# Patient Record
Sex: Female | Born: 1935 | Race: White | Hispanic: No | Smoking: Never smoker
Health system: Southern US, Community
[De-identification: ages and names within clinical notes are randomized; demographics above are authoritative.]

## PROBLEM LIST (undated history)

## (undated) DIAGNOSIS — R609 Edema, unspecified: Secondary | ICD-10-CM

## (undated) DIAGNOSIS — E785 Hyperlipidemia, unspecified: Secondary | ICD-10-CM

## (undated) DIAGNOSIS — Z66 Do not resuscitate: Secondary | ICD-10-CM

## (undated) DIAGNOSIS — C49A3 Gastrointestinal stromal tumor of small intestine: Secondary | ICD-10-CM

## (undated) DIAGNOSIS — M79604 Pain in right leg: Secondary | ICD-10-CM

## (undated) DIAGNOSIS — F0280 Dementia in other diseases classified elsewhere without behavioral disturbance: Secondary | ICD-10-CM

## (undated) DIAGNOSIS — G459 Transient cerebral ischemic attack, unspecified: Secondary | ICD-10-CM

## (undated) DIAGNOSIS — F0390 Unspecified dementia without behavioral disturbance: Secondary | ICD-10-CM

## (undated) DIAGNOSIS — T1491XA Suicide attempt, initial encounter: Secondary | ICD-10-CM

## (undated) DIAGNOSIS — C801 Malignant (primary) neoplasm, unspecified: Secondary | ICD-10-CM

## (undated) DIAGNOSIS — F329 Major depressive disorder, single episode, unspecified: Secondary | ICD-10-CM

## (undated) DIAGNOSIS — I10 Essential (primary) hypertension: Secondary | ICD-10-CM

## (undated) DIAGNOSIS — G43909 Migraine, unspecified, not intractable, without status migrainosus: Secondary | ICD-10-CM

## (undated) DIAGNOSIS — I951 Orthostatic hypotension: Secondary | ICD-10-CM

## (undated) DIAGNOSIS — IMO0002 Reserved for concepts with insufficient information to code with codable children: Secondary | ICD-10-CM

## (undated) DIAGNOSIS — Z87440 Personal history of urinary (tract) infections: Secondary | ICD-10-CM

## (undated) DIAGNOSIS — R9430 Abnormal result of cardiovascular function study, unspecified: Secondary | ICD-10-CM

## (undated) DIAGNOSIS — G309 Alzheimer's disease, unspecified: Secondary | ICD-10-CM

## (undated) DIAGNOSIS — M1990 Unspecified osteoarthritis, unspecified site: Secondary | ICD-10-CM

## (undated) DIAGNOSIS — K922 Gastrointestinal hemorrhage, unspecified: Secondary | ICD-10-CM

## (undated) DIAGNOSIS — I2510 Atherosclerotic heart disease of native coronary artery without angina pectoris: Secondary | ICD-10-CM

## (undated) DIAGNOSIS — I5189 Other ill-defined heart diseases: Secondary | ICD-10-CM

## (undated) DIAGNOSIS — M79605 Pain in left leg: Secondary | ICD-10-CM

## (undated) HISTORY — DX: Suicide attempt, initial encounter: T14.91XA

## (undated) HISTORY — DX: Dementia in other diseases classified elsewhere, unspecified severity, without behavioral disturbance, psychotic disturbance, mood disturbance, and anxiety: F02.80

## (undated) HISTORY — DX: Unspecified dementia, unspecified severity, without behavioral disturbance, psychotic disturbance, mood disturbance, and anxiety: F03.90

## (undated) HISTORY — PX: ABDOMINAL HYSTERECTOMY: SHX81

## (undated) HISTORY — DX: Gastrointestinal hemorrhage, unspecified: K92.2

## (undated) HISTORY — DX: Hyperlipidemia, unspecified: E78.5

## (undated) HISTORY — DX: Edema, unspecified: R60.9

## (undated) HISTORY — DX: Orthostatic hypotension: I95.1

## (undated) HISTORY — DX: Major depressive disorder, single episode, unspecified: F32.9

## (undated) HISTORY — DX: Reserved for concepts with insufficient information to code with codable children: IMO0002

## (undated) HISTORY — DX: Abnormal result of cardiovascular function study, unspecified: R94.30

## (undated) HISTORY — DX: Unspecified osteoarthritis, unspecified site: M19.90

## (undated) HISTORY — DX: Alzheimer's disease, unspecified: G30.9

## (undated) HISTORY — DX: Migraine, unspecified, not intractable, without status migrainosus: G43.909

## (undated) HISTORY — DX: Transient cerebral ischemic attack, unspecified: G45.9

## (undated) HISTORY — DX: Do not resuscitate: Z66

## (undated) HISTORY — DX: Pain in right leg: M79.604

## (undated) HISTORY — DX: Gastrointestinal stromal tumor of small intestine: C49.A3

## (undated) HISTORY — PX: TONSILLECTOMY: SUR1361

## (undated) HISTORY — DX: Atherosclerotic heart disease of native coronary artery without angina pectoris: I25.10

## (undated) HISTORY — DX: Other ill-defined heart diseases: I51.89

## (undated) HISTORY — DX: Pain in left leg: M79.605

---

## 1983-06-22 DIAGNOSIS — T1491XA Suicide attempt, initial encounter: Secondary | ICD-10-CM

## 1983-06-22 HISTORY — DX: Suicide attempt, initial encounter: T14.91XA

## 1997-09-05 ENCOUNTER — Other Ambulatory Visit: Admission: RE | Admit: 1997-09-05 | Discharge: 1997-09-05 | Payer: Self-pay | Admitting: Gynecology

## 2000-09-20 ENCOUNTER — Encounter: Admission: RE | Admit: 2000-09-20 | Discharge: 2000-09-20 | Payer: Self-pay | Admitting: Family Medicine

## 2000-12-29 HISTORY — PX: DIAGNOSTIC MAMMOGRAM: HXRAD719

## 2008-06-24 ENCOUNTER — Ambulatory Visit (HOSPITAL_COMMUNITY): Admission: RE | Admit: 2008-06-24 | Discharge: 2008-06-24 | Payer: Self-pay | Admitting: Family Medicine

## 2008-07-31 ENCOUNTER — Ambulatory Visit: Payer: Self-pay | Admitting: Diagnostic Radiology

## 2008-07-31 ENCOUNTER — Emergency Department (HOSPITAL_BASED_OUTPATIENT_CLINIC_OR_DEPARTMENT_OTHER): Admission: EM | Admit: 2008-07-31 | Discharge: 2008-08-01 | Payer: Self-pay | Admitting: Emergency Medicine

## 2008-08-01 ENCOUNTER — Ambulatory Visit: Payer: Self-pay | Admitting: Diagnostic Radiology

## 2008-11-20 ENCOUNTER — Ambulatory Visit: Payer: Self-pay | Admitting: Diagnostic Radiology

## 2008-11-20 ENCOUNTER — Encounter: Payer: Self-pay | Admitting: Cardiology

## 2008-11-20 ENCOUNTER — Emergency Department (HOSPITAL_BASED_OUTPATIENT_CLINIC_OR_DEPARTMENT_OTHER): Admission: EM | Admit: 2008-11-20 | Discharge: 2008-11-20 | Payer: Self-pay | Admitting: Emergency Medicine

## 2008-11-21 DIAGNOSIS — D649 Anemia, unspecified: Secondary | ICD-10-CM | POA: Insufficient documentation

## 2008-12-09 ENCOUNTER — Encounter: Admission: RE | Admit: 2008-12-09 | Discharge: 2008-12-09 | Payer: Self-pay | Admitting: Family Medicine

## 2009-01-27 DIAGNOSIS — E559 Vitamin D deficiency, unspecified: Secondary | ICD-10-CM | POA: Insufficient documentation

## 2009-04-08 DIAGNOSIS — F411 Generalized anxiety disorder: Secondary | ICD-10-CM | POA: Insufficient documentation

## 2009-05-22 DIAGNOSIS — M2550 Pain in unspecified joint: Secondary | ICD-10-CM | POA: Insufficient documentation

## 2009-05-22 DIAGNOSIS — F3289 Other specified depressive episodes: Secondary | ICD-10-CM | POA: Insufficient documentation

## 2009-05-22 DIAGNOSIS — R413 Other amnesia: Secondary | ICD-10-CM | POA: Insufficient documentation

## 2009-05-22 DIAGNOSIS — G2581 Restless legs syndrome: Secondary | ICD-10-CM | POA: Insufficient documentation

## 2009-05-22 DIAGNOSIS — F329 Major depressive disorder, single episode, unspecified: Secondary | ICD-10-CM

## 2010-08-08 ENCOUNTER — Encounter: Payer: Self-pay | Admitting: Cardiology

## 2010-08-08 ENCOUNTER — Encounter: Payer: Self-pay | Admitting: Physician Assistant

## 2010-08-09 DIAGNOSIS — R079 Chest pain, unspecified: Secondary | ICD-10-CM

## 2010-08-11 ENCOUNTER — Encounter: Payer: Self-pay | Admitting: Cardiology

## 2010-08-11 DIAGNOSIS — R079 Chest pain, unspecified: Secondary | ICD-10-CM

## 2010-08-12 ENCOUNTER — Encounter: Payer: Self-pay | Admitting: Cardiology

## 2010-08-13 ENCOUNTER — Encounter: Payer: Self-pay | Admitting: Cardiology

## 2010-08-14 ENCOUNTER — Encounter: Payer: Self-pay | Admitting: Cardiology

## 2010-08-24 ENCOUNTER — Encounter: Payer: Self-pay | Admitting: Cardiology

## 2010-08-24 ENCOUNTER — Encounter (INDEPENDENT_AMBULATORY_CARE_PROVIDER_SITE_OTHER): Payer: Medicare Other | Admitting: Cardiology

## 2010-08-24 DIAGNOSIS — R072 Precordial pain: Secondary | ICD-10-CM

## 2010-09-01 NOTE — Medication Information (Signed)
Summary: [facility] D/C MEDICATION SHEET ORDER  [facility] D/C MEDICATION SHEET ORDER   Imported By: Zachary George 08/24/2010 09:35:03  _____________________________________________________________________  External Attachment:    Type:   Image     Comment:   External Document

## 2010-09-01 NOTE — Miscellaneous (Signed)
Summary: Directives and Consents/  POWER OF ATTORNEY  Directives and Consents/  POWER OF ATTORNEY   Imported By: Dorise Hiss 08/24/2010 15:22:20  _____________________________________________________________________  External Attachment:    Type:   Image     Comment:   External Document

## 2010-09-01 NOTE — Letter (Signed)
Summary: [facility] D/C DR. Wilburt Finlay  [facility] D/C DR. Wilburt Finlay   Imported By: Zachary George 08/24/2010 08:31:12  _____________________________________________________________________  External Attachment:    Type:   Image     Comment:   External Document

## 2010-09-01 NOTE — Letter (Signed)
Summary: Internal Other/  PATIENT HISTORY FORM  Internal Other/  PATIENT HISTORY FORM   Imported By: Dorise Hiss 08/24/2010 16:44:20  _____________________________________________________________________  External Attachment:    Type:   Image     Comment:   External Document

## 2010-09-01 NOTE — Miscellaneous (Signed)
  Clinical Lists Changes  Problems: Removed problem of CORONARY ATHEROSCLEROSIS NATIVE CORONARY ARTERY (ICD-414.01) Removed problem of PRECORDIAL PAIN (ICD-786.51) Removed problem of OTHER AND UNSPECIFIED ANGINA PECTORIS (ICD-413.9) Removed problem of UNSPECIFIED HEART DISEASE (ICD-429.9) Removed problem of AV BLOCK, 1ST DEGREE (ICD-426.11) Added new problem of CHEST PAIN (ICD-786.50) Added new problem of DYSLIPIDEMIA (ICD-272.4) Observations: Added new observation of PAST MED HX: Chest pain  hospital February, 2012.... nuclear... possible anteroseptal and inferoseptal ischemia... technically difficult study.... medical therapy CAD  probable... nuclear scan.... February, 2012 are Hyperlipidemia Orthostatic Hypotension Migraines for approx 25 yrs Dementia EF 55%  echo.... February, 2012 Diastolic dysfunction   echo... February, 2012 TIA ????  carotid Doppler August 11, 2010... no significant abnormalities (08/24/2010 14:21)       Past History:  Past Medical History: Chest pain  hospital February, 2012.... nuclear... possible anteroseptal and inferoseptal ischemia... technically difficult study.... medical therapy CAD  probable... nuclear scan.... February, 2012 are Hyperlipidemia Orthostatic Hypotension Migraines for approx 25 yrs Dementia EF 55%  echo.... February, 2012 Diastolic dysfunction   echo... February, 2012 TIA ????  carotid Doppler August 11, 2010... no significant abnormalities

## 2010-09-01 NOTE — Consult Note (Signed)
Summary: CARDIOLOGY CONSULT/[facility]  CARDIOLOGY CONSULT/[facility]   Imported By: Zachary George 08/24/2010 08:37:16  _____________________________________________________________________  External Attachment:    Type:   Image     Comment:   External Document

## 2010-09-01 NOTE — Assessment & Plan Note (Signed)
Summary: d/c [facility]  eval of CP Seen in consult by Dr. Delia Chimes ...   Visit Type:  post hospital visit Primary Provider:  Ashish Shah,MD  CC:  chest pain.  History of Present Illness: The patient is seen post hospitalization.  She presented with abdominal pain and some chest discomfort.  She has significant dementia.  Cardiac enzymes were normal.  LV wall motion was normal.  Her nuclear scan raised the possibility of some anteroseptal and inferoseptal ischemia.  The study was technically difficult.  It is assumed that she has coronary disease but there is no proof.  Since her hospitalization she had a GI bleed.  Colonoscopy was incomplete.  We know that she has some hemorrhoids.  She needs a more complete colonoscopy.  Her symptoms are very difficult to assess.  The patient has mood swings., her family is trying very hard to provide appropriate care.  They do not want excessive studies, appropriately.  I do not get any significant history of exertional angina at this time.  Preventive Screening-Counseling & Management  Alcohol-Tobacco     Smoking Status: never  Current Medications (verified): 1)  Nitrostat 0.4 Mg Subl (Nitroglycerin) .Marland Kitchen.. 1 Under Tongue @ Onset of Chest Pain,you May Repeat Every 5 Min. For Up To 3 Doses. If Chest Pain Continues @3rd  Dose,call 911 & Proceed To Er 2)  Imdur 60 Mg Xr24h-Tab (Isosorbide Mononitrate) .... Take 1 Tablet By Mouth Once A Day 3)  Pravastatin Sodium 20 Mg Tabs (Pravastatin Sodium) .... Take 1 Tablet By Mouth Once A Day 4)  Aspir-Low 81 Mg Tbec (Aspirin) .... Take 1 Tablet By Mouth Once A Day 5)  Tramadol Hcl 50 Mg Tabs (Tramadol Hcl) .... Take 1 Tablet By Mouth Three Times A Day 6)  Anusol-Hc 25 Mg Supp (Hydrocortisone Acetate) .... Insert On Per Rectum Two Times A Day 7)  Tylenol Extra Strength 500 Mg Tabs (Acetaminophen) .... Take 2 By Mouth Every 4 Hours As Needed  Allergies (verified): No Known Drug Allergies  Comments:  Nurse/Medical  Assistant: The patient's medication list and allergies were reviewed with the patient and were updated in the Medication and Allergy Lists.  Past History:  Past Medical History: Chest pain  hospital February, 2012.... nuclear... possible anteroseptal and inferoseptal ischemia... technically difficult study.... medical therapy CAD  probable... nuclear scan.... February, 2012 are Hyperlipidemia Orthostatic Hypotension Migraines for approx 25 yrs Dementia EF 55%  echo.... February, 2012 Diastolic dysfunction   echo... February, 2012 TIA ????  carotid Doppler August 11, 2010... no significant abnormalities GI bleed   February, 2012... incomplete colonoscopy... hemorrhoids... needs complete colonoscopy  Review of Systems       The patient denies fever, chills, headache, sweats, rash, change in vision, change in hearing, chest pain, cough, nausea vomiting, urinary symptoms.  All other systems are reviewed and are negative.  Vital Signs:  Patient profile:   77 year old female Height:      64 inches Weight:      148 pounds BMI:     25.50 Pulse rate:   63 / minute BP sitting:   115 / 65  (left arm) Cuff size:   regular  Vitals Entered By: Carlye Grippe (August 24, 2010 3:48 PM)  Nutrition Counseling: Patient's BMI is greater than 25 and therefore counseled on weight management options.  Physical Exam  General:  patient is stable today. Head:  head is atraumatic. Eyes:  no xanthelasma. Neck:  no jugulovenous distention. Chest Wall:  no  chest wall tenderness. Lungs:  lungs are clear.  Respiratory effort is nonlabored. Heart:  cardiac exam reveals an S1-S2.  No clicks or significant murmurs. Abdomen:  abdomen is soft. Msk:  no musculoskeletal deformities. Extremities:  no peripheral edema. Skin:  no skin rashes. Psych:  patient is oriented to person time and place.   Impression & Recommendations:  Problem # 1:  CHEST PAIN (ICD-786.50) The patient's chest pain is very  difficult to assess.  She's not having any at this time.  She does get a cramping sensation under her left rib cage anteriorly.  We are presuming coronary disease but it is not proven.  She has normal left ventricular function.  Her cardiac enzymes in the hospital were normal.  Her nuclear scan is abnormal but technically difficult.  Considering her mental status is most appropriate not proceed with further cardiac testing at this time.  If she were to have recurrent significant symptoms that we felt might be ischemic further evaluation could be considered.  Problem # 2:  HYPERLIPIDEMIA (ICD-272.4) The patient is on Pravachol.  She has long-term leg cramps.  Her daughter has asked that the Pravachol should be continued.  I made it clear that he should be continued unless we feel that it is changed since the addition of Pravachol.  Problem # 3:  DEMENTIA (ICD-294.8) The patient's dementia is significant.  At this point it is very important that we are prudent and do only the studies that we feel are clinically necessary.  Problem # 4:  GI BLEEDING (ICD-578.9) Renal the patient has hemorrhoids.  However she needs a complete colonoscopy.  I feel that colonoscopy should be done.  I cannot predict her overall cardiac status but she appears to be stable.  I have explained this carefully to the patient's daughter in person.  Patient Instructions: 1)  Follow up with Dr. Myrtis Ser on  [facility], Oct 22, 2010 AT 1PM. 2)  Your physician recommends that you continue on your current medications as directed. Please refer to the Current Medication list given to you today.

## 2010-09-11 ENCOUNTER — Telehealth: Payer: Self-pay | Admitting: *Deleted

## 2010-09-11 NOTE — Telephone Encounter (Signed)
Pt's dgt, Valerie Chen, left message on voice mail stating pt is to have a colonoscopy on the 29th and just found the paperwork from the MD in [place] who is doing the procedure. Pt was to stop meds 7 days before colonoscopy but she took them today. She wants to know if it is okay that she will be holding these for 6 days instead of 7.  Pt's dgt notified to contact MD performing the colonoscopy to ask this question. Pt's dgt verbalized understanding.

## 2010-09-22 ENCOUNTER — Ambulatory Visit (HOSPITAL_COMMUNITY): Payer: Medicare Other | Admitting: Psychology

## 2010-09-28 LAB — CBC
MCHC: 35.4 g/dL (ref 30.0–36.0)
MCV: 91.2 fL (ref 78.0–100.0)
Platelets: 152 10*3/uL (ref 150–400)
RBC: 4.6 MIL/uL (ref 3.87–5.11)

## 2010-09-28 LAB — DIFFERENTIAL
Eosinophils Absolute: 0.1 10*3/uL (ref 0.0–0.7)
Eosinophils Relative: 1 % (ref 0–5)
Lymphocytes Relative: 41 % (ref 12–46)
Lymphs Abs: 2.1 10*3/uL (ref 0.7–4.0)
Monocytes Absolute: 0.4 10*3/uL (ref 0.1–1.0)
Monocytes Relative: 8 % (ref 3–12)

## 2010-09-28 LAB — COMPREHENSIVE METABOLIC PANEL
ALT: 29 U/L (ref 0–35)
AST: 36 U/L (ref 0–37)
Albumin: 4 g/dL (ref 3.5–5.2)
CO2: 30 mEq/L (ref 19–32)
Calcium: 9.3 mg/dL (ref 8.4–10.5)
Creatinine, Ser: 0.7 mg/dL (ref 0.4–1.2)
GFR calc Af Amer: >60 mL/min (ref >60)
GFR calc non Af Amer: >60 mL/min (ref >60)
Sodium: 142 mEq/L (ref 135–145)
Total Protein: 6.9 g/dL (ref 6.0–8.3)

## 2010-09-28 LAB — LIPASE, BLOOD: Lipase: 85 U/L (ref 23–300)

## 2010-09-28 LAB — POCT CARDIAC MARKERS
CKMB, poc: 1.3 ng/mL (ref 1.0–8.0)
Troponin i, poc: <0.05 ng/mL (ref 0.00–0.09)

## 2010-10-06 LAB — DIFFERENTIAL
Basophils Absolute: 0 10*3/uL (ref 0.0–0.1)
Basophils Relative: 1 % (ref 0–1)
Eosinophils Absolute: 0 10*3/uL (ref 0.0–0.7)
Monocytes Absolute: 0.3 10*3/uL (ref 0.1–1.0)
Monocytes Relative: 7 % (ref 3–12)
Neutrophils Relative %: 49 % (ref 43–77)

## 2010-10-06 LAB — POCT CARDIAC MARKERS
CKMB, poc: 1.2 ng/mL (ref 1.0–8.0)
Troponin i, poc: <0.05 ng/mL (ref 0.00–0.09)

## 2010-10-06 LAB — URINE MICROSCOPIC-ADD ON

## 2010-10-06 LAB — COMPREHENSIVE METABOLIC PANEL
ALT: 28 U/L (ref 0–35)
Albumin: 4.3 g/dL (ref 3.5–5.2)
Alkaline Phosphatase: 74 U/L (ref 39–117)
BUN: 17 mg/dL (ref 6–23)
Chloride: 103 mEq/L (ref 96–112)
Glucose, Bld: 80 mg/dL (ref 70–99)
Potassium: 4 mEq/L (ref 3.5–5.1)
Sodium: 140 mEq/L (ref 135–145)
Total Bilirubin: 0.5 mg/dL (ref 0.3–1.2)
Total Protein: 7.2 g/dL (ref 6.0–8.3)

## 2010-10-06 LAB — URINALYSIS, ROUTINE W REFLEX MICROSCOPIC
Nitrite: NEGATIVE
Specific Gravity, Urine: 1.027 (ref 1.005–1.030)
Urobilinogen, UA: 0.2 mg/dL (ref 0.0–1.0)
pH: 7.5 (ref 5.0–8.0)

## 2010-10-06 LAB — CBC
HCT: 45.7 % (ref 36.0–46.0)
Hemoglobin: 15.3 g/dL — ABNORMAL HIGH (ref 12.0–15.0)
RBC: 5.04 MIL/uL (ref 3.87–5.11)
RDW: 11.8 % (ref 11.5–15.5)
WBC: 4.7 10*3/uL (ref 4.0–10.5)

## 2010-10-20 ENCOUNTER — Encounter: Payer: Self-pay | Admitting: Cardiology

## 2010-10-20 DIAGNOSIS — I5189 Other ill-defined heart diseases: Secondary | ICD-10-CM | POA: Insufficient documentation

## 2010-10-20 DIAGNOSIS — G459 Transient cerebral ischemic attack, unspecified: Secondary | ICD-10-CM | POA: Insufficient documentation

## 2010-10-20 DIAGNOSIS — E785 Hyperlipidemia, unspecified: Secondary | ICD-10-CM | POA: Insufficient documentation

## 2010-10-20 DIAGNOSIS — R079 Chest pain, unspecified: Secondary | ICD-10-CM | POA: Insufficient documentation

## 2010-10-20 DIAGNOSIS — G43909 Migraine, unspecified, not intractable, without status migrainosus: Secondary | ICD-10-CM | POA: Insufficient documentation

## 2010-10-20 DIAGNOSIS — F068 Other specified mental disorders due to known physiological condition: Secondary | ICD-10-CM | POA: Insufficient documentation

## 2010-10-20 DIAGNOSIS — I951 Orthostatic hypotension: Secondary | ICD-10-CM | POA: Insufficient documentation

## 2010-10-20 DIAGNOSIS — Z8719 Personal history of other diseases of the digestive system: Secondary | ICD-10-CM | POA: Insufficient documentation

## 2010-10-22 ENCOUNTER — Encounter: Payer: Self-pay | Admitting: Cardiology

## 2010-10-22 ENCOUNTER — Ambulatory Visit: Payer: Medicare Other | Admitting: Cardiology

## 2010-11-19 ENCOUNTER — Encounter: Payer: Self-pay | Admitting: Cardiology

## 2010-11-19 DIAGNOSIS — I2510 Atherosclerotic heart disease of native coronary artery without angina pectoris: Secondary | ICD-10-CM | POA: Insufficient documentation

## 2010-11-20 ENCOUNTER — Encounter: Payer: Self-pay | Admitting: Cardiology

## 2010-11-23 ENCOUNTER — Ambulatory Visit (INDEPENDENT_AMBULATORY_CARE_PROVIDER_SITE_OTHER): Payer: Medicare Other | Admitting: Cardiology

## 2010-11-23 ENCOUNTER — Encounter: Payer: Self-pay | Admitting: Cardiology

## 2010-11-23 DIAGNOSIS — K922 Gastrointestinal hemorrhage, unspecified: Secondary | ICD-10-CM

## 2010-11-23 DIAGNOSIS — I2510 Atherosclerotic heart disease of native coronary artery without angina pectoris: Secondary | ICD-10-CM

## 2010-11-23 DIAGNOSIS — F0390 Unspecified dementia without behavioral disturbance: Secondary | ICD-10-CM

## 2010-11-23 NOTE — Patient Instructions (Signed)
Your physician wants you to follow-up in: 3 months. You will receive a reminder letter in the mail two months in advance. If you don't receive a letter, please call our office to schedule the follow-up appointment.   

## 2010-11-23 NOTE — Assessment & Plan Note (Signed)
Overall her mental status is significantly improved.  Her daughter knows best how aggressive we should be about her other medical problems.

## 2010-11-23 NOTE — Assessment & Plan Note (Signed)
The patient has had a colonoscopy.  She had one benign polyp.  Clinically there is been no recurrent bleeding.  I am hopeful that she will agree to an upper endoscopy at some point so that we can be sure that her GI workup is complete chest in case she needs any type of stenting in the future and the need for dual antiplatelet therapy

## 2010-11-23 NOTE — Assessment & Plan Note (Signed)
The patient has presumed coronary disease.  There was a nuclear study that was very difficult.  We really do not know for sure the status of her coronaries.  She has good left ventricular function.  At this point I'm not convinced that her symptoms represent unstable angina.  Considering all factors we will continue to follow her.  I explained to her daughter that if we are concerned about significant pain we will probably have to proceed with cardiac catheterization as her last nuclear scan was very difficult to interpret.

## 2010-11-23 NOTE — Progress Notes (Signed)
HPI Patient is seen today for followup chest discomfort.  I saw her last August 24, 2010.  She is here with her daughter today who is very attentive.  The patient has had some very good treatment for her mental status.  This is improving significantly.  She is now in an assisted living area.  She wears a special brace to be sure that the staff knows where she is.  She may be able to take this off soon.  She does have some intermittent chest discomfort.  However it does not sound like unstable angina.  She had had some GI bleeding.  She has had a colonoscopy since the last visit.  There was one benign polyp.  She has not had an endoscopy yet. No Known Allergies  Current Outpatient Prescriptions  Medication Sig Dispense Refill  . aspirin (ASPIR-LOW) 81 MG EC tablet Take 81 mg by mouth daily.        . cyanocobalamin ([employer] VITAMIN B12) 1000 MCG tablet Take 100 mcg by mouth daily.        Marland Kitchen donepezil (ARICEPT) 5 MG tablet Take 5 mg by mouth at bedtime.        . isosorbide mononitrate (IMDUR) 60 MG 24 hr tablet Take 60 mg by mouth daily.        . nitroGLYCERIN (NITROSTAT) 0.4 MG SL tablet Place 0.4 mg under the tongue every 5 (five) minutes as needed. May repeat up to 3 times.  If after 3rd dose chest pain persists, call 911 and proceed to ER.       . pravastatin (PRAVACHOL) 20 MG tablet Take 20 mg by mouth daily.        . QUEtiapine (SEROQUEL) 25 MG tablet Take 12.5 mg by mouth at bedtime.        . traMADol (ULTRAM) 50 MG tablet Take 50 mg by mouth 3 (three) times daily.        Marland Kitchen DISCONTD: acetaminophen (TYLENOL) 500 MG tablet Take 1,000 mg by mouth every 4 (four) hours as needed.        Marland Kitchen DISCONTD: hydrocortisone (ANUSOL-HC) 25 MG suppository Place 25 mg rectally 2 (two) times daily.          History   Social History  . Marital Status: Widowed    Spouse Name: N/A    Number of Children: 3  . Years of Education: N/A   Occupational History  . RETIRED    Social History Main Topics  . Smoking  status: Never Smoker   . Smokeless tobacco: Not on file  . Alcohol Use: No  . Drug Use: No  . Sexually Active: Not on file   Other Topics Concern  . Not on file   Social History Narrative   2 husbands3 children    Family History  Problem Relation Age of Onset  . Heart attack Father     several heart attacks  . Hypertension Daughter     Past Medical History  Diagnosis Date  . Chest pain     Hospital February, 2012, nuclear, possible anteroseptal and inferoseptal ischemia, technically difficult, medical therapy  . Dyslipidemia   . Orthostatic hypotension   . Migraines     27 year  . Dementia     Significant  . Ejection fraction     EF 55%, echo, February, 2012  . Diastolic dysfunction     Echo, February, 2012  . TIA (transient ischemic attack)        ???question of a  TIA in February, 2012, carotid Doppler, August 11, 2010 no significant abnormalities  . GI bleed     February, 2012 incomplete colonoscopy, hemorrhoids, needs complete colonoscopy  . CAD (coronary artery disease)     Probable CAD, nuclear February, 2012, possible anteroseptal and inferoseptal ischemia but the study was technically difficult  . Hyperlipidemia     Past Surgical History  Procedure Date  . Abdominal hysterectomy   . Diagnostic mammogram 12/29/2000    Mammogram, suspicious lesions -BX pending    ROS  Patient denies fever, chills, headache, sweats, rash, change in vision, change in hearing, cough, nausea vomiting, urinary symptoms.  All other systems are reviewed and are negative  PHYSICAL EXAM Patient is oriented to person time and place.  Affect is normal.  She is here with her daughter.  Head is atraumatic.  There is no jugular venous distention.  Lungs are clear.  Respiratory effort is nonlabored.  Cardiac exam reveals an S1-S2.  There are no clicks or significant murmurs.  The abdomen is soft.  There is no peripheral edema. Filed Vitals:   11/23/10 1153  BP: 130/80  Pulse: 60    Height: 5\' 4"  (1.626 m)  Weight: 155 lb (70.308 kg)    EKG Is done today and reviewed by me.    EKG is normal.  ASSESSMENT & PLAN

## 2010-11-25 ENCOUNTER — Telehealth: Payer: Self-pay | Admitting: *Deleted

## 2010-11-25 NOTE — Telephone Encounter (Signed)
See fax for details on message/ clarification sent to their facility after medlist corrections made.

## 2011-03-03 ENCOUNTER — Emergency Department (INDEPENDENT_AMBULATORY_CARE_PROVIDER_SITE_OTHER): Payer: Medicare Other

## 2011-03-03 ENCOUNTER — Encounter (HOSPITAL_BASED_OUTPATIENT_CLINIC_OR_DEPARTMENT_OTHER): Payer: Self-pay | Admitting: Family Medicine

## 2011-03-03 ENCOUNTER — Other Ambulatory Visit: Payer: Self-pay

## 2011-03-03 ENCOUNTER — Emergency Department (HOSPITAL_BASED_OUTPATIENT_CLINIC_OR_DEPARTMENT_OTHER)
Admission: EM | Admit: 2011-03-03 | Discharge: 2011-03-03 | Disposition: A | Discharge status: Home / Self Care | Payer: Medicare Other | Attending: Emergency Medicine | Admitting: Emergency Medicine

## 2011-03-03 DIAGNOSIS — S86819A Strain of other muscle(s) and tendon(s) at lower leg level, unspecified leg, initial encounter: Secondary | ICD-10-CM | POA: Insufficient documentation

## 2011-03-03 DIAGNOSIS — Z0389 Encounter for observation for other suspected diseases and conditions ruled out: Secondary | ICD-10-CM

## 2011-03-03 DIAGNOSIS — M2669 Other specified disorders of temporomandibular joint: Secondary | ICD-10-CM

## 2011-03-03 DIAGNOSIS — M25559 Pain in unspecified hip: Secondary | ICD-10-CM | POA: Insufficient documentation

## 2011-03-03 DIAGNOSIS — R51 Headache: Secondary | ICD-10-CM | POA: Insufficient documentation

## 2011-03-03 DIAGNOSIS — W19XXXA Unspecified fall, initial encounter: Secondary | ICD-10-CM

## 2011-03-03 DIAGNOSIS — S838X9A Sprain of other specified parts of unspecified knee, initial encounter: Secondary | ICD-10-CM | POA: Insufficient documentation

## 2011-03-03 DIAGNOSIS — R079 Chest pain, unspecified: Secondary | ICD-10-CM

## 2011-03-03 DIAGNOSIS — F0390 Unspecified dementia without behavioral disturbance: Secondary | ICD-10-CM | POA: Insufficient documentation

## 2011-03-03 DIAGNOSIS — W010XXA Fall on same level from slipping, tripping and stumbling without subsequent striking against object, initial encounter: Secondary | ICD-10-CM

## 2011-03-03 DIAGNOSIS — M25569 Pain in unspecified knee: Secondary | ICD-10-CM

## 2011-03-03 DIAGNOSIS — M1120 Other chondrocalcinosis, unspecified site: Secondary | ICD-10-CM

## 2011-03-03 DIAGNOSIS — Y92009 Unspecified place in unspecified non-institutional (private) residence as the place of occurrence of the external cause: Secondary | ICD-10-CM | POA: Insufficient documentation

## 2011-03-03 DIAGNOSIS — S86919A Strain of unspecified muscle(s) and tendon(s) at lower leg level, unspecified leg, initial encounter: Secondary | ICD-10-CM

## 2011-03-03 DIAGNOSIS — I2510 Atherosclerotic heart disease of native coronary artery without angina pectoris: Secondary | ICD-10-CM | POA: Insufficient documentation

## 2011-03-03 DIAGNOSIS — E785 Hyperlipidemia, unspecified: Secondary | ICD-10-CM | POA: Insufficient documentation

## 2011-03-03 DIAGNOSIS — Z8679 Personal history of other diseases of the circulatory system: Secondary | ICD-10-CM | POA: Insufficient documentation

## 2011-03-03 DIAGNOSIS — Z79899 Other long term (current) drug therapy: Secondary | ICD-10-CM | POA: Insufficient documentation

## 2011-03-03 LAB — CBC
Hemoglobin: 14.9 g/dL (ref 12.0–15.0)
MCH: 30.6 pg (ref 26.0–34.0)
MCV: 88.1 fL (ref 78.0–100.0)
Platelets: 150 10*3/uL (ref 150–400)
RBC: 4.87 MIL/uL (ref 3.87–5.11)
WBC: 6.2 10*3/uL (ref 4.0–10.5)

## 2011-03-03 LAB — COMPREHENSIVE METABOLIC PANEL
ALT: 25 U/L (ref 0–35)
Alkaline Phosphatase: 65 U/L (ref 39–117)
BUN: 21 mg/dL (ref 6–23)
CO2: 27 mEq/L (ref 19–32)
Calcium: 9.6 mg/dL (ref 8.4–10.5)
GFR calc Af Amer: >60 mL/min (ref >60)
GFR calc non Af Amer: >60 mL/min (ref >60)
Glucose, Bld: 92 mg/dL (ref 70–99)
Potassium: 3.9 mEq/L (ref 3.5–5.1)
Sodium: 141 mEq/L (ref 135–145)

## 2011-03-03 LAB — DIFFERENTIAL
Eosinophils Absolute: 0 10*3/uL (ref 0.0–0.7)
Eosinophils Relative: 0 % (ref 0–5)
Lymphocytes Relative: 26 % (ref 12–46)
Lymphs Abs: 1.6 10*3/uL (ref 0.7–4.0)
Monocytes Relative: 9 % (ref 3–12)

## 2011-03-03 LAB — URINALYSIS, ROUTINE W REFLEX MICROSCOPIC
Bilirubin Urine: NEGATIVE
Ketones, ur: 15 mg/dL — AB
Leukocytes, UA: NEGATIVE
Nitrite: NEGATIVE
Urobilinogen, UA: 0.2 mg/dL (ref 0.0–1.0)

## 2011-03-03 MED ORDER — HYDROCODONE-ACETAMINOPHEN 5-325 MG PO TABS: 1.0000 | ORAL_TABLET | Freq: Four times a day (QID) | ORAL | Status: AC | PRN

## 2011-03-03 MED ORDER — KETOROLAC TROMETHAMINE 60 MG/2ML IM SOLN: 60.0000 mg | Freq: Once | INTRAMUSCULAR | Status: DC

## 2011-03-03 MED ORDER — KETOROLAC TROMETHAMINE 30 MG/ML IJ SOLN
30.0000 mg | Freq: Once | INTRAMUSCULAR | Status: AC
Start: 2011-03-03 — End: 2011-03-03
  Administered 2011-03-03: 30 mg via INTRAVENOUS
  Filled 2011-03-03: qty 1

## 2011-03-03 MED ORDER — HYDROCODONE-ACETAMINOPHEN 5-325 MG PO TABS
1.0000 | ORAL_TABLET | Freq: Once | ORAL | Status: AC
  Administered 2011-03-03: 1 via ORAL
  Filled 2011-03-03: qty 1

## 2011-03-03 NOTE — ED Notes (Addendum)
Pt lives at [facility] assisted living. Pt fell approx 5am this morning on the grass outside the facility during a fire alarm. Pt c/o right thigh pain and pain behind right knee. Daughter with pt. Pt denies LOC and sts she was wearing "slick shoes". Pt was able to get herself to standing and back to room.

## 2011-03-03 NOTE — ED Provider Notes (Signed)
History     CSN: [id number] Arrival date & time: 03/03/2011  3:59 PM  Chief Complaint  Patient presents with  . Fall   HPI Patient is an 77 year old female residing in an assisted living facility. Reports this morning at 4 AM was a fire alarm at her facility. States she went outside with her small dog. After the fire alarm she began to walk inside. States somehow she fell. Reports she was unable to get up due to pain in her right knee. The pain has worsened since injury. States unable to walk and pain radiates up right posterolateral thigh. Denies LOC or head injury. Daughter is with patient and states yesterday had an episode of chest pressure. Reports a significant history of cardiac disease but cardiologist will not do a cath, Just treats CP symptomatic. Denies headache head injury, numbness, tingling, weakness, neck pain, chest pain, shortness of breath, nausea, vomiting, dysuria, abdominal pain.  Past Medical History  Diagnosis Date  . Chest pain     Hospital February, 2012, nuclear, possible anteroseptal and inferoseptal ischemia, technically difficult, medical therapy  . Dyslipidemia   . Orthostatic hypotension   . Migraines     27 year  . Dementia     Significant  . Ejection fraction     EF 55%, echo, February, 2012  . Diastolic dysfunction     Echo, February, 2012  . TIA (transient ischemic attack)        ???question of a TIA in February, 2012, carotid Doppler, August 11, 2010 no significant abnormalities  . GI bleed     February, 2012 incomplete colonoscopy, hemorrhoids, needs complete colonoscopy  . CAD (coronary artery disease)     Probable CAD, nuclear February, 2012, possible anteroseptal and inferoseptal ischemia but the study was technically difficult  . Hyperlipidemia     Past Surgical History  Procedure Date  . Abdominal hysterectomy   . Diagnostic mammogram 12/29/2000    Mammogram, suspicious lesions -BX pending    Family History  Problem Relation Age of  Onset  . Heart attack Father     several heart attacks  . Hypertension Daughter     History  Substance Use Topics  . Smoking status: Never Smoker   . Smokeless tobacco: Not on file  . Alcohol Use: No    OB History    Grav Para Term Preterm Abortions TAB SAB Ect Mult Living                  Review of Systems  Constitutional: Negative for fever and chills.  HENT: Negative for neck pain.   Respiratory: Negative for chest tightness and shortness of breath.   Cardiovascular: Negative for chest pain.  Gastrointestinal: Negative for nausea, vomiting and diarrhea.  Genitourinary: Negative for dysuria, urgency, frequency, hematuria, flank pain, vaginal discharge and vaginal pain.  Musculoskeletal: Negative for back pain.       Right knee pain  All other systems reviewed and are negative.    Physical Exam  BP 149/72  Pulse 67  Temp(Src) 98.2 F (36.8 C) (Oral)  Resp 18  Ht 5' 4.5" (1.638 m)  Wt 160 lb (72.576 kg)  BMI 27.04 kg/m2  SpO2 95%  Physical Exam  Constitutional: She is oriented to person, place, and time. She appears well-developed and well-nourished.  HENT:  Head: Atraumatic.  Eyes: Conjunctivae and EOM are normal. Pupils are equal, round, and reactive to light.  Neck: Normal range of motion. Neck supple.  Cardiovascular: Normal rate, regular  rhythm and normal heart sounds.  Exam reveals no friction rub.   No murmur heard. Pulmonary/Chest: Effort normal and breath sounds normal. She has no wheezes. She has no rales. She exhibits no tenderness.  Abdominal: Soft. Bowel sounds are normal. She exhibits no distension and no mass. There is no tenderness. There is no rebound and no guarding.  Musculoskeletal: Normal range of motion.       Right knee: She exhibits normal range of motion, no swelling, no effusion, no laceration, no erythema and normal meniscus. tenderness found.       Legs:      Lateral tendon TTP. Full ROM without assistance. Nml strength and  sensation. normal pulses distally. Pelvis stable; hip full range of motion.  Neurological: She is alert and oriented to person, place, and time. Coordination normal.  Skin: Skin is warm and dry. No rash noted. No erythema. No pallor.    ED Course  Procedures  MDM  ED ECG REPORT   Date: 03/03/2011  EKG Time: 7:57 PM  Rate: 64  Rhyt and hm: Sinus Rhythm,  1st degree AV block  Axis: Normal  Intervals:first-degree A-V block    ST&T Change: Normal   Narrative Interpretation: with the exception of first degree AV block normal EKG   Patient has normal imaging and labs. Able to ambulate in the ED. Pain improved with medication. Will DC home with prescription for walker and followup with PCP in the morning             Thomasene Lot, [place] 03/03/11 2006

## 2011-03-03 NOTE — ED Notes (Signed)
Pt able to ambulate with minimal assistance. Pt denies pain at this time. Pt given discharge instructions and prescription. Pt awaiting daughter for ride home.

## 2011-03-04 NOTE — ED Provider Notes (Signed)
Medical screening examination/treatment/procedure(s) were conducted as a shared visit with non-physician practitioner(s) and myself.  I personally evaluated the patient during the encounter  Pt well appearing,no active CP at this time, at her baseline   Joya Gaskins, MD 03/04/11 0028

## 2011-03-12 DIAGNOSIS — F0390 Unspecified dementia without behavioral disturbance: Secondary | ICD-10-CM | POA: Insufficient documentation

## 2011-08-12 DIAGNOSIS — M545 Low back pain, unspecified: Secondary | ICD-10-CM | POA: Insufficient documentation

## 2011-12-09 ENCOUNTER — Other Ambulatory Visit: Payer: Self-pay | Admitting: Cardiology

## 2011-12-09 ENCOUNTER — Encounter: Payer: Self-pay | Admitting: Cardiology

## 2011-12-26 ENCOUNTER — Inpatient Hospital Stay (HOSPITAL_COMMUNITY)
Admission: EM | Admit: 2011-12-26 | Discharge: 2011-12-28 | DRG: 303 | Disposition: A | Discharge status: Home Health | Payer: Medicare Other | Attending: Family Medicine | Admitting: Family Medicine

## 2011-12-26 ENCOUNTER — Emergency Department (HOSPITAL_COMMUNITY): Payer: Medicare Other

## 2011-12-26 DIAGNOSIS — I2510 Atherosclerotic heart disease of native coronary artery without angina pectoris: Principal | ICD-10-CM | POA: Diagnosis present

## 2011-12-26 DIAGNOSIS — R079 Chest pain, unspecified: Secondary | ICD-10-CM | POA: Diagnosis present

## 2011-12-26 DIAGNOSIS — I10 Essential (primary) hypertension: Secondary | ICD-10-CM | POA: Diagnosis present

## 2011-12-26 DIAGNOSIS — Z8673 Personal history of transient ischemic attack (TIA), and cerebral infarction without residual deficits: Secondary | ICD-10-CM

## 2011-12-26 DIAGNOSIS — I209 Angina pectoris, unspecified: Secondary | ICD-10-CM | POA: Diagnosis present

## 2011-12-26 DIAGNOSIS — F068 Other specified mental disorders due to known physiological condition: Secondary | ICD-10-CM | POA: Diagnosis present

## 2011-12-26 DIAGNOSIS — F0390 Unspecified dementia without behavioral disturbance: Secondary | ICD-10-CM | POA: Diagnosis present

## 2011-12-26 DIAGNOSIS — E785 Hyperlipidemia, unspecified: Secondary | ICD-10-CM | POA: Diagnosis present

## 2011-12-26 LAB — CBC WITH DIFFERENTIAL/PLATELET
Basophils Absolute: 0 10*3/uL (ref 0.0–0.1)
Basophils Relative: 0 % (ref 0–1)
Eosinophils Absolute: 0 10*3/uL (ref 0.0–0.7)
Eosinophils Relative: 0 % (ref 0–5)
MCH: 30.8 pg (ref 26.0–34.0)
MCHC: 34.3 g/dL (ref 30.0–36.0)
MCV: 89.7 fL (ref 78.0–100.0)
Platelets: 164 10*3/uL (ref 150–400)
RDW: 12.9 % (ref 11.5–15.5)

## 2011-12-26 LAB — URINALYSIS, ROUTINE W REFLEX MICROSCOPIC
Bilirubin Urine: NEGATIVE
Nitrite: NEGATIVE
Specific Gravity, Urine: 1.009 (ref 1.005–1.030)
pH: 6 (ref 5.0–8.0)

## 2011-12-26 LAB — COMPREHENSIVE METABOLIC PANEL
ALT: 17 U/L (ref 0–35)
Albumin: 3.7 g/dL (ref 3.5–5.2)
Calcium: 9.6 mg/dL (ref 8.4–10.5)
GFR calc Af Amer: 78 mL/min — ABNORMAL LOW (ref >90)
Glucose, Bld: 95 mg/dL (ref 70–99)
Sodium: 140 mEq/L (ref 135–145)
Total Protein: 6.9 g/dL (ref 6.0–8.3)

## 2011-12-26 LAB — URINE MICROSCOPIC-ADD ON

## 2011-12-26 LAB — POCT I-STAT TROPONIN I

## 2011-12-26 MED ORDER — SODIUM CHLORIDE 0.9 % IJ SOLN
3.0000 mL | Freq: Two times a day (BID) | INTRAMUSCULAR | Status: DC
  Administered 2011-12-27: 3 mL via INTRAVENOUS

## 2011-12-26 MED ORDER — QUETIAPINE 12.5 MG HALF TABLET
12.5000 mg | ORAL_TABLET | Freq: Every day | ORAL | Status: DC
  Administered 2011-12-27 (×2): 12.5 mg via ORAL
  Filled 2011-12-26 (×3): qty 1

## 2011-12-26 MED ORDER — GI COCKTAIL ~~LOC~~
30.0000 mL | Freq: Once | ORAL | Status: AC
  Administered 2011-12-27: 30 mL via ORAL
  Filled 2011-12-26: qty 30

## 2011-12-26 MED ORDER — ENOXAPARIN SODIUM 40 MG/0.4ML ~~LOC~~ SOLN
40.0000 mg | SUBCUTANEOUS | Status: DC
  Administered 2011-12-27 – 2011-12-28 (×2): 40 mg via SUBCUTANEOUS
  Filled 2011-12-26 (×2): qty 0.4

## 2011-12-26 MED ORDER — MORPHINE SULFATE 2 MG/ML IJ SOLN: 1.0000 mg | INTRAMUSCULAR | Status: DC | PRN

## 2011-12-26 MED ORDER — SODIUM CHLORIDE 0.9 % IV BOLUS (SEPSIS)
1000.0000 mL | Freq: Once | INTRAVENOUS | Status: AC
  Administered 2011-12-26: 1000 mL via INTRAVENOUS

## 2011-12-26 MED ORDER — SODIUM CHLORIDE 0.9 % IV SOLN
INTRAVENOUS | Status: DC
  Administered 2011-12-27 (×2): via INTRAVENOUS

## 2011-12-26 MED ORDER — ONDANSETRON HCL 4 MG PO TABS: 4.0000 mg | ORAL_TABLET | Freq: Four times a day (QID) | ORAL | Status: DC | PRN

## 2011-12-26 MED ORDER — ACETAMINOPHEN 650 MG RE SUPP: 650.0000 mg | Freq: Four times a day (QID) | RECTAL | Status: DC | PRN

## 2011-12-26 MED ORDER — VITAMIN B-12 1000 MCG PO TABS
1000.0000 ug | ORAL_TABLET | Freq: Every day | ORAL | Status: DC
  Administered 2011-12-27 – 2011-12-28 (×2): 1000 ug via ORAL
  Filled 2011-12-26 (×2): qty 1

## 2011-12-26 MED ORDER — LISINOPRIL 5 MG PO TABS
5.0000 mg | ORAL_TABLET | Freq: Every day | ORAL | Status: DC
  Administered 2011-12-27 – 2011-12-28 (×2): 5 mg via ORAL
  Filled 2011-12-26 (×2): qty 1

## 2011-12-26 MED ORDER — SIMVASTATIN 10 MG PO TABS
10.0000 mg | ORAL_TABLET | Freq: Every day | ORAL | Status: DC
  Administered 2011-12-27: 10 mg via ORAL
  Filled 2011-12-26 (×2): qty 1

## 2011-12-26 MED ORDER — ASPIRIN 81 MG PO CHEW
324.0000 mg | CHEWABLE_TABLET | Freq: Once | ORAL | Status: AC
  Administered 2011-12-26: 324 mg via ORAL
  Filled 2011-12-26: qty 4

## 2011-12-26 MED ORDER — ASPIRIN EC 81 MG PO TBEC
81.0000 mg | DELAYED_RELEASE_TABLET | Freq: Every day | ORAL | Status: DC
  Administered 2011-12-27 – 2011-12-28 (×2): 81 mg via ORAL
  Filled 2011-12-26 (×2): qty 1

## 2011-12-26 MED ORDER — ISOSORBIDE MONONITRATE ER 60 MG PO TB24
60.0000 mg | ORAL_TABLET | Freq: Every day | ORAL | Status: DC
  Administered 2011-12-27 (×2): 60 mg via ORAL
  Filled 2011-12-26 (×3): qty 1

## 2011-12-26 MED ORDER — ONDANSETRON HCL 4 MG/2ML IJ SOLN: 4.0000 mg | Freq: Four times a day (QID) | INTRAMUSCULAR | Status: DC | PRN

## 2011-12-26 MED ORDER — ACETAMINOPHEN 325 MG PO TABS: 650.0000 mg | ORAL_TABLET | Freq: Four times a day (QID) | ORAL | Status: DC | PRN

## 2011-12-26 MED ORDER — SODIUM CHLORIDE 0.9 % IV SOLN
INTRAVENOUS | Status: AC
  Administered 2011-12-26: 23:00:00 via INTRAVENOUS

## 2011-12-26 MED ORDER — DONEPEZIL HCL 5 MG PO TABS
5.0000 mg | ORAL_TABLET | Freq: Every day | ORAL | Status: DC
  Administered 2011-12-27 (×2): 5 mg via ORAL
  Filled 2011-12-26 (×3): qty 1

## 2011-12-26 MED ORDER — MIRTAZAPINE 15 MG PO TABS
15.0000 mg | ORAL_TABLET | Freq: Every day | ORAL | Status: DC
  Administered 2011-12-27 (×2): 15 mg via ORAL
  Filled 2011-12-26 (×3): qty 1

## 2011-12-26 NOTE — H&P (Signed)
[facility] Attending Admit Note  Patient seen and examined by me in the ED, case discussed with Dr Elwyn Reach and I agree with her assess/plan. At the time of my exam, [name] reported that she has no further "fullness", no pain and no dyspnea.  She seems most concerned about her inability to remember details of the events that brought her to the hospital.  She is oriented to "hospital in [place]", and she is able to give a fairly accurate description of how she is feeling at this time.   By my exam she is alert, no apparent distress. Neck supple. Moist mucus membranes.  COR Regular S1S2.  She has discomfort over mid-sternum with gentle palpation, she says this is how she felt earlier (she remembers this).  PULM clear bilaterally.  ABD soft, nontender.  LEs; without pitting.  DP Pulses present bilat.   Labs, CXR, ECG from 16:46pm reviewed by me, unremarkable.   Assess/Plan: Patient brought to ED for report of chest "fullness", thus far her workup (including CXR, ECG, enzymes) does not support ACS event.  She is pain-free at this time.  The reproducibility of the discomfort with gentle palpation of sternum further supports non-cardiac cause.  Would consider likely for discharge to the SNF where she lives tomorrow. Paula Compton, MD

## 2011-12-26 NOTE — ED Notes (Signed)
Pt's heart rate was 58 and I let the Rn Ed know. 7:16pm JG.

## 2011-12-26 NOTE — H&P (Signed)
Family Medicine Teaching [facility] Admission History and Physical  Patient name: Valerie Chen Medical record number: [id number] Date of birth: 12/08/1928 Age: 78 y.o. Gender: female  Primary Care Provider: Kirstie Peri, MD  Chief Complaint: chest pain History of Present Illness: Valerie Chen is a 77 y.o. year old female with dementia, HTN, HLD, presumed CAD presenting with chest pain x1 day.  Patient reports that she moved into assisted living about 6 months ago and has been doing really well until the day of admission. She states she was "feeling bad" with decreased appetite and some worse confusion this morning.  Also describes some substernal to left sided chest pain that is worse with deep inspiration sometimes.  No radiation.  Associated with some shortness of breath but no dizziness or diaphoresis.  Described as intermittently burning and also a fullness like her chest is "about to pop open."  Has not had this before. Currently chest pain is gone but still complaining of a fullness sensation.  In addition did have some nausea and vomiting earlier today.  Also has frequent headaches.  No abdominal pain, diarrhea, constipation, fever/chills, difficulty urinating.  She does have presumed CAD based on a difficult to perform nuclear study in 2012.  History is somewhat limited by patient being a poor history.   ED Course: s/p SL nitro x4 at assisted living; EKG and POC troponin negative.  Patient Active Problem List  Diagnosis  . Chest pain  . Orthostatic hypotension  . Migraines  . Dementia  . Ejection fraction  . Diastolic dysfunction  . TIA (transient ischemic attack)  . GI bleed  . Dyslipidemia  . CAD (coronary artery disease)   Past Medical History: Past Medical History  Diagnosis Date  . Chest pain     Hospital February, 2012, nuclear, possible anteroseptal and inferoseptal ischemia, technically difficult, medical therapy  . Dyslipidemia   . Orthostatic hypotension   .  Migraines     25 year  . Dementia     Significant  . Ejection fraction     EF 55%, echo, February, 2012  . Diastolic dysfunction     Echo, February, 2012  . TIA (transient ischemic attack)        ???question of a TIA in February, 2012, carotid Doppler, August 11, 2010 no significant abnormalities  . GI bleed     February, 2012 incomplete colonoscopy, hemorrhoids, needs complete colonoscopy  . CAD (coronary artery disease)     Probable CAD, nuclear February, 2012, possible anteroseptal and inferoseptal ischemia but the study was technically difficult  . Hyperlipidemia     Past Surgical History: Past Surgical History  Procedure Date  . Abdominal hysterectomy   . Diagnostic mammogram 12/29/2000    Mammogram, suspicious lesions -BX pending    Social History: History   Social History  . Marital Status: Widowed    Spouse Name: N/A    Number of Children: 3  . Years of Education: N/A   Occupational History  . RETIRED    Social History Main Topics  . Smoking status: Never Smoker   . Smokeless tobacco: Not on file  . Alcohol Use: No  . Drug Use: No  . Sexually Active: Not on file   Other Topics Concern  . Not on file   Social History Narrative   2 husbands3 children    Family History: Family History  Problem Relation Age of Onset  . Heart attack Father     several heart attacks  . Hypertension Daughter  Allergies: No Known Allergies  Current Facility-Administered Medications  Medication Dose Route Frequency Provider Last Rate Last Dose  . aspirin chewable tablet 324 mg  324 mg Oral Once [employer], MD      . sodium chloride 0.9 % bolus 1,000 mL  1,000 mL Intravenous Once Gwyneth Sprout, MD       Current Outpatient Prescriptions  Medication Sig Dispense Refill  . aspirin (ASPIR-LOW) 81 MG EC tablet Take 81 mg by mouth daily.        . cyanocobalamin ([employer] VITAMIN B12) 1000 MCG tablet Take 1,000 mcg by mouth daily.       Marland Kitchen donepezil (ARICEPT) 5 MG  tablet Take 5 mg by mouth at bedtime.        Marland Kitchen HYDROcodone-acetaminophen (NORCO) 5-325 MG per tablet Take 1 tablet by mouth every 6 (six) hours as needed. For pain      . hyoscyamine (LEVSIN SL) 0.125 MG SL tablet Place 1 tablet under the tongue Every 6 hours as needed. Pain, spasm, abdominal pain, headaches      . isosorbide mononitrate (IMDUR) 60 MG 24 hr tablet Take 60 mg by mouth at bedtime.       Marland Kitchen lisinopril (PRINIVIL,ZESTRIL) 5 MG tablet Take 5 mg by mouth daily.      . mirtazapine (REMERON) 15 MG tablet Take 1 tablet by mouth at bedtime.      . nitroGLYCERIN (NITROSTAT) 0.4 MG SL tablet Place 0.4 mg under the tongue every 5 (five) minutes as needed. May repeat up to 3 times.  If after 3rd dose chest pain persists, call 911 and proceed to ER.       . pravastatin (PRAVACHOL) 20 MG tablet Take 20 mg by mouth daily.        . promethazine (PHENERGAN) 12.5 MG tablet Take 12.5 mg by mouth every 6 (six) hours as needed. For nausea      . QUEtiapine (SEROQUEL) 25 MG tablet Take 12.5 mg by mouth at bedtime.        . traMADol (ULTRAM) 50 MG tablet Take 50 mg by mouth 3 (three) times daily as needed. pain       Review Of Systems: Per HPI with the following additions: none Otherwise 12 point review of systems was performed and was unremarkable.  Physical Exam: Pulse: 58  Blood Pressure: 160/69 RR: 18   O2: 100 on RA Temp: 98.6  General: alert, cooperative, appears stated age, no distress and pleasant HEENT: PERRLA, extra ocular movement intact, sclera clear, anicteric, oropharynx clear, no lesions, neck supple with midline trachea and MMM Heart: S1, S2 normal, no murmur, rub or gallop, regular rate and rhythm Chest: tender to palpation along left sternal border Lungs: clear to auscultation, no wheezes or rales and unlabored breathing Abdomen: +BS, abdomen is soft without masses, organomegaly or guarding; mild TTP in lower quadrants (L>R) Extremities: 1+ edema bilaterally to mid-shin, palpable DP  pulses Skin:no rashes, no ecchymoses Neurology: normal without focal findings, speech normal, alert and oriented x1.5 (did not know date, knew city but not hospital), PERLA and cranial nerves 2-12 intact, 1/3 on recall  Labs and Imaging:  Lab 12/26/11 1709  WBC 4.8  HGB 15.2*  HCT 44.3  PLT 164    Lab 12/26/11 1709  NA 140  K 4.3  CL 104  CO2 28  BUN 17  CREATININE 0.80  CALCIUM 9.6  PROT 6.9  BILITOT 0.3  ALKPHOS 69  ALT 17  AST 20  GLUCOSE 95  POC troponin: negative  UA: trace LE only  EKG: nsr, no ST changes  CXR: 1. No acute cardiopulmonary disease. Slightly low lung volumes  compared to prior.  2. Anterior mediastinal contour abnormality/density is favored to  be artifactual. Repeat PA and lateral with full inspiration  recommended when patient condition permits.  Assessment and Plan: Makaela Cando is a 78 y.o. year old female with dementia and presumed CAD presenting with chest pain.  # Chest pain: Unclear etiology.  Given history of CAD, there is concern for cardiac cause; however initial EKG and POC troponin negative.  May also have GERD component given description of intermittent burning sensation.  Unlikely pulmonary in origin given normal CXR and normal exam.  Pain currently resolved, although she does continue to complain of feeling "fullness" in her chest. - admit to telemetry - will try a GI cocktail - cycle cardiac enzymes - will check risk stratification labs - TSH, A1c, lipid panel - repeat EKG in the morning - continue aspirin 81mg  daily - morphine 1mg  q3hr prn available for pain - consider cardiology consult in am - seen by [facility]ology  # Nausea and vomiting: May be related to chest pain.  Does have some lower quadrant abdominal tenderness and LE on UA so could be early UTI.  No fevers or white count to suggest serious infection. - will add on urine culture - zofran prn for nausea - IVFs for hydration - will start a regular diet -  will have PT evaluate as well  # CAD: Presumed based on nuclear study last year.  At that time was medical management only. - continue aspirin 81mg  daily - consider cardiology consult in am  # Dementia: Currently living in ALF.  No family present at bedside so difficult to determine if she is at her baseline or not. - will continue home medications - social work consult  # HLD: Stable. Continue home medications. - will check lipid panel as above  # HTN: Elevated in the ED.  Continue home medications. - will continue to monitor  FEN/GI: cardiac diet; NS at 125cc/hr Prophylaxis: SQ lovenox Disposition: admit to telemetry  BOOTH, Rabia Argote 12/26/2011, 8:23 PM

## 2011-12-26 NOTE — ED Notes (Signed)
Patient brought in by [facility].  They advised patient is coming from an nursing home. Patient's family and staff said she has been having chest discomfort rating at a 7.  Upon arrival patient said she is no longer feeling discomfort.  Staff gave her .4 SL Nitro.  Called EMS, vitals were 190/90, 70, BS=96, no pain.   Unremarkable 12 leads x2.  BP 176/88, has 20 in left FA.

## 2011-12-26 NOTE — ED Provider Notes (Addendum)
History     CSN: [id number]  Arrival date & time 12/26/11  1640   First MD Initiated Contact with Patient 12/26/11 1645      Chief Complaint  Patient presents with  . Chest Pain    (Consider location/radiation/quality/duration/timing/severity/associated sxs/prior treatment) Patient is a 78 y.o. female presenting with chest pain. The history is provided by the patient.  Chest Pain The chest pain began 6 - 12 hours ago. Chest pain occurs constantly. The chest pain is improving. The pain is associated with breathing. At its most intense, the pain is at 7/10. The pain is currently at 3/10. The severity of the pain is moderate. The quality of the pain is described as aching. The pain does not radiate. Chest pain is worsened by deep breathing and certain positions. Primary symptoms include fatigue, shortness of breath, abdominal pain, nausea and vomiting. Pertinent negatives for primary symptoms include no fever, no cough and no palpitations.  The vomiting began today. Vomiting occurred once. The emesis contains stomach contents.  Associated symptoms include weakness.  Pertinent negatives for associated symptoms include no diaphoresis. She tried nitroglycerin for the symptoms. Risk factors include being elderly.  Her past medical history is significant for CAD, hyperlipidemia and hypertension.  Pertinent negatives for past medical history include no diabetes.  Procedure history is positive for stress thallium.     Past Medical History  Diagnosis Date  . Chest pain     Hospital February, 2012, nuclear, possible anteroseptal and inferoseptal ischemia, technically difficult, medical therapy  . Dyslipidemia   . Orthostatic hypotension   . Migraines     25 year  . Dementia     Significant  . Ejection fraction     EF 55%, echo, February, 2012  . Diastolic dysfunction     Echo, February, 2012  . TIA (transient ischemic attack)        ???question of a TIA in February, 2012, carotid  Doppler, August 11, 2010 no significant abnormalities  . GI bleed     February, 2012 incomplete colonoscopy, hemorrhoids, needs complete colonoscopy  . CAD (coronary artery disease)     Probable CAD, nuclear February, 2012, possible anteroseptal and inferoseptal ischemia but the study was technically difficult  . Hyperlipidemia     Past Surgical History  Procedure Date  . Abdominal hysterectomy   . Diagnostic mammogram 12/29/2000    Mammogram, suspicious lesions -BX pending    Family History  Problem Relation Age of Onset  . Heart attack Father     several heart attacks  . Hypertension Daughter     History  Substance Use Topics  . Smoking status: Never Smoker   . Smokeless tobacco: Not on file  . Alcohol Use: No    OB History    Grav Para Term Preterm Abortions TAB SAB Ect Mult Living                  Review of Systems  Constitutional: Positive for fatigue. Negative for fever and diaphoresis.  Respiratory: Positive for shortness of breath. Negative for cough.   Cardiovascular: Positive for chest pain. Negative for palpitations.  Gastrointestinal: Positive for nausea, vomiting and abdominal pain.  Genitourinary: Positive for frequency.  Neurological: Positive for weakness.  Psychiatric/Behavioral: Positive for confusion.  All other systems reviewed and are negative.    Allergies  Review of patient's allergies indicates no known allergies.  Home Medications   Current Outpatient Rx  Name Route Sig Dispense Refill  . ASPIRIN 81 MG  PO TBEC Oral Take 81 mg by mouth daily.      . CYANOCOBALAMIN 1000 MCG PO TABS Oral Take 1,000 mcg by mouth daily.     . DONEPEZIL HCL 5 MG PO TABS Oral Take 5 mg by mouth at bedtime.      Marland Kitchen HYOSCYAMINE SULFATE 0.125 MG SL SUBL Sublingual Place 1 tablet under the tongue Every 6 hours as needed. Pain, spasm, abdominal pain, headaches    . ISOSORBIDE MONONITRATE ER 60 MG PO TB24 Oral Take 60 mg by mouth at bedtime.     Marland Kitchen MIRTAZAPINE 15  MG PO TABS Oral Take 1 tablet by mouth at bedtime.    Marland Kitchen NITROGLYCERIN 0.4 MG SL SUBL Sublingual Place 0.4 mg under the tongue every 5 (five) minutes as needed. May repeat up to 3 times.  If after 3rd dose chest pain persists, call 911 and proceed to ER.     Marland Kitchen PRAVASTATIN SODIUM 20 MG PO TABS Oral Take 20 mg by mouth daily.      . QUETIAPINE FUMARATE 25 MG PO TABS Oral Take 12.5 mg by mouth at bedtime.      . TRAMADOL HCL 50 MG PO TABS Oral Take 50 mg by mouth 3 (three) times daily as needed. pain      BP 170/68  Pulse 68  Temp 98.3 F (36.8 C) (Oral)  Resp 14  Physical Exam  Nursing note and vitals reviewed. Constitutional: She is oriented to person, place, and time. She appears well-developed and well-nourished. No distress.  HENT:  Head: Normocephalic and atraumatic.  Mouth/Throat: Oropharynx is clear and moist.  Eyes: Conjunctivae and EOM are normal. Pupils are equal, round, and reactive to light.  Neck: Normal range of motion. Neck supple.  Cardiovascular: Normal rate, regular rhythm and intact distal pulses.   No murmur heard. Pulmonary/Chest: Effort normal and breath sounds normal. No respiratory distress. She has no wheezes. She has no rales. She exhibits tenderness.    Abdominal: Soft. Normal appearance. She exhibits no distension. There is tenderness in the suprapubic area. There is no rebound, no guarding and no CVA tenderness.  Musculoskeletal: Normal range of motion. She exhibits no edema and no tenderness.  Neurological: She is alert and oriented to person, place, and time.  Skin: Skin is warm and dry. No rash noted. No erythema.  Psychiatric: She has a normal mood and affect. Her behavior is normal.    ED Course  Procedures (including critical care time)  Labs Reviewed  CBC WITH DIFFERENTIAL - Abnormal; Notable for the following:    Hemoglobin 15.2 (*)     All other components within normal limits  COMPREHENSIVE METABOLIC PANEL - Abnormal; Notable for the  following:    GFR calc non Af Amer 67 (*)     GFR calc Af Amer 78 (*)     All other components within normal limits  URINALYSIS, ROUTINE W REFLEX MICROSCOPIC - Abnormal; Notable for the following:    Leukocytes, UA TRACE (*)     All other components within normal limits  POCT I-STAT TROPONIN I  URINE MICROSCOPIC-ADD ON   Dg Chest 2 View  12/26/2011  *RADIOLOGY REPORT*  Clinical Data: Chest pain.  Short of breath.  Dementia.  CHEST - 2 VIEW  Comparison: 03/03/2011.  Findings:  Cardiopericardial silhouette within normal limits. Mediastinal contours normal. Trachea midline.  No airspace disease or effusion. Lung volumes are lower than prior with mild basilar atelectasis.  Contour abnormality on the lateral view over  the ascending aorta is favored to be artifactual.  IMPRESSION:  1.  No acute cardiopulmonary disease.  Slightly low lung volumes compared to prior. 2.  Anterior mediastinal contour abnormality/density is favored to be artifactual.  Repeat PA and lateral with full inspiration recommended when patient condition permits.  Original Report Authenticated By: Andreas Newport, M.D.     Date: 12/26/2011  Rate: 63  Rhythm: normal sinus rhythm  QRS Axis: normal  Intervals: normal  ST/T Wave abnormalities: normal  Conduction Disutrbances: none  Narrative Interpretation: unremarkable      1. Chest pain       MDM   Patient with diffuse vague complaints that started with not feeling well over the last few days and then developing chest pain when she woke up this morning. She denies having pain right now except when she takes deep breaths. She does have multiple cardiac risk factors and in 2012 had a nuclear medicine test that showed probable coronary artery disease and ischemia. At that time patient was decided to have medical management. Concern for possible cardiac related cause for patient's symptoms versus infectious such as UTI. Patient does have some mild suprapubic pain on exam  states she's been nauseated for the last few days. She has a normal EKG. CBC, CMP, UA, chest x-ray pending.  6:35 PM All initial labs are wnl.  Pt will be admitted for CP r/o.     Gwyneth Sprout, MD 12/26/11 1836  Gwyneth Sprout, MD 12/26/11 9147

## 2011-12-27 ENCOUNTER — Encounter (HOSPITAL_COMMUNITY): Payer: Self-pay | Admitting: *Deleted

## 2011-12-27 LAB — CBC
HCT: 42.6 % (ref 36.0–46.0)
Hemoglobin: 13.8 g/dL (ref 12.0–15.0)
MCH: 30.3 pg (ref 26.0–34.0)
MCHC: 34 g/dL (ref 30.0–36.0)
MCV: 88.2 fL (ref 78.0–100.0)
MCV: 88.6 fL (ref 78.0–100.0)
Platelets: 149 10*3/uL — ABNORMAL LOW (ref 150–400)
RBC: 4.56 MIL/uL (ref 3.87–5.11)
RDW: 12.7 % (ref 11.5–15.5)

## 2011-12-27 LAB — TSH: TSH: 3.116 u[IU]/mL (ref 0.350–4.500)

## 2011-12-27 LAB — LIPID PANEL
Cholesterol: 151 mg/dL (ref 0–200)
HDL: 78 mg/dL (ref >39)
Total CHOL/HDL Ratio: 1.9 RATIO

## 2011-12-27 LAB — BASIC METABOLIC PANEL
CO2: 26 mEq/L (ref 19–32)
Calcium: 8.5 mg/dL (ref 8.4–10.5)
Glucose, Bld: 91 mg/dL (ref 70–99)
Sodium: 142 mEq/L (ref 135–145)

## 2011-12-27 LAB — CARDIAC PANEL(CRET KIN+CKTOT+MB+TROPI)
CK, MB: 2.7 ng/mL (ref 0.3–4.0)
CK, MB: 3.6 ng/mL (ref 0.3–4.0)
Relative Index: 2.7 — ABNORMAL HIGH (ref 0.0–2.5)
Relative Index: 2.4 (ref 0.0–2.5)
Total CK: 101 U/L (ref 7–177)
Troponin I: <0.3 ng/mL (ref <0.30)

## 2011-12-27 MED ORDER — AMLODIPINE BESYLATE 5 MG PO TABS: 5.0000 mg | ORAL_TABLET | Freq: Every day | ORAL | Status: DC

## 2011-12-27 MED ORDER — AMLODIPINE BESYLATE 5 MG PO TABS
5.0000 mg | ORAL_TABLET | Freq: Every day | ORAL | Status: DC
  Administered 2011-12-27 – 2011-12-28 (×2): 5 mg via ORAL
  Filled 2011-12-27 (×2): qty 1

## 2011-12-27 NOTE — Progress Notes (Signed)
PGY-1 Daily Progress Note Family Medicine Teaching Service Simone Curia, MD Service Pager: [phone]   Subjective: No acute events overnight, feels "overall better" with some complaints of continued SOB, chest discomfort, nausea.  Objective:  VITALS Temp:  [97.5 F (36.4 C)-98.6 F (37 C)] 97.5 F (36.4 C) (07/08 1300) Pulse Rate:  [58-68] 62  (07/08 1300) Resp:  [14-18] 18  (07/08 1300) BP: (128-170)/(49-106) 147/78 mmHg (07/08 1300) SpO2:  [96 %-100 %] 99 % (07/08 1300) Weight:  [179 lb (81.194 kg)] 179 lb (81.194 kg) (07/07 2329)  In/Out  Intake/Output Summary (Last 24 hours) at 12/27/11 1448 Last data filed at 12/27/11 1230  Gross per 24 hour  Intake   1480 ml  Output    650 ml  Net    830 ml    Physical Exam: Gen:  NAD, pleasant, lying in bed in gown HEENT: moist mucous membranes, EOMI CV: Regular rate and rhythm, no murmurs rubs or gallops; 2+ dorsalis pedis pulses PULM: clear to auscultation bilaterally. No wheezes/rales/rhonchi, no increased WOB ABD: soft/nontender/nondistended/normal bowel sounds EXT: No edema B LE Neuro: Alert  MEDS - reviewed  Labs and imaging:   CBC  Lab 12/27/11 0647 12/26/11 2344 12/26/11 1709  WBC 4.1 4.5 4.8  HGB 13.8 14.5 15.2*  HCT 40.2 42.6 44.3  PLT 149* 154 164   BMET/CMET  Lab 12/27/11 0647 12/26/11 2344 12/26/11 1709  NA 142 -- 140  K 3.7 -- 4.3  CL 108 -- 104  CO2 26 -- 28  BUN 12 -- 17  CREATININE 0.66 0.66 0.80  CALCIUM 8.5 -- 9.6  PROT -- -- 6.9  BILITOT -- -- 0.3  ALKPHOS -- -- 69  ALT -- -- 17  AST -- -- 20  GLUCOSE 91 -- 95   HgbA1c - 5.3 Lipid Panel     Component Value Date/Time   CHOL 151 12/27/2011 0647   TRIG 66 12/27/2011 0647   HDL 78 12/27/2011 0647   CHOLHDL 1.9 12/27/2011 0647   VLDL 13 12/27/2011 0647   LDLCALC 60 12/27/2011 0647   proBNP 64.9  Dg Chest 2 View  12/26/2011 CXR - IMPRESSION:  1.  No acute cardiopulmonary disease.     Assessment  Pt is a 78 y.o. patient with significant  PMHx for dementia, HTN, HLD, and presumed CAD admitted with chest pain x 1 day.   Plan:  1.Chest pain: unclear etiology.  Given h/o CAD, concern for cardiac disease; however, initial EKG and troponins x 2 negative. May also have GERD component given description of intermittent burning sensation.  Unlikely pulmonary given normal CXR and normal exam. Pain resolved but "heaviness" still present though she states it is getting better. A1c, lipid panel, and proBNP wnl. - f/u TSH - continue aspirin 81 mg daily - continue morphine 1mg  IV q3hours prn severe pain - medical management with imdur; consider adding norvasc 5 mg daily after discuss goals of care with family.  We do not feel that cardiac catheterization would be appropriate in this patient but will continue to discuss with family.   2. Nausea and vomiting - Resolved.  UA showed trace LE but otherwise was unremarkable, in setting of no fevers or white count.  Currently on heart healthy diet. - f/u UCx - zofran prn nausea - IVFs for hydration  3. CAD: Presumed bsed on nuclear study last year which was medical management only. - Continue aspirin 81 mg daily - f/u as outpatient with [facility] cardiology  4. Dementia: Currently  living in ALF. No family present today, hard to determine if pt at baseline.  - Continue home medications - SW consult - awaiting recs. Likely d/c back to ALF  5. HLD - Stable. Continue home medications.  6. HTN: Elevated in ED. Continue home medications. - Continue to monitor  FEN/GI: Cardiac diet and NS at 125cc/hr Prophylaxis:  SQ lovenox Disposition: Discharge back to ALF with close f/u with [facility].  Simone Curia, MD FMTS PGY-1

## 2011-12-27 NOTE — Evaluation (Signed)
Physical Therapy Evaluation Patient Details Name: Valerie Chen MRN: [id number] DOB: Aug 10, 1928 Today's Date: 12/27/2011 Time: 1410-1436 PT Time Calculation (min): 26 min  PT Assessment / Plan / Recommendation Clinical Impression  PAtient s/p chest pain with decr mobillity secondary to poor balance.  Will benefit from PT to address balance     PT Assessment  Patient needs continued PT services    Follow Up Recommendations  Skilled nursing facility;Supervision/Assistance - 24 hour (Unsure if pt at A living or NH- needs therapy where she goes)    Barriers to Discharge Decreased caregiver support      Equipment Recommendations  Defer to next venue    Recommendations for Other Services     Frequency Min 2X/week    Precautions / Restrictions Precautions Precautions: Fall Restrictions Weight Bearing Restrictions: No   Pertinent Vitals/Pain VSS, No pain      Mobility  Bed Mobility Bed Mobility: Rolling Left;Left Sidelying to Sit;Sitting - Scoot to Edge of Bed Rolling Left: 7: Independent Left Sidelying to Sit: 7: Independent Sitting - Scoot to Edge of Bed: 7: Independent Transfers Transfers: Sit to Stand;Stand to Sit Sit to Stand: 4: Min guard Stand to Sit: 4: Min guard Ambulation/Gait Ambulation/Gait Assistance: 4: Min guard Ambulation Distance (Feet): 350 Feet Assistive device: None Ambulation/Gait Assistance Details: Patient does well overall.  Slight LOB with challenges.  See Sharlene Motts Gait Pattern: Within Functional Limits Stairs: No Wheelchair Mobility Wheelchair Mobility: No         PT Diagnosis: Difficulty walking  PT Problem List: Decreased activity tolerance;Decreased balance;Decreased mobility;Decreased safety awareness;Decreased knowledge of use of DME;Decreased knowledge of precautions PT Treatment Interventions: Gait training;Functional mobility training;Therapeutic activities;Therapeutic exercise;Balance training;Patient/family education;DME instruction    PT Goals Acute Rehab PT Goals PT Goal Formulation: With patient Time For Goal Achievement: 12/27/11 Potential to Achieve Goals: Good Pt will go Supine/Side to Sit: Independently PT Goal: Supine/Side to Sit - Progress: Goal set today Pt will go Sit to Stand: Independently PT Goal: Sit to Stand - Progress: Goal set today Pt will Ambulate: 1 - 15 feet;with supervision;with least restrictive assistive device PT Goal: Ambulate - Progress: Goal set today  Visit Information  Last PT Received On: 12/27/11 Assistance Needed: +1    Subjective Data  Subjective: "I will do good.  I live in a place with a bunch of people." Patient Stated Goal: To go back to facility she was from   Prior Functioning  Home Living Lives With:  (? memory unit at facility per pt) Available Help at Discharge: Skilled Nursing Facility;Available 24 hours/day (pt reports she can have someone with her at all times) Type of Home: Skilled Nursing Facility Home Access: Level entry    Cognition  Overall Cognitive Status: History of cognitive impairments - at baseline Area of Impairment: Memory;Following commands;Safety/judgement;Awareness of errors;Awareness of deficits;Problem solving Arousal/Alertness: Awake/alert Orientation Level: Appears intact for tasks assessed Behavior During Session: [facility] for tasks performed Memory: Decreased recall of precautions Memory Deficits: Patient with short and long term memory problems per questioning.   Following Commands: Follows one step commands inconsistently;Follows one step commands with increased time Safety/Judgement: Decreased awareness of safety precautions;Decreased safety judgement for tasks assessed;Decreased awareness of need for assistance Awareness of Errors: Assistance required to identify errors made;Assistance required to correct errors made    Extremity/Trunk Assessment Right Upper Extremity Assessment RUE ROM/Strength/Tone: [facility] for tasks assessed RUE  Sensation: WFL - Light Touch RUE Coordination: WFL - gross/fine motor Left Upper Extremity Assessment LUE ROM/Strength/Tone: WFL for tasks  assessed LUE Sensation: WFL - Light Touch LUE Coordination: WFL - gross/fine motor Right Lower Extremity Assessment RLE ROM/Strength/Tone: WFL for tasks assessed RLE Sensation: WFL - Light Touch RLE Coordination: WFL - gross/fine motor Left Lower Extremity Assessment LLE ROM/Strength/Tone: WFL for tasks assessed LLE Sensation: WFL - Light Touch LLE Coordination: WFL - gross/fine motor Trunk Assessment Trunk Assessment: Normal   Balance Balance Balance Assessed: Yes Static Sitting Balance Static Sitting - Balance Support: Feet supported;No upper extremity supported Static Sitting - Level of Assistance: 7: Independent Dynamic Sitting Balance Dynamic Sitting - Balance Support: No upper extremity supported;Feet supported;During functional activity Dynamic Sitting - Level of Assistance: 7: Independent Reach (Patient is able to reach ___ inches to right, left, forward, back): 10 Dynamic Sitting - Balance Activities: Forward lean/weight shifting;Lateral lean/weight shifting;Reaching across midline Static Standing Balance Static Standing - Balance Support: During functional activity;No upper extremity supported Static Standing - Level of Assistance: 4: Min assist Single Leg Stance - Right Leg: 7  Standardized Balance Assessment Standardized Balance Assessment: Berg Balance Test Berg Balance Test Sit to Stand: Able to stand  independently using hands Standing Unsupported: Able to stand safely 2 minutes Sitting with Back Unsupported but Feet Supported on Floor or Stool: Able to sit safely and securely 2 minutes Stand to Sit: Controls descent by using hands Transfers: Able to transfer safely, minor use of hands Standing Unsupported with Eyes Closed: Able to stand 10 seconds safely Standing Ubsupported with Feet Together: Able to place feet together  independently and stand 1 minute safely From Standing, Reach Forward with Outstretched Arm: Can reach confidently >25 cm (10") From Standing Position, Pick up Object from Floor: Able to pick up shoe, needs supervision From Standing Position, Turn to Look Behind Over each Shoulder: Looks behind from both sides and weight shifts well Turn 360 Degrees: Able to turn 360 degrees safely in 4 seconds or less Standing Unsupported, Alternately Place Feet on Step/Stool: Able to stand independently and complete 8 steps >20 seconds Standing Unsupported, One Foot in Front: Needs help to step but can hold 15 seconds Standing on One Leg: Able to lift leg independently and hold equal to or more than 3 seconds Total Score: 47  High Level Balance High Level Balance Comments: 47/56 on Berg suggesting at moderate risk of falls if not using RW.   End of Session PT - End of Session Equipment Utilized During Treatment: Gait belt Activity Tolerance: Patient tolerated treatment well Patient left: in chair;with call bell/phone within reach Nurse Communication: Mobility status      INGOLD,Tyann Niehaus 12/27/2011, 3:14 PM  [facility] [phone] (pager)

## 2011-12-27 NOTE — Discharge Summary (Signed)
I have reviewed this discharge summary and agree.    

## 2011-12-27 NOTE — Progress Notes (Signed)
Contacted ALF just now to see if patient could return today- they are not able to take her today due unable to get new meds until tomorrow- patient and daughter aware- will plan transfer via car tomorrow - Reece Levy, MSW, Theresia Majors [phone]

## 2011-12-27 NOTE — Progress Notes (Signed)
Family Medicine Teaching Service Attending Note  I interviewed and examined patient Valerie Chen and reviewed their tests and x-rays.  I discussed with Dr. Benjamin Chen  and reviewed their note for today.  I agree with their assessment and plan.     Additionally  Feels well this AM did not have chest pain when I interviewed her Chest pain could be angina but more likely GI.  Could add low dose CCB to help if chronic angina Proceed as per her family wishes

## 2011-12-27 NOTE — Discharge Summary (Addendum)
Discharge Summary 12/28/2011 12:20 PM  Ursala Cressy DOB: July 19, 1928 MRN: [id number]  Date of Admission: 12/26/2011 Date of Discharge: 12/27/11  PCP: Kirstie Peri, MD Consultants: Social Work  Reason for Admission: Chest pain rule-out  Discharge Diagnosis Primary 1. Angina, unclear etiology Secondary 1. Nausea/vomiting 2. CAD 3. Dementia 4. HLD 5. HTN  Hospital Course: Pt is a 78 y.o. patient with significant PMHx for dementia, HTN, HLD, and presumed CAD admitted with chest pain x 1 day.  1. Angina, unclear etiology - Pt with h/o CAD, initial EKG and troponins neg x 2.  May also have GERD component given description of intermittent burning sensation, unlikely pulmonary given normal CXR and normal exam.  Pain resolved but "heaviness" remained, though pt states she is getting better.  A1c, lipid panel, and proBNP wnl.  TSH unresulted on discharge.  Aspirin 81 mg continued through hospitalization.  Morphine given prn severe pain.  Medical management with imdur and lisinopril during hospitalization.  Spoke with daughter Ardeen Fillers who desires medical management of angina and agrees that cardiac catheterization was not appropriate treatment for pt with dementia.  After this discussion, adding norvasc 5 mg for further med management of angina.  Discharge pt to ALF with close f/u with PCP and [facility] cardiology.  2. Nausea/vomiting - Resolved day 1 of hospitalization.  UA with trace LE so ordered UCx pending on d/c.  Pt received zofran prn nausea and IVFs for hydration and was stable throughout hospitalization.  3. CAD - Presumed based on nuclear study last year and plan at that time of medical management only.  Continued asa 81 mg daily.  Discharged on norvasc 5 mg daily and home medications and pt is to f/u as outpatient with [facility] cardiology.  4. Dementia - Pt lived in ALF prior to hospitalization.  Per daughter Dennie Bible on phone, pt wishes to return there.  Continued home dementia medications and  per SW pt to return to ALF today.  5. HLD - Stable, continued home medications through hospitalization with no issues.   6. HTN - Elevated in ED, continued home medications and continued to monitor with no issues.  Procedures:  CXR - no acute process  Discharge Medications Medication List  As of 12/28/2011 12:20 PM   START taking these medications         amLODipine 5 MG tablet   Commonly known as: NORVASC   Take 1 tablet (5 mg total) by mouth daily.         CHANGE how you take these medications         HYDROcodone-acetaminophen 5-325 MG per tablet   Commonly known as: NORCO   Take 1 tablet by mouth every 6 (six) hours as needed for pain.   What changed: - reasons to take the med - doctor's instructions         CONTINUE taking these medications         ASPIR-LOW 81 MG EC tablet   Generic drug: aspirin      [employer] VITAMIN B12 1000 MCG tablet   Generic drug: cyanocobalamin      donepezil 5 MG tablet   Commonly known as: ARICEPT      hyoscyamine 0.125 MG SL tablet   Commonly known as: LEVSIN SL      isosorbide mononitrate 60 MG 24 hr tablet   Commonly known as: IMDUR      lisinopril 5 MG tablet   Commonly known as: PRINIVIL,ZESTRIL      mirtazapine 15 MG tablet  Commonly known as: REMERON      nitroGLYCERIN 0.4 MG SL tablet   Commonly known as: NITROSTAT      pravastatin 20 MG tablet   Commonly known as: PRAVACHOL      promethazine 12.5 MG tablet   Commonly known as: PHENERGAN      QUEtiapine 25 MG tablet   Commonly known as: SEROQUEL      traMADol 50 MG tablet   Commonly known as: ULTRAM          Where to get your medications    These are the prescriptions that you need to pick up.   You may get these medications from any pharmacy.         amLODipine 5 MG tablet   HYDROcodone-acetaminophen 5-325 MG per tablet            Pertinent Hospital Labs Troponins negx2 UA trace LE  Discharge instructions: see AVS  Condition at discharge:  stable  Disposition: To ALF with Home Health PT  Pending Tests:  - UCx - TSH  Follow up: Follow-up Information    Follow up with [facility], [facility] in 1 week.   Contact information:   [address]  [place] [address] [phone]       Follow up with [facility] - call your cardiologist in 1 week.         Follow up Issues:  - Patient obtained all recommended medications - Pt being discharged to ALF with Home Health PT    Addendum 12/28/2011:  Subj: Patient's ALF unable to get medications ready for her to be discharged there yesterday.  She will be going today by car.  Obj: PE:  BP 144/75  Pulse 69  Temp 97.6 F (36.4 C) (Oral)  Resp 18  Ht 5\' 3"  (1.6 m)  Wt 179 lb (81.194 kg)  BMI 31.71 kg/m2  SpO2 95% CV: RRR, no reproducible chest pain Pulm: CTAB, no incr WOB, no w/r/r Abd: s/nt/nd, nabs Extr: no bilateral lower lobe edema  A/P: 78 y.o. patient with significant PMHx for dementia, HTN, HLD, and presumed CAD admitted with chest pain x 1 day. - Discharge today to ALF with home health PT; see above hospital course.

## 2011-12-28 LAB — URINE CULTURE

## 2011-12-28 MED ORDER — HYDROCODONE-ACETAMINOPHEN 5-325 MG PO TABS: 1.0000 | ORAL_TABLET | Freq: Four times a day (QID) | ORAL | Status: AC | PRN

## 2011-12-28 NOTE — Clinical Social Work Psychosocial (Signed)
     Clinical Social Work Department BRIEF PSYCHOSOCIAL ASSESSMENT 12/28/2011  Patient:  Valerie Chen, Valerie Chen     Account Number:  [id number]     Admit date:  12/26/2011  Clinical Social Worker:  Lourdes Sledge  Date/Time:  12/28/2011 11:09 AM  Referred by:  Physician  Date Referred:  12/27/2011 Referred for  ALF Placement   Other Referral:   Interview type:  Family Other interview type:   CSW spoke with pt daughter Ardeen Fillers [phone]    PSYCHOSOCIAL DATA Living Status:  FACILITY Admitted from facility:   Level of care:  Assisted Living Primary support name:  Ardeen Fillers [phone] Primary support relationship to patient:  CHILD, ADULT Degree of support available:   Pt daughter involved in pt care    CURRENT CONCERNS Current Concerns  Post-Acute Placement   Other Concerns:    SOCIAL WORK ASSESSMENT / PLAN CSW informed pt ready for discharge back to ALF. CSW spoke with pt daughter as pt presents with dementia. Daughter Dennie Bible states that pt is from [facility], has been there for 1 year and the plan is for pt to return today. CSW contacted ALF, and spoke to [facility] who confirmed pt okay to return when stable. CSW to fax dc summary and FL2 to facility. Daughter states she will provide transportation for pt today.   Assessment/plan status:  Psychosocial Support/Ongoing Assessment of Needs Other assessment/ plan:   Information/referral to community resources:   CSW informed daughter that CSW to facilitate with discharge paperwork. Daughter has no concerns or needs for resources for pt as pt LT resident at ALF.    PATIENTS/FAMILYS RESPONSE TO PLAN OF CARE: Pt presents with dementia and unable to provide assessment information. Daughter reports pt from [facility] and would like pt to return when stable. Daughter states she will provide transportation.

## 2011-12-28 NOTE — Progress Notes (Signed)
Pt d/c back to [facility], report called to facility, pt dtr taking her back to facility.  Ninetta Lights RN

## 2011-12-28 NOTE — Care Management Note (Unsigned)
    Page 1 of 1   12/28/2011     10:56:00 AM   CARE MANAGEMENT NOTE 12/28/2011  Patient:  Valerie Chen, Valerie Chen   Account Number:  [id number]  Date Initiated:  12/28/2011  Documentation initiated by:  SIMMONS,Lashala Laser  Subjective/Objective Assessment:   ADMITTED WITH CHEST PAIN;  LIVES AT [facility].     Action/Plan:   DISCHARGE PLANNING.   Anticipated DC Date:  12/28/2011   Anticipated DC Plan:  ASSISTED LIVING / REST HOME  In-house referral  Clinical Social Worker      DC Planning Services  CM consult      Choice offered to / List presented to:             Status of service:  In process, will continue to follow Medicare Important Message given?   (If response is "NO", the following Medicare IM given date fields will be blank) Date Medicare IM given:   Date Additional Medicare IM given:    Discharge Disposition:    Per UR Regulation:  Reviewed for med. necessity/level of care/duration of stay  If discussed at Long Length of Stay Meetings, dates discussed:    Comments:  12/28/11  1055  Johnthomas Lader SIMMONS RN, BSN [phone] NCM WILL FOLLOW.

## 2011-12-28 NOTE — Progress Notes (Signed)
Physical Therapy Treatment Patient Details Name: Valerie Chen MRN: [id number] DOB: 03-04-1929 Today's Date: 12/28/2011 Time: 1324-4010 PT Time Calculation (min): 16 min  PT Assessment / Plan / Recommendation Comments on Treatment Session  Pt from ALF admitted with CP. No CP or difficulty with mobility today and balance improved from yesterday. Anticipate pt is at or very near baseline but requires supervision for mobility due to cognitive deficits. Will follow to maximize independence.     Follow Up Recommendations  Home health PT    Barriers to Discharge        Equipment Recommendations       Recommendations for Other Services    Frequency     Plan Frequency remains appropriate;Discharge plan needs to be updated    Precautions / Restrictions Precautions Precautions: Fall Restrictions Weight Bearing Restrictions: No   Pertinent Vitals/Pain No pain    Mobility  Bed Mobility Bed Mobility: Not assessed Transfers Transfers: Sit to Stand;Stand to Sit Sit to Stand: 6: Modified independent (Device/Increase time);From bed Stand to Sit: 6: Modified independent (Device/Increase time);To bed Ambulation/Gait Ambulation/Gait Assistance: 5: Supervision Ambulation Distance (Feet): 550 Feet Assistive device: None Ambulation/Gait Assistance Details: Pt able to perform head turns, change of direction and change of speed with only minor change to gait during head turns but no LOB Gait Pattern: Within Functional Limits Gait velocity: 82ft/17sec=1.76 Stairs: No    Exercises     PT Diagnosis:    PT Problem List:   PT Treatment Interventions:     PT Goals Acute Rehab PT Goals PT Goal: Sit to Stand - Progress: Progressing toward goal Pt will Ambulate: >150 feet;with modified independence PT Goal: Ambulate - Progress: Updated due to goal met  Visit Information  Last PT Received On: 12/28/11 Assistance Needed: +1    Subjective Data  Subjective: Oh, I have a dog I take care of      Cognition  Overall Cognitive Status: History of cognitive impairments - at baseline Orientation Level: Disoriented to;Place;Time    Balance  Static Sitting Balance Static Sitting - Balance Support: No upper extremity supported;Feet supported Static Sitting - Level of Assistance: 7: Independent Static Sitting - Comment/# of Minutes: 4 Static Standing Balance Static Standing - Comment/# of Minutes: 2 Single Leg Stance - Right Leg: 3  Single Leg Stance - Left Leg: 7  Tandem Stance - Right Leg: 30  Tandem Stance - Left Leg: 30  Rhomberg - Eyes Closed: 30  High Level Balance High Level Balance Activites: Braiding;Backward walking;Direction changes;Head turns High Level Balance Comments: Pt able to perform bil braiding and backward walking x 30', head turns throughout gait  End of Session PT - End of Session Activity Tolerance: Patient tolerated treatment well Patient left: in bed;with bed alarm set (EOB) Nurse Communication: Mobility status   GP     Toney Sang Beth 12/28/2011, 8:49 AM Delaney Meigs, PT [phone]

## 2011-12-28 NOTE — Discharge Summary (Signed)
I have reviewed this discharge summary and agree.    

## 2011-12-28 NOTE — Progress Notes (Signed)
Clinical Child psychotherapist (CSW) prepared and placed [employer] in pt wall a roo. CSW faxed and confirmed fax with [facility] who informed CSW that pt okay to transfer. CSW provided RN with phone number to give report. CSW left a vm for daughter that pt ready for transport to facility. No further CSW needs CSW signing off.  Theresia Bough, MSW, [facility]res[facility] Majors [phone]

## 2012-01-06 ENCOUNTER — Ambulatory Visit: Payer: Medicare Other | Admitting: Physician Assistant

## 2012-01-12 ENCOUNTER — Ambulatory Visit (INDEPENDENT_AMBULATORY_CARE_PROVIDER_SITE_OTHER): Payer: Medicare Other | Admitting: Physician Assistant

## 2012-01-12 ENCOUNTER — Encounter: Payer: Self-pay | Admitting: Physician Assistant

## 2012-01-12 VITALS — BP 136/51 | HR 71 | Ht 63.0 in | Wt 182.0 lb

## 2012-01-12 DIAGNOSIS — K219 Gastro-esophageal reflux disease without esophagitis: Secondary | ICD-10-CM

## 2012-01-12 DIAGNOSIS — I2510 Atherosclerotic heart disease of native coronary artery without angina pectoris: Secondary | ICD-10-CM

## 2012-01-12 DIAGNOSIS — R079 Chest pain, unspecified: Secondary | ICD-10-CM

## 2012-01-12 NOTE — Patient Instructions (Addendum)
Your physician has recommended you make the following change in your medication: INCREASE IMDUR 60 MG IN THE MORNING AND 30 MG IN THE EVENING, MAKE SURE TO TAKE THESE 12 HOURS APART  INCREASE PROTONIX 40 MG 30 MINUTES BEFORE BREAKFAST  2 WEEKS WITH [facility], PAC SAME DAY DR. KATZ IS IN THE OFFICE 01/27/12 3:20 pm

## 2012-01-12 NOTE — Progress Notes (Signed)
[address] [place], [place]  [address] Phone: [phone] Fax:  [phone]  Date:  01/12/2012   Name:  Valerie Chen   DOB:  25-Jun-1928   MRN:  [id number]  PCP:  Valerie Peri, MD  Primary Cardiologist:  Dr. Zackery Chen  Primary Electrophysiologist:  None    History of Present Illness: Valerie Chen is a 78 y.o. female who returns for evaluation of chest pain.    She was initially seen by Dr. Myrtis Chen in 08/2010 at [facility] with abdominal and chest pain.  She ruled out for myocardial infarction.  Nuclear scan raised the possibility of anteroseptal and inferior septal ischemia.  She had normal wall motion and normal LVF on echo.  It is presumed that she has CAD but no proof.  She did have a GI bleed complicating her hospitalization.  She has dementia.  She has been treated medically over time.  Last seen by Dr. Myrtis Chen 11/2010.  Admitted to [facility] 7/7-7/8 with chest pain.  MI ruled out.  CXR was ok and ECG was non-acute.  She stays at [facility].  Here with her daughter.  Hx provided by daughter mainly due to dementia.  Patient notes substernal chest pressure with assoc jaw and left arm pain, dyspnea, nausea and diaphoresis.  Has had symptoms off and on for over a year now.  Somewhat worse now.  Experiences at rest and with exertion.  Can also exert herself without pain.  No orthopnea, PND, edema.  No syncope.  Felt poorly recently with initiation of Lisinopril and notes low back pain.  Lisinopril d/c'd.  Also notes recent vomiting and water brash symptoms.    Wt Readings from Last 3 Encounters:  01/12/12 182 lb (82.555 kg)  12/26/11 179 lb (81.194 kg)  03/03/11 160 lb (72.576 kg)     Potassium  Date/Time Value Range Status  12/27/2011  6:47 AM 3.7  3.5 - 5.1 mEq/L Final     Creatinine, Chen  Date/Time Value Range Status  12/27/2011  6:47 AM 0.66  0.50 - 1.10 mg/dL Final   Hemoglobin  Date/Time Value Range Status  12/27/2011  6:47 AM 13.8  12.0 - 15.0 g/dL  Final    Past Medical History  Diagnosis Date  . Chest pain     Hospital February, 2012, nuclear, possible anteroseptal and inferoseptal ischemia, technically difficult, medical therapy  . Dyslipidemia   . Orthostatic hypotension   . Migraines     25 year  . Dementia     Significant  . Ejection fraction     EF 55%, echo, February, 2012  . Diastolic dysfunction     Echo, February, 2012  . TIA (transient ischemic attack)        ???question of a TIA in February, 2012, carotid Doppler, August 11, 2010 no significant abnormalities  . GI bleed     February, 2012 incomplete colonoscopy, hemorrhoids, needs complete colonoscopy  . CAD (coronary artery disease)     Probable CAD, nuclear February, 2012, possible anteroseptal and inferoseptal ischemia but the study was technically difficult  . Hyperlipidemia     Current Outpatient Prescriptions  Medication Sig Dispense Refill  . amLODipine (NORVASC) 5 MG tablet Take 1 tablet (5 mg total) by mouth daily.  30 tablet  0  . aspirin (ASPIR-LOW) 81 MG EC tablet Take 81 mg by mouth daily.        . Cholecalciferol (VITAMIN D) 2000 UNITS tablet Take 2,000 Units by mouth daily.      Marland Kitchen  cyanocobalamin ([employer] VITAMIN B12) 1000 MCG tablet Take 1,000 mcg by mouth daily.       Marland Kitchen HYDROcodone-acetaminophen (NORCO/VICODIN) 5-325 MG per tablet Take 1 tablet by mouth every 6 (six) hours as needed.      . isosorbide mononitrate (IMDUR) 60 MG 24 hr tablet Take 60 mg by mouth at bedtime.       . mirtazapine (REMERON) 15 MG tablet Take 1 tablet by mouth at bedtime.      . nitroGLYCERIN (NITROSTAT) 0.4 MG SL tablet Place 0.4 mg under the tongue every 5 (five) minutes as needed. May repeat up to 3 times.  If after 3rd dose chest pain persists, call 911 and proceed to ER.       . pantoprazole (PROTONIX) 20 MG tablet Take 20 mg by mouth daily.      . pravastatin (PRAVACHOL) 20 MG tablet Take 20 mg by mouth daily.        . promethazine (PHENERGAN) 12.5 MG tablet Take  12.5 mg by mouth every 6 (six) hours as needed. For nausea      . QUEtiapine (SEROQUEL) 25 MG tablet Take 12.5 mg by mouth at bedtime.        . traMADol (ULTRAM) 50 MG tablet Take 50 mg by mouth 3 (three) times daily as needed. pain        Allergies: No Known Allergies  History  Substance Use Topics  . Smoking status: Never Smoker   . Smokeless tobacco: Not on file  . Alcohol Use: No     ROS:  Please see the history of present illness.   No melena, hematochezia, dysphagia, odynophagia.  Not sure if CP assoc with meals.   All other systems reviewed and negative.   PHYSICAL EXAM: VS:  BP 136/51  Pulse 71  Ht 5\' 3"  (1.6 m)  Wt 182 lb (82.555 kg)  BMI 32.24 kg/m2 Well nourished, well developed, in no acute distress HEENT: normal Neck: no JVD at 90 degrees Cardiac:  normal S1, S2; RRR; no murmur Lungs:  clear to auscultation bilaterally, no wheezing, rhonchi or rales Abd: soft, nontender, no hepatomegaly Ext: no edema Skin: warm and dry Neuro:  CNs 2-12 intact, no focal abnormalities noted  EKG:  NSR, HR 68, low voltage, no acute changes      ASSESSMENT AND PLAN:  1.  Chest Pain She has presumed CAD. Symptoms are quite concerning for progressive angina. She is not interested in pursuing cardiac cath.  Her daughter also wants to avoid this. Dr. Myrtis Chen has also tried to avoid LHC. Long d/w patient and her daughter today.  In the end, we decided to advance medical Rx with close follow up. Increase Imdur to 60 mg in AM and 30 mg in PM. Also adjust Protonix to 40 mg QD to cover GI etiology. Follow up in 2-3 weeks with Dr. Zackery Chen. Go to the ED if symptoms worsen.  2.  GERD Adjust PPI as noted.  Signed, Tereso Newcomer, PA-C  3:09 PM 01/12/2012

## 2012-01-27 ENCOUNTER — Ambulatory Visit (INDEPENDENT_AMBULATORY_CARE_PROVIDER_SITE_OTHER): Payer: Medicare Other | Admitting: Physician Assistant

## 2012-01-27 ENCOUNTER — Encounter: Payer: Self-pay | Admitting: Physician Assistant

## 2012-01-27 VITALS — BP 133/67 | HR 74 | Ht 64.0 in | Wt 184.4 lb

## 2012-01-27 DIAGNOSIS — R079 Chest pain, unspecified: Secondary | ICD-10-CM

## 2012-01-27 MED ORDER — AMLODIPINE BESYLATE 10 MG PO TABS: 10.0000 mg | ORAL_TABLET | Freq: Every day | ORAL | Status: DC

## 2012-01-27 NOTE — Progress Notes (Signed)
[address] [place], [place]  [address] Phone: [phone] Fax:  [phone]  Date:  01/27/2012   Name:  Valerie Chen   DOB:  [birth year]   MRN:  [id number]  PCP:  Marletta Lor, NP  Primary Cardiologist:  Dr. Zackery Barefoot  Primary Electrophysiologist:  None    History of Present Illness: Valerie Chen is a 78 y.o. female who returns for f/u on chest pain.    She was initially seen by Dr. Myrtis Ser in 08/2010 at [facility] with abdominal and chest pain.  She ruled out for myocardial infarction.  Nuclear scan raised the possibility of anteroseptal and inferior septal ischemia.  She had normal wall motion and normal LVF on echo.  It is presumed that she has CAD but no proof.  She did have a GI bleed complicating her hospitalization.  She has dementia.  She has been treated medically over time.  Last seen by Dr. Myrtis Ser 11/2010.  I saw her 7/24 after a recent admission to the hospital with chest pain. She resides at [facility]. I was concerned that her chest pain represented progressive angina. The patient remained un-interested in proceeding with cardiac catheterization. I advanced her anti-anginal therapy by increasing her isosorbide. Today she tells me that she is feeling better. She is here with her daughter who helps with most of her history. Her daughter states that she is more active at the nursing home and does not seem to complain as much of chest pain. She denies jaw or arm pain. She denies syncope. She denies orthopnea or PND. She denies significant dyspnea.     Past Medical History  Diagnosis Date  . Chest pain     Hospital February, 2012, nuclear, possible anteroseptal and inferoseptal ischemia, technically difficult, medical therapy  . Dyslipidemia   . Orthostatic hypotension   . Migraines     25 year  . Dementia     Significant  . Ejection fraction     EF 55%, echo, February, 2012  . Diastolic dysfunction     Echo, February, 2012  . TIA  (transient ischemic attack)        ???question of a TIA in February, 2012, carotid Doppler, August 11, 2010 no significant abnormalities  . GI bleed     February, 2012 incomplete colonoscopy, hemorrhoids, needs complete colonoscopy  . CAD (coronary artery disease)     Probable CAD, nuclear February, 2012, possible anteroseptal and inferoseptal ischemia but the study was technically difficult  . Hyperlipidemia     Current Outpatient Prescriptions  Medication Sig Dispense Refill  . amLODipine (NORVASC) 5 MG tablet Take 1 tablet (5 mg total) by mouth daily.  30 tablet  0  . aspirin (ASPIR-LOW) 81 MG EC tablet Take 81 mg by mouth daily.        . Cholecalciferol (VITAMIN D) 2000 UNITS tablet Take 2,000 Units by mouth daily.      . cyanocobalamin ([employer]S VITAMIN B12) 1000 MCG tablet Take 1,000 mcg by mouth daily.       Marland Kitchen HYDROcodone-acetaminophen (NORCO/VICODIN) 5-325 MG per tablet Take 1 tablet by mouth every 6 (six) hours as needed.      . hyoscyamine (ANASPAZ) 0.125 MG TBDP Place under the tongue every 6 (six) hours as needed.      . isosorbide mononitrate (IMDUR) 60 MG 24 hr tablet TAKE 60 MG IN THE MORNING AND TAKE 30 MG IN THE EVENING; MAKE SURE TO TAKE THESE 12 HOURS APART      .  methocarbamol (ROBAXIN) 500 MG tablet Take 500 mg by mouth every 12 (twelve) hours as needed. Only for 7 days.      . mirtazapine (REMERON) 15 MG tablet Take 1 tablet by mouth at bedtime.      . nitroGLYCERIN (NITROSTAT) 0.4 MG SL tablet Place 0.4 mg under the tongue every 5 (five) minutes as needed. May repeat up to 3 times.  If after 3rd dose chest pain persists, call 911 and proceed to ER.       . pantoprazole (PROTONIX) 40 MG tablet MAKE SURE TO TAKE 30 MINUTES BEFORE YOUR BREAKFAST      . pravastatin (PRAVACHOL) 20 MG tablet Take 20 mg by mouth daily.        . promethazine (PHENERGAN) 12.5 MG tablet Take 12.5 mg by mouth every 6 (six) hours as needed. For nausea      . QUEtiapine (SEROQUEL) 25 MG tablet Take  12.5 mg by mouth at bedtime.        . traMADol (ULTRAM) 50 MG tablet Take 50 mg by mouth 3 (three) times daily as needed. pain        Allergies: No Known Allergies  History  Substance Use Topics  . Smoking status: Never Smoker   . Smokeless tobacco: Never Used  . Alcohol Use: No     ROS:  Please see the history of present illness.   All other systems reviewed and negative.   PHYSICAL EXAM: VS:  BP 133/67  Pulse 74  Ht 5\' 4"  (1.626 m)  Wt 184 lb 6.4 oz (83.643 kg)  BMI 31.65 kg/m2 Well nourished, well developed, in no acute distress HEENT: normal Neck: no JVD at 90 degrees Cardiac:  normal S1, S2; RRR; no murmur Lungs:  clear to auscultation bilaterally, no wheezing, rhonchi or rales Abd: soft, nontender, no hepatomegaly Ext: Trace to 1+ bilateral LE edema Skin: warm and dry Neuro:  CNs 2-12 intact, no focal abnormalities noted  EKG:  NSR, HR 69, low voltage, no acute changes      ASSESSMENT AND PLAN:  1.  Chest Pain Her symptoms have improved. I will continue to advance her antianginals by increasing her amlodipine to 10 mg a day. She continues to prefer medical therapy for probable CAD. Her daughter agrees. She will be brought back in followup with Dr. Myrtis Ser in the next 2 months.  Luna Glasgow, PA-C  3:51 PM 01/27/2012

## 2012-01-27 NOTE — Patient Instructions (Addendum)
Your physician has recommended you make the following change in your medication: INCREASE NORVASC TO 10 MG DAILY  Your physician recommends that you schedule a follow-up appointment in: 6-8 WEEKS WITH DR. KATZ  NO OTHER CHANGES WERE MADE TODAY

## 2012-02-24 ENCOUNTER — Emergency Department (HOSPITAL_COMMUNITY): Payer: Medicare Other

## 2012-02-24 ENCOUNTER — Encounter (HOSPITAL_COMMUNITY): Payer: Self-pay | Admitting: *Deleted

## 2012-02-24 ENCOUNTER — Observation Stay (HOSPITAL_COMMUNITY)
Admission: EM | Admit: 2012-02-24 | Discharge: 2012-02-26 | Payer: Medicare Other | Attending: Internal Medicine | Admitting: Internal Medicine

## 2012-02-24 DIAGNOSIS — E785 Hyperlipidemia, unspecified: Secondary | ICD-10-CM | POA: Diagnosis present

## 2012-02-24 DIAGNOSIS — F329 Major depressive disorder, single episode, unspecified: Secondary | ICD-10-CM | POA: Insufficient documentation

## 2012-02-24 DIAGNOSIS — G43909 Migraine, unspecified, not intractable, without status migrainosus: Secondary | ICD-10-CM

## 2012-02-24 DIAGNOSIS — I5189 Other ill-defined heart diseases: Secondary | ICD-10-CM

## 2012-02-24 DIAGNOSIS — R9430 Abnormal result of cardiovascular function study, unspecified: Secondary | ICD-10-CM

## 2012-02-24 DIAGNOSIS — R0602 Shortness of breath: Secondary | ICD-10-CM | POA: Insufficient documentation

## 2012-02-24 DIAGNOSIS — G459 Transient cerebral ischemic attack, unspecified: Secondary | ICD-10-CM

## 2012-02-24 DIAGNOSIS — I951 Orthostatic hypotension: Secondary | ICD-10-CM

## 2012-02-24 DIAGNOSIS — Z66 Do not resuscitate: Secondary | ICD-10-CM | POA: Insufficient documentation

## 2012-02-24 DIAGNOSIS — R609 Edema, unspecified: Secondary | ICD-10-CM | POA: Insufficient documentation

## 2012-02-24 DIAGNOSIS — F3289 Other specified depressive episodes: Secondary | ICD-10-CM | POA: Insufficient documentation

## 2012-02-24 DIAGNOSIS — F0390 Unspecified dementia without behavioral disturbance: Secondary | ICD-10-CM | POA: Insufficient documentation

## 2012-02-24 DIAGNOSIS — I10 Essential (primary) hypertension: Secondary | ICD-10-CM | POA: Insufficient documentation

## 2012-02-24 DIAGNOSIS — I2510 Atherosclerotic heart disease of native coronary artery without angina pectoris: Secondary | ICD-10-CM | POA: Diagnosis present

## 2012-02-24 DIAGNOSIS — R600 Localized edema: Secondary | ICD-10-CM

## 2012-02-24 DIAGNOSIS — K922 Gastrointestinal hemorrhage, unspecified: Secondary | ICD-10-CM

## 2012-02-24 DIAGNOSIS — R079 Chest pain, unspecified: Principal | ICD-10-CM | POA: Diagnosis present

## 2012-02-24 LAB — URINALYSIS, MICROSCOPIC ONLY
Glucose, UA: NEGATIVE mg/dL
Ketones, ur: NEGATIVE mg/dL
Leukocytes, UA: NEGATIVE
Nitrite: NEGATIVE
Specific Gravity, Urine: 1.011 (ref 1.005–1.030)
pH: 6.5 (ref 5.0–8.0)

## 2012-02-24 LAB — POCT I-STAT TROPONIN I

## 2012-02-24 LAB — CBC WITH DIFFERENTIAL/PLATELET
Basophils Absolute: 0 10*3/uL (ref 0.0–0.1)
Eosinophils Absolute: 0 10*3/uL (ref 0.0–0.7)
Eosinophils Relative: 0 % (ref 0–5)
Lymphs Abs: 2.2 10*3/uL (ref 0.7–4.0)
MCH: 30.7 pg (ref 26.0–34.0)
Neutrophils Relative %: 46 % (ref 43–77)
Platelets: 170 10*3/uL (ref 150–400)
RBC: 4.89 MIL/uL (ref 3.87–5.11)
RDW: 12.8 % (ref 11.5–15.5)
WBC: 4.8 10*3/uL (ref 4.0–10.5)

## 2012-02-24 LAB — COMPREHENSIVE METABOLIC PANEL
ALT: 21 U/L (ref 0–35)
AST: 23 U/L (ref 0–37)
Albumin: 4 g/dL (ref 3.5–5.2)
Alkaline Phosphatase: 75 U/L (ref 39–117)
Calcium: 10.1 mg/dL (ref 8.4–10.5)
Glucose, Bld: 102 mg/dL — ABNORMAL HIGH (ref 70–99)
Potassium: 4.4 mEq/L (ref 3.5–5.1)
Sodium: 139 mEq/L (ref 135–145)
Total Protein: 7.4 g/dL (ref 6.0–8.3)

## 2012-02-24 MED ORDER — NITROGLYCERIN 2 % TD OINT
0.5000 [in_us] | TOPICAL_OINTMENT | TRANSDERMAL | Status: AC
  Administered 2012-02-24: 0.5 [in_us] via TOPICAL
  Filled 2012-02-24: qty 1

## 2012-02-24 NOTE — ED Notes (Signed)
Pt in via [facility], pt from [facility], pt c/o non radiating Substernal CP, pt c/o SOB, Pt denies n/v/d, pt received 324 ASA & x2 SL nitro, pt denies CP upon arrival to ED, pt A&O x4, follows commands, speaks in complete sentences

## 2012-02-24 NOTE — ED Notes (Signed)
States her legs have been swelling. Started two days ago. Denies pain at this time

## 2012-02-24 NOTE — ED Provider Notes (Signed)
I saw and evaluated the patient, reviewed the resident's note and I agree with the findings and plan.   Loren Racer, MD 02/24/12 [phone]

## 2012-02-24 NOTE — ED Provider Notes (Signed)
History     CSN: [id number]  Arrival date & time 02/24/12  1939   First MD Initiated Contact with Patient 02/24/12 1946      Chief Complaint  Patient presents with  . Chest Pain    (Consider location/radiation/quality/duration/timing/severity/associated sxs/prior treatment) Patient is a 78 y.o. female presenting with chest pain. The history is provided by the patient.  Chest Pain The chest pain began 3 - 5 hours ago. Chest pain occurs constantly. The chest pain is improving. Associated with: unknown. At its most intense, the pain is at 8/10. The pain is currently at 5/10. The severity of the pain is mild. The quality of the pain is described as pressure-like. The pain does not radiate. Primary symptoms include shortness of breath (mild). Pertinent negatives for primary symptoms include no fever, no fatigue, no cough, no abdominal pain, no nausea, no vomiting and no dizziness. She tried nitroglycerin for the symptoms. Risk factors: HLP,      Past Medical History  Diagnosis Date  . Chest pain     Hospital February, 2012, nuclear, possible anteroseptal and inferoseptal ischemia, technically difficult, medical therapy  . Dyslipidemia   . Orthostatic hypotension   . Migraines     25 year  . Dementia     Significant  . Ejection fraction     EF 55%, echo, February, 2012  . Diastolic dysfunction     Echo, February, 2012  . TIA (transient ischemic attack)        ???question of a TIA in February, 2012, carotid Doppler, August 11, 2010 no significant abnormalities  . GI bleed     February, 2012 incomplete colonoscopy, hemorrhoids, needs complete colonoscopy  . CAD (coronary artery disease)     Probable CAD, nuclear February, 2012, possible anteroseptal and inferoseptal ischemia but the study was technically difficult  . Hyperlipidemia     Past Surgical History  Procedure Date  . Abdominal hysterectomy   . Diagnostic mammogram 12/29/2000    Mammogram, suspicious lesions -BX  pending    Family History  Problem Relation Age of Onset  . Heart attack Father     several heart attacks  . Hypertension Daughter     History  Substance Use Topics  . Smoking status: Never Smoker   . Smokeless tobacco: Never Used  . Alcohol Use: No    OB History    Grav Para Term Preterm Abortions TAB SAB Ect Mult Living                  Review of Systems  Constitutional: Negative for fever and fatigue.  HENT: Negative for congestion, drooling and neck pain.   Eyes: Negative for pain.  Respiratory: Positive for shortness of breath (mild). Negative for cough.   Cardiovascular: Positive for chest pain.  Gastrointestinal: Negative for nausea, vomiting, abdominal pain and diarrhea.  Genitourinary: Negative for dysuria and hematuria.  Musculoskeletal: Negative for back pain and gait problem.  Skin: Negative for color change.  Neurological: Negative for dizziness and headaches.  Hematological: Negative for adenopathy.  Psychiatric/Behavioral: Negative for behavioral problems.  All other systems reviewed and are negative.    Allergies  Review of patient's allergies indicates no known allergies.  Home Medications   Current Outpatient Rx  Name Route Sig Dispense Refill  . AMLODIPINE BESYLATE 10 MG PO TABS Oral Take 1 tablet (10 mg total) by mouth daily.    . ASPIRIN 81 MG PO TBEC Oral Take 81 mg by mouth daily.      Marland Kitchen  VITAMIN D 2000 UNITS PO TABS Oral Take 2,000 Units by mouth daily.    . CYANOCOBALAMIN 1000 MCG PO TABS Oral Take 1,000 mcg by mouth daily.     Marland Kitchen HYDROCODONE-ACETAMINOPHEN 5-325 MG PO TABS Oral Take 1 tablet by mouth every 6 (six) hours as needed.    Marland Kitchen HYOSCYAMINE SULFATE 0.125 MG PO TBDP Sublingual Place under the tongue every 6 (six) hours as needed.    . ISOSORBIDE MONONITRATE ER 60 MG PO TB24  TAKE 60 MG IN THE MORNING AND TAKE 30 MG IN THE EVENING; MAKE SURE TO TAKE THESE 12 HOURS APART    . METHOCARBAMOL 500 MG PO TABS Oral Take 500 mg by mouth every  12 (twelve) hours as needed. Only for 7 days.    Marland Kitchen MIRTAZAPINE 15 MG PO TABS Oral Take 1 tablet by mouth at bedtime.    Marland Kitchen NITROGLYCERIN 0.4 MG SL SUBL Sublingual Place 0.4 mg under the tongue every 5 (five) minutes as needed. May repeat up to 3 times.  If after 3rd dose chest pain persists, call 911 and proceed to ER.     Marland Kitchen PANTOPRAZOLE SODIUM 40 MG PO TBEC  MAKE SURE TO TAKE 30 MINUTES BEFORE YOUR BREAKFAST    . PRAVASTATIN SODIUM 20 MG PO TABS Oral Take 20 mg by mouth daily.      Marland Kitchen PROMETHAZINE HCL 12.5 MG PO TABS Oral Take 12.5 mg by mouth every 6 (six) hours as needed. For nausea    . QUETIAPINE FUMARATE 25 MG PO TABS Oral Take 12.5 mg by mouth at bedtime.      . TRAMADOL HCL 50 MG PO TABS Oral Take 50 mg by mouth 3 (three) times daily as needed. pain      BP 124/56  Pulse 63  Temp 98.8 F (37.1 C) (Oral)  Resp 18  SpO2 98%  Physical Exam  Nursing note and vitals reviewed. Constitutional: She is oriented to person, place, and time. She appears well-developed and well-nourished.  HENT:  Head: Normocephalic.  Mouth/Throat: Oropharynx is clear and moist. No oropharyngeal exudate.  Eyes: Conjunctivae and EOM are normal. Pupils are equal, round, and reactive to light.  Neck: Normal range of motion. Neck supple.  Cardiovascular: Normal rate, regular rhythm, normal heart sounds and intact distal pulses.  Exam reveals no gallop and no friction rub.   No murmur heard. Pulmonary/Chest: Effort normal and breath sounds normal. No respiratory distress. She has no wheezes.  Abdominal: Soft. Bowel sounds are normal. There is no tenderness. There is no rebound and no guarding.  Musculoskeletal: Normal range of motion. She exhibits edema (mild to mod edema in bilat LE's extending to knees). She exhibits no tenderness.  Neurological: She is alert and oriented to person, place, and time.  Skin: Skin is warm and dry.  Psychiatric: She has a normal mood and affect. Her behavior is normal.    ED  Course  Procedures (including critical care time)  Labs Reviewed  COMPREHENSIVE METABOLIC PANEL - Abnormal; Notable for the following:    Glucose, Bld 102 (*)     GFR calc non Af Amer 63 (*)     GFR calc Af Amer 74 (*)     All other components within normal limits  CBC WITH DIFFERENTIAL  PRO B NATRIURETIC PEPTIDE  POCT I-STAT TROPONIN I  URINALYSIS, WITH MICROSCOPIC   Dg Chest 2 View  02/24/2012  *RADIOLOGY REPORT*  Clinical Data: Chest pain, dizziness, swelling and legs  CHEST - 2  VIEW  Comparison: 12/26/2011  Findings: Upper-normal size of cardiac silhouette. Tortuous aorta. Mediastinal contours and pulmonary vascularity normal. Minimal chronic bronchitic changes. No pulmonary infiltrate, pleural effusion, or pneumothorax. Bones demineralized.  IMPRESSION: Chronic bronchitic changes. No acute abnormalities.   Original Report Authenticated By: Lollie Marrow, M.D.      1. Chest pain      Date: 02/24/2012  Rate: 63  Rhythm: normal sinus rhythm  QRS Axis: normal  Intervals: normal  ST/T Wave abnormalities: normal  Conduction Disutrbances:Multiple PAC's  Narrative Interpretation:   Old EKG Reviewed: changes noted    MDM  11:03 PM 78 y.o. female w hx of TIA, gi bleed, dementia, diastolic dysf, CAD pw chest pressure that began earlier this afternoon and has lasted for several hours. Pt also notes mild sob. Pt AFVSS here, got 325 asa and 2 nitro per ems which helped dec the discomfort. Will get labs, nitro paste.   Labs/imaging non-contrib. Will admit for CP r/o to hospitalist.   Clinical Impression 1. Chest pain           Purvis Sheffield, MD 02/24/12 2303

## 2012-02-25 ENCOUNTER — Inpatient Hospital Stay (HOSPITAL_COMMUNITY): Payer: Medicare Other

## 2012-02-25 ENCOUNTER — Encounter (HOSPITAL_COMMUNITY): Payer: Self-pay | Admitting: Radiology

## 2012-02-25 DIAGNOSIS — I2510 Atherosclerotic heart disease of native coronary artery without angina pectoris: Secondary | ICD-10-CM

## 2012-02-25 DIAGNOSIS — R079 Chest pain, unspecified: Principal | ICD-10-CM

## 2012-02-25 DIAGNOSIS — E785 Hyperlipidemia, unspecified: Secondary | ICD-10-CM

## 2012-02-25 DIAGNOSIS — R609 Edema, unspecified: Secondary | ICD-10-CM

## 2012-02-25 DIAGNOSIS — I059 Rheumatic mitral valve disease, unspecified: Secondary | ICD-10-CM

## 2012-02-25 DIAGNOSIS — R600 Localized edema: Secondary | ICD-10-CM | POA: Diagnosis present

## 2012-02-25 DIAGNOSIS — K922 Gastrointestinal hemorrhage, unspecified: Secondary | ICD-10-CM

## 2012-02-25 LAB — BASIC METABOLIC PANEL
CO2: 28 mEq/L (ref 19–32)
Chloride: 103 mEq/L (ref 96–112)
GFR calc Af Amer: >90 mL/min (ref >90)
Sodium: 139 mEq/L (ref 135–145)

## 2012-02-25 LAB — TROPONIN I: Troponin I: <0.3 ng/mL (ref <0.30)

## 2012-02-25 LAB — CBC
HCT: 41.8 % (ref 36.0–46.0)
MCV: 89.1 fL (ref 78.0–100.0)
Platelets: 156 10*3/uL (ref 150–400)
RBC: 4.69 MIL/uL (ref 3.87–5.11)
WBC: 4.8 10*3/uL (ref 4.0–10.5)

## 2012-02-25 MED ORDER — HEPARIN SODIUM (PORCINE) 5000 UNIT/ML IJ SOLN
5000.0000 [IU] | Freq: Three times a day (TID) | INTRAMUSCULAR | Status: DC
  Filled 2012-02-25: qty 1

## 2012-02-25 MED ORDER — ASPIRIN 81 MG PO TBEC: 81.0000 mg | DELAYED_RELEASE_TABLET | Freq: Every day | ORAL | Status: DC

## 2012-02-25 MED ORDER — POLYETHYLENE GLYCOL 3350 17 G PO PACK: 17.0000 g | PACK | Freq: Every day | ORAL | Status: DC | PRN

## 2012-02-25 MED ORDER — VITAMIN D 50 MCG (2000 UT) PO TABS: 2000.0000 [IU] | ORAL_TABLET | Freq: Every day | ORAL | Status: DC

## 2012-02-25 MED ORDER — NITROGLYCERIN 0.4 MG SL SUBL: 0.4000 mg | SUBLINGUAL_TABLET | SUBLINGUAL | Status: DC | PRN

## 2012-02-25 MED ORDER — ASPIRIN EC 81 MG PO TBEC
81.0000 mg | DELAYED_RELEASE_TABLET | Freq: Every day | ORAL | Status: DC
  Administered 2012-02-25 – 2012-02-26 (×2): 81 mg via ORAL
  Filled 2012-02-25 (×2): qty 1

## 2012-02-25 MED ORDER — PANTOPRAZOLE SODIUM 40 MG PO TBEC
40.0000 mg | DELAYED_RELEASE_TABLET | Freq: Every day | ORAL | Status: DC
  Administered 2012-02-25 – 2012-02-26 (×2): 40 mg via ORAL
  Filled 2012-02-25 (×2): qty 1

## 2012-02-25 MED ORDER — SIMVASTATIN 20 MG PO TABS
20.0000 mg | ORAL_TABLET | Freq: Every day | ORAL | Status: DC
  Administered 2012-02-25: 20 mg via ORAL
  Filled 2012-02-25 (×2): qty 1

## 2012-02-25 MED ORDER — ONDANSETRON HCL 4 MG/2ML IJ SOLN: 4.0000 mg | Freq: Four times a day (QID) | INTRAMUSCULAR | Status: DC | PRN

## 2012-02-25 MED ORDER — FUROSEMIDE 20 MG PO TABS
20.0000 mg | ORAL_TABLET | Freq: Two times a day (BID) | ORAL | Status: DC
  Administered 2012-02-25 – 2012-02-26 (×2): 20 mg via ORAL
  Filled 2012-02-25 (×4): qty 1

## 2012-02-25 MED ORDER — VITAMIN B-12 1000 MCG PO TABS
1000.0000 ug | ORAL_TABLET | Freq: Every day | ORAL | Status: DC
  Administered 2012-02-25 – 2012-02-26 (×2): 1000 ug via ORAL
  Filled 2012-02-25 (×2): qty 1

## 2012-02-25 MED ORDER — ISOSORBIDE MONONITRATE 15 MG HALF TABLET
15.0000 mg | ORAL_TABLET | Freq: Every day | ORAL | Status: DC
  Administered 2012-02-25 – 2012-02-26 (×2): 15 mg via ORAL
  Filled 2012-02-25 (×2): qty 1

## 2012-02-25 MED ORDER — SODIUM CHLORIDE 0.9 % IV SOLN
INTRAVENOUS | Status: AC
  Administered 2012-02-25: 05:00:00 via INTRAVENOUS

## 2012-02-25 MED ORDER — ONDANSETRON HCL 4 MG PO TABS: 4.0000 mg | ORAL_TABLET | Freq: Four times a day (QID) | ORAL | Status: DC | PRN

## 2012-02-25 MED ORDER — POTASSIUM CHLORIDE CRYS ER 20 MEQ PO TBCR
20.0000 meq | EXTENDED_RELEASE_TABLET | Freq: Two times a day (BID) | ORAL | Status: DC
  Administered 2012-02-25 – 2012-02-26 (×2): 20 meq via ORAL
  Filled 2012-02-25 (×3): qty 1

## 2012-02-25 MED ORDER — IOHEXOL 350 MG/ML SOLN
100.0000 mL | Freq: Once | INTRAVENOUS | Status: AC | PRN
  Administered 2012-02-25: 100 mL via INTRAVENOUS

## 2012-02-25 MED ORDER — HEPARIN SODIUM (PORCINE) 5000 UNIT/ML IJ SOLN
5000.0000 [IU] | Freq: Three times a day (TID) | INTRAMUSCULAR | Status: DC
  Administered 2012-02-25 – 2012-02-26 (×4): 5000 [IU] via SUBCUTANEOUS
  Filled 2012-02-25 (×7): qty 1

## 2012-02-25 MED ORDER — HYDROCODONE-ACETAMINOPHEN 5-325 MG PO TABS: 1.0000 | ORAL_TABLET | Freq: Four times a day (QID) | ORAL | Status: DC | PRN

## 2012-02-25 MED ORDER — FUROSEMIDE 10 MG/ML IJ SOLN
40.0000 mg | Freq: Every day | INTRAMUSCULAR | Status: DC
  Filled 2012-02-25: qty 4

## 2012-02-25 MED ORDER — SODIUM CHLORIDE 0.9 % IJ SOLN
3.0000 mL | Freq: Two times a day (BID) | INTRAMUSCULAR | Status: DC
  Administered 2012-02-25 – 2012-02-26 (×3): 3 mL via INTRAVENOUS

## 2012-02-25 MED ORDER — POTASSIUM CHLORIDE CRYS ER 20 MEQ PO TBCR
40.0000 meq | EXTENDED_RELEASE_TABLET | Freq: Once | ORAL | Status: AC
  Administered 2012-02-25: 40 meq via ORAL
  Filled 2012-02-25: qty 2

## 2012-02-25 MED ORDER — OXYCODONE HCL 5 MG PO TABS: 5.0000 mg | ORAL_TABLET | ORAL | Status: DC | PRN

## 2012-02-25 MED ORDER — QUETIAPINE 12.5 MG HALF TABLET
12.5000 mg | ORAL_TABLET | Freq: Every day | ORAL | Status: DC
  Administered 2012-02-25: 12.5 mg via ORAL
  Filled 2012-02-25 (×2): qty 1

## 2012-02-25 MED ORDER — FUROSEMIDE 20 MG PO TABS
20.0000 mg | ORAL_TABLET | Freq: Every day | ORAL | Status: DC
  Administered 2012-02-25: 20 mg via ORAL
  Filled 2012-02-25: qty 1

## 2012-02-25 MED ORDER — METHOCARBAMOL 500 MG PO TABS
500.0000 mg | ORAL_TABLET | Freq: Two times a day (BID) | ORAL | Status: DC | PRN
Start: 2012-02-25 — End: 2012-02-26
  Filled 2012-02-25: qty 1

## 2012-02-25 MED ORDER — ALUM & MAG HYDROXIDE-SIMETH 200-200-20 MG/5ML PO SUSP: 30.0000 mL | Freq: Four times a day (QID) | ORAL | Status: DC | PRN

## 2012-02-25 MED ORDER — TRAMADOL HCL 50 MG PO TABS: 50.0000 mg | ORAL_TABLET | Freq: Four times a day (QID) | ORAL | Status: DC | PRN

## 2012-02-25 MED ORDER — VITAMIN D3 25 MCG (1000 UNIT) PO TABS
2000.0000 [IU] | ORAL_TABLET | Freq: Every day | ORAL | Status: DC
  Administered 2012-02-25 – 2012-02-26 (×2): 2000 [IU] via ORAL
  Filled 2012-02-25 (×2): qty 2

## 2012-02-25 MED ORDER — MIRTAZAPINE 15 MG PO TABS
15.0000 mg | ORAL_TABLET | Freq: Every day | ORAL | Status: DC
  Administered 2012-02-25: 15 mg via ORAL
  Filled 2012-02-25 (×2): qty 1

## 2012-02-25 NOTE — Progress Notes (Addendum)
Subjective: Feeling better today.  Still complaining of swelling in her legs but is improved.  Objective: Vital signs in last 24 hours: Filed Vitals:   02/24/12 2212 02/24/12 2227 02/25/12 0230 02/25/12 0602  BP: 124/56 124/56 168/85 140/81  Pulse: 64 63 68 57  Temp:   97.6 F (36.4 C) 97.7 F (36.5 C)  TempSrc:   Oral Oral  Resp: 13 18 17 15   Height:   5\' 4"  (1.626 m)   Weight:   85.8 kg (189 lb 2.5 oz)   SpO2: 95% 98% 96% 93%   Weight change:   Intake/Output Summary (Last 24 hours) at 02/25/12 0945 Last data filed at 02/25/12 0708  Gross per 24 hour  Intake      0 ml  Output    400 ml  Net   -400 ml    Physical Exam: General: Awake, Oriented, No acute distress. HEENT: EOMI. Neck: Supple, no prominent elevated JVD observed. CV: S1 and S2 Lungs: Clear to ascultation bilaterally Abdomen: Soft, Nontender, Nondistended, +bowel sounds. Ext: Good pulses. 1-2+ LE edema.  Lab Results: Basic Metabolic Panel:  Lab 02/25/12 4696 02/24/12 2032  NA 139 139  K 3.3* 4.4  CL 103 103  CO2 28 27  GLUCOSE 92 102*  BUN 10 14  CREATININE 0.65 0.84  CALCIUM 9.3 10.1  MG -- --  PHOS -- --   Liver Function Tests:  Lab 02/24/12 2032  AST 23  ALT 21  ALKPHOS 75  BILITOT 0.3  PROT 7.4  ALBUMIN 4.0   No results found for this basename: LIPASE:5,AMYLASE:5 in the last 168 hours No results found for this basename: AMMONIA:5 in the last 168 hours CBC:  Lab 02/25/12 0530 02/24/12 2032  WBC 4.8 4.8  NEUTROABS -- 2.2  HGB 14.2 15.0  HCT 41.8 43.6  MCV 89.1 89.2  PLT 156 170   Cardiac Enzymes:  Lab 02/25/12 0530  CKTOTAL --  CKMB --  CKMBINDEX --  TROPONINI <0.30   BNP (last 3 results)  Basename 02/24/12 2032 12/26/11 2344  PROBNP 109.2 64.9   CBG: No results found for this basename: GLUCAP:5 in the last 168 hours No results found for this basename: HGBA1C:5 in the last 72 hours Other Labs: No components found with this basename: POCBNP:3 No results found for  this basename: DDIMER:2 in the last 168 hours No results found for this basename: CHOL:2,HDL:2,LDLCALC:2,TRIG:2,CHOLHDL:2,LDLDIRECT:2 in the last 168 hours No results found for this basename: TSH,T4TOTAL,FREET3,T3FREE,FREET4,THYROIDAB in the last 168 hours No results found for this basename: VITAMINB12:2,FOLATE:2,FERRITIN:2,TIBC:2,IRON:2,RETICCTPCT:2 in the last 168 hours  Micro Results: No results found for this or any previous visit (from the past 240 hour(s)).  Studies/Results: Dg Chest 2 View  02/24/2012  *RADIOLOGY REPORT*  Clinical Data: Chest pain, dizziness, swelling and legs  CHEST - 2 VIEW  Comparison: 12/26/2011  Findings: Upper-normal size of cardiac silhouette. Tortuous aorta. Mediastinal contours and pulmonary vascularity normal. Minimal chronic bronchitic changes. No pulmonary infiltrate, pleural effusion, or pneumothorax. Bones demineralized.  IMPRESSION: Chronic bronchitic changes. No acute abnormalities.   Original Report Authenticated By: Lollie Marrow, M.D.    Ct Angio Chest Pe W/cm &/or Wo Cm  02/25/2012  *RADIOLOGY REPORT*  Clinical Data: Leg swelling, shortness of breath and chest pain. Question pulmonary embolism.  CT ANGIOGRAPHY CHEST  Technique:  Multidetector CT imaging of the chest using the standard protocol during bolus administration of intravenous contrast. Multiplanar reconstructed images including MIPs were obtained and reviewed to evaluate the vascular anatomy.  Contrast: OMNIPAQUE IOHEXOL 350 MG/ML SOLN  Comparison: Chest radiographs 02/24/2012.  Chest CTA 07/31/2008.  Findings: The pulmonary arteries are well opacified with contrast. There is no evidence of acute pulmonary embolism.  Mild aortic and great vessel atherosclerosis appears stable.  There is no evidence of aneurysm or dissection.  No enlarged mediastinal or hilar lymph nodes are seen.  There is no pleural or pericardial effusion.  There is streaky atelectasis at both lung bases.  No confluent airspace  opacity or endobronchial lesion is seen.  The visualized upper abdomen is stable.  There is a small calcified hepatic granuloma.  No acute osseous findings are seen.  IMPRESSION:  1.  No evidence of acute pulmonary embolism or other acute chest process. 2.  Scattered bibasilar atelectasis. 3.  Stable mild atherosclerosis.   Original Report Authenticated By: Gerrianne Scale, M.D.     Medications: I have reviewed the patient's current medications. Scheduled Meds:   . sodium chloride   Intravenous STAT  . aspirin EC  81 mg Oral Daily  . cholecalciferol  2,000 Units Oral Daily  . furosemide  40 mg Intravenous Daily  . heparin  5,000 Units Subcutaneous Q8H  . isosorbide mononitrate  15 mg Oral Daily  . mirtazapine  15 mg Oral QHS  . nitroGLYCERIN  0.5 inch Topical STAT  . pantoprazole  40 mg Oral Q1200  . QUEtiapine  12.5 mg Oral QHS  . simvastatin  20 mg Oral q1800  . sodium chloride  3 mL Intravenous Q12H  . cyanocobalamin  1,000 mcg Oral Daily  . DISCONTD: aspirin  81 mg Oral Daily  . DISCONTD: heparin  5,000 Units Subcutaneous Q8H  . DISCONTD: Vitamin D  2,000 Units Oral Daily   Continuous Infusions:  PRN Meds:.alum & mag hydroxide-simeth, HYDROcodone-acetaminophen, iohexol, methocarbamol, nitroGLYCERIN, ondansetron (ZOFRAN) IV, ondansetron, oxyCODONE, polyethylene glycol, traMADol  2-D echocardiogram on 02/25/2012 Study Conclusions - Left ventricle: The cavity size was normal. Wall thickness was increased in a pattern of mild LVH. Systolic function was normal. The estimated ejection fraction was in the range of 55% to 60%. Wall motion was normal; there were no regional wall motion abnormalities. Doppler parameters are consistent with abnormal left ventricular relaxation (grade 1 diastolic dysfunction). - Aortic valve: Trivial regurgitation. - Mitral valve: Mild regurgitation.  Assessment/Plan: Lower extremity edema, possible underlying chronic diastolic heart failure Etiology  unclear.  Patient not complaining of any chest pain or shortness of breath, patient does not appear to be in volume overload on exam.  CT angiogram of the chest on 02/25/2012 was negative for pulmonary embolism.  Bilateral lower extremity Dopplers negative for DVT on 02/25/2012.  2-D echocardiogram done with results as indicated above.  Patient started on furosemide for diuresis, continue to monitor electrolytes and creatinine.  Had an extensive conversation with patient and patient's daughter, both favored conservative management but wanted 2-D echocardiogram done.  Elevate legs while in bed.  Chronic intermittent episodes of chest pain Cardiac enzymes negative so far.  2-D echocardiogram results as indicated above.  Patient has an appointment with Dr. Myrtis Ser in the September.  No events on telemetry.  Prior notes from Dr. Myrtis Ser, cardiology reviewed.  Continue isosorbide mononitrate.  Hyperlipidemia Continue statin.  Coronary artery disease Continue aspirin.  Hypertension Stable.  Continue isosorbide mononitrate.  Depression Continue quetiapine and mirtazapine.  Dementia Stable at this time.  Prophylaxis Subcutaneous heparin.  Disposition Consider discharge in 24 hours, if no further episodes of chest pain or no events on  telemetry.  Patient ambulating with minimal assist, likely will go back to assisted living facility at discharge.   LOS: 1 day  Ryane Canavan A, MD 02/25/2012, 9:45 AM

## 2012-02-25 NOTE — ED Notes (Signed)
Patient found OOB in bathroom. Instructed patient to call staff for assistance.

## 2012-02-25 NOTE — H&P (Signed)
[facility]                                                                                    Patient Demographics  Valerie Chen, is a 78 y.o. female  CSN: [id number]  MRN: [id number]  DOB - 09-16-1928  Admit Date - 02/24/2012  Outpatient Primary MD for the patient is Marletta Lor, NP   With History of -  Past Medical History  Diagnosis Date  . Chest pain     Hospital February, 2012, nuclear, possible anteroseptal and inferoseptal ischemia, technically difficult, medical therapy  . Dyslipidemia   . Orthostatic hypotension   . Migraines     25 year  . Dementia     Significant  . Ejection fraction     EF 55%, echo, February, 2012  . Diastolic dysfunction     Echo, February, 2012  . TIA (transient ischemic attack)        ???question of a TIA in February, 2012, carotid Doppler, August 11, 2010 no significant abnormalities  . GI bleed     February, 2012 incomplete colonoscopy, hemorrhoids, needs complete colonoscopy  . CAD (coronary artery disease)     Probable CAD, nuclear February, 2012, possible anteroseptal and inferoseptal ischemia but the study was technically difficult  . Hyperlipidemia       Past Surgical History  Procedure Date  . Abdominal hysterectomy   . Diagnostic mammogram 12/29/2000    Mammogram, suspicious lesions -BX pending    in for   Chief Complaint  Patient presents with  . Chest Pain     HPI  Valerie Chen  is a 78 y.o. female, with significant past medical history of hypertension, dyslipidemia, coronary artery disease, last stress test done by Dr. Myrtis Ser, in February 2012 showing ejection fraction of 55%, and a small reversible defect in the apical anterior segment and partially reversible in the basal inferior septal, patient didn't assisted living facility resident for the last few weeks, patient care per presents mainly to complaints of worsening lower extremity edema, where she reports she noticed her lower extremity  swelling has been progressively worsening over the last week, and upon questioning about her head chest pain as air reason for admission she reports that are the main cause, she reports she's been having chest pain on and off for the last care every month, and where she had recent hospitalization in July of this year for similar complaints of chest pain, where she has negative cardiac enzymes, as well presented complaining of mild shortness of breath, he even though she did she doesn't appear to be in respiratory distress and was not hypoxic, patient was given 324 mg of aspirin in ED, patient proBNP was within normal limits, by reviewing patient's records last echo was done in February of last year was showing ejection fraction of 63% with abnormal left ventricular diastolic filling consistent with impaired relaxation.    Review of Systems    In addition to the HPI above, No Fever-chills, No Headache, No changes with Vision or hearing, No problems swallowing food or Liquids, Complaining of recurrent chest pain over the last couple months , denies any cough Cough  complains of mild Shortness of Breath, No Abdominal pain, No Nausea or Vommitting, Bowel movements are regular, No Blood in stool or Urine, No dysuria, No new skin rashes or bruises, No new joints pains-aches,  No new weakness, tingling, numbness in any extremity, complaints of worsening lower extremity edema No recent weight gain or loss, No polyuria, polydypsia or polyphagia, No significant Mental Stressors.  A full 10 point Review of Systems was done, except as stated above, all other Review of Systems were negative.   Social History History  Substance Use Topics  . Smoking status: Never Smoker   . Smokeless tobacco: Never Used  . Alcohol Use: No     Family History Family History  Problem Relation Age of Onset  . Heart attack Father     several heart attacks  . Hypertension Daughter      Prior to Admission  medications   Medication Sig Start Date End Date Taking? Authorizing Provider  amLODipine (NORVASC) 10 MG tablet Take 1 tablet (10 mg total) by mouth daily. 01/27/12 01/26/13  Beatrice Lecher, PA  aspirin (ASPIR-LOW) 81 MG EC tablet Take 81 mg by mouth daily.      Historical Provider, MD  Cholecalciferol (VITAMIN D) 2000 UNITS tablet Take 2,000 Units by mouth daily.    Historical Provider, MD  cyanocobalamin ([employer] VITAMIN B12) 1000 MCG tablet Take 1,000 mcg by mouth daily.     Historical Provider, MD  HYDROcodone-acetaminophen (NORCO/VICODIN) 5-325 MG per tablet Take 1 tablet by mouth every 6 (six) hours as needed.    Historical Provider, MD  hyoscyamine (ANASPAZ) 0.125 MG TBDP Place under the tongue every 6 (six) hours as needed.    Historical Provider, MD  isosorbide mononitrate (IMDUR) 60 MG 24 hr tablet TAKE 60 MG IN THE MORNING AND TAKE 30 MG IN THE EVENING; MAKE SURE TO TAKE THESE 12 HOURS APART 01/12/12   Beatrice Lecher, PA  methocarbamol (ROBAXIN) 500 MG tablet Take 500 mg by mouth every 12 (twelve) hours as needed. Only for 7 days.    Historical Provider, MD  mirtazapine (REMERON) 15 MG tablet Take 1 tablet by mouth at bedtime. 11/02/10   Historical Provider, MD  nitroGLYCERIN (NITROSTAT) 0.4 MG SL tablet Place 0.4 mg under the tongue every 5 (five) minutes as needed. May repeat up to 3 times.  If after 3rd dose chest pain persists, call 911 and proceed to ER.     Historical Provider, MD  pantoprazole (PROTONIX) 40 MG tablet MAKE SURE TO TAKE 30 MINUTES BEFORE YOUR BREAKFAST 01/12/12   Beatrice Lecher, PA  pravastatin (PRAVACHOL) 20 MG tablet Take 20 mg by mouth daily.      Historical Provider, MD  promethazine (PHENERGAN) 12.5 MG tablet Take 12.5 mg by mouth every 6 (six) hours as needed. For nausea    Historical Provider, MD  QUEtiapine (SEROQUEL) 25 MG tablet Take 12.5 mg by mouth at bedtime.      Historical Provider, MD  traMADol (ULTRAM) 50 MG tablet Take 50 mg by mouth 3 (three) times daily as  needed. pain    Historical Provider, MD    No Known Allergies  Physical Exam  Vitals  Blood pressure 124/56, pulse 63, temperature 98.8 F (37.1 C), temperature source Oral, resp. rate 18, SpO2 98.00%.   1. General well-nourished female lying in bed in NAD,   2. Normal affect and insight, Not Suicidal or Homicidal, Awake Alert, Oriented X 3.  3. No F.N deficits, ALL C.Nerves Intact,  Strength 5/5 all 4 extremities, Sensation intact all 4 extremities, Plantars down going.  4. Ears and Eyes appear Normal, Conjunctivae clear, PERRLA. Moist Oral Mucosa.  5. Supple Neck, No JVD, No cervical lymphadenopathy appriciated, No Carotid Bruits.  6. Symmetrical Chest wall movement, Good air movement bilaterally, CTAB.  7. RRR, No Gallops, Rubs or Murmurs, No Parasternal Heave. Bilateral lower extremity edema +3  8. Positive Bowel Sounds, Abdomen Soft, Non tender, No organomegaly appriciated,No rebound -guarding or rigidity.  9.  No Cyanosis, Normal Skin Turgor, No Skin Rash or Bruise.  10. Good muscle tone,  joints appear normal , no effusions, Normal ROM.  11. No Palpable Lymph Nodes in Neck or Axillae    Data Review  CBC  Lab 02/24/12 2032  WBC 4.8  HGB 15.0  HCT 43.6  PLT 170  MCV 89.2  MCH 30.7  MCHC 34.4  RDW 12.8  LYMPHSABS 2.2  MONOABS 0.4  EOSABS 0.0  BASOSABS 0.0  BANDABS --   ------------------------------------------------------------------------------------------------------------------  Chemistries   Lab 02/24/12 2032  NA 139  K 4.4  CL 103  CO2 27  GLUCOSE 102*  BUN 14  CREATININE 0.84  CALCIUM 10.1  MG --  AST 23  ALT 21  ALKPHOS 75  BILITOT 0.3   ------------------------------------------------------------------------------------------------------------------ CrCl is unknown because both a height and weight (above a minimum accepted value) are required for this  calculation. ------------------------------------------------------------------------------------------------------------------ No results found for this basename: TSH,T4TOTAL,FREET3,T3FREE,THYROIDAB in the last 72 hours   Coagulation profile No results found for this basename: INR:5,PROTIME:5 in the last 168 hours ------------------------------------------------------------------------------------------------------------------- No results found for this basename: DDIMER:2 in the last 72 hours -------------------------------------------------------------------------------------------------------------------  Cardiac Enzymes No results found for this basename: CK:3,CKMB:3,TROPONINI:3,MYOGLOBIN:3 in the last 168 hours ------------------------------------------------------------------------------------------------------------------ No components found with this basename: POCBNP:3   ---------------------------------------------------------------------------------------------------------------  Urinalysis    Component Value Date/Time   COLORURINE YELLOW 02/24/2012 2213   APPEARANCEUR CLEAR 02/24/2012 2213   LABSPEC 1.011 02/24/2012 2213   PHURINE 6.5 02/24/2012 2213   GLUCOSEU NEGATIVE 02/24/2012 2213   HGBUR NEGATIVE 02/24/2012 2213   BILIRUBINUR NEGATIVE 02/24/2012 2213   KETONESUR NEGATIVE 02/24/2012 2213   PROTEINUR NEGATIVE 02/24/2012 2213   UROBILINOGEN 0.2 02/24/2012 2213   NITRITE NEGATIVE 02/24/2012 2213   LEUKOCYTESUR NEGATIVE 02/24/2012 2213    ----------------------------------------------------------------------------------------------------------------    Imaging results:   Dg Chest 2 View  02/24/2012  *RADIOLOGY REPORT*  Clinical Data: Chest pain, dizziness, swelling and legs  CHEST - 2 VIEW  Comparison: 12/26/2011  Findings: Upper-normal size of cardiac silhouette. Tortuous aorta. Mediastinal contours and pulmonary vascularity normal. Minimal chronic bronchitic changes. No pulmonary  infiltrate, pleural effusion, or pneumothorax. Bones demineralized.  IMPRESSION: Chronic bronchitic changes. No acute abnormalities.   Original Report Authenticated By: Lollie Marrow, M.D.    EKG showing ventricular rate at 63, normal sinus rhythm, without significant ST or T wave changes, multiple PACs Last echo Report    Assessment & Plan  Principal Problem:  *Lower extremity edema Active Problems:  Chest pain  Dyslipidemia  CAD (coronary artery disease)    1. lower extremity edema, as well patient has complaints of chest pain and shortness of breath, this raises the possibility of pulmonary embolism, so we'll obtain CT chest angiogram to rule out PE, as well we'll obtain bilateral lower extremity venous Dopplers, patient BNP within normal limits, it's unclear if she is in acute CHF or not, will repeat a 2-D echo in a.m., meanwhile will will start patient on IV Lasix.  2.  Chest pain: As well etiology is unclear, air initially will obtain CT angiogram chest to rule out PE, will obtain 3 sets of cardiac enzymes, rule out any acute coronary syndrome, patient is is known to have history of coronary artery disease, already on aspirin statin, will add low-dose beta blocker [facility] 2-D echo does not appear to show acute CHF.  3. Dyslipidemia, continue with statin  4 coronary artery disease, continue with aspirin statin we'll add beta blocker if patient is not in acute CHF,   DVT Prophylaxis Heparin  AM Labs Ordered, also please review Full Orders  Family Communication: Admission, patients condition and plan of care including tests being ordered have been discussed with the patient  who indicate understanding and agree with the plan and Code Status.  Code Status DNR/DNI, patient has yellow DNR/DNI form from assisted living facility, as well she confirmed that CODE STATUS  Disposition Plan: [facility] facility  Time spent in minutes : 55 MIN  Condition GUARDED  Kiondre Grenz,  Kenzy Campoverde M.D on 02/25/2012 at 12:52 AM   [facility] Office  [phone]

## 2012-02-25 NOTE — Progress Notes (Signed)
Utilization review completed.  

## 2012-02-25 NOTE — ED Notes (Signed)
Patient removed equipment.  States she is leaving tomorrow and doesn't need any of this stuff. Patient reoriented to need for monitoring. Will continue to monitor patient

## 2012-02-25 NOTE — Clinical Social Work Psychosocial (Addendum)
    Clinical Social Work Department BRIEF PSYCHOSOCIAL ASSESSMENT 02/25/2012  Patient:  Valerie Chen, Valerie Chen     Account Number:  [id number]     Admit date:  02/24/2012  Clinical Social Worker:  Doree Albee  Date/Time:  02/25/2012 01:23 PM  Referred by:  RN  Date Referred:  02/25/2012 Referred for  ALF Placement   Other Referral:   Interview type:  Family Other interview type:    PSYCHOSOCIAL DATA Living Status:  FACILITY Admitted from facility:  [facility] Level of care:  Assisted Living Primary support name:  Valerie Chen Primary support relationship to patient:  CHILD, ADULT Degree of support available:   strong    CURRENT CONCERNS Current Concerns  Post-Acute Placement   Other Concerns:    SOCIAL WORK ASSESSMENT / PLAN CSW spoke with pt daughter by phone as pt is only oriented to self at this time. CSW and pt daughter discussed pt current living environment and pt discharge plans. Pt daughter shared that pt is a long term resident at [facility]. Pt daughter shared that she hopes for patient to return to [facility] when medically stable.      CSW confirmed with facility that patient can return when medically stable. Per discussion with facility, pt is fairly independent recieving assistance with medications only. Per discussion with ALF, pt is able to recieve more hands on care if needed. ALF shared that patient has a little dog that she tends to.   Assessment/plan status:  Psychosocial Support/Ongoing Assessment of Needs Other assessment/ plan:   Information/referral to community resources:   no resources idenfited at this time, pt plans to return to [facility] and is connected within the [facility] community.    PATIENT'S/FAMILY'S RESPONSE TO PLAN OF CARE: Pt daughter and facility thanked csw for concern and support. Both pt duaghter and alf are motivated for pt to return to [facility] when medically stable.   Pt is UNABLE TO readmit  to ALF over the weekend.

## 2012-02-25 NOTE — Progress Notes (Signed)
Bilateral:  No evidence of DVT, superficial thrombosis, or Baker's Cyst.   

## 2012-02-25 NOTE — Progress Notes (Signed)
  Echocardiogram 2D Echocardiogram has been performed.  [name] 02/25/2012, 2:42 PM

## 2012-02-26 DIAGNOSIS — I519 Heart disease, unspecified: Secondary | ICD-10-CM

## 2012-02-26 DIAGNOSIS — F0390 Unspecified dementia without behavioral disturbance: Secondary | ICD-10-CM

## 2012-02-26 LAB — CBC
HCT: 44.3 % (ref 36.0–46.0)
MCV: 88.4 fL (ref 78.0–100.0)
Platelets: 175 10*3/uL (ref 150–400)
RBC: 5.01 MIL/uL (ref 3.87–5.11)
RDW: 12.8 % (ref 11.5–15.5)
WBC: 4.2 10*3/uL (ref 4.0–10.5)

## 2012-02-26 LAB — BASIC METABOLIC PANEL
CO2: 25 mEq/L (ref 19–32)
Chloride: 105 mEq/L (ref 96–112)
Creatinine, Ser: 0.68 mg/dL (ref 0.50–1.10)
GFR calc Af Amer: >90 mL/min (ref >90)
Potassium: 3.9 mEq/L (ref 3.5–5.1)

## 2012-02-26 LAB — MAGNESIUM: Magnesium: 2.2 mg/dL (ref 1.5–2.5)

## 2012-02-26 MED ORDER — FUROSEMIDE 20 MG PO TABS: 20.0000 mg | ORAL_TABLET | Freq: Every day | ORAL | Status: DC

## 2012-02-26 MED ORDER — POTASSIUM CHLORIDE CRYS ER 20 MEQ PO TBCR: 20.0000 meq | EXTENDED_RELEASE_TABLET | Freq: Every day | ORAL | Status: DC

## 2012-02-26 NOTE — Discharge Summary (Signed)
Physician Discharge Summary  Valerie Chen ZOX:[id number] DOB: 1928/10/12 DOA: 02/24/2012  PCP: Marletta Lor, NP  Admit date: 02/24/2012 Discharge date: 02/26/2012  Recommendations for Outpatient Follow-up:  Followup with Marletta Lor, NP (PCP) in 1 week. Followup with Dr. Myrtis Ser as already scheduled at the end of the month. Please have BMET (electrolytes) checked on 03/01/2012 and have PCP followup on the results.  Discharge Diagnoses:  Principal Problem:  *Lower extremity edema Active Problems:  Chest pain  Dyslipidemia  CAD (coronary artery disease)  Discharge Condition: Stable  Diet recommendation: Heart healthy diet  Filed Weights   02/25/12 0230  Weight: 85.8 kg (189 lb 2.5 oz)    History of present illness:  78 y.o. female, with significant past medical history of hypertension, dyslipidemia, coronary artery disease, last stress test done by Dr. Myrtis Ser, in February 2012 showing ejection fraction of 55%, and a small reversible defect in the apical anterior segment and partially reversible in the basal inferior septal managed conservatively, who presented with chest pain and lower extremity swelling on 02/25/2012.  Hospital Course:  Lower extremity edema, possible underlying chronic diastolic heart failure  Etiology unclear, may be due to possible diastolic heart failure. CT angiogram of the chest on 02/25/2012 was negative for pulmonary embolism. Bilateral lower extremity Dopplers negative for DVT on 02/25/2012. 2-D echocardiogram done with results as indicated above. Continue furosemide for diuresis, continue to monitor electrolytes and creatinine, will need labs to be checked on 03/01/2012. Elevate legs while in bed. Patient and family indicated that they will followup with Dr. Myrtis Ser with cardiology, already has an appointment at the end of the week.   Chronic intermittent episodes of chest pain  Cardiac enzymes negative so far. 2-D echocardiogram results as indicated above. Patient has an  appointment with Dr. Myrtis Ser in the September. No events on telemetry. Prior notes from Dr. Myrtis Ser, cardiology reviewed. Continue isosorbide mononitrate.   Hyperlipidemia  Continue statin.   Coronary artery disease  Continue aspirin.   Hypertension  Stable. Continue isosorbide mononitrate.   Depression  Continue quetiapine and mirtazapine.   Dementia  Stable at this time.  Procedures:  As above  Consultations:  None  Discharge Exam: Filed Vitals:   02/25/12 2103 02/26/12 0608 02/26/12 0800 02/26/12 0955  BP: 140/79 148/84 146/81 132/74  Pulse: 72 66 65   Temp: 98.4 F (36.9 C) 98.9 F (37.2 C)    TempSrc: Oral Oral    Resp: 16 15    Height:      Weight:      SpO2: 95% 94%      Discharge Instructions  Discharge Orders    Future Appointments: Provider: Department: Dept Phone: Center:   03/20/2012 4:15 PM Luis Abed, MD [facility] [phone] [id number]     Future Orders Please Complete By Expires   Diet - low sodium heart healthy      Increase activity slowly      Discharge instructions      Comments:   Followup with Marletta Lor, NP (PCP) in 1 week. Followup with Dr. Myrtis Ser as already scheduled at the end of the month. Please have BMET (electrolytes) checked on 03/01/2012 and have PCP followup on the results.     Medication List  As of 02/26/2012 11:05 AM   TAKE these medications         amLODipine 5 MG tablet   Commonly known as: NORVASC   Take 5 mg by mouth daily.      ASPIR-LOW 81 MG  EC tablet   Generic drug: aspirin   Take 81 mg by mouth daily.      [employer] VITAMIN B12 1000 MCG tablet   Generic drug: cyanocobalamin   Take 1,000 mcg by mouth daily.      furosemide 20 MG tablet   Commonly known as: LASIX   Take 1 tablet (20 mg total) by mouth daily.      HYDROcodone-acetaminophen 5-325 MG per tablet   Commonly known as: NORCO/VICODIN   Take 1 tablet by mouth every 6 (six) hours as needed. For severe pain      hyoscyamine 0.125 MG tablet    Commonly known as: LEVSIN, ANASPAZ   Take 0.125 mg by mouth every 6 (six) hours as needed. For abdominal pain      isosorbide mononitrate 60 MG 24 hr tablet   Commonly known as: IMDUR   Take 30-60 mg by mouth daily. Take one tablet (60 mg) every day in the morning and 1/2 tablet (30 mg) every day in the evening      meclizine 12.5 MG tablet   Commonly known as: ANTIVERT   Take 25 mg by mouth 2 (two) times daily as needed. For dizziness      mirtazapine 15 MG tablet   Commonly known as: REMERON   Take 15 mg by mouth daily.      nitroGLYCERIN 0.4 MG SL tablet   Commonly known as: NITROSTAT   Place 0.4 mg under the tongue every 5 (five) minutes as needed. For chest pain  - up to 3 doses - if no relief call 911      pantoprazole 40 MG tablet   Commonly known as: PROTONIX   Take 40 mg by mouth daily before breakfast. Take 30 minutes before breakfast      potassium chloride SA 20 MEQ tablet   Commonly known as: K-DUR,KLOR-CON   Take 1 tablet (20 mEq total) by mouth daily. With lasix.      pravastatin 10 MG tablet   Commonly known as: PRAVACHOL   Take 10 mg by mouth at bedtime.      promethazine 12.5 MG tablet   Commonly known as: PHENERGAN   Take 12.5 mg by mouth every 6 (six) hours as needed. For nausea      QUEtiapine 25 MG tablet   Commonly known as: SEROQUEL   Take 12.5 mg by mouth at bedtime.      traMADol 50 MG tablet   Commonly known as: ULTRAM   Take 100 mg by mouth every 8 (eight) hours as needed. For mild to moderate pain      Vitamin D 2000 UNITS tablet   Take 2,000 Units by mouth daily.              The results of significant diagnostics from this hospitalization (including imaging, microbiology, ancillary and laboratory) are listed below for reference.    Significant Diagnostic Studies: Dg Chest 2 View  02/24/2012  *RADIOLOGY REPORT*  Clinical Data: Chest pain, dizziness, swelling and legs  CHEST - 2 VIEW  Comparison: 12/26/2011  Findings:  Upper-normal size of cardiac silhouette. Tortuous aorta. Mediastinal contours and pulmonary vascularity normal. Minimal chronic bronchitic changes. No pulmonary infiltrate, pleural effusion, or pneumothorax. Bones demineralized.  IMPRESSION: Chronic bronchitic changes. No acute abnormalities.   Original Report Authenticated By: Lollie Marrow, M.D.    Ct Angio Chest Pe W/cm &/or Wo Cm  02/25/2012  *RADIOLOGY REPORT*  Clinical Data: Leg swelling, shortness of breath and chest  pain. Question pulmonary embolism.  CT ANGIOGRAPHY CHEST  Technique:  Multidetector CT imaging of the chest using the standard protocol during bolus administration of intravenous contrast. Multiplanar reconstructed images including MIPs were obtained and reviewed to evaluate the vascular anatomy.  Contrast: OMNIPAQUE IOHEXOL 350 MG/ML SOLN  Comparison: Chest radiographs 02/24/2012.  Chest CTA 07/31/2008.  Findings: The pulmonary arteries are well opacified with contrast. There is no evidence of acute pulmonary embolism.  Mild aortic and great vessel atherosclerosis appears stable.  There is no evidence of aneurysm or dissection.  No enlarged mediastinal or hilar lymph nodes are seen.  There is no pleural or pericardial effusion.  There is streaky atelectasis at both lung bases.  No confluent airspace opacity or endobronchial lesion is seen.  The visualized upper abdomen is stable.  There is a small calcified hepatic granuloma.  No acute osseous findings are seen.  IMPRESSION:  1.  No evidence of acute pulmonary embolism or other acute chest process. 2.  Scattered bibasilar atelectasis. 3.  Stable mild atherosclerosis.   Original Report Authenticated By: Gerrianne Scale, M.D.    2-D echocardiogram on 02/25/2012  Study Conclusions - Left ventricle: The cavity size was normal. Wall thickness was increased in a pattern of mild LVH. Systolic function was normal. The estimated ejection fraction was in the range of 55% to 60%. Wall motion  was normal; there were no regional wall motion abnormalities. Doppler parameters are consistent with abnormal left ventricular relaxation (grade 1 diastolic dysfunction). - Aortic valve: Trivial regurgitation. - Mitral valve: Mild regurgitation.  Lower Extremity Dopplers on 02/25/2012 Summary: - No evidence of deep vein or superficial thrombosis involving the right lower extremity and left lower extremity. - No evidence of Baker's cyst on the right or left.  Microbiology: No results found for this or any previous visit (from the past 240 hour(s)).   Labs: Basic Metabolic Panel:  Lab 02/26/12 1610 02/25/12 0530 02/24/12 2032  NA 140 139 139  K 3.9 3.3* 4.4  CL 105 103 103  CO2 25 28 27   GLUCOSE 98 92 102*  BUN 15 10 14   CREATININE 0.68 0.65 0.84  CALCIUM 9.6 9.3 10.1  MG 2.2 -- --  PHOS -- -- --   Liver Function Tests:  Lab 02/24/12 2032  AST 23  ALT 21  ALKPHOS 75  BILITOT 0.3  PROT 7.4  ALBUMIN 4.0   No results found for this basename: LIPASE:5,AMYLASE:5 in the last 168 hours No results found for this basename: AMMONIA:5 in the last 168 hours CBC:  Lab 02/26/12 0600 02/25/12 0530 02/24/12 2032  WBC 4.2 4.8 4.8  NEUTROABS -- -- 2.2  HGB 15.1* 14.2 15.0  HCT 44.3 41.8 43.6  MCV 88.4 89.1 89.2  PLT 175 156 170   Cardiac Enzymes:  Lab 02/25/12 1140 02/25/12 0530  CKTOTAL -- --  CKMB -- --  CKMBINDEX -- --  TROPONINI <0.30 <0.30   BNP: BNP (last 3 results)  Basename 02/24/12 2032 12/26/11 2344  PROBNP 109.2 64.9   CBG: No results found for this basename: GLUCAP:5 in the last 168 hours  Time coordinating discharge: 25 minutes  Signed:  Audreana Hancox A  [facility] 02/26/2012, 11:05 AM

## 2012-02-26 NOTE — Progress Notes (Addendum)
1105 CSW consulted by RN ZO:[id number] being ready for d/c. CSW stated the patient would be unable to d/c until Monday per CSW note 9/6. This CSW called to confirm this with Ginger ([phone]) at [facility]. Weekday CSW will follow up for d/c.  1155 ADDENDUM: RN contacted CSW stating patient was cleared to return to [facility]. CSW contacted [employer] (info provided by RN)  at [phone] about discharge to ALF. Ms. Stevie Kern stated patient could return today if FL2 was updated, d/c summary and AVS was brought with the patient. CSW updated FL2 and provided the requested information to the RN in the [facility].  Patient's daughter will transport the patient to [facility]. CSW signing off, no other psychosocial concerns identified. Please re-consult as needed.   Lia Foyer, LCSWA [facility] Clinical Social Worker Contact #: [phone] (weekend)

## 2012-02-26 NOTE — Progress Notes (Signed)
Subjective: Leg swelling improved. Denies any chest pain or shortness of breath.  Objective: Vital signs in last 24 hours: Filed Vitals:   02/25/12 2103 02/26/12 0608 02/26/12 0800 02/26/12 0955  BP: 140/79 148/84 146/81 132/74  Pulse: 72 66 65   Temp: 98.4 F (36.9 C) 98.9 F (37.2 C)    TempSrc: Oral Oral    Resp: 16 15    Height:      Weight:      SpO2: 95% 94%     Weight change:   Intake/Output Summary (Last 24 hours) at 02/26/12 1057 Last data filed at 02/26/12 1007  Gross per 24 hour  Intake      3 ml  Output      0 ml  Net      3 ml    Physical Exam: General: Awake, Oriented, No acute distress. HEENT: EOMI. Neck: Supple, no prominent elevated JVD observed. CV: S1 and S2 Lungs: Clear to ascultation bilaterally Abdomen: Soft, Nontender, Nondistended, +bowel sounds. Ext: Good pulses. 1+ LE edema, improved for yesterday.  Lab Results: Basic Metabolic Panel:  Lab 02/26/12 1610 02/25/12 0530 02/24/12 2032  NA 140 139 139  K 3.9 3.3* 4.4  CL 105 103 103  CO2 25 28 27   GLUCOSE 98 92 102*  BUN 15 10 14   CREATININE 0.68 0.65 0.84  CALCIUM 9.6 9.3 10.1  MG 2.2 -- --  PHOS -- -- --   Liver Function Tests:  Lab 02/24/12 2032  AST 23  ALT 21  ALKPHOS 75  BILITOT 0.3  PROT 7.4  ALBUMIN 4.0   No results found for this basename: LIPASE:5,AMYLASE:5 in the last 168 hours No results found for this basename: AMMONIA:5 in the last 168 hours CBC:  Lab 02/26/12 0600 02/25/12 0530 02/24/12 2032  WBC 4.2 4.8 4.8  NEUTROABS -- -- 2.2  HGB 15.1* 14.2 15.0  HCT 44.3 41.8 43.6  MCV 88.4 89.1 89.2  PLT 175 156 170   Cardiac Enzymes:  Lab 02/25/12 1140 02/25/12 0530  CKTOTAL -- --  CKMB -- --  CKMBINDEX -- --  TROPONINI <0.30 <0.30   BNP (last 3 results)  Basename 02/24/12 2032 12/26/11 2344  PROBNP 109.2 64.9   CBG: No results found for this basename: GLUCAP:5 in the last 168 hours No results found for this basename: HGBA1C:5 in the last 72  hours Other Labs: No components found with this basename: POCBNP:3 No results found for this basename: DDIMER:2 in the last 168 hours No results found for this basename: CHOL:2,HDL:2,LDLCALC:2,TRIG:2,CHOLHDL:2,LDLDIRECT:2 in the last 168 hours No results found for this basename: TSH,T4TOTAL,FREET3,T3FREE,FREET4,THYROIDAB in the last 168 hours No results found for this basename: VITAMINB12:2,FOLATE:2,FERRITIN:2,TIBC:2,IRON:2,RETICCTPCT:2 in the last 168 hours  Micro Results: No results found for this or any previous visit (from the past 240 hour(s)).  Studies/Results: Dg Chest 2 View  02/24/2012  *RADIOLOGY REPORT*  Clinical Data: Chest pain, dizziness, swelling and legs  CHEST - 2 VIEW  Comparison: 12/26/2011  Findings: Upper-normal size of cardiac silhouette. Tortuous aorta. Mediastinal contours and pulmonary vascularity normal. Minimal chronic bronchitic changes. No pulmonary infiltrate, pleural effusion, or pneumothorax. Bones demineralized.  IMPRESSION: Chronic bronchitic changes. No acute abnormalities.   Original Report Authenticated By: Lollie Marrow, M.D.    Ct Angio Chest Pe W/cm &/or Wo Cm  02/25/2012  *RADIOLOGY REPORT*  Clinical Data: Leg swelling, shortness of breath and chest pain. Question pulmonary embolism.  CT ANGIOGRAPHY CHEST  Technique:  Multidetector CT imaging of the chest using  the standard protocol during bolus administration of intravenous contrast. Multiplanar reconstructed images including MIPs were obtained and reviewed to evaluate the vascular anatomy.  Contrast: OMNIPAQUE IOHEXOL 350 MG/ML SOLN  Comparison: Chest radiographs 02/24/2012.  Chest CTA 07/31/2008.  Findings: The pulmonary arteries are well opacified with contrast. There is no evidence of acute pulmonary embolism.  Mild aortic and great vessel atherosclerosis appears stable.  There is no evidence of aneurysm or dissection.  No enlarged mediastinal or hilar lymph nodes are seen.  There is no pleural or  pericardial effusion.  There is streaky atelectasis at both lung bases.  No confluent airspace opacity or endobronchial lesion is seen.  The visualized upper abdomen is stable.  There is a small calcified hepatic granuloma.  No acute osseous findings are seen.  IMPRESSION:  1.  No evidence of acute pulmonary embolism or other acute chest process. 2.  Scattered bibasilar atelectasis. 3.  Stable mild atherosclerosis.   Original Report Authenticated By: Gerrianne Scale, M.D.     Medications: I have reviewed the patient's current medications. Scheduled Meds:    . sodium chloride   Intravenous STAT  . aspirin EC  81 mg Oral Daily  . cholecalciferol  2,000 Units Oral Daily  . furosemide  20 mg Oral BID  . heparin  5,000 Units Subcutaneous Q8H  . isosorbide mononitrate  15 mg Oral Daily  . mirtazapine  15 mg Oral QHS  . pantoprazole  40 mg Oral Q1200  . potassium chloride  20 mEq Oral BID  . potassium chloride  40 mEq Oral Once  . QUEtiapine  12.5 mg Oral QHS  . simvastatin  20 mg Oral q1800  . sodium chloride  3 mL Intravenous Q12H  . cyanocobalamin  1,000 mcg Oral Daily  . DISCONTD: furosemide  40 mg Intravenous Daily  . DISCONTD: furosemide  20 mg Oral Daily   Continuous Infusions:  PRN Meds:.alum & mag hydroxide-simeth, HYDROcodone-acetaminophen, methocarbamol, nitroGLYCERIN, ondansetron (ZOFRAN) IV, ondansetron, oxyCODONE, polyethylene glycol, traMADol  2-D echocardiogram on 02/25/2012 Study Conclusions - Left ventricle: The cavity size was normal. Wall thickness was increased in a pattern of mild LVH. Systolic function was normal. The estimated ejection fraction was in the range of 55% to 60%. Wall motion was normal; there were no regional wall motion abnormalities. Doppler parameters are consistent with abnormal left ventricular relaxation (grade 1 diastolic dysfunction). - Aortic valve: Trivial regurgitation. - Mitral valve: Mild regurgitation.  Assessment/Plan: Lower extremity  edema, possible underlying chronic diastolic heart failure Etiology unclear, may be due to possible diastolic heart failure. CT angiogram of the chest on 02/25/2012 was negative for pulmonary embolism.  Bilateral lower extremity Dopplers negative for DVT on 02/25/2012.  2-D echocardiogram done with results as indicated above.  Continue furosemide for diuresis, continue to monitor electrolytes and creatinine, will need labs to be checked on 03/01/2012.  Elevate legs while in bed.  Patient and family indicated that they will followup with Dr. Myrtis Ser with cardiology, already has an appointment at the end of the week.   Chronic intermittent episodes of chest pain Cardiac enzymes negative so far.  2-D echocardiogram results as indicated above.  Patient has an appointment with Dr. Myrtis Ser in the September.  No events on telemetry.  Prior notes from Dr. Myrtis Ser, cardiology reviewed.  Continue isosorbide mononitrate.  Hyperlipidemia Continue statin.  Coronary artery disease Continue aspirin.  Hypertension Stable.  Continue isosorbide mononitrate.  Depression Continue quetiapine and mirtazapine.  Dementia Stable at this time.  Prophylaxis  Subcutaneous heparin.  Disposition Discharge the patient with assisted living facility. Patient ambulating with minimal assist.   LOS: 2 days  Minah Axelrod A, MD 02/26/2012, 10:57 AM

## 2012-03-08 ENCOUNTER — Telehealth: Payer: Self-pay | Admitting: Cardiology

## 2012-03-08 NOTE — Telephone Encounter (Signed)
New Problem:    Called in needing a 30 day refill of the patient's isosorbide mononitrate (IMDUR) 60 MG 24 hr tablet.

## 2012-03-08 NOTE — Telephone Encounter (Signed)
No pharmacy listed, called and left VM for pt to call back with a pharmacy she wanted to use.  Caralee Ates, CMA

## 2012-03-08 NOTE — Telephone Encounter (Signed)
Called in prescription to [facility].  Caralee Ates, CMA

## 2012-03-10 ENCOUNTER — Other Ambulatory Visit: Payer: Self-pay | Admitting: *Deleted

## 2012-03-10 MED ORDER — ISOSORBIDE MONONITRATE ER 60 MG PO TB24: 60.0000 mg | ORAL_TABLET | Freq: Every day | ORAL | Status: DC

## 2012-03-10 MED ORDER — ISOSORBIDE MONONITRATE ER 60 MG PO TB24: 30.0000 mg | ORAL_TABLET | Freq: Every day | ORAL | Status: DC

## 2012-03-16 ENCOUNTER — Other Ambulatory Visit: Payer: Self-pay | Admitting: *Deleted

## 2012-03-16 MED ORDER — AMLODIPINE BESYLATE 10 MG PO TABS: 10.0000 mg | ORAL_TABLET | Freq: Every day | ORAL | Status: DC

## 2012-03-17 ENCOUNTER — Encounter: Payer: Self-pay | Admitting: Cardiology

## 2012-03-17 DIAGNOSIS — R609 Edema, unspecified: Secondary | ICD-10-CM | POA: Insufficient documentation

## 2012-03-17 DIAGNOSIS — R9430 Abnormal result of cardiovascular function study, unspecified: Secondary | ICD-10-CM | POA: Insufficient documentation

## 2012-03-20 ENCOUNTER — Encounter: Payer: Self-pay | Admitting: Cardiology

## 2012-03-20 ENCOUNTER — Ambulatory Visit (INDEPENDENT_AMBULATORY_CARE_PROVIDER_SITE_OTHER): Payer: Medicare Other | Admitting: Cardiology

## 2012-03-20 VITALS — BP 130/70 | HR 76 | Ht 64.0 in | Wt 181.1 lb

## 2012-03-20 DIAGNOSIS — R609 Edema, unspecified: Secondary | ICD-10-CM

## 2012-03-20 DIAGNOSIS — R079 Chest pain, unspecified: Secondary | ICD-10-CM

## 2012-03-20 DIAGNOSIS — F0390 Unspecified dementia without behavioral disturbance: Secondary | ICD-10-CM

## 2012-03-20 DIAGNOSIS — R600 Localized edema: Secondary | ICD-10-CM

## 2012-03-20 MED ORDER — FUROSEMIDE 40 MG PO TABS: 40.0000 mg | ORAL_TABLET | Freq: Every day | ORAL | Status: DC

## 2012-03-20 MED ORDER — POTASSIUM CHLORIDE CRYS ER 20 MEQ PO TBCR: 20.0000 meq | EXTENDED_RELEASE_TABLET | Freq: Every day | ORAL | Status: DC

## 2012-03-20 NOTE — Assessment & Plan Note (Signed)
Her edema is the major issue at this time. She had good left ventricular function by echo in the hospital. She does have diastolic dysfunction. She was sent home on Lasix but it is no longer listed in her medicines from the nursing home. We will be sure that she is put back on Lasix and that she remains on it. Also low-salt diet is recommended. She does not appear to drink excess fluid.

## 2012-03-20 NOTE — Patient Instructions (Addendum)
**Note De-Identified  Obfuscation** Your physician has recommended you make the following change in your medication: start taking Lasix 40 mg daily and Potassium 20 meq. Daily  Your physician recommends that you schedule a follow-up appointment in: 2 weeks

## 2012-03-20 NOTE — Progress Notes (Signed)
HPI  Valerie Chen is seen to followup coronary disease and fluid overload. She was seen last in the office on January 27, 2012 area she then had another hospitalization. This occurred in September, 2013. She had edema. Her ejection fraction was normal. There was no deep venous thrombosis and there was no pulmonary embolus. She was diuresis and she was sent home. He was ordered that she have a low-salt diet. Also she went home on Lasix 20 mg daily.  The Valerie Chen is here today with one of her daughters. She mentions that she has had return of swelling in both legs. The left leg is greater than the right leg. The legs are not as tight as they were before hospitalization. Valerie Chen is not having any significant shortness of breath.  As part of today's evaluation I have reviewed all the prior office records. I've also carefully reviewed the hospital records from her most recent hospitalization including a CT scan, echo, and discharge summary.  No Known Allergies  Current Outpatient Prescriptions  Medication Sig Dispense Refill  . aspirin (ASPIR-LOW) 81 MG EC tablet Take 81 mg by mouth daily.        . cyanocobalamin ([employer] VITAMIN B12) 1000 MCG tablet Take 1,000 mcg by mouth daily.       Marland Kitchen HYDROcodone-acetaminophen (NORCO/VICODIN) 5-325 MG per tablet Take 1 tablet by mouth every 6 (six) hours as needed. For severe pain      . hyoscyamine (LEVSIN, ANASPAZ) 0.125 MG tablet Take 0.125 mg by mouth every 6 (six) hours as needed. For abdominal pain      . meclizine (ANTIVERT) 12.5 MG tablet Take 25 mg by mouth 2 (two) times daily as needed. For dizziness      . nitroGLYCERIN (NITROSTAT) 0.4 MG SL tablet Place 0.4 mg under the tongue every 5 (five) minutes as needed. For chest pain  - up to 3 doses - if no relief call 911      . pravastatin (PRAVACHOL) 10 MG tablet Take 10 mg by mouth at bedtime.      . promethazine (PHENERGAN) 12.5 MG tablet Take 12.5 mg by mouth every 6 (six) hours as needed. For nausea      .  QUEtiapine (SEROQUEL) 25 MG tablet Take 12.5 mg by mouth at bedtime.       . traMADol (ULTRAM) 50 MG tablet Take 100 mg by mouth every 8 (eight) hours as needed. For mild to moderate pain        History   Social History  . Marital Status: Widowed    Spouse Name: N/A    Number of Children: 3  . Years of Education: N/A   Occupational History  . RETIRED    Social History Main Topics  . Smoking status: Never Smoker   . Smokeless tobacco: Never Used  . Alcohol Use: No  . Drug Use: No  . Sexually Active: Not on file   Other Topics Concern  . Not on file   Social History Narrative   2 husbands3 children    Family History  Problem Relation Age of Onset  . Heart attack Father     several heart attacks  . Hypertension Daughter     Past Medical History  Diagnosis Date  . Chest pain     Hospital February, 2012, nuclear, possible anteroseptal and inferoseptal ischemia, technically difficult, medical therapy  . Dyslipidemia   . Orthostatic hypotension   . Migraines     27 year  . Dementia  Significant  . Ejection fraction     EF 55%, echo, February, 2012  //   EF 55-60%, echo, February 25, 2012  . Diastolic dysfunction     Echo, February, 2012  . TIA (transient ischemic attack)        ???question of a TIA in February, 2012, carotid Doppler, August 11, 2010 no significant abnormalities  . GI bleed     February, 2012 incomplete colonoscopy, hemorrhoids, needs complete colonoscopy  . CAD (coronary artery disease)     Probable CAD, nuclear February, 2012, possible anteroseptal and inferoseptal ischemia but the study was technically difficult  . Hyperlipidemia   . Edema     hospitalizations September, 2013, some fluid overload    Past Surgical History  Procedure Date  . Abdominal hysterectomy   . Diagnostic mammogram 12/29/2000    Mammogram, suspicious lesions -BX pending    ROS   Valerie Chen denies fever, chills, headache, sweats, rash, change in vision, change in  hearing, chest pain, cough, nausea vomiting, urinary symptoms. All other systems are reviewed and are negative.  PHYSICAL EXAM   The Valerie Chen is oriented to person time and place. She has some memory difficulties. She's here with her daughter. There is no jugulovenous distention. Lungs are clear. Respiratory effort is nonlabored. Cardiac exam reveals S1 and S2. There no clicks or significant murmurs. The abdomen is soft. The Valerie Chen does have 1+ edema in the right leg and 1-2+ edema in the left leg. There no musculoskeletal deformities. There are no skin rashes.  Filed Vitals:   03/20/12 1610  BP: 130/70  Pulse: 76  Height: 5\' 4"  (1.626 m)  Weight: 181 lb 1.9 oz (82.155 kg)  SpO2: 95%     ASSESSMENT & PLAN

## 2012-03-20 NOTE — Assessment & Plan Note (Signed)
The patient is not having any recurrent chest discomfort. She and her family have not wanted heart catheterization. Medical therapy has been indicated and appropriate.

## 2012-03-20 NOTE — Assessment & Plan Note (Signed)
This does play a role with her care. She is in a nursing facility.

## 2012-04-05 ENCOUNTER — Encounter: Payer: Self-pay | Admitting: Physician Assistant

## 2012-04-05 ENCOUNTER — Ambulatory Visit (INDEPENDENT_AMBULATORY_CARE_PROVIDER_SITE_OTHER): Payer: Medicare Other | Admitting: Physician Assistant

## 2012-04-05 VITALS — BP 140/80 | HR 72 | Ht 62.0 in | Wt 183.0 lb

## 2012-04-05 DIAGNOSIS — R609 Edema, unspecified: Secondary | ICD-10-CM

## 2012-04-05 DIAGNOSIS — I2510 Atherosclerotic heart disease of native coronary artery without angina pectoris: Secondary | ICD-10-CM

## 2012-04-05 DIAGNOSIS — I10 Essential (primary) hypertension: Secondary | ICD-10-CM

## 2012-04-05 MED ORDER — POTASSIUM CHLORIDE CRYS ER 20 MEQ PO TBCR: 20.0000 meq | EXTENDED_RELEASE_TABLET | Freq: Two times a day (BID) | ORAL | Status: DC

## 2012-04-05 MED ORDER — FUROSEMIDE 40 MG PO TABS: 40.0000 mg | ORAL_TABLET | Freq: Two times a day (BID) | ORAL | Status: DC

## 2012-04-05 MED ORDER — ISOSORBIDE MONONITRATE ER 60 MG PO TB24: 60.0000 mg | ORAL_TABLET | Freq: Two times a day (BID) | ORAL | Status: DC

## 2012-04-05 MED ORDER — AMLODIPINE BESYLATE 5 MG PO TABS: 5.0000 mg | ORAL_TABLET | Freq: Every day | ORAL | Status: DC

## 2012-04-05 NOTE — Progress Notes (Signed)
[address] [place], [place]  [address] Phone: [phone] Fax:  [phone]  Date:  04/05/2012   Name:  Valerie Chen   DOB:  May 27, 1929   MRN:  [id number]  PCP:  Marletta Lor, NP  Primary Cardiologist:  Dr. Zackery Barefoot  Primary Electrophysiologist:  None    History of Present Illness: Valerie Chen is a 78 y.o. female who returns for f/u.    She was initially seen by Dr. Myrtis Ser in 08/2010 at [facility] with abdominal and chest pain.  She ruled out for myocardial infarction.  Nuclear scan raised the possibility of anteroseptal and inferior septal ischemia.  She had normal wall motion and normal LVF on echo.  It is presumed that she has CAD.  She did have a GI bleed complicating her hospitalization.  She has dementia.  She has been treated medically over time.    Dr. Myrtis Ser saw her recently on 03/20/12. She had been admitted to the hospital in 9/13 for lower extremity edema. CT was negative for pulmonary embolism. Venous Dopplers were negative for DVT.  Echo 02/25/12: Mild LVH, EF 55-60%, grade 1 diastolic dysfunction, trivial AI, mild MR. Dr. Myrtis Ser in restarted her Lasix and brought her back today for follow up.  She continues to have intermittent chest pain. Overall, this remains stable. She denies significant dyspnea, orthopnea or PND. Her LE edema is marginally better. Her weights have not changed. She denies cough.  Past Medical History  Diagnosis Date  . Chest pain     Hospital February, 2012, nuclear, possible anteroseptal and inferoseptal ischemia, technically difficult, medical therapy  . Dyslipidemia   . Orthostatic hypotension   . Migraines     25 year  . Dementia     Significant  . Ejection fraction     EF 55%, echo, February, 2012  //   EF 55-60%, echo, February 25, 2012  . Diastolic dysfunction     Echo, February, 2012  . TIA (transient ischemic attack)        ???question of a TIA in February, 2012, carotid Doppler, August 11, 2010 no  significant abnormalities  . GI bleed     February, 2012 incomplete colonoscopy, hemorrhoids, needs complete colonoscopy  . CAD (coronary artery disease)     Probable CAD, nuclear February, 2012, possible anteroseptal and inferoseptal ischemia but the study was technically difficult  . Hyperlipidemia   . Edema     hospitalizations September, 2013, some fluid overload    Current Outpatient Prescriptions  Medication Sig Dispense Refill  . amLODipine (NORVASC) 10 MG tablet Take 10 mg by mouth daily.      Marland Kitchen aspirin (ASPIR-LOW) 81 MG EC tablet Take 81 mg by mouth daily.        . cholecalciferol (VITAMIN D) 1000 UNITS tablet Take 2,000ius daily      . cyanocobalamin ([employer] VITAMIN B12) 1000 MCG tablet Take 1,000 mcg by mouth daily.       . furosemide (LASIX) 40 MG tablet Take 1 tablet (40 mg total) by mouth daily.  90 tablet  3  . HYDROcodone-acetaminophen (NORCO/VICODIN) 5-325 MG per tablet Take 1 tablet by mouth every 6 (six) hours as needed. For severe pain      . hyoscyamine (LEVSIN, ANASPAZ) 0.125 MG tablet Take 0.125 mg by mouth every 6 (six) hours as needed. For abdominal pain      . isosorbide mononitrate (IMDUR) 60 MG 24 hr tablet Take 60mg  mg in the am and 1/2  in the pm      . meclizine (ANTIVERT) 12.5 MG tablet Take 25 mg by mouth 2 (two) times daily as needed. For dizziness      . mirtazapine (REMERON) 15 MG tablet Take 15 mg by mouth at bedtime.      . nitroGLYCERIN (NITROSTAT) 0.4 MG SL tablet Place 0.4 mg under the tongue every 5 (five) minutes as needed. For chest pain  - up to 3 doses - if no relief call 911      . pantoprazole (PROTONIX) 40 MG tablet Take 40 mg by mouth daily.      . potassium chloride SA (K-DUR,KLOR-CON) 20 MEQ tablet Take 1 tablet (20 mEq total) by mouth daily.  90 tablet  3  . pravastatin (PRAVACHOL) 10 MG tablet Take 10 mg by mouth at bedtime.      . promethazine (PHENERGAN) 12.5 MG tablet Take 12.5 mg by mouth every 6 (six) hours as needed. For nausea      .  QUEtiapine (SEROQUEL) 25 MG tablet Take 12.5 mg by mouth at bedtime.       . traMADol (ULTRAM) 50 MG tablet Take 100 mg by mouth every 8 (eight) hours as needed. For mild to moderate pain        Allergies: No Known Allergies  History  Substance Use Topics  . Smoking status: Never Smoker   . Smokeless tobacco: Never Used  . Alcohol Use: No     ROS:  Please see the history of present illness.   All other systems reviewed and negative.   PHYSICAL EXAM: VS:  BP 140/80  Pulse 72  Ht 5\' 2"  (1.575 m)  Wt 183 lb (83.008 kg)  BMI 33.47 kg/m2 Well nourished, well developed, in no acute distress HEENT: normal Neck: no JVD Cardiac:  normal S1, S2; RRR; no murmur Lungs:  clear to auscultation bilaterally, no wheezing, rhonchi or rales Abd: soft, nontender, no hepatomegaly Ext: Tight 1+ bilateral LE edema Skin: warm and dry Neuro:  CNs 2-12 intact, no focal abnormalities noted  EKG:  NSR, HR 72, normal axis, poor R wave progression, no change from prior tracing      ASSESSMENT AND PLAN:  1. Edema:  I adjusted her amlodipine a couple of months ago. She does have diastolic dysfunction on her echocardiogram. It sounds as though her diet not all that rich in salt.  She has had marginal improvement with higher dose Lasix.  She is now wearing compression stockings.  I have suggested the following:    A. Decreased Norvasc to 5 mg QD.    B. Increase Lasix to 40 mg bid.    C. Increase K+ to 20 mEq bid.    D. Increase Isosorbide to 60 mg BID.    E. Check BMET today an repeat in in one week.    F.  Follow up with me or Dr. Zackery Barefoot in 1 month.   2. Presumed CAD:  Her chest pain is stable.  Since I am decreasing her norvasc, I will increase her nitrates as noted.  Signed, Tereso Newcomer, PA-C  4:16 PM 04/05/2012

## 2012-04-05 NOTE — Patient Instructions (Addendum)
KEEP LEGS ELEVATED WHEN SITTING   Your physician has recommended you make the following change in your medication: INCREASE LASIX 40 MG TWICE DAILY; INCREASE POTASSIUM 20 MEQ TWICE DAILY; INCREASE IMDUR 60 MG TWICE DAILY; DECREASE NORVASC 5 MG DAILY  Your physician recommends that you return for lab work in: TODAY BMET  PLEASE HAVE [facility] GET LAB WORK IN 1 WEEK WITH RESULTS FAXED TO [place], [place] [phone]  Your physician recommends that you schedule a follow-up appointment in: 05/11/12 @ 3:40 WITH SCOTT WEAVER, PAC SAME DAY DR. KATZ IS IN THE OFFICE      2 Gram Low Sodium Diet A 2 gram sodium diet restricts the amount of sodium in the diet to no more than 2 g or 2000 mg daily. Limiting the amount of sodium is often used to help lower blood pressure. It is important if you have heart, liver, or kidney problems. Many foods contain sodium for flavor and sometimes as a preservative. When the amount of sodium in a diet needs to be low, it is important to know what to look for when choosing foods and drinks. The following includes some information and guidelines to help make it easier for you to adapt to a low sodium diet. QUICK TIPS  Do not add salt to food.  Avoid convenience items and fast food.  Choose unsalted snack foods.  Buy lower sodium products, often labeled as "lower sodium" or "no salt added."  Check food labels to learn how much sodium is in 1 serving.  When eating at a restaurant, ask that your food be prepared with less salt or none, if possible. READING FOOD LABELS FOR SODIUM INFORMATION The nutrition facts label is a good place to find how much sodium is in foods. Look for products with no more than 500 to 600 mg of sodium per meal and no more than 150 mg per serving. Remember that 2 g = 2000 mg. The food label may also list foods as:  Sodium-free: Less than 5 mg in a serving.  Very low sodium: 35 mg or less in a serving.  Low-sodium: 140 mg or less in a  serving.  Light in sodium: 50% less sodium in a serving. For example, if a food that usually has 300 mg of sodium is changed to become light in sodium, it will have 150 mg of sodium.  Reduced sodium: 25% less sodium in a serving. For example, if a food that usually has 400 mg of sodium is changed to reduced sodium, it will have 300 mg of sodium. CHOOSING FOODS Grains  Avoid: Salted crackers and snack items. Some cereals, including instant hot cereals. Bread stuffing and biscuit mixes. Seasoned rice or pasta mixes.  Choose: Unsalted snack items. Low-sodium cereals, oats, puffed wheat and rice, shredded wheat. English muffins and bread. Pasta. Meats  Avoid: Salted, canned, smoked, spiced, pickled meats, including fish and poultry. Bacon, ham, sausage, cold cuts, hot dogs, anchovies.  Choose: Low-sodium canned tuna and salmon. Fresh or frozen meat, poultry, and fish. Dairy  Avoid: Processed cheese and spreads. Cottage cheese. Buttermilk and condensed milk. Regular cheese.  Choose: Milk. Low-sodium cottage cheese. Yogurt. Sour cream. Low-sodium cheese. Fruits and Vegetables  Avoid: Regular canned vegetables. Regular canned tomato sauce and paste. Frozen vegetables in sauces. Olives. Rosita Fire. Relishes. Sauerkraut.  Choose: Low-sodium canned vegetables. Low-sodium tomato sauce and paste. Frozen or fresh vegetables. Fresh and frozen fruit. Condiments  Avoid: Canned and packaged gravies. Worcestershire sauce. Tartar sauce. Barbecue sauce.  Soy sauce. Steak sauce. Ketchup. Onion, garlic, and table salt. Meat flavorings and tenderizers.  Choose: Fresh and dried herbs and spices. Low-sodium varieties of mustard and ketchup. Lemon juice. Tabasco sauce. Horseradish. SAMPLE 2 GRAM SODIUM MEAL PLAN Breakfast / Sodium (mg)  1 cup low-fat milk / 143 mg  2 slices whole-wheat toast / 270 mg  1 tbs heart-healthy margarine / 153 mg  1 hard-boiled egg / 139 mg  1 small orange / 0 mg Lunch /  Sodium (mg)  1 cup raw carrots / 76 mg   cup hummus / 298 mg  1 cup low-fat milk / 143 mg   cup red grapes / 2 mg  1 whole-wheat pita bread / 356 mg Dinner / Sodium (mg)  1 cup whole-wheat pasta / 2 mg  1 cup low-sodium tomato sauce / 73 mg  3 oz lean ground beef / 57 mg  1 small side salad (1 cup raw spinach leaves,  cup cucumber,  cup yellow bell pepper) with 1 tsp olive oil and 1 tsp red wine vinegar / 25 mg Snack / Sodium (mg)  1 container low-fat vanilla yogurt / 107 mg  3 graham cracker squares / 127 mg Nutrient Analysis  Calories: 2033  Protein: 77 g  Carbohydrate: 282 g  Fat: 72 g  Sodium: 1971 mg Document Released: 06/07/2005 Document Revised: 08/30/2011 Document Reviewed: 09/08/2009 [facility] Patient Information 2013 [place], [place].

## 2012-04-06 ENCOUNTER — Telehealth: Payer: Self-pay | Admitting: *Deleted

## 2012-04-06 LAB — BASIC METABOLIC PANEL
Calcium: 9.3 mg/dL (ref 8.4–10.5)
Creatinine, Ser: 1 mg/dL (ref 0.4–1.2)
GFR: 58.51 mL/min — ABNORMAL LOW (ref >60.00)
Sodium: 139 mEq/L (ref 135–145)

## 2012-04-06 NOTE — Telephone Encounter (Signed)
pt's daughter Valerie Bible notified about lab results w/verbal understanding today

## 2012-04-06 NOTE — Telephone Encounter (Signed)
Message copied by Tarri Fuller on Thu Apr 06, 2012  3:50 PM ------      Message from: [place], [place] T      Created: Thu Apr 06, 2012  2:00 PM       [facility]      Continue with current treatment plan.      Tereso Newcomer, PA-C  2:00 PM 04/06/2012

## 2012-05-04 ENCOUNTER — Encounter (HOSPITAL_COMMUNITY): Payer: Self-pay | Admitting: Emergency Medicine

## 2012-05-04 ENCOUNTER — Emergency Department (HOSPITAL_COMMUNITY)
Admission: EM | Admit: 2012-05-04 | Discharge: 2012-05-04 | Disposition: A | Discharge status: Home / Self Care | Payer: Medicare Other | Attending: Emergency Medicine | Admitting: Emergency Medicine

## 2012-05-04 ENCOUNTER — Emergency Department (HOSPITAL_COMMUNITY): Payer: Medicare Other

## 2012-05-04 DIAGNOSIS — Z90710 Acquired absence of both cervix and uterus: Secondary | ICD-10-CM | POA: Insufficient documentation

## 2012-05-04 DIAGNOSIS — Z7982 Long term (current) use of aspirin: Secondary | ICD-10-CM | POA: Insufficient documentation

## 2012-05-04 DIAGNOSIS — G43909 Migraine, unspecified, not intractable, without status migrainosus: Secondary | ICD-10-CM | POA: Insufficient documentation

## 2012-05-04 DIAGNOSIS — Z8673 Personal history of transient ischemic attack (TIA), and cerebral infarction without residual deficits: Secondary | ICD-10-CM | POA: Insufficient documentation

## 2012-05-04 DIAGNOSIS — Z79899 Other long term (current) drug therapy: Secondary | ICD-10-CM | POA: Insufficient documentation

## 2012-05-04 DIAGNOSIS — E785 Hyperlipidemia, unspecified: Secondary | ICD-10-CM | POA: Insufficient documentation

## 2012-05-04 DIAGNOSIS — I509 Heart failure, unspecified: Secondary | ICD-10-CM | POA: Insufficient documentation

## 2012-05-04 DIAGNOSIS — M542 Cervicalgia: Secondary | ICD-10-CM

## 2012-05-04 DIAGNOSIS — Z8719 Personal history of other diseases of the digestive system: Secondary | ICD-10-CM | POA: Insufficient documentation

## 2012-05-04 DIAGNOSIS — F0390 Unspecified dementia without behavioral disturbance: Secondary | ICD-10-CM | POA: Insufficient documentation

## 2012-05-04 DIAGNOSIS — R079 Chest pain, unspecified: Secondary | ICD-10-CM

## 2012-05-04 DIAGNOSIS — I2510 Atherosclerotic heart disease of native coronary artery without angina pectoris: Secondary | ICD-10-CM | POA: Insufficient documentation

## 2012-05-04 LAB — COMPREHENSIVE METABOLIC PANEL
AST: 19 U/L (ref 0–37)
Albumin: 3.9 g/dL (ref 3.5–5.2)
Alkaline Phosphatase: 68 U/L (ref 39–117)
Chloride: 101 mEq/L (ref 96–112)
Potassium: 3.9 mEq/L (ref 3.5–5.1)
Total Bilirubin: 0.4 mg/dL (ref 0.3–1.2)
Total Protein: 7 g/dL (ref 6.0–8.3)

## 2012-05-04 LAB — CBC WITH DIFFERENTIAL/PLATELET
Eosinophils Absolute: 0 10*3/uL (ref 0.0–0.7)
Eosinophils Relative: 0 % (ref 0–5)
HCT: 43.3 % (ref 36.0–46.0)
Lymphocytes Relative: 37 % (ref 12–46)
Lymphs Abs: 1.9 10*3/uL (ref 0.7–4.0)
MCH: 30.3 pg (ref 26.0–34.0)
MCV: 88.5 fL (ref 78.0–100.0)
Monocytes Absolute: 0.4 10*3/uL (ref 0.1–1.0)
Platelets: 181 10*3/uL (ref 150–400)
RBC: 4.89 MIL/uL (ref 3.87–5.11)
RDW: 12.8 % (ref 11.5–15.5)

## 2012-05-04 LAB — TROPONIN I: Troponin I: <0.3 ng/mL (ref <0.30)

## 2012-05-04 NOTE — ED Provider Notes (Signed)
History     CSN: [id number]  Arrival date & time 05/04/12  1640   First MD Initiated Contact with Patient 05/04/12 1653      Chief Complaint  Patient presents with  . Weakness    (Consider location/radiation/quality/duration/timing/severity/associated sxs/prior treatment) HPIBetty Chen is a 78 y.o. female presenting for 2 different complaints. Patient is complaining about neck pain that starts at the base of the lateral neck on the left, his travels in a lancinating manner to the base of the mastoid, this is happened 3 times, is been intermittent, 10/10 in intensity, she has not taken anything for it. No history of shingles, no history of rash, she has not received shingles vaccine. No jaw claudication, no current headache, no temporal pain. She does occasionally get mild headaches, not associated with this neck pain.  She's also had complaints of chest pain today. She says this is pressure, in the center of her chest, it is currently not present, has not been associated with radiation to the neck or to the arms, not associated with nausea vomiting, diaphoresis, shortness of breath. This pain has been mild to moderate, it is gone, it came on while resting. Patient has known coronary artery disease and sees cardiologist Dr. Myrtis Ser. According to the family, no intervention concerning coronary stenting/angioplasty will be done because of patient's age and dementia. Patient does have a history of some CHF she typically is wearing TED hose, takes Lasix and potassium supplementation.  She denies any fevers, chills, productive cough, upper respiratory symptoms, sinus pain, ear pain, changes in hearing, tinnitus, dizziness or lightheadedness.  Past Medical History  Diagnosis Date  . Chest pain     Hospital February, 2012, nuclear, possible anteroseptal and inferoseptal ischemia, technically difficult, medical therapy  . Dyslipidemia   . Orthostatic hypotension   . Migraines     25 year  .  Dementia     Significant  . Ejection fraction     EF 55%, echo, February, 2012  //   EF 55-60%, echo, February 25, 2012  . Diastolic dysfunction     Echo, February, 2012  . TIA (transient ischemic attack)        ???question of a TIA in February, 2012, carotid Doppler, August 11, 2010 no significant abnormalities  . GI bleed     February, 2012 incomplete colonoscopy, hemorrhoids, needs complete colonoscopy  . CAD (coronary artery disease)     Probable CAD, nuclear February, 2012, possible anteroseptal and inferoseptal ischemia but the study was technically difficult  . Hyperlipidemia   . Edema     hospitalizations September, 2013, some fluid overload    Past Surgical History  Procedure Date  . Abdominal hysterectomy   . Diagnostic mammogram 12/29/2000    Mammogram, suspicious lesions -BX pending    Family History  Problem Relation Age of Onset  . Heart attack Father     several heart attacks  . Hypertension Daughter     History  Substance Use Topics  . Smoking status: Never Smoker   . Smokeless tobacco: Never Used  . Alcohol Use: No    OB History    Grav Para Term Preterm Abortions TAB SAB Ect Mult Living                  Review of Systems At least 10pt or greater review of systems completed and are negative except where specified in the HPI.  Allergies  Review of patient's allergies indicates no known allergies.  Home Medications  Current Outpatient Rx  Name  Route  Sig  Dispense  Refill  . AMLODIPINE BESYLATE 5 MG PO TABS   Oral   Take 1 tablet (5 mg total) by mouth daily.         . ASPIRIN EC 81 MG PO TBEC   Oral   Take 81 mg by mouth every morning.         Marland Kitchen CYANOCOBALAMIN 1000 MCG PO TABS   Oral   Take 100 mcg by mouth every morning.         . FUROSEMIDE 40 MG PO TABS   Oral   Take 40 mg by mouth every morning.         Marland Kitchen HYDROCODONE-ACETAMINOPHEN 5-325 MG PO TABS   Oral   Take 1 tablet by mouth every 6 (six) hours as needed. For  severe pain         . HYOSCYAMINE SULFATE 0.125 MG PO TABS   Oral   Take 0.125 mg by mouth every 6 (six) hours as needed. For abdominal pain         . ISOSORBIDE MONONITRATE ER 60 MG PO TB24   Oral   Take 60 mg by mouth 2 (two) times daily.         Marland Kitchen MECLIZINE HCL 12.5 MG PO TABS   Oral   Take 25 mg by mouth 2 (two) times daily as needed. For dizziness         . MIRTAZAPINE 15 MG PO TABS   Oral   Take 15 mg by mouth at bedtime.         Marland Kitchen NITROGLYCERIN 0.4 MG SL SUBL   Sublingual   Place 0.4 mg under the tongue every 5 (five) minutes as needed. For chest pain  - up to 3 doses - if no relief call 911         . PANTOPRAZOLE SODIUM 40 MG PO TBEC   Oral   Take 40 mg by mouth daily before breakfast.          . POTASSIUM CHLORIDE CRYS ER 20 MEQ PO TBCR   Oral   Take 20 mEq by mouth 2 (two) times daily.         Marland Kitchen PRAVASTATIN SODIUM 10 MG PO TABS   Oral   Take 10 mg by mouth at bedtime.         Marland Kitchen PROMETHAZINE HCL 12.5 MG PO TABS   Oral   Take 12.5 mg by mouth every 6 (six) hours as needed. For nausea         . QUETIAPINE FUMARATE 25 MG PO TABS   Oral   Take 12.5 mg by mouth at bedtime.          . TRAMADOL HCL 50 MG PO TABS   Oral   Take 100 mg by mouth every 8 (eight) hours as needed. For mild to moderate pain           BP 138/82  Pulse 62  Temp 97.6 F (36.4 C) (Oral)  Resp 18  SpO2 99%  Physical Exam  Nursing notes reviewed.  Electronic medical record reviewed. VITAL SIGNS:   Filed Vitals:   05/04/12 1643 05/04/12 1651 05/04/12 1700  BP:  142/86 138/82  Pulse:  67 62  Temp:  97.6 F (36.4 C)   TempSrc:  Oral   Resp:  11 18  SpO2: 90% 99% 99%   CONSTITUTIONAL: Awake, oriented, appears non-toxic HENT: Atraumatic, normocephalic, scalp nontender  to palpation, nontender over temporal artery, oral mucosa pink and moist, airway patent. Nares patent without drainage. External ears normal. Your canals clear, TMs clear bilaterally. EYES:  Conjunctiva clear, EOMI, PERRLA NECK: Trachea midline, non-tender, supple. No lymphadenopathy, no erythema. No bruits bilaterally. No tenderness to palpation. CARDIOVASCULAR: Normal heart rate, Normal rhythm, No murmurs, rubs, gallops PULMONARY/CHEST: Clear to auscultation, no rhonchi, wheezes, or rales. Symmetrical breath sounds. Non-tender. ABDOMINAL: Non-distended, soft, non-tender - no rebound or guarding.  BS normal. NEUROLOGIC: Non-focal, moving all four extremities, no gross sensory or motor deficits. EXTREMITIES: No clubbing, cyanosis, or edema SKIN: Warm, Dry, No erythema, No rash  ED Course  Procedures (including critical care time)  Date: 05/04/2012  Rate: 67  Rhythm: normal sinus rhythm  QRS Axis: normal  Intervals: normal  ST/T Wave abnormalities: normal  Conduction Disutrbances: none  Narrative Interpretation: unremarkable - no ST or T wave changes suggestive of acute ischemia or infarction. No change with prior EKG dated 04/05/2012    Labs Reviewed  COMPREHENSIVE METABOLIC PANEL - Abnormal; Notable for the following:    GFR calc non Af Amer 77 (*)     GFR calc Af Amer 89 (*)     All other components within normal limits  CBC WITH DIFFERENTIAL  TROPONIN I   Dg Chest 2 View  05/04/2012  *RADIOLOGY REPORT*  Clinical Data: Weakness  CHEST - 2 VIEW  Comparison: CT chest dated 02/25/2012  Findings: Lungs are essentially clear.  Mild lower lobe scarring/atelectasis.  No focal consolidation.  No pleural effusion or pneumothorax.  Cardiomediastinal silhouette is within normal limits.  Mild degenerative changes of the visualized thoracolumbar spine.  IMPRESSION: No evidence of acute cardiopulmonary disease.   Original Report Authenticated By: Charline Bills, M.D.      1. Chest pain   2. Neck pain      Medications  aspirin EC 81 MG tablet (not administered)  potassium chloride SA (K-DUR,KLOR-CON) 20 MEQ tablet (not administered)  furosemide (LASIX) 40 MG tablet (not  administered)  isosorbide mononitrate (IMDUR) 60 MG 24 hr tablet (not administered)  cyanocobalamin 1000 MCG tablet (not administered)     MDM  Cathryne Mancebo is a 78 y.o. female with a pertinent cardiac history with no plans for intervention because she is thought to be a poor candidate for angioplasty/stenting. Patient is DO NOT RESUSCITATE. She presents today with chest pain-daughter and patient both together since they typically don't find anything when she has symptoms and air likely related to her chronic cardiac disease. Patient is most concerned about left-sided neck pain. In her description of it, it certainly sounds like it could be associated with shingles, however there is no obvious rash. There are no physical exam findings leading me to think it is a rheumatologic cause or infectious source.    Obtain some basic labs to rule out cardiac cause, troponin is negative, she's not been short of breath so I did not obtain a BNP, her lungs are clear, chest x-ray shows no evidence of acute cardiopulmonary disease.  Patient feels much better, and has been asymptomatic since being in the emergency department. She would like to go home. I do not think there is any further benefit in keeping the patient in the hospital for further cardiac enzyme cycling. Is been noted before that she is not candidate for stenting or angioplasty - so she will continue with medical management.  Patient understands she can return to the emergency department for any change in her symptom status.  I explained the diagnosis and have given explicit precautions to return to the ER including worsening neck pain, rash on the neck, pain on chewing, severe headache, neurologic deficits, persistent chest pain, shortness of breath or any other new or worsening symptoms. The patient understands and accepts the medical plan as it's been dictated and I have answered their questions. Discharge instructions concerning home care and  prescriptions have been given.  The patient is STABLE and is discharged to home in good condition.            Jones Skene, MD 05/04/12 2004

## 2012-05-04 NOTE — ED Notes (Signed)
Old and new EKG handed to Dr. Rulon Abide.  Extra copies of each placed in pt chart

## 2012-05-04 NOTE — ED Notes (Signed)
Per EMS - pt c/o strange feeling in left side of neck and behind ear. Pt denies CP. 12lead unremarkable. Pt reports she feels like she needs to take a deep breath but can't. Pt has cardiac hx. Pt from nursing home, staff gave 81mg  of ASA, EMS administered 3 more 81mg  of ASA. Staff at nursing home reports she looks more pale than normal. EMS started a 20G in left hand. Pt has hx of dementia. Pt normally wears TED hose but hasn't been wearing them. Pt reports a generalized weakness.

## 2012-05-09 ENCOUNTER — Other Ambulatory Visit: Payer: Self-pay | Admitting: *Deleted

## 2012-05-09 MED ORDER — POTASSIUM CHLORIDE CRYS ER 20 MEQ PO TBCR: 20.0000 meq | EXTENDED_RELEASE_TABLET | Freq: Two times a day (BID) | ORAL | Status: DC

## 2012-05-10 ENCOUNTER — Telehealth: Payer: Self-pay | Admitting: Cardiology

## 2012-05-10 ENCOUNTER — Other Ambulatory Visit: Payer: Self-pay | Admitting: *Deleted

## 2012-05-10 DIAGNOSIS — I2510 Atherosclerotic heart disease of native coronary artery without angina pectoris: Secondary | ICD-10-CM

## 2012-05-10 DIAGNOSIS — R609 Edema, unspecified: Secondary | ICD-10-CM

## 2012-05-10 DIAGNOSIS — I10 Essential (primary) hypertension: Secondary | ICD-10-CM

## 2012-05-10 MED ORDER — POTASSIUM CHLORIDE CRYS ER 20 MEQ PO TBCR: 20.0000 meq | EXTENDED_RELEASE_TABLET | Freq: Two times a day (BID) | ORAL | Status: DC

## 2012-05-10 MED ORDER — AMLODIPINE BESYLATE 5 MG PO TABS: 5.0000 mg | ORAL_TABLET | Freq: Every day | ORAL | Status: DC

## 2012-05-11 ENCOUNTER — Encounter: Payer: Self-pay | Admitting: Physician Assistant

## 2012-05-11 ENCOUNTER — Ambulatory Visit (INDEPENDENT_AMBULATORY_CARE_PROVIDER_SITE_OTHER): Payer: Medicare Other | Admitting: Physician Assistant

## 2012-05-11 VITALS — BP 117/73 | HR 70 | Ht 63.0 in | Wt 180.1 lb

## 2012-05-11 DIAGNOSIS — I519 Heart disease, unspecified: Secondary | ICD-10-CM

## 2012-05-11 DIAGNOSIS — I2510 Atherosclerotic heart disease of native coronary artery without angina pectoris: Secondary | ICD-10-CM

## 2012-05-11 DIAGNOSIS — I10 Essential (primary) hypertension: Secondary | ICD-10-CM

## 2012-05-11 DIAGNOSIS — I5189 Other ill-defined heart diseases: Secondary | ICD-10-CM

## 2012-05-11 DIAGNOSIS — R609 Edema, unspecified: Secondary | ICD-10-CM

## 2012-05-11 NOTE — Patient Instructions (Addendum)
Your physician recommends that you schedule a follow-up appointment in: 08/11/12 @ 3 PM WITH DR. KATZ  NO CHANGES WERE MADE TODAY

## 2012-05-11 NOTE — Progress Notes (Signed)
[address] [place], [place]  [address] Phone: [phone]; Fax:  [phone]  Date:  05/11/2012   Name:  Valerie Chen   DOB:  1929/05/21   MRN:  [id number]  PCP:  Marletta Lor, NP => to see Dr. Beverley Fiedler next week to establish Primary Cardiologist:  Dr. Zackery Barefoot  Primary Electrophysiologist:  None    History of Present Illness: Valerie Chen is a 78 y.o. female who returns for f/u.    She was initially seen by Dr. Myrtis Ser in 08/2010 at [facility] with abdominal and chest pain.  She ruled out for myocardial infarction.  Nuclear scan raised the possibility of anteroseptal and inferior septal ischemia.  She had normal wall motion and normal LVF on echo.  It is presumed that she has CAD.  She did have a GI bleed complicating her hospitalization.  She has dementia.  She has been treated medically over time.    Dr. Myrtis Ser saw her 9/13. She had been admitted to the hospital for LE edema. CT was negative for pulmonary embolism. Venous Dopplers were negative for DVT.  Echo 02/25/12: Mild LVH, EF 55-60%, grade 1 diastolic dysfunction, trivial AI, mild MR. Dr. Myrtis Ser restarted her Lasix.  I saw her back in followup 04/05/12. Her edema was only marginally improved. I decreased her Norvasc in half. I increased her Lasix dose as well as her isosorbide for chest discomfort.  Since last being seen, she went to the ED last week with neck pain and headache as well as CP.  Troponin was negative.  No further workup was pursued.  Patient notes pain originated in left neck.  She points to her trapezius area.  It radiated up to her posterior scalp in the occipital region.  No scotoma.  No associated symptoms.  She has occasional chest pain.  Hx of this is limited due to dementia.  No reported increase in her symptoms.  She has dyspnea assoc with CP.  No syncope, orthopnea, PND.  LE edema is improved.    Labs (10/13):    K 3.8, creatinine 1 Labs (11/13):    K 3.9, creatinine 0.76, ALT  18  Wt Readings from Last 3 Encounters:  05/11/12 180 lb 1.9 oz (81.702 kg)  04/05/12 183 lb (83.008 kg)  03/20/12 181 lb 1.9 oz (82.155 kg)     Past Medical History  Diagnosis Date  . Chest pain     Hospital February, 2012, nuclear, possible anteroseptal and inferoseptal ischemia, technically difficult, medical therapy  . Dyslipidemia   . Orthostatic hypotension   . Migraines     25 year  . Dementia     Significant  . Ejection fraction     EF 55%, echo, February, 2012  //   EF 55-60%, echo, February 25, 2012  . Diastolic dysfunction     Echo, February, 2012  . TIA (transient ischemic attack)        ???question of a TIA in February, 2012, carotid Doppler, August 11, 2010 no significant abnormalities  . GI bleed     February, 2012 incomplete colonoscopy, hemorrhoids, needs complete colonoscopy  . CAD (coronary artery disease)     Probable CAD, nuclear February, 2012, possible anteroseptal and inferoseptal ischemia but the study was technically difficult  . Hyperlipidemia   . Edema     hospitalizations September, 2013, some fluid overload    Current Outpatient Prescriptions  Medication Sig Dispense Refill  . amLODipine (NORVASC) 5 MG tablet Take 1  tablet (5 mg total) by mouth daily.  30 tablet  6  . aspirin EC 81 MG tablet Take 81 mg by mouth every morning.      . cyanocobalamin 1000 MCG tablet Take 100 mcg by mouth every morning.      . furosemide (LASIX) 40 MG tablet Take 40 mg by mouth every morning.      Marland Kitchen HYDROcodone-acetaminophen (NORCO/VICODIN) 5-325 MG per tablet Take 1 tablet by mouth every 6 (six) hours as needed. For severe pain      . hyoscyamine (LEVSIN, ANASPAZ) 0.125 MG tablet Take 0.125 mg by mouth every 6 (six) hours as needed. For abdominal pain      . isosorbide mononitrate (IMDUR) 60 MG 24 hr tablet Take 60 mg by mouth 2 (two) times daily.      . meclizine (ANTIVERT) 12.5 MG tablet Take 25 mg by mouth 2 (two) times daily as needed. For dizziness      .  mirtazapine (REMERON) 15 MG tablet Take 15 mg by mouth at bedtime.      . nitroGLYCERIN (NITROSTAT) 0.4 MG SL tablet Place 0.4 mg under the tongue every 5 (five) minutes as needed. For chest pain  - up to 3 doses - if no relief call 911      . pantoprazole (PROTONIX) 40 MG tablet Take 40 mg by mouth daily before breakfast.       . potassium chloride SA (K-DUR,KLOR-CON) 20 MEQ tablet Take 1 tablet (20 mEq total) by mouth 2 (two) times daily.  60 tablet  6  . pravastatin (PRAVACHOL) 10 MG tablet Take 10 mg by mouth at bedtime.      . promethazine (PHENERGAN) 12.5 MG tablet Take 12.5 mg by mouth every 6 (six) hours as needed. For nausea      . QUEtiapine (SEROQUEL) 25 MG tablet Take 12.5 mg by mouth at bedtime.       . traMADol (ULTRAM) 50 MG tablet Take 100 mg by mouth every 8 (eight) hours as needed. For mild to moderate pain        Allergies:   No Known Allergies  Social History:  The patient  reports that she has never smoked. She has never used smokeless tobacco. She reports that she does not drink alcohol or use illicit drugs.   ROS:  Please see the history of present illness.   All other systems reviewed and negative.   PHYSICAL EXAM: VS:  BP 117/73  Pulse 70  Ht 5\' 3"  (1.6 m)  Wt 180 lb 1.9 oz (81.702 kg)  BMI 31.91 kg/m2 Well nourished, well developed, in no acute distress HEENT: normal Neck: no JVD Cardiac:  normal S1, S2; RRR; no murmur Lungs:  clear to auscultation bilaterally, no wheezing, rhonchi or rales Abd: soft, nontender, no hepatomegaly Ext: Trace bilateral LE edema Skin: warm and dry Neuro:  CNs 2-12 intact, no focal abnormalities noted  EKG:  NSR, HR 70, normal axis, poor R wave progression, no change from prior tracing    ASSESSMENT AND PLAN:  1. Edema:  This is improved.  Likely related to venous insufficiency in the setting of diastolic CHF.  Volume appears stable.  Continue current Rx.  She should continue wearing compression stockings.     2. Presumed CAD:   Her chest pain is stable.  Continue current Rx.  Could consider beta blocker therapy or Ranolazine in the future if needed.  3. Headache:   Overall, sounds MSK in nature.  May benefit  from prn muscle relaxer.  She sees Dr. Barbaraann Barthel next week and will d/w her.  4. Hypertension:   Controlled.  Continue current therapy.   Luna Glasgow, PA-C  4:24 PM 05/11/2012

## 2012-08-10 ENCOUNTER — Other Ambulatory Visit: Payer: Self-pay | Admitting: Family Medicine

## 2012-08-11 ENCOUNTER — Ambulatory Visit: Payer: Medicare Other | Admitting: Cardiology

## 2012-08-18 ENCOUNTER — Ambulatory Visit
Admission: RE | Admit: 2012-08-18 | Discharge: 2012-08-18 | Disposition: A | Discharge status: Home / Self Care | Payer: Medicare Other | Source: Ambulatory Visit | Attending: Family Medicine | Admitting: Family Medicine

## 2012-08-25 ENCOUNTER — Ambulatory Visit: Payer: Medicare Other | Admitting: Cardiology

## 2012-08-29 ENCOUNTER — Telehealth: Payer: Self-pay | Admitting: Cardiology

## 2012-08-29 NOTE — Telephone Encounter (Signed)
Pt new PCP at [facility] Carr will be calling tomorrow to get information and ask questions regarding pt to help manage pain that the pt is having °

## 2012-08-29 NOTE — Telephone Encounter (Signed)
Pt new PCP at [facility] [name] will be calling tomorrow to get information and ask questions regarding pt to help manage pain that the pt is having

## 2012-09-11 ENCOUNTER — Other Ambulatory Visit: Payer: Self-pay | Admitting: *Deleted

## 2012-09-13 ENCOUNTER — Telehealth: Payer: Self-pay | Admitting: Cardiology

## 2012-09-13 ENCOUNTER — Ambulatory Visit
Admission: RE | Admit: 2012-09-13 | Discharge: 2012-09-13 | Disposition: A | Discharge status: Home / Self Care | Payer: Medicare Other | Source: Ambulatory Visit | Attending: *Deleted | Admitting: *Deleted

## 2012-09-13 MED ORDER — IOHEXOL 300 MG/ML  SOLN
75.0000 mL | Freq: Once | INTRAMUSCULAR | Status: AC | PRN
  Administered 2012-09-13: 75 mL via INTRAVENOUS

## 2012-09-13 NOTE — Telephone Encounter (Signed)
N/A.  LMTC. 

## 2012-09-13 NOTE — Telephone Encounter (Signed)
New problem   They want to confirm if there are any intervention with pt. Pt's family is stating that they was told that there wasn't anything else that could be done. Chrissy is a meeting with family today in an hour and she need for someone to give her some in sight on this matter

## 2012-09-14 NOTE — Telephone Encounter (Signed)
Dolan Amen, NP is requesting a phone call from Tereso Newcomer to discuss this patient.  The patient's family is requesting that Chrissy and Tereso Newcomer discuss her prognosis and treatment plan.  They are requesting that she discuss this with Tereso Newcomer since he has seen her more than Dr Myrtis Ser has.

## 2012-09-14 NOTE — Telephone Encounter (Signed)
I spoke with Ms Valerie Chen. Patient has had more pain recently that has improved with the initiation of tramadol and zofran. I dicussed her case with [name].  We discussed how the patient's family has opted for medical Rx only and does not want invasive evaluation (ie cardiac cath).   We are happy to see her if she has changed her mind and would like to pursue cardiac cath. Valerie Newcomer, PA-C  2:01 PM 09/14/2012

## 2012-09-15 ENCOUNTER — Telehealth: Payer: Self-pay | Admitting: Physician Assistant

## 2012-09-15 NOTE — Telephone Encounter (Signed)
Spoke to patient's daughter Valerie Chen she wanted Valerie Newcomer PA And Dr.Katz to know she is having trouble with patient's Valerie Chen.Stated she asked Ms [name] to call Valerie Newcomer PA 3 times and she refused.Daughter stated patient shows signs of improvement.Daughter is concerned about her mother and wants to make sure she is on the right medications for her condition.States she is staying with her mother for 2 more weeks and her nausea is better but still has pressure behind her eyes,head spins,headache.States mother has appointment with eye Dr on Tuesday 09/19/12 and if her eye exam is normal she might want to try decreasing Imdur.States she will call Dr.Katz's nurse 09/19/12 and let her know.

## 2012-09-15 NOTE — Telephone Encounter (Signed)
New problem   Pt's daughter has questions about Imdur (symptoms). Please call concerning this matter.

## 2012-09-17 ENCOUNTER — Emergency Department (HOSPITAL_COMMUNITY): Payer: Medicare Other

## 2012-09-17 ENCOUNTER — Encounter (HOSPITAL_COMMUNITY): Payer: Self-pay

## 2012-09-17 ENCOUNTER — Emergency Department (HOSPITAL_COMMUNITY)
Admission: EM | Admit: 2012-09-17 | Discharge: 2012-09-17 | Disposition: A | Discharge status: Home / Self Care | Payer: Medicare Other | Attending: Emergency Medicine | Admitting: Emergency Medicine

## 2012-09-17 DIAGNOSIS — F0390 Unspecified dementia without behavioral disturbance: Secondary | ICD-10-CM | POA: Insufficient documentation

## 2012-09-17 DIAGNOSIS — E785 Hyperlipidemia, unspecified: Secondary | ICD-10-CM | POA: Insufficient documentation

## 2012-09-17 DIAGNOSIS — I951 Orthostatic hypotension: Secondary | ICD-10-CM | POA: Insufficient documentation

## 2012-09-17 DIAGNOSIS — Z7982 Long term (current) use of aspirin: Secondary | ICD-10-CM | POA: Insufficient documentation

## 2012-09-17 DIAGNOSIS — K5900 Constipation, unspecified: Secondary | ICD-10-CM

## 2012-09-17 DIAGNOSIS — Z8679 Personal history of other diseases of the circulatory system: Secondary | ICD-10-CM | POA: Insufficient documentation

## 2012-09-17 DIAGNOSIS — Z8719 Personal history of other diseases of the digestive system: Secondary | ICD-10-CM | POA: Insufficient documentation

## 2012-09-17 DIAGNOSIS — I2510 Atherosclerotic heart disease of native coronary artery without angina pectoris: Secondary | ICD-10-CM | POA: Insufficient documentation

## 2012-09-17 DIAGNOSIS — Z79899 Other long term (current) drug therapy: Secondary | ICD-10-CM | POA: Insufficient documentation

## 2012-09-17 DIAGNOSIS — Z8673 Personal history of transient ischemic attack (TIA), and cerebral infarction without residual deficits: Secondary | ICD-10-CM | POA: Insufficient documentation

## 2012-09-17 MED ORDER — FLEET ENEMA 7-19 GM/118ML RE ENEM
1.0000 | ENEMA | Freq: Once | RECTAL | Status: AC
  Administered 2012-09-17: 1 via RECTAL
  Filled 2012-09-17: qty 1

## 2012-09-17 MED ORDER — BISACODYL 10 MG RE SUPP
10.0000 mg | Freq: Once | RECTAL | Status: AC
  Administered 2012-09-17: 10 mg via RECTAL
  Filled 2012-09-17: qty 1

## 2012-09-17 MED ORDER — POLYETHYLENE GLYCOL 3350 17 GM/SCOOP PO POWD: ORAL | Status: DC

## 2012-09-17 NOTE — ED Notes (Signed)
Per EMS- Patient is a resident of [facility]. Patient has a history of alzheimer's. Paatient's daughter reported that the patient has recently been taking hydrocodone for headaches and has not had a BM for approx. 4 days. Patient's daughter reported that the patient has been c/o rectal pain, but denies at this time.

## 2012-09-17 NOTE — ED Provider Notes (Signed)
History     CSN: [id number]  Arrival date & time 09/17/12  4098   First MD Initiated Contact with Patient 09/17/12 0802      Chief Complaint  Patient presents with  . Constipation   Level V caveat for dementia  (Consider location/radiation/quality/duration/timing/severity/associated sxs/prior treatment) HPI Patient presents EMS from her assisted living facility for constipation. Evidently she's been given narcotics the last couple days for complaints of headache and has not had a bowel movement for 4 days. Patient states "I'm doing well for an 82+ person". Patient denies constipation, patient denies any pain. She states she has a normal appetite. She states she is "Waiting to find out why I'm here". Pt comments her daughter probably made her come in "She's like that".   PCP Dr Lemmie Evens (signed her DNR)  Do Not Resuscitate   Past Medical History  Diagnosis Date  . Chest pain     Hospital February, 2012, nuclear, possible anteroseptal and inferoseptal ischemia, technically difficult, medical therapy  . Dyslipidemia   . Orthostatic hypotension   . Migraines     25 year  . Dementia     Significant  . Ejection fraction     EF 55%, echo, February, 2012  //   EF 55-60%, echo, February 25, 2012  . Diastolic dysfunction     Echo, February, 2012  . TIA (transient ischemic attack)        ???question of a TIA in February, 2012, carotid Doppler, August 11, 2010 no significant abnormalities  . GI bleed     February, 2012 incomplete colonoscopy, hemorrhoids, needs complete colonoscopy  . CAD (coronary artery disease)     Probable CAD, nuclear February, 2012, possible anteroseptal and inferoseptal ischemia but the study was technically difficult  . Hyperlipidemia   . Edema     hospitalizations September, 2013, some fluid overload    Past Surgical History  Procedure Laterality Date  . Abdominal hysterectomy    . Diagnostic mammogram  12/29/2000    Mammogram, suspicious  lesions -BX pending    Family History  Problem Relation Age of Onset  . Heart attack Father     several heart attacks  . Hypertension Daughter     History  Substance Use Topics  . Smoking status: Never Smoker   . Smokeless tobacco: Never Used  . Alcohol Use: No   lives in [facility]  [place] History   Grav Para Term Preterm Abortions TAB SAB Ect Mult Living                  Review of Systems  Unable to perform ROS: Dementia    Allergies  Review of patient's allergies indicates no known allergies.  Home Medications   Current Outpatient Rx  Name  Route  Sig  Dispense  Refill  . amLODipine (NORVASC) 5 MG tablet   Oral   Take 5 mg by mouth every morning.         Marland Kitchen aspirin EC 81 MG tablet   Oral   Take 81 mg by mouth every morning.         . cyanocobalamin 1000 MCG tablet   Oral   Take 100 mcg by mouth every morning.         . diphenhydrAMINE (BENADRYL) 25 mg capsule   Oral   Take 25 mg by mouth at bedtime as needed for itching or sleep.         . furosemide (LASIX) 40 MG  tablet   Oral   Take 40 mg by mouth every morning.         . isosorbide mononitrate (IMDUR) 60 MG 24 hr tablet   Oral   Take 60 mg by mouth 2 (two) times daily.         Marland Kitchen LORazepam (ATIVAN) 0.5 MG tablet   Oral   Take 0.25 mg by mouth 2 (two) times daily. Takes 1/2 tablet twice daily         . mirtazapine (REMERON) 15 MG tablet   Oral   Take 15 mg by mouth at bedtime.         . ondansetron (ZOFRAN) 4 MG tablet   Oral   Take 4 mg by mouth every 8 (eight) hours. Scheduled-7am,3pm,9pm         . pantoprazole (PROTONIX) 40 MG tablet   Oral   Take 40 mg by mouth 2 (two) times daily.          . polyethylene glycol (MIRALAX / GLYCOLAX) packet   Oral   Take 17 g by mouth daily.         . potassium chloride SA (K-DUR,KLOR-CON) 20 MEQ tablet   Oral   Take 1 tablet (20 mEq total) by mouth 2 (two) times daily.   60 tablet   6   . pravastatin  (PRAVACHOL) 10 MG tablet   Oral   Take 10 mg by mouth at bedtime.         Marland Kitchen QUEtiapine (SEROQUEL) 25 MG tablet   Oral   Take 12.5 mg by mouth at bedtime. Takes 1/2 tablet         . sertraline (ZOLOFT) 25 MG tablet   Oral   Take 25 mg by mouth daily after breakfast.         . traMADol (ULTRAM) 50 MG tablet   Oral   Take 100 mg by mouth every 8 (eight) hours as needed. For mild to moderate pain         . HYDROcodone-acetaminophen (NORCO/VICODIN) 5-325 MG per tablet   Oral   Take 1 tablet by mouth every 6 (six) hours as needed. For severe pain         . hyoscyamine (LEVSIN, ANASPAZ) 0.125 MG tablet   Oral   Take 0.125 mg by mouth every 6 (six) hours as needed. For abdominal pain         . meclizine (ANTIVERT) 12.5 MG tablet   Oral   Take 25 mg by mouth 2 (two) times daily as needed. For dizziness         . nitroGLYCERIN (NITROSTAT) 0.4 MG SL tablet   Sublingual   Place 0.4 mg under the tongue every 5 (five) minutes as needed. For chest pain  - up to 3 doses - if no relief call 911         . promethazine (PHENERGAN) 12.5 MG tablet   Oral   Take 12.5 mg by mouth every 6 (six) hours as needed. For nausea           BP 130/55  Pulse 71  Temp(Src) 98.6 F (37 C)  Resp 16  SpO2 97%  Vital signs normal    Physical Exam  Nursing note and vitals reviewed. Constitutional: She appears well-developed and well-nourished.  Non-toxic appearance. She does not appear ill. No distress.  HENT:  Head: Normocephalic and atraumatic.  Right Ear: External ear normal.  Left Ear: External ear normal.  Nose: Nose normal. No  mucosal edema or rhinorrhea.  Mouth/Throat: Oropharynx is clear and moist and mucous membranes are normal. No dental abscesses or edematous.  Eyes: Conjunctivae and EOM are normal. Pupils are equal, round, and reactive to light.  Neck: Normal range of motion and full passive range of motion without pain. Neck supple.  Cardiovascular: Normal rate,  regular rhythm and normal heart sounds.  Exam reveals no gallop and no friction rub.   No murmur heard. Pulmonary/Chest: Effort normal and breath sounds normal. No respiratory distress. She has no wheezes. She has no rhonchi. She has no rales. She exhibits no tenderness and no crepitus.  Abdominal: Soft. Normal appearance and bowel sounds are normal. She exhibits distension. There is no tenderness. There is no rebound and no guarding.  Mildly distended without pain to palpation  Musculoskeletal: Normal range of motion. She exhibits no edema and no tenderness.  Moves all extremities well.   Neurological: She is alert. She has normal strength. No cranial nerve deficit.  Patient is alert and cooperative, she is polite saying "thank you".  Skin: Skin is warm, dry and intact. No rash noted. No erythema. No pallor.  Psychiatric: She has a normal mood and affect. Her speech is normal and behavior is normal. Her mood appears not anxious.    ED Course  Procedures (including critical care time)  , Medications  bisacodyl (DULCOLAX) suppository 10 mg (10 mg Rectal Given 09/17/12 1006)  sodium phosphate (FLEET) 7-19 GM/118ML enema 1 enema (1 enema Rectal Given 09/17/12 0910)     Daughter here, states patient is in pain. Pt denies any pain at this time.  Daughter states patient has dementia which we acknowledged.  Daughter also upset about "the bumpy EMS ride".  Telling nurses she wants the name of the EMS supervisor.   I reviewed her xrays and she has a lot of stool in the lower colon.   PT given enema and dulcolax suppository with good results of some hard stool. Pt and daughters states she is feeling better as regards to that problem. They are going to drive patient back to her facility.    Dg Abd Acute W/chest  09/17/2012  *RADIOLOGY REPORT*  Clinical Data: Nausea, constipation  ACUTE ABDOMEN SERIES (ABDOMEN 2 VIEW & CHEST 1 VIEW)  Comparison: 05/04/2012  Findings: Normal heart size and  vascularity.  Prominent overlying costal cartilage calcifications.  No focal pneumonia, collapse, consolidation, edema, effusion or pneumothorax.  Trachea midline. No free air.  Scattered air and stool throughout the bowel.  Minor epigastric small bowel dilatation but no definite obstruction or ileus appreciated.  Degenerative changes of the spine and pelvis. Bones appear osteopenic.  IMPRESSION: No acute finding by plain radiography.   Original Report Authenticated By: Judie Petit. Shick, M.D.      1. Constipation     New Prescriptions   POLYETHYLENE GLYCOL POWDER (GLYCOLAX/MIRALAX) POWDER    Get miralax and put one dose or 17 g in 8 ounces of water,  take 1 dose every 30 minutes for 2 hours or until you  get good results and then once or twice daily to prevent constipation.     Plan discharge  Devoria Albe, MD, FACEP   MDM          Ward Givens, MD 09/17/12 [id number]

## 2012-09-17 NOTE — ED Notes (Signed)
[id number]<JY> Expected date:<BR> Expected time:<BR> Means of arrival:<BR> Comments:<BR> 79 y/o female fecal impaction

## 2012-09-18 NOTE — Telephone Encounter (Signed)
Valerie Chen's daughter wants Dr Myrtis Ser and Tereso Newcomer to know that she is having days of not being able to get out of bed, sob and pain x 2 weeks.  They are unable to decide where her pain is coming from.  She has an appt with her eye doctor.  Regime was changed to Tramadol 2 in the am and 1 in the pm plus zofran which is helping her feel better. She wants to know if there is anything that she can take instead of the imdur that won't make her head spin?  Will forward to Dr Myrtis Ser and Tereso Newcomer for their information and to find out if there is a replacement for the imdur?

## 2012-09-18 NOTE — Telephone Encounter (Signed)
Okay to hold Imdur to see if it helps with her headacand head spinning

## 2012-09-20 NOTE — Telephone Encounter (Signed)
Follow-up:    Called in retuning your call.  Please call back.

## 2012-09-20 NOTE — Telephone Encounter (Signed)
Valerie Chen was notified.

## 2012-09-20 NOTE — Telephone Encounter (Signed)
Nursing home was also notified.

## 2012-10-06 ENCOUNTER — Encounter: Payer: Self-pay | Admitting: Cardiology

## 2012-10-31 ENCOUNTER — Telehealth: Payer: Self-pay | Admitting: Cardiology

## 2012-10-31 NOTE — Telephone Encounter (Signed)
In review - Dr Myrtis Ser has documented previously that pt can decrease and stop Imdur.

## 2012-10-31 NOTE — Telephone Encounter (Signed)
New problem     Need to discuss Imdur can it be cut back from  60 mg  To  30 mg .    C/O head and neck pain.

## 2012-10-31 NOTE — Telephone Encounter (Signed)
Left a message to call back.

## 2012-10-31 NOTE — Telephone Encounter (Signed)
New problem. 

## 2012-10-31 NOTE — Telephone Encounter (Signed)
Order will be faxed at family's request to decrease Imdur to 30 mg BID (60 mg 1/2 tablet twice a day) d/t head heaviness and may increase back to 60 mg BID for re-current chest pain.  Fax number [phone].  Daughter does request Dr Myrtis Ser give a referral to Dr Koren Bound in [facility] [phone]  [facility]) for a neuro consult for the evaluation of head pain.  Advised her I will forward this request to him for review.  She requests he know they have waited 7 weeks for an evaluation of this.

## 2012-11-02 NOTE — Telephone Encounter (Signed)
Please send a referral to the neurologist listed in this note as soon as possible. Please try to help every way possible to help move along her neurology evaluation

## 2012-11-03 NOTE — Telephone Encounter (Signed)
**Note De-Identified  Obfuscation** Referral sent to [facility] to schedule pts appt and to contact pt.

## 2012-11-07 ENCOUNTER — Other Ambulatory Visit: Payer: Self-pay

## 2013-01-02 ENCOUNTER — Encounter: Payer: Self-pay | Admitting: Physician Assistant

## 2013-01-02 ENCOUNTER — Ambulatory Visit (INDEPENDENT_AMBULATORY_CARE_PROVIDER_SITE_OTHER): Payer: Medicare Other | Admitting: Physician Assistant

## 2013-01-02 VITALS — BP 110/70 | HR 71 | Ht 64.0 in | Wt 181.0 lb

## 2013-01-02 DIAGNOSIS — R51 Headache: Secondary | ICD-10-CM

## 2013-01-02 DIAGNOSIS — R600 Localized edema: Secondary | ICD-10-CM

## 2013-01-02 DIAGNOSIS — R609 Edema, unspecified: Secondary | ICD-10-CM

## 2013-01-02 DIAGNOSIS — R079 Chest pain, unspecified: Secondary | ICD-10-CM

## 2013-01-02 DIAGNOSIS — I10 Essential (primary) hypertension: Secondary | ICD-10-CM

## 2013-01-02 DIAGNOSIS — I2510 Atherosclerotic heart disease of native coronary artery without angina pectoris: Secondary | ICD-10-CM

## 2013-01-02 MED ORDER — METOPROLOL TARTRATE 25 MG PO TABS: 25.0000 mg | ORAL_TABLET | Freq: Two times a day (BID) | ORAL | Status: DC

## 2013-01-02 MED ORDER — ISOSORBIDE MONONITRATE ER 30 MG PO TB24: ORAL_TABLET | ORAL | Status: DC

## 2013-01-02 NOTE — Patient Instructions (Addendum)
Decrease imdur(isosorbide) to 15mg  daily. This will be 1/2 of a 30mg  tablet daily.  Start metoprolol 25mg  two times a day.   Your physician recommends that you schedule a follow-up appointment in: 4 weeks with Dr Myrtis Ser in the [facility].

## 2013-01-02 NOTE — Progress Notes (Signed)
[address] [place], [place]  [address] Phone: [phone] Fax:  [phone]  Date:  01/02/2013   ID:  Valerie Chen, DOB Sep 03, 1928, MRN [id number]  PCP:  Valerie Peri, MD  Cardiologist:  Dr. Zackery Chen     History of Present Illness: Valerie Chen is a 79 y.o. female who returns for f/u.  She was initially seen by Dr. Myrtis Chen in 08/2010 at [facility] with abdominal and chest pain. She ruled out for myocardial infarction. Nuclear scan raised the possibility of anteroseptal and inferior septal ischemia. She had normal wall motion and normal LVF on echo. It is presumed that she has CAD. She did have a GI bleed complicating her hospitalization. She has dementia. She has been treated medically over time.   Dr. Myrtis Chen saw her 02/2012. She had been admitted to the hospital for LE edema. CT was negative for pulmonary embolism. Venous Dopplers were negative for DVT. Echo 02/25/12: Mild LVH, EF 55-60%, grade 1 diastolic dysfunction, trivial AI, mild MR. Lasix was adjusted.  Last seen here by me in 04/2012.  Since last seen, her family took her out of her ALF.  They were not happy with the care there.  She has established with a neurologist in [place] (Dr. Curt Chen).  She has been seen for HAs.  MRI was apparently ok.  She is now on Effexor for HA prophylaxis.  She continues to have CP.  This seems to be getting worse.  Her daughter helps with the hx due to significant dementia.  CP is assoc with SOB, diaphoresis.  She has it with activity.  She has assoc L arm tingling.  She has CP sometimes with bending over as well.  Her daughter is concerned that the Isosorbide is causing more HAs.  The dose had been cut back some recently with improved amount of HAs.    Labs (10/13): K 3.8, creatinine 1  Labs (11/13): K 3.9, creatinine 0.76, ALT 18   Wt Readings from Last 3 Encounters:  01/02/13 181 lb (82.101 kg)  05/11/12 180 lb 1.9 oz (81.702 kg)  04/05/12 183 lb (83.008 kg)      Past Medical History  Diagnosis Date  . Chest pain     Hospital February, 2012, nuclear, possible anteroseptal and inferoseptal ischemia, technically difficult, medical therapy  . Dyslipidemia   . Orthostatic hypotension   . Migraines     25 year  . Dementia     Significant  . Ejection fraction     EF 55%, echo, February, 2012  //   EF 55-60%, echo, February 25, 2012  . Diastolic dysfunction     Echo, February, 2012  . TIA (transient ischemic attack)        ???question of a TIA in February, 2012, carotid Doppler, August 11, 2010 no significant abnormalities  . GI bleed     February, 2012 incomplete colonoscopy, hemorrhoids, needs complete colonoscopy  . CAD (coronary artery disease)     Probable CAD, nuclear February, 2012, possible anteroseptal and inferoseptal ischemia but the study was technically difficult  . Hyperlipidemia   . Edema     hospitalizations September, 2013, some fluid overload    Current Outpatient Prescriptions  Medication Sig Dispense Refill  . amLODipine (NORVASC) 5 MG tablet Take 5 mg by mouth every morning.      Marland Kitchen aspirin EC 81 MG tablet Take 81 mg by mouth every morning.      . cyanocobalamin 1000 MCG tablet Take 500  mcg by mouth every morning.       . furosemide (LASIX) 40 MG tablet Take 40 mg by mouth every morning.      Marland Kitchen HYDROcodone-acetaminophen (NORCO/VICODIN) 5-325 MG per tablet Take 1 tablet by mouth every 6 (six) hours as needed. For severe pain      . isosorbide mononitrate (IMDUR) 60 MG 24 hr tablet Take 30 mg by mouth 2 (two) times daily.       . meclizine (ANTIVERT) 12.5 MG tablet Take 25 mg by mouth 2 (two) times daily as needed. For dizziness      . mirtazapine (REMERON) 15 MG tablet Take 15 mg by mouth at bedtime.      . ondansetron (ZOFRAN) 4 MG tablet Take 4 mg by mouth every 8 (eight) hours. Scheduled-7am,3pm,9pm      . pantoprazole (PROTONIX) 40 MG tablet Take 40 mg by mouth 2 (two) times daily.       . polyethylene glycol powder  (GLYCOLAX/MIRALAX) powder Get miralax and put one dose or 17 g in 8 ounces of water,  take 1 dose every 30 minutes for 2 hours or until you  get good results and then once or twice daily to prevent constipation.  255 g  0  . potassium chloride SA (K-DUR,KLOR-CON) 20 MEQ tablet Take 1 tablet (20 mEq total) by mouth 2 (two) times daily.  60 tablet  6  . pravastatin (PRAVACHOL) 10 MG tablet Take 10 mg by mouth at bedtime.      Marland Kitchen QUEtiapine (SEROQUEL) 25 MG tablet Take 12.5 mg by mouth at bedtime. Takes 1/2 tablet      . traMADol (ULTRAM) 50 MG tablet Take 100 mg by mouth every 8 (eight) hours as needed. For mild to moderate pain      . venlafaxine (EFFEXOR) 75 MG tablet Take 75 mg by mouth daily.       No current facility-administered medications for this visit.    Allergies:   No Known Allergies  Social History:  The patient  reports that she has never smoked. She has never used smokeless tobacco. She reports that she does not drink alcohol or use illicit drugs.   ROS:  Please see the history of present illness.      All other systems reviewed and negative.   PHYSICAL EXAM: VS:  BP 110/70  Pulse 71  Ht 5\' 4"  (1.626 m)  Wt 181 lb (82.101 kg)  BMI 31.05 kg/m2 Well nourished, well developed, in no acute distress HEENT: normal Neck: no JVD Cardiac:  normal S1, S2; RRR; no murmur Lungs:  clear to auscultation bilaterally, no wheezing, rhonchi or rales Abd: soft, nontender, no hepatomegaly Ext: trace bilat LE edema Skin: warm and dry Neuro:  CNs 2-12 intact, no focal abnormalities noted  EKG:  NSR, HR 71, no ST changes     ASSESSMENT AND PLAN:  1. Chest Pain:  It has presumed that she has CAD.  I reviewed this again with the patient and her daughter.  We discussed the pros and cons with proceeding with cardiac cath.  They are in agreement again today that the patient does not want to proceed with cath.  Will advance medical Rx.  Given her HAs, reduce Imdur to 15 mg QD.  Add Metoprolol  Tartrate 25 mg bid.  Continue ASA and Amlodipine.  Could consider Ranolazine if symptoms continue to progress.   2. Presumed CAD:  Continue ASA and statin.  3. Migraine HAs:  Beta Blocker Rx  may be beneficial in prophylaxis for HAs as well.   4. Edema:  Controlled.  Continue current Rx.  5. Hypertension:  Controlled.  Continue current therapy.   6. Disposition:  F/u with Dr. Zackery Chen in [place] in 4 weeks.   Signed, Tereso Newcomer, PA-C  01/02/2013 2:43 PM

## 2013-02-01 ENCOUNTER — Telehealth: Payer: Self-pay | Admitting: Physician Assistant

## 2013-02-01 NOTE — Telephone Encounter (Signed)
New Problem  -update Medication (beta blocker)  Is working very well // headaches almost gone.Marland Kitchen

## 2013-02-04 NOTE — Telephone Encounter (Signed)
Great! Tereso Newcomer, PA-C   02/04/2013 6:23 AM

## 2013-02-07 ENCOUNTER — Ambulatory Visit: Payer: Medicare Other | Admitting: Physician Assistant

## 2013-02-28 ENCOUNTER — Ambulatory Visit (INDEPENDENT_AMBULATORY_CARE_PROVIDER_SITE_OTHER): Payer: Medicare Other | Admitting: Physician Assistant

## 2013-02-28 ENCOUNTER — Encounter: Payer: Self-pay | Admitting: Physician Assistant

## 2013-02-28 VITALS — BP 107/60 | HR 48 | Ht 63.5 in | Wt 178.0 lb

## 2013-02-28 DIAGNOSIS — I2510 Atherosclerotic heart disease of native coronary artery without angina pectoris: Secondary | ICD-10-CM

## 2013-02-28 DIAGNOSIS — R609 Edema, unspecified: Secondary | ICD-10-CM

## 2013-02-28 DIAGNOSIS — R079 Chest pain, unspecified: Secondary | ICD-10-CM

## 2013-02-28 DIAGNOSIS — Z8669 Personal history of other diseases of the nervous system and sense organs: Secondary | ICD-10-CM

## 2013-02-28 DIAGNOSIS — I498 Other specified cardiac arrhythmias: Secondary | ICD-10-CM

## 2013-02-28 DIAGNOSIS — I10 Essential (primary) hypertension: Secondary | ICD-10-CM

## 2013-02-28 DIAGNOSIS — R001 Bradycardia, unspecified: Secondary | ICD-10-CM

## 2013-02-28 MED ORDER — METOPROLOL TARTRATE 12.5 MG HALF TABLET: 12.5000 mg | ORAL_TABLET | Freq: Two times a day (BID) | ORAL | Status: DC

## 2013-02-28 MED ORDER — ONDANSETRON HCL 4 MG PO TABS: 4.0000 mg | ORAL_TABLET | Freq: Three times a day (TID) | ORAL | Status: DC | PRN

## 2013-02-28 NOTE — Patient Instructions (Addendum)
Your physician recommends that you schedule a follow-up appointment in: WITH DR. KATZ OR SCOTT WEAVER-PA IN 3 MONTHS   Your physician has recommended you make the following change in your medication:   DECREASE YOUR METOPROLOL 12.5 MG TWICE A DAY DECREASE YOUR ZOFRAN THREE TIMES A DAY AS NEEDED FOR NAUSEA

## 2013-02-28 NOTE — Progress Notes (Signed)
[address] [place], [place]  [address] Phone: [phone] Fax:  [phone]  Date:  02/28/2013   ID:  Valerie Chen, DOB April 11, 1929, MRN [id number]  PCP:  Kirstie Peri, MD  Cardiologist:  Dr. Zackery Barefoot     History of Present Illness: Valerie Chen is a 79 y.o. female who returns for f/u.  She was initially seen by Dr. Myrtis Ser in 08/2010 at [facility] with abdominal and chest pain. She ruled out for myocardial infarction. Nuclear scan raised the possibility of anteroseptal and inferior septal ischemia. She had normal wall motion and normal LVF on echo. It is presumed that she has CAD. She did have a GI bleed complicating her hospitalization. She has dementia. She has been treated medically over time.   Dr. Myrtis Ser saw her 02/2012. She had been admitted to the hospital for LE edema. CT was negative for pulmonary embolism. Venous Dopplers were negative for DVT. Echo 02/25/12: Mild LVH, EF 55-60%, grade 1 diastolic dysfunction, trivial AI, mild MR. Lasix was adjusted.    I saw her 01/02/13. She had been moved home from the assisted living facility. She was also established with a neurologist in [facility] and diagnosed with cluster migraines. She's had multiple medication changes to help control her headaches. She continued to have episodes that sound consistent with angina. I reduced her isosorbide due to her headaches and added metoprolol 25 mg twice a day. Since starting the beta blocker, her headaches have been much improved. Her chest pain had been improved up until the past week. She had 3 episodes of severe chest discomfort which radiate to her neck and head. The family was concerned that she would pass away soon. She has repeatedly expressed that she did not want to go to hospital or have invasive evaluation. She has taken hydrocodone for pain. Nitroglycerin typically causes severe migraines. She has chronic dyspnea without change. She denies orthopnea, PND. LE edema is  unchanged. She denies syncope.   Labs (10/13): K 3.8, creatinine 1  Labs (11/13): K 3.9, creatinine 0.76, ALT 18   Wt Readings from Last 3 Encounters:  02/28/13 178 lb (80.74 kg)  01/02/13 181 lb (82.101 kg)  05/11/12 180 lb 1.9 oz (81.702 kg)     Past Medical History  Diagnosis Date  . Chest pain     Hospital February, 2012, nuclear, possible anteroseptal and inferoseptal ischemia, technically difficult, medical therapy  . Dyslipidemia   . Orthostatic hypotension   . Migraines     25 year  . Dementia     Significant  . Ejection fraction     EF 55%, echo, February, 2012  //   EF 55-60%, echo, February 25, 2012  . Diastolic dysfunction     Echo, February, 2012  . TIA (transient ischemic attack)        ???question of a TIA in February, 2012, carotid Doppler, August 11, 2010 no significant abnormalities  . GI bleed     February, 2012 incomplete colonoscopy, hemorrhoids, needs complete colonoscopy  . CAD (coronary artery disease)     Probable CAD, nuclear February, 2012, possible anteroseptal and inferoseptal ischemia but the study was technically difficult  . Hyperlipidemia   . Edema     hospitalizations September, 2013, some fluid overload    Current Outpatient Prescriptions  Medication Sig Dispense Refill  . amLODipine (NORVASC) 5 MG tablet Take 5 mg by mouth every morning.      Marland Kitchen aspirin EC 81 MG tablet Take 81  mg by mouth every morning.      . cyanocobalamin 1000 MCG tablet Take 500 mcg by mouth every morning.       . cyanocobalamin 500 MCG tablet Take 500 mcg by mouth daily.      . furosemide (LASIX) 40 MG tablet Take 40 mg by mouth every morning.      Marland Kitchen HYDROcodone-acetaminophen (NORCO/VICODIN) 5-325 MG per tablet Take 1 tablet by mouth every 6 (six) hours as needed. For severe pain      . isosorbide mononitrate (IMDUR) 30 MG 24 hr tablet 1/2 tablet (total 15mg ) daily  15 tablet  3  . Melatonin 5 MG TABS Take by mouth. One tab at bedtime prn      . metoprolol  tartrate (LOPRESSOR) 12.5 mg TABS tablet Take 0.5 tablets (12.5 mg total) by mouth 2 (two) times daily.  60 tablet  3  . mirtazapine (REMERON) 15 MG tablet Take 15 mg by mouth at bedtime.      . ondansetron (ZOFRAN) 4 MG tablet Take 1 tablet (4 mg total) by mouth 3 (three) times daily as needed for nausea.  20 tablet  0  . pantoprazole (PROTONIX) 40 MG tablet Take 40 mg by mouth daily.       . potassium chloride SA (K-DUR,KLOR-CON) 20 MEQ tablet Take 1 tablet (20 mEq total) by mouth 2 (two) times daily.  60 tablet  6  . pravastatin (PRAVACHOL) 10 MG tablet Take 10 mg by mouth at bedtime.      Marland Kitchen QUEtiapine (SEROQUEL) 25 MG tablet Take 25 mg by mouth at bedtime. Takes 1 tablet daily      . SENNOSIDES-DOCUSATE SODIUM PO Take 50 mg by mouth 2 (two) times daily. SENNOSIDES 8.6 MG      . tiZANidine (ZANAFLEX) 2 MG tablet Take 2 mg by mouth every 8 (eight) hours as needed. Take one - two tabs twice daily as needed      . traMADol (ULTRAM) 50 MG tablet Take 100 mg by mouth every 8 (eight) hours as needed. For mild to moderate pain       No current facility-administered medications for this visit.    Allergies:   No Known Allergies  Social History:  The patient  reports that she has never smoked. She has never used smokeless tobacco. She reports that she does not drink alcohol or use illicit drugs.   ROS:  Please see the history of present illness.  She has constipation.  She does note some fatigue.  All other systems reviewed and negative.   PHYSICAL EXAM: VS:  BP 107/60  Pulse 48  Ht 5' 3.5" (1.613 m)  Wt 178 lb (80.74 kg)  BMI 31.03 kg/m2 Well nourished, well developed, in no acute distress HEENT: normal Neck: no JVD Cardiac:  normal S1, S2; RRR; no murmur Lungs:  clear to auscultation bilaterally, no wheezing, rhonchi or rales Abd: soft, nontender, no hepatomegaly Ext: trace bilat LE edema; compression stockings in place Skin: warm and dry Neuro:  CNs 2-12 intact, no focal abnormalities  noted  EKG:  Sinus bradycardia, HR 48, no ST changes     ASSESSMENT AND PLAN:  1. Chest Pain:  Symptoms were improved on beta blocker therapy. She's had several episodes over the past week that have been severe.  Her daughter is with her today. She questions whether or not the patient would qualify for hospice especially in light of her Alzheimer's dementia. I have asked her to discuss this with her  PCP. She continues to desire conservative therapy. She still declines cardiac catheterization or hospitalization. She is bradycardic. She may be symptomatic from this. I will decrease her metoprolol to 12.5 mg twice a day. Her blood pressure will not allow further titration of her medications. Given her other psychotropic medications, she is not a candidate for Ranexa. 2. Presumed CAD:  Continue ASA, beta blocker and statin.  3. Migraine HAs:  Beta Blocker Rx has been somewhat beneficial for her headaches. She takes Zofran for nausea but questions if this is contributed to her headaches. I have advised her to change Zofran to 3 times a day when necessary instead of scheduled.   4. Edema:  Controlled.  Continue current Rx.  5. Hypertension:  Controlled.  Continue current therapy.   6. Disposition:  F/u with me or Dr. Zackery Barefoot in 3 months.   Signed, Tereso Newcomer, PA-C  02/28/2013 1:58 PM

## 2013-03-23 ENCOUNTER — Other Ambulatory Visit: Payer: Self-pay | Admitting: *Deleted

## 2013-03-23 DIAGNOSIS — R079 Chest pain, unspecified: Secondary | ICD-10-CM

## 2013-03-23 DIAGNOSIS — R001 Bradycardia, unspecified: Secondary | ICD-10-CM

## 2013-03-23 DIAGNOSIS — I2510 Atherosclerotic heart disease of native coronary artery without angina pectoris: Secondary | ICD-10-CM

## 2013-03-23 MED ORDER — METOPROLOL TARTRATE 12.5 MG HALF TABLET: 12.5000 mg | ORAL_TABLET | Freq: Two times a day (BID) | ORAL | Status: DC

## 2013-03-30 ENCOUNTER — Other Ambulatory Visit: Payer: Self-pay | Admitting: *Deleted

## 2013-03-30 DIAGNOSIS — R079 Chest pain, unspecified: Secondary | ICD-10-CM

## 2013-03-30 DIAGNOSIS — R001 Bradycardia, unspecified: Secondary | ICD-10-CM

## 2013-03-30 DIAGNOSIS — I2510 Atherosclerotic heart disease of native coronary artery without angina pectoris: Secondary | ICD-10-CM

## 2013-03-30 MED ORDER — METOPROLOL TARTRATE 12.5 MG HALF TABLET: 12.5000 mg | ORAL_TABLET | Freq: Two times a day (BID) | ORAL | Status: DC

## 2013-04-16 ENCOUNTER — Telehealth: Payer: Self-pay | Admitting: Physician Assistant

## 2013-04-16 NOTE — Telephone Encounter (Signed)
Called patient's daughter back. She states that her sister called her to inform her that the Mom had chest pain all night long and thinks she had a "heart attack". She wanted to bring her to the office to have lab work done to see if she had heart damage. States that she feels well today without complaints but has dementia issues. Advised that she needs to go to the Hospital to R/O for and MI and not just have lab work in our office. She verbalized understanding and will pass this information on to her other sister.

## 2013-04-18 ENCOUNTER — Encounter (HOSPITAL_COMMUNITY): Payer: Self-pay | Admitting: Emergency Medicine

## 2013-04-18 ENCOUNTER — Emergency Department (HOSPITAL_COMMUNITY): Payer: Medicare Other

## 2013-04-18 ENCOUNTER — Emergency Department (HOSPITAL_COMMUNITY)
Admission: EM | Admit: 2013-04-18 | Discharge: 2013-04-18 | Disposition: A | Discharge status: Home / Self Care | Payer: Medicare Other | Attending: Emergency Medicine | Admitting: Emergency Medicine

## 2013-04-18 ENCOUNTER — Telehealth: Payer: Self-pay | Admitting: Cardiology

## 2013-04-18 DIAGNOSIS — Z7982 Long term (current) use of aspirin: Secondary | ICD-10-CM | POA: Insufficient documentation

## 2013-04-18 DIAGNOSIS — I2510 Atherosclerotic heart disease of native coronary artery without angina pectoris: Secondary | ICD-10-CM | POA: Insufficient documentation

## 2013-04-18 DIAGNOSIS — Z79899 Other long term (current) drug therapy: Secondary | ICD-10-CM | POA: Insufficient documentation

## 2013-04-18 DIAGNOSIS — E785 Hyperlipidemia, unspecified: Secondary | ICD-10-CM | POA: Insufficient documentation

## 2013-04-18 DIAGNOSIS — Z8719 Personal history of other diseases of the digestive system: Secondary | ICD-10-CM | POA: Insufficient documentation

## 2013-04-18 DIAGNOSIS — Z8673 Personal history of transient ischemic attack (TIA), and cerebral infarction without residual deficits: Secondary | ICD-10-CM | POA: Insufficient documentation

## 2013-04-18 DIAGNOSIS — R079 Chest pain, unspecified: Secondary | ICD-10-CM | POA: Insufficient documentation

## 2013-04-18 DIAGNOSIS — G43909 Migraine, unspecified, not intractable, without status migrainosus: Secondary | ICD-10-CM | POA: Insufficient documentation

## 2013-04-18 DIAGNOSIS — R0602 Shortness of breath: Secondary | ICD-10-CM

## 2013-04-18 DIAGNOSIS — F0390 Unspecified dementia without behavioral disturbance: Secondary | ICD-10-CM | POA: Insufficient documentation

## 2013-04-18 DIAGNOSIS — R5381 Other malaise: Secondary | ICD-10-CM | POA: Insufficient documentation

## 2013-04-18 LAB — BASIC METABOLIC PANEL
Calcium: 9.4 mg/dL (ref 8.4–10.5)
Creatinine, Ser: 0.85 mg/dL (ref 0.50–1.10)
GFR calc non Af Amer: 62 mL/min — ABNORMAL LOW (ref >90)
Sodium: 135 mEq/L (ref 135–145)

## 2013-04-18 LAB — CBC
MCH: 30.9 pg (ref 26.0–34.0)
MCHC: 34.6 g/dL (ref 30.0–36.0)
Platelets: 190 10*3/uL (ref 150–400)

## 2013-04-18 LAB — PRO B NATRIURETIC PEPTIDE: Pro B Natriuretic peptide (BNP): 165.6 pg/mL (ref 0–450)

## 2013-04-18 LAB — POCT I-STAT TROPONIN I
Troponin i, poc: 0 ng/mL (ref 0.00–0.08)
Troponin i, poc: 0 ng/mL (ref 0.00–0.08)

## 2013-04-18 NOTE — ED Provider Notes (Signed)
CSN: [id number]     Arrival date & time 04/18/13  1555 History   First MD Initiated Contact with Patient 04/18/13 1608     Chief Complaint  Patient presents with  . Weakness  . Shortness of Breath  . Chest Pain   HPI  PT from home transported via [facility] pt c/o of weakness, shortness of breath, and chest pain for the last few days. All symptoms have resolved except weakness. Pt recently had seraquel adjusted.according to the patient's family she is a DO NOT RESUSCITATE and they do not want any cardiac intervention.  They were just like [place] to check her labs to make sure she hasn't had a heart attack.         Past Medical History  Diagnosis Date  . Chest pain     Hospital February, 2012, nuclear, possible anteroseptal and inferoseptal ischemia, technically difficult, medical therapy  . Dyslipidemia   . Orthostatic hypotension   . Migraines     27 year  . Dementia     Significant  . Ejection fraction     EF 55%, echo, February, 2012  //   EF 55-60%, echo, February 25, 2012  . Diastolic dysfunction     Echo, February, 2012  . TIA (transient ischemic attack)        ???question of a TIA in February, 2012, carotid Doppler, August 11, 2010 no significant abnormalities  . GI bleed     February, 2012 incomplete colonoscopy, hemorrhoids, needs complete colonoscopy  . CAD (coronary artery disease)     Probable CAD, nuclear February, 2012, possible anteroseptal and inferoseptal ischemia but the study was technically difficult  . Hyperlipidemia   . Edema     hospitalizations September, 2013, some fluid overload   Past Surgical History  Procedure Laterality Date  . Abdominal hysterectomy    . Diagnostic mammogram  12/29/2000    Mammogram, suspicious lesions -BX pending   Family History  Problem Relation Age of Onset  . Heart attack Father     several heart attacks  . Hypertension Daughter    History  Substance Use Topics  . Smoking status: Never Smoker   . Smokeless tobacco:  Never Used  . Alcohol Use: No   OB History   Grav Para Term Preterm Abortions TAB SAB Ect Mult Living                 Review of Systems  Unable to perform ROS: Dementia    Allergies  Review of patient's allergies indicates no known allergies.  Home Medications   Current Outpatient Rx  Name  Route  Sig  Dispense  Refill  . amLODipine (NORVASC) 5 MG tablet   Oral   Take 5 mg by mouth every morning.         Marland Kitchen aspirin EC 81 MG tablet   Oral   Take 81 mg by mouth every morning.         . cyanocobalamin 1000 MCG tablet   Oral   Take 500 mcg by mouth every morning.          . cyanocobalamin 500 MCG tablet   Oral   Take 500 mcg by mouth daily.         . furosemide (LASIX) 40 MG tablet   Oral   Take 40 mg by mouth every morning.         Marland Kitchen HYDROcodone-acetaminophen (NORCO/VICODIN) 5-325 MG per tablet   Oral   Take 1 tablet by  mouth every 6 (six) hours as needed. For severe pain         . isosorbide mononitrate (IMDUR) 30 MG 24 hr tablet      1/2 tablet (total 15mg ) daily   15 tablet   3   . Melatonin 5 MG TABS   Oral   Take by mouth. One tab at bedtime prn         . metoprolol tartrate (LOPRESSOR) 12.5 mg TABS tablet   Oral   Take 0.5 tablets (12.5 mg total) by mouth 2 (two) times daily.   60 tablet   3   . mirtazapine (REMERON) 15 MG tablet   Oral   Take 15 mg by mouth at bedtime.         . ondansetron (ZOFRAN) 4 MG tablet   Oral   Take 1 tablet (4 mg total) by mouth 3 (three) times daily as needed for nausea.   20 tablet   0   . pantoprazole (PROTONIX) 40 MG tablet   Oral   Take 40 mg by mouth daily.          . polyethylene glycol (MIRALAX / GLYCOLAX) packet   Oral   Take 17 g by mouth. Every third day.         . potassium chloride SA (K-DUR,KLOR-CON) 20 MEQ tablet   Oral   Take 1 tablet (20 mEq total) by mouth 2 (two) times daily.   60 tablet   6   . pravastatin (PRAVACHOL) 10 MG tablet   Oral   Take 10 mg by mouth at  bedtime.         Marland Kitchen QUEtiapine (SEROQUEL) 25 MG tablet   Oral   Take 25 mg by mouth 2 (two) times daily. Takes 1 tablet daily         . SENNOSIDES-DOCUSATE SODIUM PO   Oral   Take 50 mg by mouth 2 (two) times daily. SENNOSIDES 8.6 MG         . tiZANidine (ZANAFLEX) 2 MG tablet   Oral   Take 2 mg by mouth every 8 (eight) hours as needed. Take one - two tabs twice daily as needed         . traMADol (ULTRAM) 50 MG tablet   Oral   Take 100 mg by mouth every 8 (eight) hours. For mild to moderate pain          BP 135/55  Pulse 60  Temp(Src) 98.1 F (36.7 C) (Oral)  Resp 17  SpO2 96% Physical Exam  Nursing note and vitals reviewed. Constitutional: She is oriented to person, place, and time. She appears well-developed and well-nourished. No distress.  HENT:  Head: Normocephalic and atraumatic.  Eyes: Pupils are equal, round, and reactive to light.  Neck: Normal range of motion.  Cardiovascular: Normal rate and intact distal pulses.   Pulmonary/Chest: No respiratory distress. She has no wheezes. She has no rales.  Abdominal: Normal appearance. She exhibits no distension.  Musculoskeletal: Normal range of motion.  Neurological: She is alert and oriented to person, place, and time. No cranial nerve deficit.  Skin: Skin is warm and dry. No rash noted.  Psychiatric: She has a normal mood and affect.    ED Course  Procedures (including critical care time) Labs Review Labs Reviewed  BASIC METABOLIC PANEL - Abnormal; Notable for the following:    Glucose, Bld 104 (*)    GFR calc non Af Amer 62 (*)    GFR calc  Af Amer 72 (*)    All other components within normal limits  CBC  PRO B NATRIURETIC PEPTIDE  POCT I-STAT TROPONIN I  POCT I-STAT TROPONIN I   Imaging Review Dg Chest Port 1 View  04/18/2013   CLINICAL DATA:  Shortness of breath.  EXAM: PORTABLE CHEST - 1 VIEW  COMPARISON:  September 17, 2012.  FINDINGS: The heart size and mediastinal contours are within normal  limits. Both lungs are clear. The visualized skeletal structures are unremarkable.  IMPRESSION: No active disease.   Electronically Signed   By: Roque Lias M.D.   On: 04/18/2013 18:22    EKG Interpretation     Ventricular Rate:  59 PR Interval:  190 QRS Duration: 90 QT Interval:  447 QTC Calculation: 443 R Axis:   72 Text Interpretation:  Sinus rhythm Low voltage, precordial leads Baseline wander in lead(s) I aVR aVL No significant change since last tracing            MDM   1. Shortness of breath        Nelia Shi, MD 04/18/13 2005

## 2013-04-18 NOTE — ED Notes (Signed)
XR at bedside

## 2013-04-18 NOTE — ED Notes (Signed)
Bedside report received from previous RN, Brittney 

## 2013-04-18 NOTE — ED Notes (Signed)
PT from home transported via [facility] pt c/o of weakness, shortness of breath, and chest pain for the last few days. All symptoms have resolved except weakness. Pt recently had seraquel adjusted.

## 2013-04-18 NOTE — Telephone Encounter (Signed)
Valerie Chen spoke to the family 10/27 and advised pt to take her to the ED for eval

## 2013-04-18 NOTE — ED Notes (Signed)
Bed: RESA Expected date:  Expected time:  Means of arrival:  Comments: ems- elderly, SOB, nausea, med adjustment recently

## 2013-04-18 NOTE — Telephone Encounter (Signed)
New Problem  Received a message from Blain Pais on 10/27  "Thinks mom had a mild heart attack last night. Requesting labs and to speak to a nurse."   This message was routed to the wrong department// Please call back as soon as possible.

## 2013-04-26 ENCOUNTER — Other Ambulatory Visit: Payer: Self-pay

## 2013-04-27 ENCOUNTER — Other Ambulatory Visit: Payer: Self-pay | Admitting: Physician Assistant

## 2013-05-30 ENCOUNTER — Ambulatory Visit: Payer: Medicare Other | Admitting: Physician Assistant

## 2013-05-30 ENCOUNTER — Encounter: Payer: Self-pay | Admitting: Physician Assistant

## 2013-05-30 ENCOUNTER — Ambulatory Visit (INDEPENDENT_AMBULATORY_CARE_PROVIDER_SITE_OTHER): Payer: Medicare Other | Admitting: Physician Assistant

## 2013-05-30 VITALS — BP 140/70 | HR 78 | Ht 64.0 in | Wt 185.5 lb

## 2013-05-30 DIAGNOSIS — I2510 Atherosclerotic heart disease of native coronary artery without angina pectoris: Secondary | ICD-10-CM

## 2013-05-30 DIAGNOSIS — F0390 Unspecified dementia without behavioral disturbance: Secondary | ICD-10-CM

## 2013-05-30 DIAGNOSIS — I10 Essential (primary) hypertension: Secondary | ICD-10-CM

## 2013-05-30 DIAGNOSIS — R609 Edema, unspecified: Secondary | ICD-10-CM

## 2013-05-30 DIAGNOSIS — R079 Chest pain, unspecified: Secondary | ICD-10-CM

## 2013-05-30 DIAGNOSIS — G43909 Migraine, unspecified, not intractable, without status migrainosus: Secondary | ICD-10-CM

## 2013-05-30 NOTE — Progress Notes (Signed)
[address], [address] [place], [place]  [address] Phone: [phone] Fax:  [phone]  Date:  05/30/2013   ID:  Valerie Chen, DOB 30-May-1929, MRN [id number]  PCP:  Kirstie Peri, MD  Cardiologist:  Dr. Zackery Barefoot     History of Present Illness: Valerie Chen is a 79 y.o. female with a hx of migraine HAs, presumed CAD and chronic chest pain.  She was initially seen by Dr. Myrtis Ser in 08/2010 at [facility] with abdominal and chest pain. She ruled out for MI. Nuclear scan raised the possibility of anteroseptal and inferior septal ischemia. She had normal wall motion and normal LVF on echo. It was presumed that she has CAD. She did have a GI bleed complicating her hospitalization. She has dementia. Decision was made to treat medically.   She has had trouble with LE edema in the past.  Echo 02/25/12: Mild LVH, EF 55-60%, grade 1 diastolic dysfunction, trivial AI, mild MR. Lasix was adjusted.    She previously lived in assisted living but has moved home.  I saw her 01/02/13. I have seen her in the past for follow up on chest pain and adjusted her medications as able.  I have reviewed her situation with her and her daughter.  They have remained in agreement that she does not want aggressive, invasive evaluations.  She prefers conservative, medical Rx only.  I saw her last in 02/2013 and reduced her metoprolol due to bradycardia.    She returns for follow up.  Her daughter and CNA help with the hx.  She went to the ED at [facility] in October for CP and dyspnea.  ECG, CXR, CEs and BNP were normal.  Her PCP has adjusted her Seroquel to treat anxiety related to Alzheimer's.  She seems to be doing better.  She has not had further chest pain.  No significant dyspnea.  No syncope, orthopnea, PND.  Her LE edema seems to be worse.    Recent Labs: 04/18/2013: Creatinine 0.85; Hemoglobin 15.0; Potassium 4.1; Pro B Natriuretic peptide (BNP) 165.6   Wt Readings from Last 3 Encounters:  05/30/13 185 lb 8  oz (84.142 kg)  02/28/13 178 lb (80.74 kg)  01/02/13 181 lb (82.101 kg)     Past Medical History  Diagnosis Date  . Chest pain     Hospital February, 2012, nuclear, possible anteroseptal and inferoseptal ischemia, technically difficult, medical therapy  . Dyslipidemia   . Orthostatic hypotension   . Migraines     25 year  . Dementia     Significant  . Ejection fraction     EF 55%, echo, February, 2012  //   EF 55-60%, echo, February 25, 2012  . Diastolic dysfunction     Echo, February, 2012  . TIA (transient ischemic attack)        ???question of a TIA in February, 2012, carotid Doppler, August 11, 2010 no significant abnormalities  . GI bleed     February, 2012 incomplete colonoscopy, hemorrhoids, needs complete colonoscopy  . CAD (coronary artery disease)     Probable CAD, nuclear February, 2012, possible anteroseptal and inferoseptal ischemia but the study was technically difficult  . Hyperlipidemia   . Edema     hospitalizations September, 2013, some fluid overload  . DNR (do not resuscitate)     Current Outpatient Prescriptions  Medication Sig Dispense Refill  . amLODipine (NORVASC) 5 MG tablet Take 5 mg by mouth every morning.      Marland Kitchen aspirin EC  81 MG tablet Take 81 mg by mouth every morning.      . cyanocobalamin 1000 MCG tablet Take 500 mcg by mouth every morning.       . cyanocobalamin 500 MCG tablet Take 500 mcg by mouth daily.      . furosemide (LASIX) 40 MG tablet Take 40 mg by mouth every morning.      Marland Kitchen HYDROcodone-acetaminophen (NORCO/VICODIN) 5-325 MG per tablet Take 1 tablet by mouth every 6 (six) hours as needed. For severe pain      . isosorbide mononitrate (IMDUR) 30 MG 24 hr tablet TAKE 1/2 TABLET BY MOUTH DAILY CUT FOR PATIENT  15 tablet  1  . Melatonin 5 MG TABS Take by mouth. One tab at bedtime prn      . metoprolol tartrate (LOPRESSOR) 12.5 mg TABS tablet Take 0.5 tablets (12.5 mg total) by mouth 2 (two) times daily.  60 tablet  3  . mirtazapine  (REMERON) 15 MG tablet Take 15 mg by mouth at bedtime.      . ondansetron (ZOFRAN) 4 MG tablet Take 1 tablet (4 mg total) by mouth 3 (three) times daily as needed for nausea.  20 tablet  0  . pantoprazole (PROTONIX) 40 MG tablet Take 40 mg by mouth daily.       . polyethylene glycol (MIRALAX / GLYCOLAX) packet Take 17 g by mouth. Every third day.      . potassium chloride SA (K-DUR,KLOR-CON) 20 MEQ tablet Take 1 tablet (20 mEq total) by mouth 2 (two) times daily.  60 tablet  6  . pravastatin (PRAVACHOL) 10 MG tablet Take 10 mg by mouth at bedtime.      Marland Kitchen QUEtiapine (SEROQUEL) 25 MG tablet Take 25 mg by mouth 2 (two) times daily. Takes 1 tablet daily      . SENNOSIDES-DOCUSATE SODIUM PO Take 50 mg by mouth 2 (two) times daily. SENNOSIDES 8.6 MG      . tiZANidine (ZANAFLEX) 2 MG tablet Take 2 mg by mouth every 8 (eight) hours as needed. Take one - two tabs twice daily as needed      . traMADol (ULTRAM) 50 MG tablet Take 100 mg by mouth every 8 (eight) hours. For mild to moderate pain       No current facility-administered medications for this visit.    Allergies:   No Known Allergies  Social History:  The patient  reports that she has never smoked. She has never used smokeless tobacco. She reports that she does not drink alcohol or use illicit drugs.   ROS:  Please see the history of present illness.  She has trouble with agitation at night sometimes.    All other systems reviewed and negative.   PHYSICAL EXAM: VS:  BP 140/70  Pulse 78  Ht 5\' 4"  (1.626 m)  Wt 185 lb 8 oz (84.142 kg)  BMI 31.83 kg/m2 Well nourished, well developed, in no acute distress HEENT: normal Neck: no JVD Cardiac:  normal S1, S2; RRR; no murmur Lungs:  clear to auscultation bilaterally, no wheezing, rhonchi or rales Abd: soft, nontender, no hepatomegaly Ext: trace bilat LE edema; compression stockings in place Skin: warm and dry Neuro:  CNs 2-12 intact, no focal abnormalities noted  EKG:   NSR, HR 76, normal  axis     ASSESSMENT AND PLAN:  1. Chest Pain:  Stable.  Continue current Rx. 2. Presumed CAD:  Continue ASA, beta blocker and statin.  3. Migraine HAs:  Continue management  per neurology and PCP.  4. Edema:  She has noted some increased edema.  She will take an extra Lasix x 2-3 days along with an extra K+.  Continue compression stockings.   5. Hypertension:  Controlled.  Continue current therapy.   6. Alzheimer's Dementia:  F/u with PCP as directed.  7. Disposition:  F/u with me or Dr. Zackery Barefoot in 6 mos.   Signed, Tereso Newcomer, PA-C  05/30/2013 4:02 PM

## 2013-05-30 NOTE — Patient Instructions (Addendum)
INCREASE LASIX 40 MG TWICE DAILY FOR 2-3 DAYS; MAKE SURE TO TAKE EXTRA POTASSIUM WITH THE EXTRA LASIX FOR THE 2-3 DAYS; AFTER 3 DAYS RESUME CURRENT DOSE OF LASIX 40 MG DAILY  Your physician wants you to follow-up in: 6 MONTHS WITH DR. KATZ. You will receive a reminder letter in the mail two months in advance. If you don't receive a letter, please call our office to schedule the follow-up appointment.

## 2014-11-01 ENCOUNTER — Encounter (HOSPITAL_COMMUNITY): Payer: Self-pay | Admitting: Emergency Medicine

## 2014-11-01 ENCOUNTER — Emergency Department (HOSPITAL_COMMUNITY): Payer: Medicare Other

## 2014-11-01 ENCOUNTER — Observation Stay (HOSPITAL_COMMUNITY)
Admission: EM | Admit: 2014-11-01 | Discharge: 2014-11-04 | Disposition: A | Discharge status: Home / Self Care | Payer: Medicare Other | Attending: Family Medicine | Admitting: Family Medicine

## 2014-11-01 DIAGNOSIS — R600 Localized edema: Secondary | ICD-10-CM | POA: Diagnosis not present

## 2014-11-01 DIAGNOSIS — I10 Essential (primary) hypertension: Secondary | ICD-10-CM | POA: Insufficient documentation

## 2014-11-01 DIAGNOSIS — Z87440 Personal history of urinary (tract) infections: Secondary | ICD-10-CM | POA: Diagnosis not present

## 2014-11-01 DIAGNOSIS — F0280 Dementia in other diseases classified elsewhere without behavioral disturbance: Secondary | ICD-10-CM | POA: Diagnosis not present

## 2014-11-01 DIAGNOSIS — G309 Alzheimer's disease, unspecified: Secondary | ICD-10-CM | POA: Insufficient documentation

## 2014-11-01 DIAGNOSIS — Z7982 Long term (current) use of aspirin: Secondary | ICD-10-CM | POA: Diagnosis not present

## 2014-11-01 DIAGNOSIS — Z8673 Personal history of transient ischemic attack (TIA), and cerebral infarction without residual deficits: Secondary | ICD-10-CM | POA: Diagnosis not present

## 2014-11-01 DIAGNOSIS — M542 Cervicalgia: Secondary | ICD-10-CM | POA: Diagnosis present

## 2014-11-01 DIAGNOSIS — H547 Unspecified visual loss: Secondary | ICD-10-CM | POA: Insufficient documentation

## 2014-11-01 DIAGNOSIS — Z90710 Acquired absence of both cervix and uterus: Secondary | ICD-10-CM | POA: Diagnosis not present

## 2014-11-01 DIAGNOSIS — R531 Weakness: Secondary | ICD-10-CM | POA: Diagnosis not present

## 2014-11-01 DIAGNOSIS — E785 Hyperlipidemia, unspecified: Secondary | ICD-10-CM | POA: Diagnosis not present

## 2014-11-01 DIAGNOSIS — R42 Dizziness and giddiness: Secondary | ICD-10-CM

## 2014-11-01 DIAGNOSIS — M5032 Other cervical disc degeneration, mid-cervical region: Secondary | ICD-10-CM | POA: Insufficient documentation

## 2014-11-01 DIAGNOSIS — G43909 Migraine, unspecified, not intractable, without status migrainosus: Secondary | ICD-10-CM | POA: Insufficient documentation

## 2014-11-01 DIAGNOSIS — G9340 Encephalopathy, unspecified: Secondary | ICD-10-CM | POA: Insufficient documentation

## 2014-11-01 DIAGNOSIS — R29898 Other symptoms and signs involving the musculoskeletal system: Secondary | ICD-10-CM

## 2014-11-01 DIAGNOSIS — H53123 Transient visual loss, bilateral: Secondary | ICD-10-CM | POA: Diagnosis present

## 2014-11-01 DIAGNOSIS — R339 Retention of urine, unspecified: Secondary | ICD-10-CM | POA: Diagnosis not present

## 2014-11-01 DIAGNOSIS — M7989 Other specified soft tissue disorders: Secondary | ICD-10-CM

## 2014-11-01 DIAGNOSIS — I2510 Atherosclerotic heart disease of native coronary artery without angina pectoris: Secondary | ICD-10-CM | POA: Diagnosis not present

## 2014-11-01 DIAGNOSIS — R410 Disorientation, unspecified: Secondary | ICD-10-CM

## 2014-11-01 DIAGNOSIS — Z66 Do not resuscitate: Secondary | ICD-10-CM | POA: Insufficient documentation

## 2014-11-01 LAB — DIFFERENTIAL
Basophils Absolute: 0 10*3/uL (ref 0.0–0.1)
Basophils Relative: 0 % (ref 0–1)
Eosinophils Absolute: 0 10*3/uL (ref 0.0–0.7)
Eosinophils Relative: 0 % (ref 0–5)
Lymphocytes Relative: 44 % (ref 12–46)
Lymphs Abs: 2.1 10*3/uL (ref 0.7–4.0)
MONO ABS: 0.4 10*3/uL (ref 0.1–1.0)
MONOS PCT: 7 % (ref 3–12)
Neutro Abs: 2.3 10*3/uL (ref 1.7–7.7)
Neutrophils Relative %: 49 % (ref 43–77)

## 2014-11-01 LAB — COMPREHENSIVE METABOLIC PANEL
ALT: 20 U/L (ref 14–54)
AST: 21 U/L (ref 15–41)
Albumin: 4.1 g/dL (ref 3.5–5.0)
Alkaline Phosphatase: 64 U/L (ref 38–126)
Anion gap: 11 (ref 5–15)
BUN: 9 mg/dL (ref 6–20)
CHLORIDE: 101 mmol/L (ref 101–111)
CO2: 25 mmol/L (ref 22–32)
Calcium: 9.4 mg/dL (ref 8.9–10.3)
Creatinine, Ser: 0.96 mg/dL (ref 0.44–1.00)
GFR calc Af Amer: >60 mL/min (ref >60)
GFR calc non Af Amer: 53 mL/min — ABNORMAL LOW (ref >60)
Glucose, Bld: 82 mg/dL (ref 65–99)
POTASSIUM: 4.3 mmol/L (ref 3.5–5.1)
Sodium: 137 mmol/L (ref 135–145)
Total Bilirubin: 0.6 mg/dL (ref 0.3–1.2)
Total Protein: 7 g/dL (ref 6.5–8.1)

## 2014-11-01 LAB — RAPID URINE DRUG SCREEN, HOSP PERFORMED
Amphetamines: NOT DETECTED
BENZODIAZEPINES: NOT DETECTED
Barbiturates: NOT DETECTED
Cocaine: NOT DETECTED
OPIATES: NOT DETECTED
Tetrahydrocannabinol: NOT DETECTED

## 2014-11-01 LAB — CBC
HCT: 47.6 % — ABNORMAL HIGH (ref 36.0–46.0)
HCT: 47.6 % — ABNORMAL HIGH (ref 36.0–46.0)
HEMOGLOBIN: 16.7 g/dL — AB (ref 12.0–15.0)
Hemoglobin: 16.4 g/dL — ABNORMAL HIGH (ref 12.0–15.0)
MCH: 31.5 pg (ref 26.0–34.0)
MCH: 31.1 pg (ref 26.0–34.0)
MCHC: 35.1 g/dL (ref 30.0–36.0)
MCHC: 34.5 g/dL (ref 30.0–36.0)
MCV: 89.8 fL (ref 78.0–100.0)
MCV: 90.2 fL (ref 78.0–100.0)
PLATELETS: 177 10*3/uL (ref 150–400)
Platelets: 180 10*3/uL (ref 150–400)
RBC: 5.3 MIL/uL — ABNORMAL HIGH (ref 3.87–5.11)
RBC: 5.28 MIL/uL — AB (ref 3.87–5.11)
RDW: 12.6 % (ref 11.5–15.5)
RDW: 12.6 % (ref 11.5–15.5)
WBC: 4.7 10*3/uL (ref 4.0–10.5)
WBC: 6.6 10*3/uL (ref 4.0–10.5)

## 2014-11-01 LAB — URINALYSIS, ROUTINE W REFLEX MICROSCOPIC
Bilirubin Urine: NEGATIVE
Glucose, UA: NEGATIVE mg/dL
HGB URINE DIPSTICK: NEGATIVE
KETONES UR: NEGATIVE mg/dL
Nitrite: NEGATIVE
PH: 7 (ref 5.0–8.0)
Protein, ur: NEGATIVE mg/dL
Specific Gravity, Urine: 1.005 (ref 1.005–1.030)
Urobilinogen, UA: 0.2 mg/dL (ref 0.0–1.0)

## 2014-11-01 LAB — LIPID PANEL
Cholesterol: 179 mg/dL (ref 0–200)
HDL: 72 mg/dL (ref >40)
LDL Cholesterol: 88 mg/dL (ref 0–99)
TRIGLYCERIDES: 96 mg/dL (ref <150)
Total CHOL/HDL Ratio: 2.5 RATIO
VLDL: 19 mg/dL (ref 0–40)

## 2014-11-01 LAB — I-STAT CHEM 8, ED
BUN: 10 mg/dL (ref 6–20)
Calcium, Ion: 1.14 mmol/L (ref 1.13–1.30)
Chloride: 101 mmol/L (ref 101–111)
Creatinine, Ser: 1 mg/dL (ref 0.44–1.00)
Glucose, Bld: 85 mg/dL (ref 65–99)
HCT: 50 % — ABNORMAL HIGH (ref 36.0–46.0)
HEMOGLOBIN: 17 g/dL — AB (ref 12.0–15.0)
Potassium: 4.3 mmol/L (ref 3.5–5.1)
SODIUM: 138 mmol/L (ref 135–145)
TCO2: 24 mmol/L (ref 0–100)

## 2014-11-01 LAB — URINE MICROSCOPIC-ADD ON

## 2014-11-01 LAB — PROTIME-INR
INR: 0.97 (ref 0.00–1.49)
INR: 1.04 (ref 0.00–1.49)
Prothrombin Time: 13 seconds (ref 11.6–15.2)
Prothrombin Time: 13.7 seconds (ref 11.6–15.2)

## 2014-11-01 LAB — SEDIMENTATION RATE: Sed Rate: 2 mm/hr (ref 0–22)

## 2014-11-01 LAB — TROPONIN I

## 2014-11-01 LAB — CREATININE, SERUM
Creatinine, Ser: 0.81 mg/dL (ref 0.44–1.00)
GFR calc non Af Amer: >60 mL/min (ref >60)

## 2014-11-01 LAB — ETHANOL: Alcohol, Ethyl (B): <5 mg/dL (ref <5)

## 2014-11-01 LAB — I-STAT TROPONIN, ED: Troponin i, poc: 0 ng/mL (ref 0.00–0.08)

## 2014-11-01 LAB — TSH: TSH: 3.604 u[IU]/mL (ref 0.350–4.500)

## 2014-11-01 LAB — APTT: APTT: 29 s (ref 24–37)

## 2014-11-01 MED ORDER — AMLODIPINE BESYLATE 5 MG PO TABS
5.0000 mg | ORAL_TABLET | Freq: Every morning | ORAL | Status: DC
  Administered 2014-11-02 – 2014-11-04 (×3): 5 mg via ORAL
  Filled 2014-11-01 (×3): qty 1

## 2014-11-01 MED ORDER — GADOBENATE DIMEGLUMINE 529 MG/ML IV SOLN
15.0000 mL | Freq: Once | INTRAVENOUS | Status: AC | PRN
  Administered 2014-11-01: 15 mL via INTRAVENOUS

## 2014-11-01 MED ORDER — METOPROLOL TARTRATE 12.5 MG HALF TABLET
12.5000 mg | ORAL_TABLET | Freq: Two times a day (BID) | ORAL | Status: DC
  Administered 2014-11-01 – 2014-11-04 (×6): 12.5 mg via ORAL
  Filled 2014-11-01 (×6): qty 1

## 2014-11-01 MED ORDER — ISOSORBIDE MONONITRATE ER 30 MG PO TB24
15.0000 mg | ORAL_TABLET | Freq: Every day | ORAL | Status: DC
  Administered 2014-11-02 – 2014-11-04 (×3): 15 mg via ORAL
  Filled 2014-11-01 (×3): qty 1

## 2014-11-01 MED ORDER — PRAVASTATIN SODIUM 20 MG PO TABS: 20.0000 mg | ORAL_TABLET | Freq: Every day | ORAL | Status: DC

## 2014-11-01 MED ORDER — AMOXICILLIN 250 MG PO CAPS
250.0000 mg | ORAL_CAPSULE | Freq: Every day | ORAL | Status: DC
  Administered 2014-11-02 – 2014-11-04 (×3): 250 mg via ORAL
  Filled 2014-11-01 (×5): qty 1

## 2014-11-01 MED ORDER — ACETAMINOPHEN 325 MG PO TABS
650.0000 mg | ORAL_TABLET | Freq: Four times a day (QID) | ORAL | Status: DC | PRN
  Administered 2014-11-02 – 2014-11-03 (×2): 650 mg via ORAL
  Filled 2014-11-01 (×2): qty 2

## 2014-11-01 MED ORDER — SODIUM CHLORIDE 0.9 % IJ SOLN
3.0000 mL | Freq: Two times a day (BID) | INTRAMUSCULAR | Status: DC
  Administered 2014-11-01 – 2014-11-03 (×5): 3 mL via INTRAVENOUS

## 2014-11-01 MED ORDER — POTASSIUM CHLORIDE CRYS ER 20 MEQ PO TBCR
40.0000 meq | EXTENDED_RELEASE_TABLET | Freq: Every day | ORAL | Status: DC
  Administered 2014-11-02 – 2014-11-04 (×3): 40 meq via ORAL
  Filled 2014-11-01 (×3): qty 2

## 2014-11-01 MED ORDER — PRAVASTATIN SODIUM 20 MG PO TABS
20.0000 mg | ORAL_TABLET | Freq: Every day | ORAL | Status: DC
  Administered 2014-11-01 – 2014-11-03 (×3): 20 mg via ORAL
  Filled 2014-11-01 (×3): qty 1

## 2014-11-01 MED ORDER — ONDANSETRON HCL 4 MG/2ML IJ SOLN: 4.0000 mg | Freq: Four times a day (QID) | INTRAMUSCULAR | Status: DC | PRN

## 2014-11-01 MED ORDER — ACETAMINOPHEN 650 MG RE SUPP
650.0000 mg | Freq: Four times a day (QID) | RECTAL | Status: DC | PRN
  Filled 2014-11-01: qty 1

## 2014-11-01 MED ORDER — ASPIRIN EC 81 MG PO TBEC
81.0000 mg | DELAYED_RELEASE_TABLET | Freq: Every morning | ORAL | Status: DC
  Administered 2014-11-02 – 2014-11-04 (×3): 81 mg via ORAL
  Filled 2014-11-01 (×3): qty 1

## 2014-11-01 MED ORDER — ONDANSETRON HCL 4 MG PO TABS: 4.0000 mg | ORAL_TABLET | Freq: Four times a day (QID) | ORAL | Status: DC | PRN

## 2014-11-01 MED ORDER — QUETIAPINE FUMARATE 25 MG PO TABS
25.0000 mg | ORAL_TABLET | Freq: Every day | ORAL | Status: DC
  Administered 2014-11-02 – 2014-11-04 (×3): 25 mg via ORAL
  Filled 2014-11-01 (×3): qty 1

## 2014-11-01 MED ORDER — MIRTAZAPINE 15 MG PO TABS
30.0000 mg | ORAL_TABLET | Freq: Every day | ORAL | Status: DC
  Administered 2014-11-01 – 2014-11-03 (×3): 30 mg via ORAL
  Filled 2014-11-01 (×3): qty 2

## 2014-11-01 MED ORDER — HEPARIN SODIUM (PORCINE) 5000 UNIT/ML IJ SOLN
5000.0000 [IU] | Freq: Three times a day (TID) | INTRAMUSCULAR | Status: DC
  Administered 2014-11-01 – 2014-11-04 (×7): 5000 [IU] via SUBCUTANEOUS
  Filled 2014-11-01 (×7): qty 1

## 2014-11-01 MED ORDER — FUROSEMIDE 40 MG PO TABS
40.0000 mg | ORAL_TABLET | Freq: Every morning | ORAL | Status: DC
  Administered 2014-11-02 – 2014-11-04 (×3): 40 mg via ORAL
  Filled 2014-11-01 (×3): qty 1

## 2014-11-01 MED ORDER — VITAMIN B-12 100 MCG PO TABS
500.0000 ug | ORAL_TABLET | Freq: Every day | ORAL | Status: DC
  Administered 2014-11-02 – 2014-11-04 (×3): 500 ug via ORAL
  Filled 2014-11-01 (×3): qty 5

## 2014-11-01 MED ORDER — DOCUSATE SODIUM 100 MG PO CAPS
100.0000 mg | ORAL_CAPSULE | Freq: Two times a day (BID) | ORAL | Status: DC
  Administered 2014-11-01 – 2014-11-04 (×6): 100 mg via ORAL
  Filled 2014-11-01 (×6): qty 1

## 2014-11-01 MED ORDER — POLYETHYLENE GLYCOL 3350 17 G PO PACK: 17.0000 g | PACK | Freq: Every day | ORAL | Status: DC | PRN

## 2014-11-01 NOTE — ED Notes (Signed)
Per EMS, pt coming in today for sudden onset of shuffled gait and blurred vision. Pt also reported bilateral neck pain . EMS got a BP:160/120 on th RUE and 140/70on the LUE. CBG: 111. Pt has hx of alzheimers disease and is only alert to self.

## 2014-11-01 NOTE — H&P (Signed)
[facility] Admission History and Physical Service Pager: [phone]  Patient name: Valerie Chen Medical record number: [id number] Date of birth: 1929/01/14 Age: 81 y.o. Gender: female  Primary Care Provider: Monico Blitz, MD Consultants: Nerology Code Status: DNR  Chief Complaint: blurred vision, lower extremity weakness, neck pain, urinary retention  Assessment and Plan: Valerie Chen is a 81 y.o. female presenting with transient episode of b/l LE weakness,decreased visio. PMH is significant for Dementia, CAD, HTN, chronic chest pain. LE edema  Concern for stroke/TIA: Neurology was consulted in the ED with negative work up with MRI/MRA brain and MR neck. Still consider TIA given multiple risk factors. Cardiac arrythmia and subsequent hypoperfusion could also be a possibility - Admit for continued monitoring - continue telemetry - echo in AM - monitor closely - f/u TSH, A1c, lipid panel  Urinary retention: Pt has had a history of frequent UTI over the last 2 months and placed on prophylactic amoxicillin by urology. She has never experienced retention to their knowledge. Post void residual in ED 475 cc. In/out cath x1. Pt reports US bladder/kidneys this week, family told it was normal, will need to obtain records. Patient has no h/o incontinence.  - frequent UTI could be secondary to hygiene. Urinary retention could be secondary to urethritis/edema from chronic infections. However her urine has no signs of infection today and no RBC, or pain suggesting obstruction. - Patients home medications have been reviewed and nothing stands out as possible cause of retention.  - pt is being ruled out for a stroke, now with urinary retention. MRI/MRA is negative for stroke. Will need to follow closely.  - Bladder scan PRN for distention or complaints of pain/incomplete empty.  - Post void residual q shift; if >300 post void place foley cath. If requiring x3 in/out cath (had  one in ED) then place foley cath.  - urine culture ordered - BMP in am   CAD- Stable, Recently seen by cardiology 05/2014 stable at that time - Continue ASA - pravastatin - lopressor - Continue home Imdur  LE edema: Chronic history of edema, last echo in the system is 2013 with 50% EF at that time.  - Lasix 40 daily continued - Echo ordered  Dementia: Patient has had alzheimer's dementia. She lives at home with her daughter and has a CMA daily in the home. She is able to perform some of her ADL's, including toilet. She user a walker in the home for mobility. She wears glasses, but does not have them with her. Her daughter is spending the nights with her while admitted.  - continue home seroquel, mirtazapine  HTN-  Stable - Continue home  Norvasc and metorpolol  FEN/GI: Reg diet/ KVO Prophylaxis: subqheparin  Disposition: Pending clinical improvement and neurology consult.  History of Present Illness: Valerie Chen is a 81 y.o. female presenting with LE weakness, right sided neck pain and changes in vision that happened this afternoon at approximately 1:30 pm. She lives at home with her daughter and a CNA stays with her during the day while her daughter is a work. The CNA noted she suddenly grabbed her neck, then started to fall, but was guided to a chair. She then noted LE weakness but no focal weakness, slurred speech or facial droop. She seemed to [facility] slower than usual but answered questions appropriately and followed directions.  Her vision became "blurred"  and that lasted for approximately 15 minutes. Her weakness and "slow mentation" lasted for approximately 2 hours. Denies  chest pain, SOB, HA associated with the episode but does now endorse mild headache. Has had episodes of sudden neck pain treated with warm compresses that helps.  In the ED the patient also complained of urinary frequency and incomplete bladder emptying. Post void residual of 475 cc was documented and pt had  in/out cath x1 in ED to empty bladder. This is the first time to the families knowledge this has occurred (residual). She has a history of frequent UTI over the last 2 months and had been seen by [facility], which placed her on prophylactic amoxicillin daily. Patient had also had a renal US and Bladder US in the outpatient setting that was reported "normal" to the family. This is not in the e-chart, since the outside system is not on EMR. Patient had a urology appointment follow up scheduled at the end of this month.   Review Of Systems: Per HPI with the following additions otherwise12 point review of systems was performed and was unremarkable.  Patient Active Problem List   Diagnosis Date Noted  . Transient visual loss of both eyes 11/01/2014  . Essential hypertension 05/30/2013  . Ejection fraction   . Edema   . Lower extremity edema 02/25/2012  . CAD (coronary artery disease)   . Chest pain   . Orthostatic hypotension   . Migraines   . Dementia   . Diastolic dysfunction   . TIA (transient ischemic attack)   . GI bleed   . Dyslipidemia    Past Medical History: Past Medical History  Diagnosis Date  . Chest pain     Hospital February, 2012, nuclear, possible anteroseptal and inferoseptal ischemia, technically difficult, medical therapy  . Dyslipidemia   . Orthostatic hypotension   . Migraines     25 year  . Dementia     Significant  . Ejection fraction     EF 55%, echo, February, 2012  //   EF 55-60%, echo, February 25, 2012  . Diastolic dysfunction     Echo, February, 2012  . TIA (transient ischemic attack)        ???question of a TIA in February, 2012, carotid Doppler, August 11, 2010 no significant abnormalities  . GI bleed     February, 2012 incomplete colonoscopy, hemorrhoids, needs complete colonoscopy  . CAD (coronary artery disease)     Probable CAD, nuclear February, 2012, possible anteroseptal and inferoseptal ischemia but the study was technically difficult   . Hyperlipidemia   . Edema     hospitalizations September, 2013, some fluid overload  . DNR (do not resuscitate)    Past Surgical History: Past Surgical History  Procedure Laterality Date  . Abdominal hysterectomy    . Diagnostic mammogram  12/29/2000    Mammogram, suspicious lesions -BX pending   Social History: History  Substance Use Topics  . Smoking status: Never Smoker   . Smokeless tobacco: Never Used  . Alcohol Use: No   Additional social history: denies smoking, etoh, lives with daughter and had aide daily in the home. Wears glasses, but does not have them with her.  Please also refer to relevant sections of EMR.  Family History: Family History  Problem Relation Age of Onset  . Heart attack Father     several heart attacks  . Hypertension Daughter    Allergies and Medications: No Known Allergies No current facility-administered medications on file prior to encounter.   Current Outpatient Prescriptions on File Prior to Encounter  Medication Sig Dispense Refill  .  amLODipine (NORVASC) 5 MG tablet Take 5 mg by mouth every morning.    Marland Kitchen aspirin EC 81 MG tablet Take 81 mg by mouth every morning.    . cyanocobalamin 500 MCG tablet Take 500 mcg by mouth daily.    . furosemide (LASIX) 40 MG tablet Take 40 mg by mouth every morning.    . isosorbide mononitrate (IMDUR) 30 MG 24 hr tablet TAKE 1/2 TABLET BY MOUTH DAILY CUT FOR PATIENT 15 tablet 1  . metoprolol tartrate (LOPRESSOR) 12.5 mg TABS tablet Take 0.5 tablets (12.5 mg total) by mouth 2 (two) times daily. 60 tablet 3  . potassium chloride SA (K-DUR,KLOR-CON) 20 MEQ tablet Take 1 tablet (20 mEq total) by mouth 2 (two) times daily. (Patient taking differently: Take 40 mEq by mouth daily. ) 60 tablet 6  . QUEtiapine (SEROQUEL) 25 MG tablet Take 25 mg by mouth daily. Takes 1 tablet daily    . ondansetron (ZOFRAN) 4 MG tablet Take 1 tablet (4 mg total) by mouth 3 (three) times daily as needed for nausea. (Patient not  taking: Reported on 11/01/2014) 20 tablet 0  . traMADol (ULTRAM) 50 MG tablet Take 100 mg by mouth every 8 (eight) hours. For mild to moderate pain      Objective: BP 142/63 mmHg  Pulse 68  Temp(Src) 97.5 F (36.4 C) (Oral)  Resp 18  SpO2 95% Exam: General: NAD, lying in bed comfortably, cooperative with exam, smiles, answers questions appropriately Eyes: EOMI, PERRL, no conjunctivitis, icterus or drainage. No ptosis.  Neck: Supple, no LAD noted. Mid line trachea Cardiovascular: RRR, no murmur, clicks gallops or rubs appreciated Respiratory: CTAB, no wheeze, rhonchi or rales. Normal WOB Abdomen: soft, obese, non tender, non distended, no organomegally Extremities: b/l trace LE edema, no tenderness or erythema Skin: no rashes or lesion, skin intact, WWP Neuro:   Cranial Nerves II - XII - II - EOMI III, IV, VI - Extraocular movements intact. V - Facial sensation intact bilaterally. VII - Facial movement intact bilaterally. VIII - Hearing & vestibular intact bilaterally. X - Palate elevates symmetrically, no dysarthria. XI - Chin turning & shoulder shrug intact bilaterally. XII - Tongue protrusion intact.  Motor Strength - The patient's strength was 5/5 in all extremities and pronator drift was absent. Bulk was normal and fasciculations were absent.  Motor Tone - Muscle tone was assessed at the neck and appendages and was normal. Gait and Station - deferred due to safety concerns.   Labs and Imaging: CBC BMET   Recent Labs Lab 11/01/14 1434 11/01/14 1438  WBC 4.7  --   HGB 16.7* 17.0*  HCT 47.6* 50.0*  PLT 177  --     Recent Labs Lab 11/01/14 1433 11/01/14 1438  NA 137 138  K 4.3 4.3  CL 101 101  CO2 25  --   BUN 9 10  CREATININE 0.96 1.00  GLUCOSE 82 85  CALCIUM 9.4  --      Troponin (Point of Care Test)  Recent Labs  11/01/14 1436  TROPIPOC 0.00   Urinalysis    Component Value Date/Time   COLORURINE YELLOW 11/01/2014 [place]  11/01/2014 1511   LABSPEC 1.005 11/01/2014 1511   PHURINE 7.0 11/01/2014 1511   GLUCOSEU NEGATIVE 11/01/2014 [place] 11/01/2014 [place] 11/01/2014 [place] 11/01/2014 1511   PROTEINUR NEGATIVE 11/01/2014 1511   UROBILINOGEN 0.2 11/01/2014 1511   NITRITE NEGATIVE 11/01/2014 1511  LEUKOCYTESUR TRACE* 11/01/2014 1511     Drugs of Abuse     Component Value Date/Time   LABOPIA NONE DETECTED 11/01/2014 1511   COCAINSCRNUR NONE DETECTED 11/01/2014 1511   LABBENZ NONE DETECTED 11/01/2014 1511   AMPHETMU NONE DETECTED 11/01/2014 1511   THCU NONE DETECTED 11/01/2014 1511   LABBARB NONE DETECTED 11/01/2014 1511     MRI/MRA head 5/13  FINDINGS: MRI HEAD FINDINGS  No restricted diffusion, acute hemorrhage, mass lesion, hydrocephalus, or extra-axial fluid. Specifically, no posterior circulation event.  Advanced atrophy. Moderate periventricular and subcortical white matter signal abnormality, likely chronic microvascular ischemic change. Flow voids are maintained throughout the carotid, basilar, and vertebral arteries. There are no areas of chronic hemorrhage. Pituitary, pineal, and cerebellar tonsils unremarkable. No upper cervical lesions.  Post infusion, no abnormal enhancement of the brain or meninges. Visualized calvarium, skull base, and upper cervical osseous structures unremarkable. Scalp and extracranial soft tissues, orbits, sinuses, and mastoids show no acute process.  MRA HEAD FINDINGS  The internal carotid arteries are widely patent. The basilar artery is widely patent with vertebrals codominant. No intracranial stenosis or aneurysm.  MRI NECK FINDINGS  Unremarkable transverse arch. Conventional branching of the great vessels without proximal stenosis. No carotid bifurcation disease. No evidence for cervical ICA dissection or fibromuscular dysplasia. Tortuous BILATERAL vertebral origins without ostial  disease, stenosis in the neck or vertebral artery dissection.  Ct Head: Bony calvarium appears intact. Mild diffuse cortical atrophy is noted. Mild chronic ischemic white matter disease is noted. No mass effect or midline shift is noted. Ventricular size is within normal limits. There is no evidence of mass lesion, hemorrhage or acute infarction.  IMPRESSION: Mild diffuse cortical atrophy. Mild chronic ischemic white matter disease. No acute intracranial abnormality seen. These results were called by telephone at the time of interpretation on 11/01/2014 at 2:51 pm to Dr. Leonel Ramsay, who verbally acknowledged these results.   Electronically Signed   By: Marijo Conception, M.D.   On: 11/01/2014 14:52    Veatrice Bourbon, MD 11/01/2014, 8:20 PM PGY-1, [facility] pager: [phone], text pages welcome   I have examined the patient today with the team. We have discussed the assessment and plan. The interns note above, has been altered to reflect the exam, assessment and plan. Howard Pouch, DO PGY3 [facility]

## 2014-11-01 NOTE — ED Provider Notes (Signed)
CSN: [id number]     Arrival date & time 11/01/14  1432 History   First MD Initiated Contact with Patient 11/01/14 1433     Chief Complaint  Patient presents with  . Code Stroke    An emergency department physician performed an initial assessment on this suspected stroke patient at [facility]). (Consider location/radiation/quality/duration/timing/severity/associated sxs/prior Treatment) The history is provided by a relative and a caregiver. The history is limited by the condition of the patient (Dementia).   81 year old female comes in as a code stroke. Caregiver noted that she suddenly grabbed her neck and that she was in pain and was unable to see. She was unable to stand even with assistance. This was at 1:30 PM. She has returned to her normal mental state at about 3 PM. She is normally able to converse but is only oriented to person. Of note, she has been treated for recurrent UTIs and has had problems with confusion whenever she has developed a UTI. Family also feels that her stomach is more distended than normal in spite of her having had normal bowel movements today.  Past Medical History  Diagnosis Date  . Chest pain     Hospital February, 2012, nuclear, possible anteroseptal and inferoseptal ischemia, technically difficult, medical therapy  . Dyslipidemia   . Orthostatic hypotension   . Migraines     27 year  . Dementia     Significant  . Ejection fraction     EF 55%, echo, February, 2012  //   EF 55-60%, echo, February 25, 2012  . Diastolic dysfunction     Echo, February, 2012  . TIA (transient ischemic attack)        ???question of a TIA in February, 2012, carotid Doppler, August 11, 2010 no significant abnormalities  . GI bleed     February, 2012 incomplete colonoscopy, hemorrhoids, needs complete colonoscopy  . CAD (coronary artery disease)     Probable CAD, nuclear February, 2012, possible anteroseptal and inferoseptal ischemia but the study was technically  difficult  . Hyperlipidemia   . Edema     hospitalizations September, 2013, some fluid overload  . DNR (do not resuscitate)    Past Surgical History  Procedure Laterality Date  . Abdominal hysterectomy    . Diagnostic mammogram  12/29/2000    Mammogram, suspicious lesions -BX pending   Family History  Problem Relation Age of Onset  . Heart attack Father     several heart attacks  . Hypertension Daughter    History  Substance Use Topics  . Smoking status: Never Smoker   . Smokeless tobacco: Never Used  . Alcohol Use: No   OB History    No data available     Review of Systems  Unable to perform ROS: Dementia      Allergies  Review of patient's allergies indicates no known allergies.  Home Medications   Prior to Admission medications   Medication Sig Start Date End Date Taking? Authorizing Provider  amLODipine (NORVASC) 5 MG tablet Take 5 mg by mouth every morning.    Historical Provider, MD  aspirin EC 81 MG tablet Take 81 mg by mouth every morning.    Historical Provider, MD  cyanocobalamin 500 MCG tablet Take 500 mcg by mouth daily.    Historical Provider, MD  furosemide (LASIX) 40 MG tablet Take 40 mg by mouth every morning. 04/05/12   Liliane Shi, PA-C  HYDROcodone-acetaminophen (NORCO/VICODIN) 5-325 MG per tablet Take 1 tablet by mouth every  6 (six) hours as needed. For severe pain    Historical Provider, MD  isosorbide mononitrate (IMDUR) 30 MG 24 hr tablet TAKE 1/2 TABLET BY MOUTH DAILY CUT FOR PATIENT 04/27/13   Carlena Bjornstad, MD  Melatonin 5 MG TABS Take by mouth. One tab at bedtime prn    Historical Provider, MD  metoprolol tartrate (LOPRESSOR) 12.5 mg TABS tablet Take 0.5 tablets (12.5 mg total) by mouth 2 (two) times daily. 03/30/13   Carlena Bjornstad, MD  mirtazapine (REMERON) 15 MG tablet Take 15 mg by mouth at bedtime.    Historical Provider, MD  ondansetron (ZOFRAN) 4 MG tablet Take 1 tablet (4 mg total) by mouth 3 (three) times daily as needed for  nausea. 02/28/13   Liliane Shi, PA-C  pantoprazole (PROTONIX) 40 MG tablet Take 40 mg by mouth daily.     Historical Provider, MD  polyethylene glycol (MIRALAX / GLYCOLAX) packet Take 17 g by mouth. Every third day.    Historical Provider, MD  potassium chloride SA (K-DUR,KLOR-CON) 20 MEQ tablet Take 1 tablet (20 mEq total) by mouth 2 (two) times daily. 05/10/12   Carlena Bjornstad, MD  QUEtiapine (SEROQUEL) 25 MG tablet Take 25 mg by mouth 2 (two) times daily. Takes 1 tablet daily    Historical Provider, MD  SENNOSIDES-DOCUSATE SODIUM PO Take 50 mg by mouth 2 (two) times daily. SENNOSIDES 8.6 MG    Historical Provider, MD  tiZANidine (ZANAFLEX) 2 MG tablet Take 2 mg by mouth every 8 (eight) hours as needed. Take one - two tabs twice daily as needed    Historical Provider, MD  traMADol (ULTRAM) 50 MG tablet Take 100 mg by mouth every 8 (eight) hours. For mild to moderate pain    Historical Provider, MD   BP 138/74 mmHg  Pulse 70  Temp(Src) 97.5 F (36.4 C) (Oral)  Resp 18  SpO2 97% Physical Exam  Nursing note and vitals reviewed.  81 year old female, resting comfortably and in no acute distress. Vital signs are normal. Oxygen saturation is 97%, which is normal. Head is normocephalic and atraumatic. PERRLA, EOMI. Oropharynx is clear. Fundi show no hemorrhage, exudate, or papilledema. Neck is nontender and supple without adenopathy or JVD. There are no carotid bruits. Back is nontender and there is no CVA tenderness. Lungs are clear without rales, wheezes, or rhonchi. Chest is nontender. Heart has regular rate and rhythm without murmur. Abdomen is soft, flat, nontender without masses or hepatosplenomegaly and peristalsis is normoactive. Extremities have no cyanosis or edema, full range of motion is present. Skin is warm and dry without rash. Neurologic: She is oriented to person but not place or time, speech is normal, cranial nerves are intact, there are no motor or sensory deficits.  ED  Course  Procedures (including critical care time) Labs Review Results for orders placed or performed during the hospital encounter of 11/01/14  Ethanol  Result Value Ref Range   Alcohol, Ethyl (B) <5 <5 mg/dL  Protime-INR  Result Value Ref Range   Prothrombin Time 13.0 11.6 - 15.2 seconds   INR 0.97 0.00 - 1.49  APTT  Result Value Ref Range   aPTT 29 24 - 37 seconds  CBC  Result Value Ref Range   WBC 4.7 4.0 - 10.5 K/uL   RBC 5.30 (H) 3.87 - 5.11 MIL/uL   Hemoglobin 16.7 (H) 12.0 - 15.0 g/dL   HCT 47.6 (H) 36.0 - 46.0 %   MCV 89.8 78.0 - 100.0  fL   MCH 31.5 26.0 - 34.0 pg   MCHC 35.1 30.0 - 36.0 g/dL   RDW 12.6 11.5 - 15.5 %   Platelets 177 150 - 400 K/uL  Differential  Result Value Ref Range   Neutrophils Relative % 49 43 - 77 %   Neutro Abs 2.3 1.7 - 7.7 K/uL   Lymphocytes Relative 44 12 - 46 %   Lymphs Abs 2.1 0.7 - 4.0 K/uL   Monocytes Relative 7 3 - 12 %   Monocytes Absolute 0.4 0.1 - 1.0 K/uL   Eosinophils Relative 0 0 - 5 %   Eosinophils Absolute 0.0 0.0 - 0.7 K/uL   Basophils Relative 0 0 - 1 %   Basophils Absolute 0.0 0.0 - 0.1 K/uL  Comprehensive metabolic panel  Result Value Ref Range   Sodium 137 135 - 145 mmol/L   Potassium 4.3 3.5 - 5.1 mmol/L   Chloride 101 101 - 111 mmol/L   CO2 25 22 - 32 mmol/L   Glucose, Bld 82 65 - 99 mg/dL   BUN 9 6 - 20 mg/dL   Creatinine, Ser 0.96 0.44 - 1.00 mg/dL   Calcium 9.4 8.9 - 10.3 mg/dL   Total Protein 7.0 6.5 - 8.1 g/dL   Albumin 4.1 3.5 - 5.0 g/dL   AST 21 15 - 41 U/L   ALT 20 14 - 54 U/L   Alkaline Phosphatase 64 38 - 126 U/L   Total Bilirubin 0.6 0.3 - 1.2 mg/dL   GFR calc non Af Amer 53 (L) >60 mL/min   GFR calc Af Amer >60 >60 mL/min   Anion gap 11 5 - 15  Urine Drug Screen  Result Value Ref Range   Opiates NONE DETECTED NONE DETECTED   Cocaine NONE DETECTED NONE DETECTED   Benzodiazepines NONE DETECTED NONE DETECTED   Amphetamines NONE DETECTED NONE DETECTED   Tetrahydrocannabinol NONE DETECTED NONE  DETECTED   Barbiturates NONE DETECTED NONE DETECTED  Urinalysis, Routine w reflex microscopic  Result Value Ref Range   Color, Urine YELLOW YELLOW   APPearance CLEAR CLEAR   Specific Gravity, Urine 1.005 1.005 - 1.030   pH 7.0 5.0 - 8.0   Glucose, UA NEGATIVE NEGATIVE mg/dL   Hgb urine dipstick NEGATIVE NEGATIVE   Bilirubin Urine NEGATIVE NEGATIVE   Ketones, ur NEGATIVE NEGATIVE mg/dL   Protein, ur NEGATIVE NEGATIVE mg/dL   Urobilinogen, UA 0.2 0.0 - 1.0 mg/dL   Nitrite NEGATIVE NEGATIVE   Leukocytes, UA TRACE (A) NEGATIVE  Sedimentation rate  Result Value Ref Range   Sed Rate 2 0 - 22 mm/hr  Urine microscopic-add on  Result Value Ref Range   Squamous Epithelial / LPF RARE RARE   WBC, UA 0-2 <3 WBC/hpf   RBC / HPF 0-2 <3 RBC/hpf   Bacteria, UA RARE RARE  I-Stat Chem 8, ED  (not at [facility], [facility])  Result Value Ref Range   Sodium 138 135 - 145 mmol/L   Potassium 4.3 3.5 - 5.1 mmol/L   Chloride 101 101 - 111 mmol/L   BUN 10 6 - 20 mg/dL   Creatinine, Ser 1.00 0.44 - 1.00 mg/dL   Glucose, Bld 85 65 - 99 mg/dL   Calcium, Ion 1.14 1.13 - 1.30 mmol/L   TCO2 24 0 - 100 mmol/L   Hemoglobin 17.0 (H) 12.0 - 15.0 g/dL   HCT 50.0 (H) 36.0 - 46.0 %  I-stat troponin, ED (not at [facility], [facility])  Result Value Ref Range  Troponin i, poc 0.00 0.00 - 0.08 ng/mL   Comment 3            Imaging Review Ct Head Wo Contrast  11/01/2014   CLINICAL DATA:  Aphasia.  EXAM: CT HEAD WITHOUT CONTRAST  TECHNIQUE: Contiguous axial images were obtained from the base of the skull through the vertex without intravenous contrast.  COMPARISON:  CT scan of September 13, 2012.  FINDINGS: Bony calvarium appears intact. Mild diffuse cortical atrophy is noted. Mild chronic ischemic white matter disease is noted. No mass effect or midline shift is noted. Ventricular size is within normal limits. There is no evidence of mass lesion, hemorrhage or acute infarction.  IMPRESSION: Mild diffuse cortical atrophy. Mild chronic ischemic  white matter disease. No acute intracranial abnormality seen. These results were called by telephone at the time of interpretation on 11/01/2014 at 2:51 pm to Dr. Leonel Ramsay, who verbally acknowledged these results.   Electronically Signed   By: Marijo Conception, M.D.   On: 11/01/2014 14:52   Mr Jodene Nam Head Wo Contrast  11/01/2014   CLINICAL DATA:  Elderly woman with dementia and migraines presenting with transient episode of lightheadedness. Symptoms now resolved. Neck pain.  EXAM: MRI HEAD WITHOUT AND WITH CONTRAST AND MRA HEAD WITHOUT AND WITH CONTRAST AND MRI NECK WITHOUT AND WITH CONTRAST  TECHNIQUE: Multiplanar, multiecho pulse sequences of the brain and surrounding structures were obtained without and with intravenous contrast. Angiographic images of the head were obtained using MRA technique without and with contrast. Multiplanar, multiecho pulse sequences of the neck and surrounding structures were obtained without and with intravenous contrast.  CONTRAST:  77mL MULTIHANCE GADOBENATE DIMEGLUMINE 529 MG/ML IV SOLN  COMPARISON:  CT head earlier in the day  FINDINGS: MRI HEAD FINDINGS  No restricted diffusion, acute hemorrhage, mass lesion, hydrocephalus, or extra-axial fluid. Specifically, no posterior circulation event.  Advanced atrophy. Moderate periventricular and subcortical white matter signal abnormality, likely chronic microvascular ischemic change. Flow voids are maintained throughout the carotid, basilar, and vertebral arteries. There are no areas of chronic hemorrhage. Pituitary, pineal, and cerebellar tonsils unremarkable. No upper cervical lesions.  Post infusion, no abnormal enhancement of the brain or meninges. Visualized calvarium, skull base, and upper cervical osseous structures unremarkable. Scalp and extracranial soft tissues, orbits, sinuses, and mastoids show no acute process.  MRA HEAD FINDINGS  The internal carotid arteries are widely patent. The basilar artery is widely patent with  vertebrals codominant. No intracranial stenosis or aneurysm.  MRI NECK FINDINGS  Unremarkable transverse arch. Conventional branching of the great vessels without proximal stenosis. No carotid bifurcation disease. No evidence for cervical ICA dissection or fibromuscular dysplasia. Tortuous BILATERAL vertebral origins without ostial disease, stenosis in the neck or vertebral artery dissection.  IMPRESSION: Age-related changes as described.  No acute intracranial findings.  No intracranial or extracranial flow reducing lesion or occlusion.   Electronically Signed   By: Rolla Flatten M.D.   On: 11/01/2014 19:19   Mr Angiogram Neck W Wo Contrast  11/01/2014   CLINICAL DATA:  Elderly woman with dementia and migraines presenting with transient episode of lightheadedness. Symptoms now resolved. Neck pain.  EXAM: MRI HEAD WITHOUT AND WITH CONTRAST AND MRA HEAD WITHOUT AND WITH CONTRAST AND MRI NECK WITHOUT AND WITH CONTRAST  TECHNIQUE: Multiplanar, multiecho pulse sequences of the brain and surrounding structures were obtained without and with intravenous contrast. Angiographic images of the head were obtained using MRA technique without and with contrast. Multiplanar, multiecho pulse sequences of the  neck and surrounding structures were obtained without and with intravenous contrast.  CONTRAST:  4mL MULTIHANCE GADOBENATE DIMEGLUMINE 529 MG/ML IV SOLN  COMPARISON:  CT head earlier in the day  FINDINGS: MRI HEAD FINDINGS  No restricted diffusion, acute hemorrhage, mass lesion, hydrocephalus, or extra-axial fluid. Specifically, no posterior circulation event.  Advanced atrophy. Moderate periventricular and subcortical white matter signal abnormality, likely chronic microvascular ischemic change. Flow voids are maintained throughout the carotid, basilar, and vertebral arteries. There are no areas of chronic hemorrhage. Pituitary, pineal, and cerebellar tonsils unremarkable. No upper cervical lesions.  Post infusion, no  abnormal enhancement of the brain or meninges. Visualized calvarium, skull base, and upper cervical osseous structures unremarkable. Scalp and extracranial soft tissues, orbits, sinuses, and mastoids show no acute process.  MRA HEAD FINDINGS  The internal carotid arteries are widely patent. The basilar artery is widely patent with vertebrals codominant. No intracranial stenosis or aneurysm.  MRI NECK FINDINGS  Unremarkable transverse arch. Conventional branching of the great vessels without proximal stenosis. No carotid bifurcation disease. No evidence for cervical ICA dissection or fibromuscular dysplasia. Tortuous BILATERAL vertebral origins without ostial disease, stenosis in the neck or vertebral artery dissection.  IMPRESSION: Age-related changes as described.  No acute intracranial findings.  No intracranial or extracranial flow reducing lesion or occlusion.   Electronically Signed   By: Rolla Flatten M.D.   On: 11/01/2014 19:19   Mr Jeri Cos AJ Contrast  11/01/2014   CLINICAL DATA:  Elderly woman with dementia and migraines presenting with transient episode of lightheadedness. Symptoms now resolved. Neck pain.  EXAM: MRI HEAD WITHOUT AND WITH CONTRAST AND MRA HEAD WITHOUT AND WITH CONTRAST AND MRI NECK WITHOUT AND WITH CONTRAST  TECHNIQUE: Multiplanar, multiecho pulse sequences of the brain and surrounding structures were obtained without and with intravenous contrast. Angiographic images of the head were obtained using MRA technique without and with contrast. Multiplanar, multiecho pulse sequences of the neck and surrounding structures were obtained without and with intravenous contrast.  CONTRAST:  27mL MULTIHANCE GADOBENATE DIMEGLUMINE 529 MG/ML IV SOLN  COMPARISON:  CT head earlier in the day  FINDINGS: MRI HEAD FINDINGS  No restricted diffusion, acute hemorrhage, mass lesion, hydrocephalus, or extra-axial fluid. Specifically, no posterior circulation event.  Advanced atrophy. Moderate periventricular  and subcortical white matter signal abnormality, likely chronic microvascular ischemic change. Flow voids are maintained throughout the carotid, basilar, and vertebral arteries. There are no areas of chronic hemorrhage. Pituitary, pineal, and cerebellar tonsils unremarkable. No upper cervical lesions.  Post infusion, no abnormal enhancement of the brain or meninges. Visualized calvarium, skull base, and upper cervical osseous structures unremarkable. Scalp and extracranial soft tissues, orbits, sinuses, and mastoids show no acute process.  MRA HEAD FINDINGS  The internal carotid arteries are widely patent. The basilar artery is widely patent with vertebrals codominant. No intracranial stenosis or aneurysm.  MRI NECK FINDINGS  Unremarkable transverse arch. Conventional branching of the great vessels without proximal stenosis. No carotid bifurcation disease. No evidence for cervical ICA dissection or fibromuscular dysplasia. Tortuous BILATERAL vertebral origins without ostial disease, stenosis in the neck or vertebral artery dissection.  IMPRESSION: Age-related changes as described.  No acute intracranial findings.  No intracranial or extracranial flow reducing lesion or occlusion.   Electronically Signed   By: Rolla Flatten M.D.   On: 11/01/2014 19:19   ECG Interpretation: VERLENE, GLANTZ OI:[id number] 01-Nov-2014 [facility]/ED ROUTINE RECORD Sinus rhythm Low voltage with right axis deviation 21mm/s 55mm/mV 100Hz  8.0 SP2  CID: [id number] Referred by: Unconfirmed Vent. rate 70 BPM PR interval 183 ms QRS duration 82 ms QT/QTc 403/435 ms P-R-T axes 65 60 39 1929/06/07 (86 yr) Female Caucasian Room:D30C Loc:11 Technician: [phone] Test ind: Code Stroke Compared with ECG of 05/30/2013, no significant changes are seen.  CRITICAL CARE Performed by: Delora Fuel Total critical care time: 35 minutes Critical care time was exclusive of separately billable procedures and treating other  patients. Critical care was necessary to treat or prevent imminent or life-threatening deterioration. Critical care was time spent personally by me on the following activities: development of treatment plan with patient and/or surrogate as well as nursing, discussions with consultants, evaluation of patient's response to treatment, examination of patient, obtaining history from patient or surrogate, ordering and performing treatments and interventions, ordering and review of laboratory studies, ordering and review of radiographic studies, pulse oximetry and re-evaluation of patient's condition.  MDM   Final diagnoses:  Transient visual loss of both eyes  Urinary retention    Transient vision loss with a global weakness without focal findings. Apparent neck pain is concerning for possible polymyalgia rheumatica/temporal arteritis. She will need TIA evaluation for possible amaurosis fugax. Old records are reviewed and she has no relevant past visits.  Laboratory and imaging evaluation is unremarkable. MRA of head and neck was unremarkable. Sedimentation rate is come back at 2. She has had urinary frequency in the ED and bladder imaging showed 500 mL residual in the bladder. She is straight cath to achieve bladder emptying. Family is requesting urology consultation. Case is discussed with family practice resident who agrees to admit the patient.    Delora Fuel, MD 60/04/59 [id number]

## 2014-11-01 NOTE — ED Notes (Signed)
Pt to MRI at this time.

## 2014-11-01 NOTE — Code Documentation (Signed)
81yo female arriving to [facility] at [facility] via [place].  Patient from home where her caregiver witnessed her to have episode where she had bilateral neck pain and blurred vision and had to be assisted to the chair by her caregiver.  Patient has h/o dementia and UTI.  EMS called code stroke for altered gait and blurred vision.  Stroke team at the bedside on arrival.  Labs drawn and patient cleared by Dr. Aline Brochure.  Patient to CT.  NIHSS 1, see documentation for details and code stroke times.  Dr. Leonel Ramsay at the bedside.  No acute stroke treatment at this time.  Patient remains in the window to treat with tPA until 1630 should symptoms worsen.  Bedside handoff with ED RN Rodman Key.

## 2014-11-01 NOTE — Consult Note (Signed)
Neurology Consultation Reason for Consult: Dizziness Referring Physician:   CC: Dizziness  History is obtained from:patient  HPI: Valerie Chen is a 81 y.o. female with a history of dementia and migraines who presents with a transient episode of lightheadedness. She describes feeling lightheaded, like a film came over her eyes and she had trouble walking. Seh also described a sharp pain in her neck about that time. She denies headache, but still has some neck pain.    LKW: 1:30pm tpa given?: no, mild symptoms.     ROS: A 14 point ROS was performed and is negative except as noted in the HPI.   Past Medical History  Diagnosis Date  . Chest pain     Hospital February, 2012, nuclear, possible anteroseptal and inferoseptal ischemia, technically difficult, medical therapy  . Dyslipidemia   . Orthostatic hypotension   . Migraines     25 year  . Dementia     Significant  . Ejection fraction     EF 55%, echo, February, 2012  //   EF 55-60%, echo, February 25, 2012  . Diastolic dysfunction     Echo, February, 2012  . TIA (transient ischemic attack)        ???question of a TIA in February, 2012, carotid Doppler, August 11, 2010 no significant abnormalities  . GI bleed     February, 2012 incomplete colonoscopy, hemorrhoids, needs complete colonoscopy  . CAD (coronary artery disease)     Probable CAD, nuclear February, 2012, possible anteroseptal and inferoseptal ischemia but the study was technically difficult  . Hyperlipidemia   . Edema     hospitalizations September, 2013, some fluid overload  . DNR (do not resuscitate)     Family History: HA  Social History: Tob: never smoker  Exam: Current vital signs: BP 138/74 mmHg  Pulse 70  Temp(Src) 97.5 F (36.4 C) (Oral)  Resp 18  SpO2 97% Vital signs in last 24 hours: Temp:  [97.5 F (36.4 C)] 97.5 F (36.4 C) (05/13 1454) Pulse Rate:  [70] 70 (05/13 1448) Resp:  [18] 18 (05/13 1448) BP: (138)/(74) 138/74 mmHg (05/13  1448) SpO2:  [97 %] 97 % (05/13 1448)   Physical Exam  Constitutional: Appears elderly Psych: Affect appropriate to situation Eyes: No scleral injection HENT: No OP obstrucion Head: Normocephalic.  Cardiovascular: Normal rate and regular rhythm.  Respiratory: Effort normal and breath sounds normal to anterior ascultation GI: Soft.  No distension. There is no tenderness.  Skin: WDI  Neuro: Mental Status: Patient is awake, alert, oriented to person, unable to give age btu does give month.  Patient is able to give a clear and coherent history. No signs of aphasia or neglect Cranial Nerves: II: Visual Fields are full. Pupils are equal, round, and reactive to light.   III,IV, VI: EOMI without ptosis or diploplia.  V: Facial sensation is symmetric to temperature VII: Facial movement is symmetric.  VIII: hearing is intact to voice X: Uvula elevates symmetrically XI: Shoulder shrug is symmetric. XII: tongue is midline without atrophy or fasciculations.  Motor: Tone is normal. Bulk is normal. 5/5 strength was present in all four extremities.  Sensory: Sensation is symmetric to light touch and pin in the arms and legs. Cerebellar: FNF and HKS are intact bilaterally    I have reviewed labs in epic and the results pertinent to this consultation are: Chem 8 - elevated HgB  I have reviewed the images obtained: CT head - atrophy  Impression: 81 yo F with  transient lightheadedness, blurred vision and gait difficulty. This sounds like hypoperfusion, and possibilities include orthostasis, transient arrhythmia that resolved, posterior circulation insufficiency(considering neck pain will need to rule out dissection).   Recommendations: 1) MRI brain, MRA head and neck. 2) Further recs following imaging.    Roland Rack, MD [facility] [phone]  If 7pm- 7am, please page neurology on call as listed in [place].

## 2014-11-01 NOTE — ED Notes (Signed)
Patient was c/o frequency and incomplete emptying after voiding.  Bladder scan was performed, and it was noted that patient had 475 cc of urine remaining in her bladder.  Dr. Roxanne Mins was informed and he requested an in and out cath to empty her bladder.

## 2014-11-02 ENCOUNTER — Observation Stay (HOSPITAL_COMMUNITY): Payer: Medicare Other

## 2014-11-02 DIAGNOSIS — R29898 Other symptoms and signs involving the musculoskeletal system: Secondary | ICD-10-CM

## 2014-11-02 DIAGNOSIS — F0390 Unspecified dementia without behavioral disturbance: Secondary | ICD-10-CM | POA: Diagnosis not present

## 2014-11-02 DIAGNOSIS — H547 Unspecified visual loss: Secondary | ICD-10-CM | POA: Diagnosis not present

## 2014-11-02 DIAGNOSIS — G309 Alzheimer's disease, unspecified: Secondary | ICD-10-CM | POA: Diagnosis not present

## 2014-11-02 DIAGNOSIS — F0280 Dementia in other diseases classified elsewhere without behavioral disturbance: Secondary | ICD-10-CM | POA: Diagnosis not present

## 2014-11-02 DIAGNOSIS — R339 Retention of urine, unspecified: Secondary | ICD-10-CM

## 2014-11-02 DIAGNOSIS — R410 Disorientation, unspecified: Secondary | ICD-10-CM | POA: Diagnosis not present

## 2014-11-02 DIAGNOSIS — I10 Essential (primary) hypertension: Secondary | ICD-10-CM

## 2014-11-02 DIAGNOSIS — R42 Dizziness and giddiness: Secondary | ICD-10-CM

## 2014-11-02 DIAGNOSIS — M7989 Other specified soft tissue disorders: Secondary | ICD-10-CM

## 2014-11-02 DIAGNOSIS — H53123 Transient visual loss, bilateral: Secondary | ICD-10-CM | POA: Diagnosis not present

## 2014-11-02 DIAGNOSIS — R531 Weakness: Secondary | ICD-10-CM | POA: Diagnosis not present

## 2014-11-02 LAB — CBC
HCT: 44.2 % (ref 36.0–46.0)
Hemoglobin: 15 g/dL (ref 12.0–15.0)
MCH: 30.5 pg (ref 26.0–34.0)
MCHC: 33.9 g/dL (ref 30.0–36.0)
MCV: 89.8 fL (ref 78.0–100.0)
PLATELETS: 167 10*3/uL (ref 150–400)
RBC: 4.92 MIL/uL (ref 3.87–5.11)
RDW: 12.6 % (ref 11.5–15.5)
WBC: 5.5 10*3/uL (ref 4.0–10.5)

## 2014-11-02 LAB — URINALYSIS, ROUTINE W REFLEX MICROSCOPIC
BILIRUBIN URINE: NEGATIVE
GLUCOSE, UA: NEGATIVE mg/dL
KETONES UR: NEGATIVE mg/dL
LEUKOCYTES UA: NEGATIVE
Nitrite: NEGATIVE
Protein, ur: NEGATIVE mg/dL
SPECIFIC GRAVITY, URINE: 1.009 (ref 1.005–1.030)
Urobilinogen, UA: 0.2 mg/dL (ref 0.0–1.0)
pH: 6.5 (ref 5.0–8.0)

## 2014-11-02 LAB — COMPREHENSIVE METABOLIC PANEL
ALK PHOS: 55 U/L (ref 38–126)
ALT: 18 U/L (ref 14–54)
AST: 19 U/L (ref 15–41)
Albumin: 3.4 g/dL — ABNORMAL LOW (ref 3.5–5.0)
Anion gap: 9 (ref 5–15)
BUN: 14 mg/dL (ref 6–20)
CO2: 23 mmol/L (ref 22–32)
CREATININE: 0.77 mg/dL (ref 0.44–1.00)
Calcium: 8.9 mg/dL (ref 8.9–10.3)
Chloride: 105 mmol/L (ref 101–111)
GFR calc Af Amer: >60 mL/min (ref >60)
GFR calc non Af Amer: >60 mL/min (ref >60)
Glucose, Bld: 107 mg/dL — ABNORMAL HIGH (ref 65–99)
Potassium: 3.8 mmol/L (ref 3.5–5.1)
Sodium: 137 mmol/L (ref 135–145)
Total Bilirubin: 0.6 mg/dL (ref 0.3–1.2)
Total Protein: 6.2 g/dL — ABNORMAL LOW (ref 6.5–8.1)

## 2014-11-02 LAB — TROPONIN I
Troponin I: <0.03 ng/mL (ref <0.031)
Troponin I: <0.03 ng/mL (ref <0.031)

## 2014-11-02 LAB — URINE MICROSCOPIC-ADD ON

## 2014-11-02 LAB — MAGNESIUM: Magnesium: 2.2 mg/dL (ref 1.7–2.4)

## 2014-11-02 MED ORDER — OXYCODONE-ACETAMINOPHEN 5-325 MG PO TABS: 1.0000 | ORAL_TABLET | ORAL | Status: DC | PRN

## 2014-11-02 NOTE — Progress Notes (Signed)
Family Medicine Teaching Service Daily Progress Note Intern Pager: [phone]  Patient name: Valerie Chen Medical record number: [id number] Date of birth: 09/06/1928 Age: 81 y.o. Gender: female  Primary Care Provider: Monico Blitz, MD Consultants: Neurology, consider urology Code Status: DNR (has form)  Pt Overview and Major Events to Date:  5/13: admit to telemetry unit. MRI/MRA/CT negative for acute process. 5/14: Continued urinary retention >> Foley placed  Assessment and Plan: Valerie Chen is a 81 y.o. female presenting with transient episode of b/l LE weakness,decreased visio. PMH is significant for Dementia, CAD, HTN, chronic chest pain. LE edema  Concern for stroke/TIA: Neurology was consulted in the ED with negative work up with MRI/MRA brain and MR neck. Still consider TIA given multiple risk factors. Cardiac arrythmia and subsequent hypoperfusion could also be a possibility - continue telemetry - echo in AM - monitor closely - TSH 3.604 -  A1c pending. -  lipid panel: Total179/Trig 96/H72/L88  Urinary retention: Pt has had a history of frequent UTI over the last 2 months and placed on prophylactic amoxicillin by urology. She has never experienced retention to their knowledge. Post void residual in ED 475 cc. In/out cath x1. Pt reports US bladder/kidneys this week, family told it was normal, obtained copy from daughter. Patient has no h/o incontinence.  - frequent UTI could be secondary to hygiene. Urinary retention could be secondary to urethritis/edema from chronic infections. However her urine has no signs of infection today and no RBC, or pain suggesting obstruction. - Patients home medications have been reviewed and nothing stands out as possible cause of retention.  - pt is being ruled out for a stroke, now with urinary retention. MRI/MRA is negative for stroke. Will need to follow closely.  - Post void residual q shift; if >300 post void place foley cath. If requiring x3  in/out cath (had one in ED) then place foley cath >>> pt required foley cath insert last night.  - urine culture ordered - Consider CT abd/pelvis - BMP in am   CAD- Stable, Recently seen by cardiology 05/2014 stable at that time - Continue ASA - pravastatin - lopressor - Continue home Imdur  LE edema: Chronic history of edema, last echo in the system is 2013 with 50% EF at that time.  - Lasix 40 daily continued - Echo ordered  Dementia: Patient has had alzheimer's dementia. She lives at home with her daughter and has a CMA daily in the home. She is able to perform some of her ADL's, including toilet. She uses a walker in the home for mobility. She wears glasses, but does not have them with her. Her daughter is spending the nights with her while admitted.  - continue home seroquel, mirtazapine  HTN- Stable - Continue home Norvasc and metorpolol  FEN/GI: reg diet/KVO PPx: Sibq hep  Disposition: Pending Neurology recommnedations  Subjective:  Pt sitting up in bed, alert, oriented, eating breakfast. Daughter at bedside, states her mother had a few minutes of confusion this morning, but feels it cleared quickly and she may have just not been fully awake. Pt states she is doing well this morning, but still has the sensation that she can not empty her bladder. She did have a post void residual ~350 and received a foley cath last night.   Objective: Temp:  [97.2 F (36.2 C)-99 F (37.2 C)] 98.2 F (36.8 C) (05/14 0500) Pulse Rate:  [63-73] 67 (05/14 0500) Resp:  [11-20] 14 (05/14 0500) BP: (106-144)/(42-79) 125/52 mmHg (05/14 0500)  SpO2:  [94 %-99 %] 94 % (05/14 0500) FiO2 (%):  [0 %] 0 % (05/13 2136) Weight:  [181 lb 14.4 oz (82.509 kg)] 181 lb 14.4 oz (82.509 kg) (05/13 2123)  Physical Exam: General: NAD, sitting in bed comfortably, cooperative with exam, smiles, answers questions appropriately Eyes: EOMI, PERRL, no conjunctivitis, icterus or drainage. No ptosis.  Neck:  Supple, no LAD noted. Mid line trachea Cardiovascular: RRR, no murmur, clicks gallops or rubs appreciated Respiratory: CTAB, no wheeze, rhonchi or rales. Normal WOB Abdomen: soft, obese, non tender, non distended, no organomegally Extremities: b/l trace LE edema, no tenderness or erythema Skin: no rashes or lesion, skin intact, WWP Neuro: sitting up in bed, eating. [place]  Laboratory:  Recent Labs Lab 11/01/14 1434 11/01/14 1438 11/01/14 2238 11/02/14 0417  WBC 4.7  --  6.6 5.5  HGB 16.7* 17.0* 16.4* 15.0  HCT 47.6* 50.0* 47.6* 44.2  PLT 177  --  180 167    Recent Labs Lab 11/01/14 1433 11/01/14 1438 11/01/14 2238 11/02/14 0417  NA 137 138  --  137  K 4.3 4.3  --  3.8  CL 101 101  --  105  CO2 25  --   --  23  BUN 9 10  --  14  CREATININE 0.96 1.00 0.81 0.77  CALCIUM 9.4  --   --  8.9  PROT 7.0  --   --  6.2*  BILITOT 0.6  --   --  0.6  ALKPHOS 64  --   --  55  ALT 20  --   --  18  AST 21  --   --  19  GLUCOSE 82 85  --  107*   Troponin (Point of Care Test)  Recent Labs  11/01/14 1436  TROPIPOC 0.00     Recent Labs Lab 11/01/14 2238 11/02/14 0417  TROPONINI <0.03 <0.03   Urinalysis    Component Value Date/Time   COLORURINE YELLOW 11/01/2014 [place] 11/01/2014 1511   LABSPEC 1.005 11/01/2014 1511   PHURINE 7.0 11/01/2014 1511   GLUCOSEU NEGATIVE 11/01/2014 1511   HGBUR NEGATIVE 11/01/2014 1511   BILIRUBINUR NEGATIVE 11/01/2014 1511   KETONESUR NEGATIVE 11/01/2014 1511   PROTEINUR NEGATIVE 11/01/2014 1511   UROBILINOGEN 0.2 11/01/2014 1511   NITRITE NEGATIVE 11/01/2014 1511   LEUKOCYTESUR TRACE* 11/01/2014 1511    Imaging/Diagnostic Tests: MRI/MRA head 5/13  FINDINGS: MRI HEAD FINDINGS  No restricted diffusion, acute hemorrhage, mass lesion, hydrocephalus, or extra-axial fluid. Specifically, no posterior circulation event.  Advanced atrophy. Moderate periventricular and subcortical white matter signal  abnormality, likely chronic microvascular ischemic change. Flow voids are maintained throughout the carotid, basilar, and vertebral arteries. There are no areas of chronic hemorrhage. Pituitary, pineal, and cerebellar tonsils unremarkable. No upper cervical lesions.  Post infusion, no abnormal enhancement of the brain or meninges. Visualized calvarium, skull base, and upper cervical osseous structures unremarkable. Scalp and extracranial soft tissues, orbits, sinuses, and mastoids show no acute process.  MRA HEAD FINDINGS  The internal carotid arteries are widely patent. The basilar artery is widely patent with vertebrals codominant. No intracranial stenosis or aneurysm.  MRI NECK FINDINGS  Unremarkable transverse arch. Conventional branching of the great vessels without proximal stenosis. No carotid bifurcation disease. No evidence for cervical ICA dissection or fibromuscular dysplasia. Tortuous BILATERAL vertebral origins without ostial disease, stenosis in the neck or vertebral artery dissection.  Ct Head: Bony calvarium appears intact. Mild diffuse cortical atrophy is noted. Mild chronic  ischemic white matter disease is noted. No mass effect or midline shift is noted. Ventricular size is within normal limits. There is no evidence of mass lesion, hemorrhage or acute infarction. IMPRESSION: Mild diffuse cortical atrophy. Mild chronic ischemic white matter disease. No acute intracranial abnormality seen. These results were called by telephone at the time of interpretation on 11/01/2014 at 2:51 pm to Dr. Leonel Ramsay, who verbally acknowledged these results. Electronically Signed By: Marijo Conception, M.D. On: 11/01/2014 14:52    Ma Hillock, DO 11/02/2014, 6:40 AM PGY-3, [facility] Intern pager: [phone], text pages welcome

## 2014-11-02 NOTE — Progress Notes (Signed)
Family wants MD notified that patient is having bilateral neck pain. They want MD to come and see patient and discuss options.

## 2014-11-02 NOTE — Progress Notes (Signed)
FPTS Interim Progress Note  S: Was paged by nurse that family wanted a doctor to come evaluate patient due to neck pain.   Upon entering room patient in bedside recliner with heating pads on neck and home CNA applying pressure. This is the routine they usually do that helps patient with pain. Patient stated her pain was somewhat improved from earlier. She received tylenol from nurse. Pain worse with head turning. She denied any vision changes. Upon discussion with family patient has had neck pain prior to admission before just not as frequent or severe.   Family also mentioned that they were concerned about not having a diagnosis for patient's previous episode nor do they understand why she is retaining urine. They were not satisfied with the diagnosis of possible TIA. Daughter is requesting further work-up and she is not letting mother leave the hospital without answers.   O: BP 120/67 mmHg  Pulse 80  Temp(Src) 97.5 F (36.4 C) (Oral)  Resp 16  Wt 186 lb 11.2 oz (84.687 kg)  SpO2 97%   General:mild distress, sitting in chair, alert Neck: some stiffness appreciated. nml ROM, muscle tightness, carotid pulses palpable Abd: tenderness to palpation in RLQ (around site of injection bruise), no suprapubic tenderness Neuro: no focal deficits  A/P: 1. Neck Pain - neck pain appears MSK in nature. Neuro exam was unremarkable. Patient with likely some degree of cervical OA due to age. Prn percocet given for pain. Pt has had MR and CT of head neck this admission that was unremarkable. Neurology signed off with no further recommendations. Will reaccess in the morning.  2. Urinary retention - see's urology as an outpatient. Family requesting a urologist by name to be consulted to come see patient. Discussed with them that that may not be possible if patient does not have privileges to the hospital(as well as it is the weekend). Will try voiding trial in am vs sending patient out on foley until urology  follow-up. UCx in process.   Of note, picture still unclear of all the different symptoms of patient that family ir reporting. No one diagnosis is appropriate for them all. I understand families frustration with not having any definitive answers. I tried discussing with the family the work-up we have done and what we are thinking. Most of there concerns can be further worked-up by the patient's PCP as they are not acute changes.   Received a call from charge nurse as well informing me that family is requesting to talk to hospital president. They want a team meeting including a doctor, charge nurse, [place], etc. One daughter the POA is driving from [facility] to come to hospital as she feels like the patient is not getting adequate care. Family has recently lost father and the family stated that they feel like he did not get adequate care. This may be playing a part in current situation as family still recovering from loss.    Katheren Shams, DO 11/02/2014, 9:25 PM PGY-1, [place] Medicine Service pager [phone]

## 2014-11-02 NOTE — Evaluation (Signed)
Physical Therapy Evaluation Patient Details Name: Valerie Chen MRN: [id number] DOB: 07-24-1928 Today's Date: 11/02/2014   History of Present Illness  Valerie Chen is a 81 y.o. female presenting with transient episode of b/l LE weakness,decreased visio. PMH is significant for Dementia, CAD, HTN, chronic chest pain. LE edema. No significant finding on MRI  Clinical Impression  Pt functioning near baseline. Pt with h/o dementia requiring 24/7 assist which is provided by hired CNA and daughter. Pt with mild generalized weakness and deconditioning compared to PTA. Acute PT to follow to progress mobility to supervision level.     Follow Up Recommendations No PT follow up;Supervision/Assistance - 24 hour    Equipment Recommendations  None recommended by PT    Recommendations for Other Services       Precautions / Restrictions Precautions Precautions: Fall Restrictions Weight Bearing Restrictions: No      Mobility  Bed Mobility Overal bed mobility: Needs Assistance Bed Mobility: Supine to Sit     Supine to sit: Supervision     General bed mobility comments: pt able to initiated and transfer self to EOB, supervision only due to first time working with patient  Transfers Overall transfer level: Needs assistance Equipment used: 1 person hand held assist Transfers: Sit to/from Stand Sit to Stand: Min guard         General transfer comment: pt reached for PTs hand to assist with standing  Ambulation/Gait Ambulation/Gait assistance: Min guard Ambulation Distance (Feet): 150 Feet Assistive device: 1 person hand held assist Gait Pattern/deviations: Step-through pattern;Decreased stride length;Wide base of support Gait velocity: wfl Gait velocity interpretation: at or above normal speed for age/gender General Gait Details: pt desired to link arms with PT, I believe for guidance due to being in unfamilar place  Stairs            Wheelchair Mobility    Modified  [place]in (Stroke Patients Only)       Balance Overall balance assessment: No apparent balance deficits (not formally assessed) (supervision more for safety due to dementia)                                           Pertinent Vitals/Pain Pain Assessment: No/denies pain    Home Living Family/patient expects to be discharged to:: Private residence Living Arrangements: Children Available Help at Discharge: Family;Personal care attendant;Available 24 hours/day Type of Home:  (condo) Home Access: Stairs to enter   [employer] of Steps: 1 (up from sidewalk) Home Layout: One level Home Equipment: Cane - quad;Shower seat Additional Comments: hired Cabin crew while daughter works    Prior Function Level of Independence: Secondary school teacher / Transfers Assistance Needed: supervision  ADL's / Land Needed: completes all ADLs except bathing-requires min/modA  Comments: supervision/assist due to safety due to dementia     Hand Dominance        Extremity/Trunk Assessment   Upper Extremity Assessment: Overall WFL for tasks assessed           Lower Extremity Assessment: Overall WFL for tasks assessed      Cervical / Trunk Assessment: Normal  Communication   Communication: No difficulties  Cognition Arousal/Alertness: Awake/alert Behavior During Therapy: WFL for tasks assessed/performed Overall Cognitive Status: History of cognitive impairments - at baseline (pt with dementia, oriented to self only, pleasantly confused)       Memory: Decreased short-term memory  General Comments      Exercises        Assessment/Plan    PT Assessment Patient needs continued PT services  PT Diagnosis Generalized weakness   PT Problem List Decreased strength;Decreased activity tolerance;Decreased balance;Decreased mobility;Decreased cognition  PT Treatment Interventions Gait training;Stair training;Functional  mobility training;Therapeutic activities;Therapeutic exercise;Balance training   PT Goals (Current goals can be found in the Care Plan section) Acute Rehab PT Goals Patient Stated Goal: didn't state PT Goal Formulation: With family Time For Goal Achievement: 11/09/14 Potential to Achieve Goals: Good    Frequency Min 3X/week   Barriers to discharge        Co-evaluation               End of Session Equipment Utilized During Treatment: Gait belt Activity Tolerance: Patient tolerated treatment well Patient left: in chair;with call bell/phone within reach;with chair alarm set Nurse Communication: Mobility status    Functional Assessment Tool Used: clincial judgement Functional Limitation: Mobility: Walking and moving around Mobility: Walking and Moving Around Current Status [phone]): At least 1 percent but less than 20 percent impaired, limited or restricted Mobility: Walking and Moving Around Goal Status [phone]): 0 percent impaired, limited or restricted    Time: 5364-6803 PT Time Calculation (min) (ACUTE ONLY): 21 min   Charges:   PT Evaluation $Initial PT Evaluation Tier I: 1 Procedure     PT G Codes:   PT G-Codes **NOT FOR INPATIENT CLASS** Functional Assessment Tool Used: clincial judgement Functional Limitation: Mobility: Walking and moving around Mobility: Walking and Moving Around Current Status (O1224): At least 1 percent but less than 20 percent impaired, limited or restricted Mobility: Walking and Moving Around Goal Status [phone]): 0 percent impaired, limited or restricted    Kingsley Callander 11/02/2014, 10:33 AM   Kittie Plater, PT, DPT Pager #: [phone] Office #: [phone]

## 2014-11-02 NOTE — Progress Notes (Signed)
Subjective: Some confusion, but no further events.   Exam: Filed Vitals:   11/02/14 1002  BP: 113/56  Pulse: 73  Temp: 97.6 F (36.4 C)  Resp: 16   Gen: In bed, NAD MS: awake, not oriented HU:TMLY Motor: MAEW Sensory:intact to LT  Impression: 81 yo F with transient lightheadedness, blurred vision and gait difficulty. This sounds like hypoperfusion and I do not think that TIA is very likely given the lack of localizing symptoms. With normal MRI, I do not think that further neurological workup is at all liekly to be helpful. If she continues having events, then further workup could be performed at that time.   Recommendations: 1) No further recommendations at this time. Please call with any further questions or concerns.   Roland Rack, MD [facility] [phone]  If 7pm- 7am, please page neurology on call as listed in [place].

## 2014-11-02 NOTE — Progress Notes (Signed)
Echocardiogram 2D Echocardiogram has been performed.  Tresa Res 11/02/2014, 11:06 AM

## 2014-11-03 ENCOUNTER — Observation Stay (HOSPITAL_COMMUNITY): Payer: Medicare Other

## 2014-11-03 DIAGNOSIS — H53123 Transient visual loss, bilateral: Secondary | ICD-10-CM | POA: Diagnosis not present

## 2014-11-03 DIAGNOSIS — R339 Retention of urine, unspecified: Secondary | ICD-10-CM | POA: Diagnosis not present

## 2014-11-03 DIAGNOSIS — M542 Cervicalgia: Secondary | ICD-10-CM | POA: Diagnosis present

## 2014-11-03 DIAGNOSIS — R531 Weakness: Secondary | ICD-10-CM | POA: Diagnosis not present

## 2014-11-03 DIAGNOSIS — M7989 Other specified soft tissue disorders: Secondary | ICD-10-CM | POA: Diagnosis not present

## 2014-11-03 DIAGNOSIS — R29898 Other symptoms and signs involving the musculoskeletal system: Secondary | ICD-10-CM | POA: Diagnosis not present

## 2014-11-03 LAB — URINE CULTURE
Colony Count: NO GROWTH
Colony Count: NO GROWTH
Culture: NO GROWTH
Culture: NO GROWTH

## 2014-11-03 LAB — TROPONIN I

## 2014-11-03 NOTE — Progress Notes (Addendum)
Daughters requesting MD be called, they state she is "very bloated" and they are wanting and abdominal Xray or ultrasound. They also stated that a "Dr Gerarda Fraction" was to call them back at 9pm last night to report any drug interactions that may be contributing to patients condition. Dr Wendi Snipes notified.

## 2014-11-03 NOTE — Progress Notes (Addendum)
Called to room for bed alarm. Patient sitting on commode with daughter at side. Daughter requesting to know how to disable bed alarm as the wait time was 7 minutes and the alarm is distressing to mother. Writer educated that staff would address alarms, and if wait time is too long she may assume risk of toileting. Writer also educated that urine bag should not be above level of bladder- daughter stated that she did not want it on the floorWater quality scientist offered linen to place it on. Writer offered to assist with perineal hygiene and daughter refused. Daughter assisted patient to chair. Daughters anxious about directing patient care.

## 2014-11-03 NOTE — Progress Notes (Signed)
Daughters much more relaxed and confident after meeting with the MDs. Assured daughters that staff would be attentive when personal aide arrives.

## 2014-11-03 NOTE — Progress Notes (Signed)
Family against using narcotics as pain control. They state "they have been using Tylenol at home for the neck pain" and are ok with [place] continuing to use Tylenol for their mothers pain.  Patient not complaining of any neck pain at the moment. Valerie Blanks, RN

## 2014-11-03 NOTE — Progress Notes (Signed)
Reported back to daughter Dr Wendi Snipes reply. The medications have been checked and no interactions noted. The MD checked her abdomen this morning and she is having normal BMs daily, so we will continue to monitor.

## 2014-11-03 NOTE — Progress Notes (Signed)
OT Cancellation Note  Patient Details Name: Valerie Chen MRN: [id number] DOB: [birth year]   Cancelled Treatment:    Reason Eval/Treat Not Completed: OT screened, no needs identified, will sign off - spoke at length with PT.  Pt is close to her baseline physically and  with functional mobility, and work up negative for acute stroke.  She has a history of dementia and 24 hour caregivers at home, and necessary DME.   Pt is likely not going to be functioning at her normal level with ADLs due to the dementia and change of environment.  Would recommend she transition back to home with 24 hour supervision.  If pt does not return to baseline, then would recommend HHOT at that time.  Will sign off.   Darlina [place], OTR/L [phone]  11/03/2014, 10:10 AM

## 2014-11-03 NOTE — Progress Notes (Signed)
Family Medicine Teaching Service Daily Progress Note Intern Pager: [phone]  Patient name: Valerie Chen Medical record number: [id number] Date of birth: 04/19/1929 Age: 81 y.o. Gender: female  Primary Care Provider: Monico Blitz, MD Consultants: Neurology, consider urology Code Status: DNR (has form)  Pt Overview and Major Events to Date:  5/13: admit to telemetry unit. MRI/MRA/CT negative for acute process. 5/14: Continued urinary retention >> Foley placed 5/15: neck pain Friday and Saturday, voiding trial today and waiting for urine culture, plain film of neck  Assessment and Plan: Valerie Chen is a 81 y.o. female presenting with transient episode of b/l LE weakness,decreased visio. PMH is significant for Dementia, CAD, HTN, chronic chest pain. LE edema  Concern for stroke/TIA:  Neurology consulted for concern of stroke/TIA, have signed off with negative work up and they state is most liekly transient hypoperfusion such as orthostasis or transient arrythmia - continue telemetry - so far has shown only brady to 49 while sleeping and intermittent 1st degree AV block - echo in AM - pending - monitor closely - TSH 3.604 -  A1c pending still -  lipid panel: Total179/Trig 96/H72/L88 - continue pravastatin  Neck pain - Likely MSK, no vascular findings on MRA - No tenderness to palpation - at home happens once every 1-3 weeks - helped by heat and massage, family not wanting narcotics - consider baclofen if returns - plain film  Urinary retention:  - Over last 2 months recurrent UTI on amox ppx by urology - Post void residual in ED 475 cc. In/out cath x1.  - US bladder/kidneys  Form 5/9 is normal (from [facility]) - urine has no signs of infection, Culture pending - Patients home medications have been reviewed and nothing stands out as possible cause of retention.  - voiding trial today- If requiring x3 in/out cath (had one in ED) then place foley cath >>> pt required  foley cath insert last night.  - Consider CT abd/pelvis - would like to change urologists, Requests Dr. Jannifer Franklin (on [address]?)  CAD- Stable, Recently seen by cardiology 05/2014 stable at that time - Continue ASA - pravastatin - lopressor - Continue home Imdur  LE edema: Chronic history of edema, last echo in the system is 2013 with 50% EF at that time.  - Lasix 40 daily continued - Echo ordered - pending  Dementia:  alzheimer's dementia.  - Declining per family - She lives at home with her daughter and has a CMA daily in the home. She is able to perform some of her ADL's, including toilet. She uses a walker in the home for mobility. She wears glasses, but does not have them with her. Her daughter is spending the nights with her while admitted.  - continue home seroquel, mirtazapine - would like referral/arrangement to Dr. Dellia Nims, a local geriatrician [phone])  HTN- Stable - Continue home Norvasc and metorpolol  FEN/GI: reg diet/KVO PPx: Sibq hep  Disposition: continue tele for neck pain, urinary retention, and monitoring for TIA like episode, then home hopefully tomorrow  Subjective:  Lengthy discussion with family today, 45+ minutes. No new symptoms and her MS is improving. They are very concerned about neck pain and urinary retention. Agreeable with voiding trial  Objective: Temp:  [97.5 F (36.4 C)-97.8 F (36.6 C)] 97.5 F (36.4 C) (05/15 0918) Pulse Rate:  [61-80] 61 (05/15 0918) Resp:  [16-20] 18 (05/15 0918) BP: (111-137)/(53-67) 117/53 mmHg (05/15 0918) SpO2:  [94 %-97 %] 95 % (05/15 0918) Weight:  [186 lb  9.9 oz (84.65 kg)] 186 lb 9.9 oz (84.65 kg) (05/15 0500)  Physical Exam: General: NAD, sitting in chair comfortably, cooperative with exam, smiles, answers questions appropriately Eyes: EOMI,  Neck: Supple, no LAD noted. Mid line trachea- no tenderness to palpation Cardiovascular: RRR, no murmur, clicks gallops or rubs appreciated Respiratory: CTAB, no  wheeze, rhonchi or rales. Normal WOB Abdomen: soft, NTND, + BS Extremities: b/l trace LE edema, no tenderness or erythema, 2+ DP pulses Neuro: sitting up in bed, eating. Oriented X person and day of the week, not place. Strength 5/5 and sensation intact in All 4 extremities, CN2-12 intact  Laboratory:  Recent Labs Lab 11/01/14 1434 11/01/14 1438 11/01/14 2238 11/02/14 0417  WBC 4.7  --  6.6 5.5  HGB 16.7* 17.0* 16.4* 15.0  HCT 47.6* 50.0* 47.6* 44.2  PLT 177  --  180 167    Recent Labs Lab 11/01/14 1433 11/01/14 1438 11/01/14 2238 11/02/14 0417  NA 137 138  --  137  K 4.3 4.3  --  3.8  CL 101 101  --  105  CO2 25  --   --  23  BUN 9 10  --  14  CREATININE 0.96 1.00 0.81 0.77  CALCIUM 9.4  --   --  8.9  PROT 7.0  --   --  6.2*  BILITOT 0.6  --   --  0.6  ALKPHOS 64  --   --  55  ALT 20  --   --  18  AST 21  --   --  19  GLUCOSE 82 85  --  107*   Troponin (Point of Care Test)  Recent Labs  11/01/14 1436  TROPIPOC 0.00      Recent Labs Lab 11/01/14 2238 11/02/14 0417 11/02/14 2150 11/03/14 0221  TROPONINI <0.03 <0.03 <0.03 <0.03   Urinalysis    Component Value Date/Time   COLORURINE YELLOW 11/02/2014 0813   APPEARANCEUR CLEAR 11/02/2014 0813   LABSPEC 1.009 11/02/2014 0813   PHURINE 6.5 11/02/2014 0813   GLUCOSEU NEGATIVE 11/02/2014 0813   HGBUR MODERATE* 11/02/2014 0813   BILIRUBINUR NEGATIVE 11/02/2014 0813   KETONESUR NEGATIVE 11/02/2014 0813   PROTEINUR NEGATIVE 11/02/2014 0813   UROBILINOGEN 0.2 11/02/2014 0813   NITRITE NEGATIVE 11/02/2014 0813   LEUKOCYTESUR NEGATIVE 11/02/2014 0813    Imaging/Diagnostic Tests: MRI/MRA head 5/13  FINDINGS: MRI HEAD FINDINGS  No restricted diffusion, acute hemorrhage, mass lesion, hydrocephalus, or extra-axial fluid. Specifically, no posterior circulation event.  Advanced atrophy. Moderate periventricular and subcortical white matter signal abnormality, likely chronic microvascular  ischemic change. Flow voids are maintained throughout the carotid, basilar, and vertebral arteries. There are no areas of chronic hemorrhage. Pituitary, pineal, and cerebellar tonsils unremarkable. No upper cervical lesions.  Post infusion, no abnormal enhancement of the brain or meninges. Visualized calvarium, skull base, and upper cervical osseous structures unremarkable. Scalp and extracranial soft tissues, orbits, sinuses, and mastoids show no acute process.  MRA HEAD FINDINGS  The internal carotid arteries are widely patent. The basilar artery is widely patent with vertebrals codominant. No intracranial stenosis or aneurysm.  MRI NECK FINDINGS  Unremarkable transverse arch. Conventional branching of the great vessels without proximal stenosis. No carotid bifurcation disease. No evidence for cervical ICA dissection or fibromuscular dysplasia. Tortuous BILATERAL vertebral origins without ostial disease, stenosis in the neck or vertebral artery dissection.  Ct Head: Bony calvarium appears intact. Mild diffuse cortical atrophy is noted. Mild chronic ischemic white matter disease is noted. No mass  effect or midline shift is noted. Ventricular size is within normal limits. There is no evidence of mass lesion, hemorrhage or acute infarction. IMPRESSION: Mild diffuse cortical atrophy. Mild chronic ischemic white matter disease. No acute intracranial abnormality seen. These results were called by telephone at the time of interpretation on 11/01/2014 at 2:51 pm to Dr. Leonel Ramsay, who verbally acknowledged these results. Electronically Signed By: Marijo Conception, M.D. On: 11/01/2014 14:52    Timmothy Euler, MD 11/03/2014, 9:30 AM PGY-3, [place] Intern pager: [phone], text pages welcome

## 2014-11-03 NOTE — Progress Notes (Signed)
Foley catheter removed per order. Patient tolerated well. Plan for voiding and bladder scan reviewed with daughters and caregiver.

## 2014-11-04 DIAGNOSIS — M542 Cervicalgia: Secondary | ICD-10-CM | POA: Diagnosis not present

## 2014-11-04 DIAGNOSIS — R531 Weakness: Secondary | ICD-10-CM | POA: Diagnosis not present

## 2014-11-04 DIAGNOSIS — R410 Disorientation, unspecified: Secondary | ICD-10-CM | POA: Diagnosis not present

## 2014-11-04 DIAGNOSIS — H53123 Transient visual loss, bilateral: Secondary | ICD-10-CM | POA: Diagnosis not present

## 2014-11-04 DIAGNOSIS — R339 Retention of urine, unspecified: Secondary | ICD-10-CM | POA: Diagnosis not present

## 2014-11-04 LAB — HEMOGLOBIN A1C
Hgb A1c MFr Bld: 5.3 % (ref 4.8–5.6)
Mean Plasma Glucose: 105 mg/dL

## 2014-11-04 MED ORDER — ACETAMINOPHEN 325 MG PO TABS: 650.0000 mg | ORAL_TABLET | Freq: Four times a day (QID) | ORAL | Status: DC | PRN

## 2014-11-04 NOTE — Discharge Summary (Signed)
[facility] Discharge Summary  Patient name: Valerie Chen Medical record number: [id number] Date of birth: Jul 28, 1928 Age: 81 y.o. Gender: female Date of Admission: 11/01/2014  Date of Discharge: 11/04/14 Admitting Physician: Lupita Dawn, MD  Primary Care Provider: Monico Blitz, MD Consultants: neurology  Indication for Hospitalization: weakness, concern for stroke/TIA  Discharge Diagnoses/Problem List:  Neck pain Urinary retention- resolved CAD Dementia HTN   Disposition: Discharge home with daughters  Discharge Condition: Stable  Discharge Exam:  Blood pressure 134/52, pulse 64, temperature 97.6 F (36.4 C), temperature source Oral, resp. rate 16, weight 186 lb 9.9 oz (84.65 kg), SpO2 96 %.  Orthostatic VS for the past 24 hrs:  BP- Lying Pulse- Lying BP- Sitting Pulse- Sitting BP- Standing at 0 minutes Pulse- Standing at 0 minutes  11/04/14 0900 124/58 mmHg 64 134/52 mmHg 64 123/63 mmHg 78   Gen: awake, alert, well appearing female sitting up in bedside chair, NAD, daughter Fraser Din at bedside Cardio: RRR, no murmurs Pulm: CTAB, no increased WOB Abd: soft, NT/ND, +BS Ext: thin, warm, no edema MSK: patient ambulates with walker at home and with assistance of her daugther her, 5/5 strength UE and LE Neuro: AO to self only, no focal neurological deficits, sensation grossly intact, CN 2-12 grossly intact Skin: no rashes, skin is dry and warm  Brief Hospital Course:  Valerie Chen is an 81 y/o female with PMH Dementia, DAD, HTN, and chronic UTI presents with concern for acute stroke on 11/01/14.    Neuro: On admission, patient had acute vision changes and leg weakness.  Symptoms resolved within 1 hour.  She was, however, noted to be more confused than her baseline.  MRI/MRA was unremarkable for acute stroke.  Neurology consulted, who recommended no further workup in light of normal MRI.  EKG/troponins were negative for acute ischemic etiology.   Echocardiogram was unremarkable, with EF 50-55%.  Orthostatic vital signs negative for orthostasis.  PT/OT evaluated patient and and recommended 24 hour supervision but no additional therapies needed.  Urology: Patient with h/o frequent UTI on prophylaxis by outpatient urology.  UA with hemoglobin.  Home Amoxicillin continued.  Urine culture negative.  Patient noted to have urinary retention with PRV 475cc in the ED.  Ultrasound of kidneys/bladder were obtained earlier in the week outpatient.  A copy of this was provided and reviewed.  Home medications were reviewed and no obvious pharmacological etiology for retention.  UOP closely monitored.  Patient passed voiding trial.    Cardio: Patient with known HTN and CAD, well controlled on home medications of Norvasc, Metoprolol, Pravastatin, and Imdur.  These medications were continued.  LE edema appreciated on admission.  Patient diuresed.  Echo as above.    MSK:  Patient presented with an acute flare of chronic neck pain that is intermittent.  Neck xrays obtained revealing degenerative changes in C5-6.  Patient/ daughters were recommended to take Tylenol PRN neck pain.  Discussed consideration of Baclofen by PCP, if neck pain not relieved by Tylenol.  Social:  Patient being discharged home with daughter.  Daughters voiced strong concern for mother's health during hospitalization.  They asked for referral to Geriatrician, Dr Dellia Nims, who no longer practices with [facility].  Valerie Chen has been scheduled with the practice with the earliest available geriatrician.     Issues for Follow Up:  1. Dementia, consider reevaluating Seroquel use 2. A1c still pending at time of discharge 3. Consider Baclofen if no relief of patient's neck pain by Tylenol/heat  Significant Procedures: 2D echo  Significant Labs and Imaging:   Recent Labs Lab 11/01/14 1434 11/01/14 1438 11/01/14 2238 11/02/14 0417  WBC 4.7  --  6.6 5.5  HGB 16.7* 17.0* 16.4*  15.0  HCT 47.6* 50.0* 47.6* 44.2  PLT 177  --  180 167    Recent Labs Lab 11/01/14 1433 11/01/14 1438 11/01/14 2238 11/02/14 0417 11/02/14 2150  NA 137 138  --  137  --   K 4.3 4.3  --  3.8  --   CL 101 101  --  105  --   CO2 25  --   --  23  --   GLUCOSE 82 85  --  107*  --   BUN 9 10  --  14  --   CREATININE 0.96 1.00 0.81 0.77  --   CALCIUM 9.4  --   --  8.9  --   MG  --   --   --   --  2.2  ALKPHOS 64  --   --  55  --   AST 21  --   --  19  --   ALT 20  --   --  18  --   ALBUMIN 4.1  --   --  3.4*  --    Urinalysis    Component Value Date/Time   COLORURINE YELLOW 11/02/2014 0813   APPEARANCEUR CLEAR 11/02/2014 0813   LABSPEC 1.009 11/02/2014 0813   PHURINE 6.5 11/02/2014 0813   GLUCOSEU NEGATIVE 11/02/2014 0813   HGBUR MODERATE* 11/02/2014 0813   BILIRUBINUR NEGATIVE 11/02/2014 0813   KETONESUR NEGATIVE 11/02/2014 0813   PROTEINUR NEGATIVE 11/02/2014 0813   UROBILINOGEN 0.2 11/02/2014 0813   NITRITE NEGATIVE 11/02/2014 0813   LEUKOCYTESUR NEGATIVE 11/02/2014 0813   Urine culture: No growth  Dg Cervical Spine 2-3 Views  11/03/2014   CLINICAL DATA:  Neck pain.  EXAM: CERVICAL SPINE - 2-3 VIEW  COMPARISON:  None.  FINDINGS: No fracture is noted. No significant spondylolisthesis is noted. Severe degenerative disc disease is noted at C5-6 with anterior osteophyte formation. Mild hypertrophy of left-sided posterior facet joint is noted at C5-6.  IMPRESSION: Severe degenerative disc disease is noted at C5-6. No acute abnormality seen in the cervical spine.   Electronically Signed   By: Marijo Conception, M.D.   On: 11/03/2014 11:45   Ct Head Wo Contrast  11/01/2014   CLINICAL DATA:  Aphasia.  EXAM: CT HEAD WITHOUT CONTRAST  TECHNIQUE: Contiguous axial images were obtained from the base of the skull through the vertex without intravenous contrast.  COMPARISON:  CT scan of September 13, 2012.  FINDINGS: Bony calvarium appears intact. Mild diffuse cortical atrophy is noted. Mild  chronic ischemic white matter disease is noted. No mass effect or midline shift is noted. Ventricular size is within normal limits. There is no evidence of mass lesion, hemorrhage or acute infarction.  IMPRESSION: Mild diffuse cortical atrophy. Mild chronic ischemic white matter disease. No acute intracranial abnormality seen. These results were called by telephone at the time of interpretation on 11/01/2014 at 2:51 pm to Dr. Leonel Ramsay, who verbally acknowledged these results.   Electronically Signed   By: Marijo Conception, M.D.   On: 11/01/2014 14:52   Mr Jodene Nam Head Wo Contrast  11/01/2014   CLINICAL DATA:  Elderly woman with dementia and migraines presenting with transient episode of lightheadedness. Symptoms now resolved. Neck pain.  EXAM: MRI HEAD WITHOUT AND WITH CONTRAST AND MRA HEAD  WITHOUT AND WITH CONTRAST AND MRI NECK WITHOUT AND WITH CONTRAST  TECHNIQUE: Multiplanar, multiecho pulse sequences of the brain and surrounding structures were obtained without and with intravenous contrast. Angiographic images of the head were obtained using MRA technique without and with contrast. Multiplanar, multiecho pulse sequences of the neck and surrounding structures were obtained without and with intravenous contrast.  CONTRAST:  14mL MULTIHANCE GADOBENATE DIMEGLUMINE 529 MG/ML IV SOLN  COMPARISON:  CT head earlier in the day  FINDINGS: MRI HEAD FINDINGS  No restricted diffusion, acute hemorrhage, mass lesion, hydrocephalus, or extra-axial fluid. Specifically, no posterior circulation event.  Advanced atrophy. Moderate periventricular and subcortical white matter signal abnormality, likely chronic microvascular ischemic change. Flow voids are maintained throughout the carotid, basilar, and vertebral arteries. There are no areas of chronic hemorrhage. Pituitary, pineal, and cerebellar tonsils unremarkable. No upper cervical lesions.  Post infusion, no abnormal enhancement of the brain or meninges. Visualized calvarium,  skull base, and upper cervical osseous structures unremarkable. Scalp and extracranial soft tissues, orbits, sinuses, and mastoids show no acute process.  MRA HEAD FINDINGS  The internal carotid arteries are widely patent. The basilar artery is widely patent with vertebrals codominant. No intracranial stenosis or aneurysm.  MRI NECK FINDINGS  Unremarkable transverse arch. Conventional branching of the great vessels without proximal stenosis. No carotid bifurcation disease. No evidence for cervical ICA dissection or fibromuscular dysplasia. Tortuous BILATERAL vertebral origins without ostial disease, stenosis in the neck or vertebral artery dissection.  IMPRESSION: Age-related changes as described.  No acute intracranial findings.  No intracranial or extracranial flow reducing lesion or occlusion.   Electronically Signed   By: Rolla Flatten M.D.   On: 11/01/2014 19:19   Mr Angiogram Neck W Wo Contrast  11/01/2014   CLINICAL DATA:  Elderly woman with dementia and migraines presenting with transient episode of lightheadedness. Symptoms now resolved. Neck pain.  EXAM: MRI HEAD WITHOUT AND WITH CONTRAST AND MRA HEAD WITHOUT AND WITH CONTRAST AND MRI NECK WITHOUT AND WITH CONTRAST  TECHNIQUE: Multiplanar, multiecho pulse sequences of the brain and surrounding structures were obtained without and with intravenous contrast. Angiographic images of the head were obtained using MRA technique without and with contrast. Multiplanar, multiecho pulse sequences of the neck and surrounding structures were obtained without and with intravenous contrast.  CONTRAST:  3mL MULTIHANCE GADOBENATE DIMEGLUMINE 529 MG/ML IV SOLN  COMPARISON:  CT head earlier in the day  FINDINGS: MRI HEAD FINDINGS  No restricted diffusion, acute hemorrhage, mass lesion, hydrocephalus, or extra-axial fluid. Specifically, no posterior circulation event.  Advanced atrophy. Moderate periventricular and subcortical white matter signal abnormality, likely  chronic microvascular ischemic change. Flow voids are maintained throughout the carotid, basilar, and vertebral arteries. There are no areas of chronic hemorrhage. Pituitary, pineal, and cerebellar tonsils unremarkable. No upper cervical lesions.  Post infusion, no abnormal enhancement of the brain or meninges. Visualized calvarium, skull base, and upper cervical osseous structures unremarkable. Scalp and extracranial soft tissues, orbits, sinuses, and mastoids show no acute process.  MRA HEAD FINDINGS  The internal carotid arteries are widely patent. The basilar artery is widely patent with vertebrals codominant. No intracranial stenosis or aneurysm.  MRI NECK FINDINGS  Unremarkable transverse arch. Conventional branching of the great vessels without proximal stenosis. No carotid bifurcation disease. No evidence for cervical ICA dissection or fibromuscular dysplasia. Tortuous BILATERAL vertebral origins without ostial disease, stenosis in the neck or vertebral artery dissection.  IMPRESSION: Age-related changes as described.  No acute intracranial findings.  No intracranial  or extracranial flow reducing lesion or occlusion.   Electronically Signed   By: Rolla Flatten M.D.   On: 11/01/2014 19:19   Mr Jeri Cos MI Contrast  11/01/2014   CLINICAL DATA:  Elderly woman with dementia and migraines presenting with transient episode of lightheadedness. Symptoms now resolved. Neck pain.  EXAM: MRI HEAD WITHOUT AND WITH CONTRAST AND MRA HEAD WITHOUT AND WITH CONTRAST AND MRI NECK WITHOUT AND WITH CONTRAST  TECHNIQUE: Multiplanar, multiecho pulse sequences of the brain and surrounding structures were obtained without and with intravenous contrast. Angiographic images of the head were obtained using MRA technique without and with contrast. Multiplanar, multiecho pulse sequences of the neck and surrounding structures were obtained without and with intravenous contrast.  CONTRAST:  58mL MULTIHANCE GADOBENATE DIMEGLUMINE 529  MG/ML IV SOLN  COMPARISON:  CT head earlier in the day  FINDINGS: MRI HEAD FINDINGS  No restricted diffusion, acute hemorrhage, mass lesion, hydrocephalus, or extra-axial fluid. Specifically, no posterior circulation event.  Advanced atrophy. Moderate periventricular and subcortical white matter signal abnormality, likely chronic microvascular ischemic change. Flow voids are maintained throughout the carotid, basilar, and vertebral arteries. There are no areas of chronic hemorrhage. Pituitary, pineal, and cerebellar tonsils unremarkable. No upper cervical lesions.  Post infusion, no abnormal enhancement of the brain or meninges. Visualized calvarium, skull base, and upper cervical osseous structures unremarkable. Scalp and extracranial soft tissues, orbits, sinuses, and mastoids show no acute process.  MRA HEAD FINDINGS  The internal carotid arteries are widely patent. The basilar artery is widely patent with vertebrals codominant. No intracranial stenosis or aneurysm.  MRI NECK FINDINGS  Unremarkable transverse arch. Conventional branching of the great vessels without proximal stenosis. No carotid bifurcation disease. No evidence for cervical ICA dissection or fibromuscular dysplasia. Tortuous BILATERAL vertebral origins without ostial disease, stenosis in the neck or vertebral artery dissection.  IMPRESSION: Age-related changes as described.  No acute intracranial findings.  No intracranial or extracranial flow reducing lesion or occlusion.   Electronically Signed   By: Rolla Flatten M.D.   On: 11/01/2014 19:19   2D Echocardiogram: Study Conclusions  Left ventricle: The cavity size was normal. Systolic function was normal. The estimated ejection fraction was in the range of 50% to 55%. Regional wall motion abnormalities cannot be excluded. Impressions: - The contrast study was inadequate to rule out shunt. No cardiac source of embolism was identified, but cannot be ruled out on the basis of this  examination.  Results/Tests Pending at Time of Discharge: hemoglobin A1c  Discharge Medications:    Medication List    STOP taking these medications        ondansetron 4 MG tablet  Commonly known as:  ZOFRAN     traMADol 50 MG tablet  Commonly known as:  ULTRAM      TAKE these medications        acetaminophen 325 MG tablet  Commonly known as:  TYLENOL  Take 2 tablets (650 mg total) by mouth every 6 (six) hours as needed for mild pain (or Fever >/= 101).     amLODipine 5 MG tablet  Commonly known as:  NORVASC  Take 5 mg by mouth every morning.     amoxicillin 250 MG capsule  Commonly known as:  AMOXIL  Take 250 mg by mouth daily.     aspirin EC 81 MG tablet  Take 81 mg by mouth every morning.     CRANBERRY EXTRACT PO  Take 3,600 mg by mouth daily.  cyanocobalamin 500 MCG tablet  Take 500 mcg by mouth daily.     docusate sodium 100 MG capsule  Commonly known as:  COLACE  Take 200 mg by mouth 2 (two) times daily.     furosemide 40 MG tablet  Commonly known as:  LASIX  Take 40 mg by mouth every morning.     isosorbide mononitrate 30 MG 24 hr tablet  Commonly known as:  IMDUR  TAKE 1/2 TABLET BY MOUTH DAILY CUT FOR PATIENT     metoprolol tartrate 12.5 mg Tabs tablet  Commonly known as:  LOPRESSOR  Take 0.5 tablets (12.5 mg total) by mouth 2 (two) times daily.     mirtazapine 30 MG tablet  Commonly known as:  REMERON  Take 30 mg by mouth at bedtime.     potassium chloride SA 20 MEQ tablet  Commonly known as:  K-DUR,KLOR-CON  Take 1 tablet (20 mEq total) by mouth 2 (two) times daily.     pravastatin 20 MG tablet  Commonly known as:  PRAVACHOL  Take 20 mg by mouth daily.     QUEtiapine 25 MG tablet  Commonly known as:  SEROQUEL  Take 25 mg by mouth daily. Takes 1 tablet daily        Discharge Instructions: Please refer to Patient Instructions section of EMR for full details.  Patient was counseled important signs and symptoms that should prompt  return to medical care, changes in medications, dietary instructions, activity restrictions, and follow up appointments.   Follow-Up Appointments: Follow-up Information    Follow up with Gildardo Cranker, DO On 11/20/2014.   Specialty:  Internal Medicine   Why:  at 8:45am Please arrive by 8:15am.  Also, new patient packet must be returned no later than 2 days prior to appointment   Contact information:   [place] [phone]       Follow up with [facility], MD. Schedule an appointment as soon as possible for a visit in 3 days.   Specialty:  Internal Medicine   Why:  for hospital follow up   Contact information:   [place] [phone]       Janora Norlander, DO 11/04/2014, 1:52 PM PGY-1, [place]

## 2014-11-04 NOTE — Progress Notes (Addendum)
Discharge instruction reviewed with patient/family. Family appeared unsatisfied with referral MD. Attending MD called to speak with family.    Ave Filter, RN

## 2014-11-04 NOTE — Progress Notes (Signed)
PVR (per family request) with 63cc after void this AM. Assisted patient to chair for breakfast. Patient denied any pain. No other distress noted.   Ave Filter, RN

## 2014-11-04 NOTE — Discharge Instructions (Signed)
We are so glad to see that you are doing better, Valerie Chen.  You were admitted with weakness and concerns for stroke.  You had imaging of your brain and you were seen by neurology who have ruled this out.  You also had an echocardiogram done that was normal.  You should continue to take your antibiotic as prescribed for prevention of urinary tract infections.  Your neck xray shows degenerative changes in the cervical vertebra.  You may take Tylenol for this.  Discuss with your PCP if Baclofen would be appropriate if tylenol ineffective.  You have an appointment with [facility] geriatrics on 6/1.  Please see your PCP Dr Manuella Ghazi within the next week for hospital follow up.   Dizziness  Dizziness means you feel unsteady or lightheaded. You might feel like you are going to pass out (faint). HOME CARE   Drink enough fluids to keep your pee (urine) clear or pale yellow.  Take your medicines exactly as told by your doctor. If you take blood pressure medicine, always stand up slowly from the lying or sitting position. Hold on to something to steady yourself.  If you need to stand in one place for a long time, move your legs often. Tighten and relax your leg muscles.  Have someone stay with you until you feel okay.  Do not drive or use heavy machinery if you feel dizzy.  Do not drink alcohol. GET HELP RIGHT AWAY IF:   You feel dizzy or lightheaded and it gets worse.  You feel sick to your stomach (nauseous), or you throw up (vomit).  You have trouble talking or walking.  You feel weak or have trouble using your arms, hands, or legs.  You cannot think clearly or have trouble forming sentences.  You have chest pain, belly (abdominal) pain, sweating, or you are short of breath.  Your vision changes.  You are bleeding.  You have problems from your medicine that seem to be getting worse. MAKE SURE YOU:   Understand these instructions.  Will watch your condition.  Will get help right away if  you are not doing well or get worse. Document Released: 05/27/2011 Document Revised: 08/30/2011 Document Reviewed: 05/27/2011 [facility] Patient Information 2015 [place], [place]. This information is not intended to replace advice given to you by your health care provider. Make sure you discuss any questions you have with your health care provider.  Acute Urinary Retention Urinary retention means you are unable to pee completely or at all (empty your bladder). HOME CARE  Drink enough fluids to keep your pee (urine) clear or pale yellow.  If you are sent home with a tube that drains the bladder (catheter), there will be a drainage bag attached to it. There are two types of bags. One is big that you can wear at night without having to empty it. One is smaller and needs to be emptied more often.  Keep the drainage bag emptied.  Keep the drainage bag lower than the tube.  Only take medicine as told by your doctor. GET HELP IF:  You have a low-grade fever.  You have spasms or you are leaking pee when you have spasms. GET HELP RIGHT AWAY IF:   You have chills or a fever.  Your catheter stops draining pee.  Your catheter falls out.  You have increased bleeding that does not stop after you have rested and increased the amount of fluids you had been drinking. MAKE SURE YOU:   Understand these instructions.  Will watch your condition.  Will get help right away if you are not doing well or get worse. Document Released: 11/24/2007 Document Revised: 06/12/2013 Document Reviewed: 11/16/2012 [facility] Patient Information 2015 [place], [place]. This information is not intended to replace advice given to you by your health care provider. Make sure you discuss any questions you have with your health care provider. Fall Prevention and Home Safety Falls cause injuries and can affect all age groups. It is possible to prevent falls.  HOW TO PREVENT FALLS  Wear shoes with rubber soles that do not  have an opening for your toes.  Keep the inside and outside of your house well lit.  Use night lights throughout your home.  Remove clutter from floors.  Clean up floor spills.  Remove throw rugs or fasten them to the floor with carpet tape.  Do not place electrical cords across pathways.  Put grab bars by your tub, shower, and toilet. Do not use towel bars as grab bars.  Put handrails on both sides of the stairway. Fix loose handrails.  Do not climb on stools or stepladders, if possible.  Do not wax your floors.  Repair uneven or unsafe sidewalks, walkways, or stairs.  Keep items you use a lot within reach.  Be aware of pets.  Keep emergency numbers next to the telephone.  Put smoke detectors in your home and near bedrooms. Ask your doctor what other things you can do to prevent falls. Document Released: 04/03/2009 Document Revised: 12/07/2011 Document Reviewed: 09/07/2011 [facility] Patient Information 2015 [place], [place]. This information is not intended to replace advice given to you by your health care provider. Make sure you discuss any questions you have with your health care provider.

## 2014-11-04 NOTE — Progress Notes (Signed)
Physical Therapy Treatment Patient Details Name: Valerie Chen MRN: [id number] DOB: [birth year] Today's Date: 11/04/2014    History of Present Illness Valerie Chen is a 80 y.o. female presenting with transient episode of b/l LE weakness,decreased visio. PMH is significant for Dementia, CAD, HTN, chronic chest pain. LE edema. No significant finding on MRI. 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    PT Comments    Pt functioning at baseline from mobility stand point. Pt's daughter requested cervical neck stretches/exercises due to recently found arthritis. Instructed daughter and she was able to return demonstrate good technique on her mother. Pt safe to d/c home when cleared by MD.   Follow Up Recommendations  No PT follow up;Supervision/Assistance - 24 hour     Equipment Recommendations  None recommended by PT    Recommendations for Other Services       Precautions / Restrictions Precautions Precautions: Fall Restrictions Weight Bearing Restrictions: No    Mobility  Bed Mobility Overal bed mobility:  (pt up in chair)                Transfers Overall transfer level: Needs assistance Equipment used: 1 person hand held assist Transfers: Sit to/from Stand Sit to Stand: Min guard         General transfer comment: pt with safe technique, slow/cautious  Ambulation/Gait Ambulation/Gait assistance: Min guard Ambulation Distance (Feet): 200 Feet Assistive device: 1 person hand held assist Gait Pattern/deviations: WFL(Within Functional Limits)   Gait velocity interpretation: at or above normal speed for age/gender General Gait Details: pt held onto PTs hand but didn't use it   Stairs            Wheelchair Mobility    Modified Rankin (Stroke Patients Only)       Balance Overall balance assessment: No apparent balance deficits (not formally assessed)                                  Cognition  Arousal/Alertness: Awake/alert Behavior During Therapy: WFL for tasks assessed/performed Overall Cognitive Status: History of cognitive impairments - at baseline                      Exercises Other Exercises Other Exercises: cervical stretches into flexion, R/L side bending, and R/L rotation. Daughter with good return demonstration. instructed to complete 3x for 30-60sec hold, 1-2/day. also instruct daughter on cervical retraction via guiding patient at jaw bone with one hand and placing other hand on base of occiput    General Comments        Pertinent Vitals/Pain Pain Assessment: No/denies pain    Home Living                      Prior Function            PT Goals (current goals can now be found in the care plan section) Progress towards PT goals: Progressing toward goals    Frequency  Min 2X/week    PT Plan Frequency needs to be updated    Co-evaluation             End of Session Equipment Utilized During Treatment: Gait belt Activity Tolerance: Patient tolerated treatment well Patient left: in chair;with call bell/phone within reach;with chair alarm set     Time: 3559-7416 PT Time Calculation (min) (ACUTE ONLY): 27 min  Charges:  $Gait Training: 8-22 mins $Therapeutic Exercise: 8-22 mins                    G Codes:      Kingsley Callander 11/04/2014, 10:16 AM   Kittie Plater, PT, DPT Pager #: [phone] Office #: [phone]

## 2014-11-04 NOTE — Progress Notes (Signed)
Discharge instructions reviewed with family/patient. Daughter still appears unsatisfied with AVS r/t MD "not listening" to the requests that they recommend for their mother (the patient). Writer explained some of the requests can be discussed with PCP after discharge. Family wants to speak with Ass. Director and MD prior to leaving. ADN and MD notified.   Ave Filter, RN

## 2014-11-20 ENCOUNTER — Encounter: Payer: Self-pay | Admitting: Internal Medicine

## 2014-11-20 ENCOUNTER — Encounter: Payer: Medicare Other | Admitting: Internal Medicine

## 2015-01-18 ENCOUNTER — Emergency Department (HOSPITAL_COMMUNITY): Payer: Medicare Other

## 2015-01-18 ENCOUNTER — Emergency Department (HOSPITAL_COMMUNITY)
Admission: EM | Admit: 2015-01-18 | Discharge: 2015-01-18 | Disposition: A | Discharge status: Home / Self Care | Payer: Medicare Other | Attending: Emergency Medicine | Admitting: Emergency Medicine

## 2015-01-18 ENCOUNTER — Encounter (HOSPITAL_COMMUNITY): Payer: Self-pay | Admitting: Emergency Medicine

## 2015-01-18 DIAGNOSIS — Z66 Do not resuscitate: Secondary | ICD-10-CM | POA: Diagnosis not present

## 2015-01-18 DIAGNOSIS — R443 Hallucinations, unspecified: Secondary | ICD-10-CM | POA: Diagnosis present

## 2015-01-18 DIAGNOSIS — I2510 Atherosclerotic heart disease of native coronary artery without angina pectoris: Secondary | ICD-10-CM | POA: Diagnosis not present

## 2015-01-18 DIAGNOSIS — G309 Alzheimer's disease, unspecified: Secondary | ICD-10-CM | POA: Diagnosis not present

## 2015-01-18 DIAGNOSIS — M6250 Muscle wasting and atrophy, not elsewhere classified, unspecified site: Secondary | ICD-10-CM | POA: Insufficient documentation

## 2015-01-18 DIAGNOSIS — Z792 Long term (current) use of antibiotics: Secondary | ICD-10-CM | POA: Insufficient documentation

## 2015-01-18 DIAGNOSIS — R410 Disorientation, unspecified: Secondary | ICD-10-CM

## 2015-01-18 DIAGNOSIS — Z7982 Long term (current) use of aspirin: Secondary | ICD-10-CM | POA: Diagnosis not present

## 2015-01-18 DIAGNOSIS — M1990 Unspecified osteoarthritis, unspecified site: Secondary | ICD-10-CM | POA: Diagnosis not present

## 2015-01-18 DIAGNOSIS — Z8673 Personal history of transient ischemic attack (TIA), and cerebral infarction without residual deficits: Secondary | ICD-10-CM | POA: Insufficient documentation

## 2015-01-18 DIAGNOSIS — Z79899 Other long term (current) drug therapy: Secondary | ICD-10-CM | POA: Diagnosis not present

## 2015-01-18 DIAGNOSIS — Z8639 Personal history of other endocrine, nutritional and metabolic disease: Secondary | ICD-10-CM | POA: Diagnosis not present

## 2015-01-18 DIAGNOSIS — F0280 Dementia in other diseases classified elsewhere without behavioral disturbance: Secondary | ICD-10-CM | POA: Insufficient documentation

## 2015-01-18 LAB — ETHANOL

## 2015-01-18 LAB — COMPREHENSIVE METABOLIC PANEL
ALT: 17 U/L (ref 14–54)
ANION GAP: 7 (ref 5–15)
AST: 23 U/L (ref 15–41)
Albumin: 4.1 g/dL (ref 3.5–5.0)
Alkaline Phosphatase: 62 U/L (ref 38–126)
BUN: 14 mg/dL (ref 6–20)
CO2: 29 mmol/L (ref 22–32)
Calcium: 9.9 mg/dL (ref 8.9–10.3)
Chloride: 104 mmol/L (ref 101–111)
Creatinine, Ser: 0.78 mg/dL (ref 0.44–1.00)
GFR calc non Af Amer: >60 mL/min (ref >60)
GLUCOSE: 99 mg/dL (ref 65–99)
POTASSIUM: 4.1 mmol/L (ref 3.5–5.1)
Sodium: 140 mmol/L (ref 135–145)
Total Bilirubin: 0.7 mg/dL (ref 0.3–1.2)
Total Protein: 7.2 g/dL (ref 6.5–8.1)

## 2015-01-18 LAB — CBC WITH DIFFERENTIAL/PLATELET
BASOS ABS: 0 10*3/uL (ref 0.0–0.1)
BASOS PCT: 0 % (ref 0–1)
EOS PCT: 0 % (ref 0–5)
Eosinophils Absolute: 0 10*3/uL (ref 0.0–0.7)
HCT: 46.4 % — ABNORMAL HIGH (ref 36.0–46.0)
Hemoglobin: 15.7 g/dL — ABNORMAL HIGH (ref 12.0–15.0)
LYMPHS ABS: 1.4 10*3/uL (ref 0.7–4.0)
LYMPHS PCT: 23 % (ref 12–46)
MCH: 30.9 pg (ref 26.0–34.0)
MCHC: 33.8 g/dL (ref 30.0–36.0)
MCV: 91.3 fL (ref 78.0–100.0)
Monocytes Absolute: 0.5 10*3/uL (ref 0.1–1.0)
Monocytes Relative: 8 % (ref 3–12)
NEUTROS ABS: 4 10*3/uL (ref 1.7–7.7)
Neutrophils Relative %: 69 % (ref 43–77)
PLATELETS: 185 10*3/uL (ref 150–400)
RBC: 5.08 MIL/uL (ref 3.87–5.11)
RDW: 12.8 % (ref 11.5–15.5)
WBC: 5.9 10*3/uL (ref 4.0–10.5)

## 2015-01-18 LAB — URINALYSIS, ROUTINE W REFLEX MICROSCOPIC
Bilirubin Urine: NEGATIVE
Glucose, UA: NEGATIVE mg/dL
Hgb urine dipstick: NEGATIVE
KETONES UR: NEGATIVE mg/dL
Leukocytes, UA: NEGATIVE
NITRITE: NEGATIVE
Protein, ur: NEGATIVE mg/dL
Specific Gravity, Urine: 1.007 (ref 1.005–1.030)
Urobilinogen, UA: 0.2 mg/dL (ref 0.0–1.0)
pH: 7 (ref 5.0–8.0)

## 2015-01-18 LAB — TROPONIN I: Troponin I: <0.03 ng/mL (ref <0.031)

## 2015-01-18 LAB — I-STAT CG4 LACTIC ACID, ED: Lactic Acid, Venous: 1.14 mmol/L (ref 0.5–2.0)

## 2015-01-18 LAB — LIPASE, BLOOD: Lipase: 23 U/L (ref 22–51)

## 2015-01-18 MED ORDER — SODIUM CHLORIDE 0.9 % IV BOLUS (SEPSIS)
500.0000 mL | Freq: Once | INTRAVENOUS | Status: AC
  Administered 2015-01-18: 500 mL via INTRAVENOUS

## 2015-01-18 NOTE — ED Notes (Signed)
Bed: BB04 Expected date: 01/18/15 Expected time: 12:58 PM Means of arrival: Ambulance Comments: 81 yo confused ? UTI

## 2015-01-18 NOTE — ED Notes (Signed)
Patient transported to CT 

## 2015-01-18 NOTE — Discharge Instructions (Signed)
As discussed, your evaluation today has been largely reassuring.  But, it is important that you monitor your condition carefully, and do not hesitate to return to the ED if you develop new, or concerning changes in your condition. ? ?Otherwise, please follow-up with your physician for appropriate ongoing care. ? ?

## 2015-01-18 NOTE — ED Notes (Signed)
Delay in lab draw, pt not in room 

## 2015-01-18 NOTE — ED Notes (Signed)
PER EMS- pt picked up from urgent care c/o hallucinations x2weeks. Pt has hx of dementia.  Has been seen over the past 4weeks c/o UTI at multiple urgent care.  UTI's have been negative.  Caregiver isn't satisfied with results at from urgent care and wants further testing completed.  Pt denies pain, urinary symptoms, or N/V.  Pt alert and able to follow commands.  Caregiver  reports pt is alert and oriented per baseline but hallucinations have worsened over the past 2days.  Daughter stated she will meet pt here and caregiver en route.

## 2015-01-18 NOTE — ED Provider Notes (Signed)
CSN: [id number]     Arrival date & time 01/18/15  1309 History   First MD Initiated Contact with Patient 01/18/15 1324     Chief Complaint  Patient presents with  . Hallucinations     (Consider location/radiation/quality/duration/timing/severity/associated sxs/prior Treatment) HPI Patient presents with familial concerns of increasing confusion and increasingly frequent episodes of atypical behavior. Patient has history of dementia. Over the past 2 weeks in particular she has had increasingly frequent periods of confusion, beyond baseline as well as changes in behavior. No report of new fever, chills, vomiting. Patient herself denies any pain or discomfort. She is however, limited cognitively S2 this presentation, level V caveat. Patient's family members provide the history of present illness. He states that she had one similar.  Exacerbations about 3 months ago, with Hospital physician for several days, without clear diagnosis. Over the past 2 weeks as this episode has been persistent, she has been evaluated multiple physicians office, has had inconsistent abnormalities in urinalysis, no frank evidence of infection. There have been no recent medication changes, no dietary changes no activity changes.    Past Medical History  Diagnosis Date  . Chest pain     Hospital February, 2012, nuclear, possible anteroseptal and inferoseptal ischemia, technically difficult, medical therapy  . Dyslipidemia   . Orthostatic hypotension   . Migraines     27 year  . Dementia     Significant  . Ejection fraction     EF 55%, echo, February, 2012  //   EF 55-60%, echo, February 25, 2012  . Diastolic dysfunction     Echo, February, 2012  . TIA (transient ischemic attack)        ???question of a TIA in February, 2012, carotid Doppler, August 11, 2010 no significant abnormalities  . GI bleed     February, 2012 incomplete colonoscopy, hemorrhoids, needs complete colonoscopy  . CAD (coronary artery  disease)     Probable CAD, nuclear February, 2012, possible anteroseptal and inferoseptal ischemia but the study was technically difficult  . Hyperlipidemia   . Edema     hospitalizations September, 2013, some fluid overload  . DNR (do not resuscitate)   . Major depression     2 overdoses in 1975 & 1977  . Arthritis   . Alzheimer disease    Past Surgical History  Procedure Laterality Date  . Abdominal hysterectomy    . Diagnostic mammogram  12/29/2000    Mammogram, suspicious lesions -BX pending  . Tonsillectomy     Family History  Problem Relation Age of Onset  . Heart attack Father     several heart attacks  . Hypertension Daughter   . Cancer Father   . Suicidality Mother   . Heart attack Brother   . Heart disease Brother   . Pneumonia Brother   . Suicidality Son   . Arthritis Father   . Depression Other     Most family members   History  Substance Use Topics  . Smoking status: Never Smoker   . Smokeless tobacco: Never Used  . Alcohol Use: No   OB History    No data available     Review of Systems  Unable to perform ROS: Dementia      Allergies  Review of patient's allergies indicates no known allergies.  Home Medications   Prior to Admission medications   Medication Sig Start Date End Date Taking? Authorizing Provider  acetaminophen (TYLENOL) 325 MG tablet Take 2 tablets (650 mg total) by  mouth every 6 (six) hours as needed for mild pain (or Fever >/= 101). 11/04/14   Ashly Windell Moulding, DO  amLODipine (NORVASC) 5 MG tablet Take 5 mg by mouth every morning.    Historical Provider, MD  amoxicillin (AMOXIL) 250 MG capsule Take 250 mg by mouth daily.    Historical Provider, MD  aspirin EC 81 MG tablet Take 81 mg by mouth every morning.    Historical Provider, MD  CRANBERRY EXTRACT PO Take 3,600 mg by mouth daily.    Historical Provider, MD  cyanocobalamin 500 MCG tablet Take 500 mcg by mouth daily.    Historical Provider, MD  docusate sodium (COLACE) 100  MG capsule Take 200 mg by mouth 2 (two) times daily.    Historical Provider, MD  furosemide (LASIX) 40 MG tablet Take 40 mg by mouth every morning. 04/05/12   Liliane Shi, PA-C  isosorbide mononitrate (IMDUR) 30 MG 24 hr tablet TAKE 1/2 TABLET BY MOUTH DAILY CUT FOR PATIENT 04/27/13   Carlena Bjornstad, MD  metoprolol tartrate (LOPRESSOR) 12.5 mg TABS tablet Take 0.5 tablets (12.5 mg total) by mouth 2 (two) times daily. 03/30/13   Carlena Bjornstad, MD  mirtazapine (REMERON) 30 MG tablet Take 30 mg by mouth at bedtime.    Historical Provider, MD  potassium chloride SA (K-DUR,KLOR-CON) 20 MEQ tablet 2 by mouth daily 11/20/14   Gildardo Cranker, DO  pravastatin (PRAVACHOL) 20 MG tablet 1/2 by mouth daily    Historical Provider, MD  QUEtiapine (SEROQUEL) 25 MG tablet Take 25 mg by mouth daily. Takes 1 tablet daily    Historical Provider, MD   BP 111/58 mmHg  Pulse 55  Temp(Src) 98.3 F (36.8 C) (Oral)  Resp 18  SpO2 100% Physical Exam  Constitutional: She is oriented to person, place, and time. She appears well-developed and well-nourished. No distress.  HENT:  Head: Normocephalic and atraumatic.  Eyes: Conjunctivae and EOM are normal.  Cardiovascular: Normal rate and regular rhythm.   Pulmonary/Chest: Effort normal and breath sounds normal. No stridor. No respiratory distress.  Abdominal: She exhibits no distension.  Musculoskeletal: She exhibits no edema.  Neurological: She is alert and oriented to person, place, and time. She displays atrophy. She displays no tremor. No cranial nerve deficit. She exhibits abnormal muscle tone. She displays no seizure activity. Coordination normal.  Moves all extremities spontaneously, is awake, alert, appropriately interactive, but has limited insight into this presentation.  Skin: Skin is warm and dry.  Psychiatric: Her speech is delayed. She is slowed and withdrawn. Cognition and memory are impaired.  Nursing note and vitals reviewed.   ED Course  Procedures  (including critical care time) Labs Review Labs Reviewed  CBC WITH DIFFERENTIAL/PLATELET - Abnormal; Notable for the following:    Hemoglobin 15.7 (*)    HCT 46.4 (*)    All other components within normal limits  COMPREHENSIVE METABOLIC PANEL  LIPASE, BLOOD  URINALYSIS, ROUTINE W REFLEX MICROSCOPIC (NOT AT [facility])  TROPONIN I  ETHANOL  I-STAT CG4 LACTIC ACID, ED    Imaging Review Ct Head Wo Contrast  01/18/2015   CLINICAL DATA:  81 year old female with altered mental status and hallucinations.  EXAM: CT HEAD WITHOUT CONTRAST  TECHNIQUE: Contiguous axial images were obtained from the base of the skull through the vertex without intravenous contrast.  COMPARISON:  11/01/2014 and prior head CTs  FINDINGS: Chronic small-vessel white matter ischemic changes are identified.  No acute intracranial abnormalities are identified, including mass lesion or mass  effect, hydrocephalus, extra-axial fluid collection, midline shift, hemorrhage, or acute infarction.  The visualized bony calvarium is unremarkable.  IMPRESSION: No evidence of acute intracranial abnormality.  Chronic small-vessel white matter ischemic changes.   Electronically Signed   By: Margarette Canada M.D.   On: 01/18/2015 13:52     EKG Interpretation   Date/Time:  Saturday January 18 2015 14:23:45 EDT Ventricular Rate:  58 PR Interval:    QRS Duration: 84 QT Interval:  438 QTC Calculation: 430 R Axis:   88 Text Interpretation:  Junctional rhythm Borderline right axis deviation  Low voltage, precordial leads Sinus rhythm Artifact Abnormal ekg Confirmed  by Carmin Muskrat  MD ([id number]) on 01/18/2015 3:15:47 PM      Pulse oximetry 100% room air normal Cardiac 60 sinus normal  I reviewed the patient's chart from outside facilities over the past week, visits notable for abnormal urinalysis, though without frank UTI. Patient discharge summary from 3 months ago demonstrated confusion as final diagnosis, no evidence for acute new stroke, or  other acute systemic pathology.   3:52 PM Patient in no distress, appears calm. I had a lengthy conversation with the patient's family members about all findings, her recent evaluations, concern for progression of disease versus medication effects. Patient will follow up with her outpatient psychiatrist.   MDM  History presents with confusion. Here the patient is awake, alert, hemodynamically stable, though cognitively impaired. Patient has no evidence for new neurological loss, nor occult infection. With reassuring findings, the patient was discharged to follow up with psychiatry as an outpatient.  Carmin Muskrat, MD 01/18/15 [phone]

## 2015-01-27 NOTE — Progress Notes (Signed)
This encounter was created in error - please disregard.

## 2015-02-01 ENCOUNTER — Emergency Department (HOSPITAL_COMMUNITY): Payer: Medicare Other

## 2015-02-01 ENCOUNTER — Inpatient Hospital Stay (HOSPITAL_COMMUNITY)
Admission: EM | Admit: 2015-02-01 | Discharge: 2015-02-05 | DRG: 195 | Disposition: A | Discharge status: Home / Self Care | Payer: Medicare Other | Attending: Internal Medicine | Admitting: Internal Medicine

## 2015-02-01 ENCOUNTER — Encounter (HOSPITAL_COMMUNITY): Payer: Self-pay | Admitting: Emergency Medicine

## 2015-02-01 DIAGNOSIS — Z66 Do not resuscitate: Secondary | ICD-10-CM | POA: Diagnosis present

## 2015-02-01 DIAGNOSIS — Z87440 Personal history of urinary (tract) infections: Secondary | ICD-10-CM | POA: Diagnosis not present

## 2015-02-01 DIAGNOSIS — Z8673 Personal history of transient ischemic attack (TIA), and cerebral infarction without residual deficits: Secondary | ICD-10-CM

## 2015-02-01 DIAGNOSIS — I2510 Atherosclerotic heart disease of native coronary artery without angina pectoris: Secondary | ICD-10-CM | POA: Diagnosis present

## 2015-02-01 DIAGNOSIS — E785 Hyperlipidemia, unspecified: Secondary | ICD-10-CM | POA: Diagnosis present

## 2015-02-01 DIAGNOSIS — J189 Pneumonia, unspecified organism: Secondary | ICD-10-CM | POA: Diagnosis not present

## 2015-02-01 DIAGNOSIS — F068 Other specified mental disorders due to known physiological condition: Secondary | ICD-10-CM | POA: Diagnosis present

## 2015-02-01 DIAGNOSIS — F0390 Unspecified dementia without behavioral disturbance: Secondary | ICD-10-CM | POA: Diagnosis not present

## 2015-02-01 DIAGNOSIS — Y95 Nosocomial condition: Secondary | ICD-10-CM | POA: Diagnosis present

## 2015-02-01 DIAGNOSIS — R509 Fever, unspecified: Secondary | ICD-10-CM | POA: Diagnosis not present

## 2015-02-01 DIAGNOSIS — I10 Essential (primary) hypertension: Secondary | ICD-10-CM | POA: Diagnosis present

## 2015-02-01 DIAGNOSIS — N390 Urinary tract infection, site not specified: Secondary | ICD-10-CM | POA: Diagnosis present

## 2015-02-01 DIAGNOSIS — R0902 Hypoxemia: Secondary | ICD-10-CM | POA: Diagnosis not present

## 2015-02-01 DIAGNOSIS — F0280 Dementia in other diseases classified elsewhere without behavioral disturbance: Secondary | ICD-10-CM | POA: Diagnosis present

## 2015-02-01 DIAGNOSIS — G309 Alzheimer's disease, unspecified: Secondary | ICD-10-CM | POA: Diagnosis present

## 2015-02-01 DIAGNOSIS — M1990 Unspecified osteoarthritis, unspecified site: Secondary | ICD-10-CM | POA: Diagnosis present

## 2015-02-01 DIAGNOSIS — Z8249 Family history of ischemic heart disease and other diseases of the circulatory system: Secondary | ICD-10-CM

## 2015-02-01 LAB — CBC WITH DIFFERENTIAL/PLATELET
BASOS ABS: 0 10*3/uL (ref 0.0–0.1)
BASOS PCT: 0 % (ref 0–1)
EOS ABS: 0 10*3/uL (ref 0.0–0.7)
Eosinophils Relative: 0 % (ref 0–5)
HCT: 39.2 % (ref 36.0–46.0)
HEMOGLOBIN: 13.4 g/dL (ref 12.0–15.0)
LYMPHS ABS: 0.9 10*3/uL (ref 0.7–4.0)
Lymphocytes Relative: 12 % (ref 12–46)
MCH: 31.3 pg (ref 26.0–34.0)
MCHC: 34.2 g/dL (ref 30.0–36.0)
MCV: 91.6 fL (ref 78.0–100.0)
MONO ABS: 0.5 10*3/uL (ref 0.1–1.0)
MONOS PCT: 7 % (ref 3–12)
Neutro Abs: 5.9 10*3/uL (ref 1.7–7.7)
Neutrophils Relative %: 81 % — ABNORMAL HIGH (ref 43–77)
Platelets: 139 10*3/uL — ABNORMAL LOW (ref 150–400)
RBC: 4.28 MIL/uL (ref 3.87–5.11)
RDW: 12.7 % (ref 11.5–15.5)
WBC: 7.3 10*3/uL (ref 4.0–10.5)

## 2015-02-01 LAB — COMPREHENSIVE METABOLIC PANEL
ALK PHOS: 53 U/L (ref 38–126)
ALT: 14 U/L (ref 14–54)
AST: 16 U/L (ref 15–41)
Albumin: 3.3 g/dL — ABNORMAL LOW (ref 3.5–5.0)
Anion gap: 9 (ref 5–15)
BUN: 11 mg/dL (ref 6–20)
CO2: 22 mmol/L (ref 22–32)
Calcium: 8.3 mg/dL — ABNORMAL LOW (ref 8.9–10.3)
Chloride: 106 mmol/L (ref 101–111)
Creatinine, Ser: 0.72 mg/dL (ref 0.44–1.00)
GFR calc non Af Amer: >60 mL/min (ref >60)
Glucose, Bld: 119 mg/dL — ABNORMAL HIGH (ref 65–99)
POTASSIUM: 3.6 mmol/L (ref 3.5–5.1)
SODIUM: 137 mmol/L (ref 135–145)
TOTAL PROTEIN: 5.9 g/dL — AB (ref 6.5–8.1)
Total Bilirubin: 0.7 mg/dL (ref 0.3–1.2)

## 2015-02-01 LAB — URINALYSIS, ROUTINE W REFLEX MICROSCOPIC
BILIRUBIN URINE: NEGATIVE
Glucose, UA: NEGATIVE mg/dL
Hgb urine dipstick: NEGATIVE
Ketones, ur: NEGATIVE mg/dL
Leukocytes, UA: NEGATIVE
NITRITE: NEGATIVE
Protein, ur: NEGATIVE mg/dL
Specific Gravity, Urine: 1.019 (ref 1.005–1.030)
Urobilinogen, UA: 1 mg/dL (ref 0.0–1.0)
pH: 7.5 (ref 5.0–8.0)

## 2015-02-01 LAB — I-STAT CG4 LACTIC ACID, ED
Lactic Acid, Venous: 1.2 mmol/L (ref 0.5–2.0)
Lactic Acid, Venous: 1.46 mmol/L (ref 0.5–2.0)

## 2015-02-01 MED ORDER — DEXTROSE 5 % IV SOLN
1.0000 g | Freq: Once | INTRAVENOUS | Status: AC
  Administered 2015-02-01: 1 g via INTRAVENOUS
  Filled 2015-02-01: qty 10

## 2015-02-01 MED ORDER — METOPROLOL TARTRATE 12.5 MG HALF TABLET
12.5000 mg | ORAL_TABLET | Freq: Two times a day (BID) | ORAL | Status: DC
  Administered 2015-02-02 – 2015-02-05 (×7): 12.5 mg via ORAL
  Filled 2015-02-01 (×8): qty 1

## 2015-02-01 MED ORDER — DEXTROSE 5 % IV SOLN
2.0000 g | Freq: Three times a day (TID) | INTRAVENOUS | Status: DC
  Administered 2015-02-01 – 2015-02-04 (×9): 2 g via INTRAVENOUS
  Filled 2015-02-01 (×10): qty 2

## 2015-02-01 MED ORDER — QUETIAPINE FUMARATE 25 MG PO TABS
25.0000 mg | ORAL_TABLET | Freq: Every day | ORAL | Status: DC
  Administered 2015-02-01 – 2015-02-05 (×5): 25 mg via ORAL
  Filled 2015-02-01 (×5): qty 1

## 2015-02-01 MED ORDER — ONDANSETRON HCL 4 MG PO TABS: 4.0000 mg | ORAL_TABLET | Freq: Four times a day (QID) | ORAL | Status: DC | PRN

## 2015-02-01 MED ORDER — SACCHAROMYCES BOULARDII 250 MG PO CAPS
250.0000 mg | ORAL_CAPSULE | Freq: Two times a day (BID) | ORAL | Status: DC
  Administered 2015-02-01 – 2015-02-05 (×9): 250 mg via ORAL
  Filled 2015-02-01 (×10): qty 1

## 2015-02-01 MED ORDER — TAMSULOSIN HCL 0.4 MG PO CAPS
0.4000 mg | ORAL_CAPSULE | Freq: Every day | ORAL | Status: DC
  Administered 2015-02-01 – 2015-02-05 (×5): 0.4 mg via ORAL
  Filled 2015-02-01 (×5): qty 1

## 2015-02-01 MED ORDER — SODIUM CHLORIDE 0.9 % IV SOLN
INTRAVENOUS | Status: DC
  Administered 2015-02-01: 13:00:00 via INTRAVENOUS

## 2015-02-01 MED ORDER — CYANOCOBALAMIN 500 MCG PO TABS
500.0000 ug | ORAL_TABLET | Freq: Every day | ORAL | Status: DC
  Administered 2015-02-01 – 2015-02-05 (×5): 500 ug via ORAL
  Filled 2015-02-01 (×5): qty 1

## 2015-02-01 MED ORDER — POTASSIUM CHLORIDE CRYS ER 20 MEQ PO TBCR
40.0000 meq | EXTENDED_RELEASE_TABLET | Freq: Every day | ORAL | Status: DC
  Administered 2015-02-01 – 2015-02-05 (×5): 40 meq via ORAL
  Filled 2015-02-01 (×5): qty 2

## 2015-02-01 MED ORDER — ENOXAPARIN SODIUM 40 MG/0.4ML ~~LOC~~ SOLN
40.0000 mg | SUBCUTANEOUS | Status: DC
  Administered 2015-02-01 – 2015-02-04 (×4): 40 mg via SUBCUTANEOUS
  Filled 2015-02-01 (×5): qty 0.4

## 2015-02-01 MED ORDER — SODIUM CHLORIDE 0.9 % IV BOLUS (SEPSIS)
1000.0000 mL | Freq: Once | INTRAVENOUS | Status: AC
  Administered 2015-02-01: 1000 mL via INTRAVENOUS

## 2015-02-01 MED ORDER — VANCOMYCIN HCL IN DEXTROSE 750-5 MG/150ML-% IV SOLN
750.0000 mg | Freq: Two times a day (BID) | INTRAVENOUS | Status: DC
  Administered 2015-02-01 – 2015-02-04 (×6): 750 mg via INTRAVENOUS
  Filled 2015-02-01 (×7): qty 150

## 2015-02-01 MED ORDER — AMLODIPINE BESYLATE 5 MG PO TABS
5.0000 mg | ORAL_TABLET | Freq: Every morning | ORAL | Status: DC
  Administered 2015-02-01 – 2015-02-05 (×5): 5 mg via ORAL
  Filled 2015-02-01 (×5): qty 1

## 2015-02-01 MED ORDER — MIRTAZAPINE 30 MG PO TABS
30.0000 mg | ORAL_TABLET | Freq: Every day | ORAL | Status: DC
  Administered 2015-02-01 – 2015-02-04 (×4): 30 mg via ORAL
  Filled 2015-02-01 (×5): qty 1

## 2015-02-01 MED ORDER — DEXTROSE 5 % IV SOLN: 1.0000 g | INTRAVENOUS | Status: DC

## 2015-02-01 MED ORDER — ACETAMINOPHEN 650 MG RE SUPP: 650.0000 mg | Freq: Four times a day (QID) | RECTAL | Status: DC | PRN

## 2015-02-01 MED ORDER — ISOSORBIDE MONONITRATE ER 30 MG PO TB24
30.0000 mg | ORAL_TABLET | Freq: Every day | ORAL | Status: DC
  Administered 2015-02-01 – 2015-02-05 (×5): 30 mg via ORAL
  Filled 2015-02-01 (×5): qty 1

## 2015-02-01 MED ORDER — DOCUSATE SODIUM 100 MG PO CAPS
200.0000 mg | ORAL_CAPSULE | Freq: Two times a day (BID) | ORAL | Status: DC
  Administered 2015-02-01 – 2015-02-02 (×4): 200 mg via ORAL
  Filled 2015-02-01 (×6): qty 2

## 2015-02-01 MED ORDER — ONDANSETRON HCL 4 MG/2ML IJ SOLN: 4.0000 mg | Freq: Four times a day (QID) | INTRAMUSCULAR | Status: DC | PRN

## 2015-02-01 MED ORDER — AZITHROMYCIN 500 MG IV SOLR: 500.0000 mg | INTRAVENOUS | Status: DC

## 2015-02-01 MED ORDER — PRAVASTATIN SODIUM 10 MG PO TABS
10.0000 mg | ORAL_TABLET | Freq: Every day | ORAL | Status: DC
  Administered 2015-02-01 – 2015-02-04 (×4): 10 mg via ORAL
  Filled 2015-02-01 (×5): qty 1

## 2015-02-01 MED ORDER — ACETAMINOPHEN 325 MG PO TABS: 650.0000 mg | ORAL_TABLET | Freq: Four times a day (QID) | ORAL | Status: DC | PRN

## 2015-02-01 MED ORDER — ASPIRIN EC 81 MG PO TBEC
81.0000 mg | DELAYED_RELEASE_TABLET | Freq: Every morning | ORAL | Status: DC
  Administered 2015-02-01 – 2015-02-05 (×5): 81 mg via ORAL
  Filled 2015-02-01 (×5): qty 1

## 2015-02-01 MED ORDER — DEXTROSE 5 % IV SOLN
500.0000 mg | Freq: Once | INTRAVENOUS | Status: AC
  Administered 2015-02-01: 500 mg via INTRAVENOUS
  Filled 2015-02-01: qty 500

## 2015-02-01 NOTE — ED Notes (Signed)
Bed: VG71 Expected date: 02/01/15 Expected time: 7:47 AM Means of arrival: Ambulance Comments: 81 yo

## 2015-02-01 NOTE — ED Notes (Signed)
Delay in lab draw, pt receiving peri care. 

## 2015-02-01 NOTE — ED Provider Notes (Signed)
CSN: [id number]     Arrival date & time 02/01/15  0800 History   First MD Initiated Contact with Patient 02/01/15 [phone]     Chief Complaint  Patient presents with  . Fever     (Consider location/radiation/quality/duration/timing/severity/associated sxs/prior Treatment) HPI Comments: 81 year old female with past medical history including dementia, CAD, hypertension, hyperlipidemia, TIA who presents with confusion and fever. History obtained with the assistance of the patient's daughter and caregiver who are at bedside. Daughter states that patient began having symptoms of urinary tract infection 3 weeks ago, she has been to several facilities for these symptoms and was started on 8/4 on treatment with Macrobid and Flomax by her urologist. Wilburn Mylar, she began having increased confusion from her baseline dementia. This morning she spiked a fever to 101.2 at home. Yesterday afternoon she was complaining of some low back pain but they have not noticed any hematuria. She has had no vomiting or diarrhea and had a normal bowel movement today. She has occasional cough and nasal congestion but no other respiratory symptoms. No other complaints of pain.  Patient is a 81 y.o. female presenting with fever. The history is provided by a relative and a caregiver. The history is limited by the condition of the patient.  Fever   Past Medical History  Diagnosis Date  . Chest pain     Hospital February, 2012, nuclear, possible anteroseptal and inferoseptal ischemia, technically difficult, medical therapy  . Dyslipidemia   . Orthostatic hypotension   . Migraines     25 year  . Dementia     Significant  . Ejection fraction     EF 55%, echo, February, 2012  //   EF 55-60%, echo, February 25, 2012  . Diastolic dysfunction     Echo, February, 2012  . TIA (transient ischemic attack)        ???question of a TIA in February, 2012, carotid Doppler, August 11, 2010 no significant abnormalities  . GI bleed    February, 2012 incomplete colonoscopy, hemorrhoids, needs complete colonoscopy  . CAD (coronary artery disease)     Probable CAD, nuclear February, 2012, possible anteroseptal and inferoseptal ischemia but the study was technically difficult  . Hyperlipidemia   . Edema     hospitalizations September, 2013, some fluid overload  . DNR (do not resuscitate)   . Major depression     2 overdoses in 1975 & 1977  . Arthritis   . Alzheimer disease    Past Surgical History  Procedure Laterality Date  . Abdominal hysterectomy    . Diagnostic mammogram  12/29/2000    Mammogram, suspicious lesions -BX pending  . Tonsillectomy     Family History  Problem Relation Age of Onset  . Heart attack Father     several heart attacks  . Hypertension Daughter   . Cancer Father   . Suicidality Mother   . Heart attack Brother   . Heart disease Brother   . Pneumonia Brother   . Suicidality Son   . Arthritis Father   . Depression Other     Most family members   Social History  Substance Use Topics  . Smoking status: Never Smoker   . Smokeless tobacco: Never Used  . Alcohol Use: No   OB History    No data available     Review of Systems  Constitutional: Positive for fever.      Allergies  Review of patient's allergies indicates no known allergies.  Home Medications   Prior  to Admission medications   Medication Sig Start Date End Date Taking? Authorizing Provider  acetaminophen (TYLENOL) 325 MG tablet Take 2 tablets (650 mg total) by mouth every 6 (six) hours as needed for mild pain (or Fever >/= 101). 11/04/14  Yes Ashly M Gottschalk, DO  amLODipine (NORVASC) 5 MG tablet Take 5 mg by mouth every morning.   Yes Historical Provider, MD  aspirin EC 81 MG tablet Take 81 mg by mouth every morning.   Yes Historical Provider, MD  CRANBERRY EXTRACT PO Take 3,600 mg by mouth daily.   Yes Historical Provider, MD  cyanocobalamin 500 MCG tablet Take 500 mcg by mouth daily.   Yes Historical  Provider, MD  docusate sodium (COLACE) 100 MG capsule Take 200 mg by mouth 2 (two) times daily.   Yes Historical Provider, MD  furosemide (LASIX) 40 MG tablet Take 40 mg by mouth every morning. 04/05/12  Yes Liliane Shi, PA-C  isosorbide mononitrate (IMDUR) 30 MG 24 hr tablet TAKE 1/2 TABLET BY MOUTH DAILY CUT FOR PATIENT 04/27/13  Yes Carlena Bjornstad, MD  metoprolol tartrate (LOPRESSOR) 12.5 mg TABS tablet Take 0.5 tablets (12.5 mg total) by mouth 2 (two) times daily. 03/30/13  Yes Carlena Bjornstad, MD  mirtazapine (REMERON) 30 MG tablet Take 30 mg by mouth daily.    Yes Historical Provider, MD  nitrofurantoin (MACRODANTIN) 100 MG capsule Take 100 mg by mouth 2 (two) times daily. Started 10 day supply on 01/23/2015 will complete 02/02/2015   Yes Historical Provider, MD  potassium chloride SA (K-DUR,KLOR-CON) 20 MEQ tablet Take 40 mEq by mouth daily. 2 by mouth daily 11/20/14  Yes Gildardo Cranker, DO  pravastatin (PRAVACHOL) 20 MG tablet Take 10 mg by mouth daily. 1/2 by mouth daily   Yes Historical Provider, MD  QUEtiapine (SEROQUEL) 25 MG tablet Take 25 mg by mouth daily. Takes 1 tablet daily   Yes Historical Provider, MD  tamsulosin (FLOMAX) 0.4 MG CAPS capsule Take 0.4 mg by mouth daily.   Yes Historical Provider, MD   BP 116/50 mmHg  Pulse 70  Temp(Src) 98.3 F (36.8 C) (Oral)  Resp 19  Ht 5\' 4"  (1.626 m)  Wt 184 lb (83.462 kg)  BMI 31.57 kg/m2  SpO2 89% Physical Exam  Constitutional: She appears well-developed and well-nourished. No distress.  HENT:  Head: Normocephalic and atraumatic.  Moist mucous membranes  Eyes: Conjunctivae are normal. Pupils are equal, round, and reactive to light.  Neck: Neck supple.  Cardiovascular: Normal rate, regular rhythm and normal heart sounds.   No murmur heard. Pulmonary/Chest: Effort normal. No respiratory distress. She has no wheezes.  Faint crackles bilateral bases  Abdominal: Soft. Bowel sounds are normal. She exhibits no distension. There is no  tenderness.  Genitourinary:  No CVA tenderness  Musculoskeletal: She exhibits no edema.  Neurological: She is alert.  Fluent speech, oriented to person, follows instructions appropriately  Skin: Skin is warm and dry.  Psychiatric: She has a normal mood and affect.  Nursing note and vitals reviewed.   ED Course  Procedures (including critical care time) Labs Review Labs Reviewed  CBC WITH DIFFERENTIAL/PLATELET - Abnormal; Notable for the following:    Platelets 139 (*)    Neutrophils Relative % 81 (*)    All other components within normal limits  CULTURE, BLOOD (ROUTINE X 2)  CULTURE, BLOOD (ROUTINE X 2)  URINE CULTURE  URINALYSIS, ROUTINE W REFLEX MICROSCOPIC (NOT AT [facility])  COMPREHENSIVE METABOLIC PANEL  I-STAT CG4 LACTIC ACID,  ED    Imaging Review Dg Chest Port 1 View  02/01/2015   CLINICAL DATA:  Fever.  EXAM: PORTABLE CHEST - 1 VIEW  COMPARISON:  April 18, 2013.  FINDINGS: The heart size and mediastinal contours are within normal limits. No pneumothorax or pleural effusion is noted. Mild bibasilar opacities are noted, with right greater than left, concerning for subsegmental atelectasis or possibly pneumonia. The visualized skeletal structures are unremarkable.  IMPRESSION: Mild bibasilar opacities as described above concerning for subsegmental atelectasis or possibly pneumonia. Followup radiographs are recommended to ensure resolution.   Electronically Signed   By: Marijo Conception, M.D.   On: 02/01/2015 08:46   I, Wenda Overland Jaclene Bartelt, personally reviewed and evaluated these images and lab results as part of my medical decision-making.   EKG Interpretation None       Medications  cefTRIAXone (ROCEPHIN) 1 g in dextrose 5 % 50 mL IVPB (1 g Intravenous New Bag/Given 02/01/15 1003)  azithromycin (ZITHROMAX) 500 mg in dextrose 5 % 250 mL IVPB (not administered)  amLODipine (NORVASC) tablet 5 mg (not administered)  isosorbide mononitrate (IMDUR) 24 hr tablet 30 mg (not  administered)  QUEtiapine (SEROQUEL) tablet 25 mg (not administered)  potassium chloride SA (K-DUR,KLOR-CON) CR tablet 40 mEq (not administered)  cefTRIAXone (ROCEPHIN) 1 g in dextrose 5 % 50 mL IVPB (not administered)  azithromycin (ZITHROMAX) 500 mg in dextrose 5 % 250 mL IVPB (not administered)  sodium chloride 0.9 % bolus 1,000 mL (1,000 mLs Intravenous New Bag/Given 02/01/15 1003)     MDM   Final diagnoses:  None   pneumonia, community acquired confusion H/o recent UTI H/o dementia   81 year old female who presents with one day of increased confusion at home as well as fever of 101 today. At presentation, patient was awake, alert, and in no acute distress. Vital signs notable for O2 sat 92-94% on room air. No respiratory distress on exam. Faint crackles in bilateral bases. Patient otherwise answering questions appropriately with no neurologic deficits noted. Obtained above lab work including lactate and gave the patient an IV fluid bolus.  UA shows no evidence of infection and i-STAT is normal. Chest x-ray shows bibasilar opacities. Given the patient's fever and Normal O2 saturation, I suspect that pneumonia is the etiology of her symptoms. She continues to remain around 91% on RA and drop into 80s during sleep, thus she will be placed on 2L [place]. I have given her ceftriaxone and azithromycin as well as some doses of her morning medications. Recent EKG in her chart shows no evidence of QT prolongation. Given the patient's advanced age, hypoxia, and PNA, she will be admitted for further care.  Sharlett Iles, MD 02/01/15 [phone]

## 2015-02-01 NOTE — H&P (Signed)
[facility] History and Physical  Valerie Chen FYB:[id number] DOB: [birth year] DOA: 02/01/2015   PCP: Monico Blitz, MD  Specialists: Followed by a psychiatrist for depression. Followed by Dr. Exie Parody with urology in [facility]  Chief Complaint: Fever, lethargy for past 2 days  HPI: Valerie Chen is a 81 y.o. female with a past medical history of Alzheimer's dementia, coronary artery disease, depression, who has had frequent UTIs for the past 2 weeks. She was hospitalized back in May for a suspected TIA. She has been on multiple courses of antibiotics recently. She is followed by urologist in [facility]. She was placed on nitrofurantoin on August 4 and is supposed to complete her course on August 14. For the past 24-48 hours patient has been having generalized weakness. She's been walking less than normal. History was provided by the daughter who is at bedside. Patient has dementia and unable to provide much information. Overnight, patient had to get up multiple times to urinate, which is unusual. This morning she was found to have a temperature 101F. Daughter has noticed patient to have a dry cough. Patient was noted to have some difficulty with breathing for the last 24 hours. Patient denies any chest pain. There has been no nausea, vomiting, no abdominal pain. She had some lower back pain earlier today but none currently. Daughter does tell me that she had an ultrasound of her genitourinary system recently. Evaluation the emergency department revealed suspected pneumonia. She'll be hospitalized for further management.  Home Medications: Prior to Admission medications   Medication Sig Start Date End Date Taking? Authorizing Provider  acetaminophen (TYLENOL) 325 MG tablet Take 2 tablets (650 mg total) by mouth every 6 (six) hours as needed for mild pain (or Fever >/= 101). 11/04/14  Yes Ashly M Gottschalk, DO  amLODipine (NORVASC) 5 MG tablet Take 5 mg by mouth every morning.    Yes Historical Provider, MD  aspirin EC 81 MG tablet Take 81 mg by mouth every morning.   Yes Historical Provider, MD  CRANBERRY EXTRACT PO Take 3,600 mg by mouth daily.   Yes Historical Provider, MD  cyanocobalamin 500 MCG tablet Take 500 mcg by mouth daily.   Yes Historical Provider, MD  docusate sodium (COLACE) 100 MG capsule Take 200 mg by mouth 2 (two) times daily.   Yes Historical Provider, MD  furosemide (LASIX) 40 MG tablet Take 40 mg by mouth every morning. 04/05/12  Yes Liliane Shi, PA-C  isosorbide mononitrate (IMDUR) 30 MG 24 hr tablet TAKE 1/2 TABLET BY MOUTH DAILY CUT FOR PATIENT 04/27/13  Yes Carlena Bjornstad, MD  metoprolol tartrate (LOPRESSOR) 12.5 mg TABS tablet Take 0.5 tablets (12.5 mg total) by mouth 2 (two) times daily. 03/30/13  Yes Carlena Bjornstad, MD  mirtazapine (REMERON) 30 MG tablet Take 30 mg by mouth daily.    Yes Historical Provider, MD  nitrofurantoin (MACRODANTIN) 100 MG capsule Take 100 mg by mouth 2 (two) times daily. Started 10 day supply on 01/23/2015 will complete 02/02/2015   Yes Historical Provider, MD  potassium chloride SA (K-DUR,KLOR-CON) 20 MEQ tablet Take 40 mEq by mouth daily. 2 by mouth daily 11/20/14  Yes Gildardo Cranker, DO  pravastatin (PRAVACHOL) 20 MG tablet Take 10 mg by mouth daily. 1/2 by mouth daily   Yes Historical Provider, MD  QUEtiapine (SEROQUEL) 25 MG tablet Take 25 mg by mouth daily. Takes 1 tablet daily   Yes Historical Provider, MD  tamsulosin (FLOMAX) 0.4 MG CAPS capsule Take 0.4  mg by mouth daily.   Yes Historical Provider, MD    Allergies: No Known Allergies  Past Medical History: Past Medical History  Diagnosis Date  . Chest pain     Hospital February, 2012, nuclear, possible anteroseptal and inferoseptal ischemia, technically difficult, medical therapy  . Dyslipidemia   . Orthostatic hypotension   . Migraines     25 year  . Dementia     Significant  . Ejection fraction     EF 55%, echo, February, 2012  //   EF 55-60%,  echo, February 25, 2012  . Diastolic dysfunction     Echo, February, 2012  . TIA (transient ischemic attack)        ???question of a TIA in February, 2012, carotid Doppler, August 11, 2010 no significant abnormalities  . GI bleed     February, 2012 incomplete colonoscopy, hemorrhoids, needs complete colonoscopy  . CAD (coronary artery disease)     Probable CAD, nuclear February, 2012, possible anteroseptal and inferoseptal ischemia but the study was technically difficult  . Hyperlipidemia   . Edema     hospitalizations September, 2013, some fluid overload  . DNR (do not resuscitate)   . Major depression     2 overdoses in 1975 & 1977  . Arthritis   . Alzheimer disease     Past Surgical History  Procedure Laterality Date  . Abdominal hysterectomy    . Diagnostic mammogram  12/29/2000    Mammogram, suspicious lesions -BX pending  . Tonsillectomy      Social History: She lives with her daughter in [place]. No history of smoking, alcohol use or illicit drug use. Usually independent with daily activities. She is a DO NOT RESUSCITATE.  Family History:  Family History  Problem Relation Age of Onset  . Heart attack Father     several heart attacks  . Hypertension Daughter   . Cancer Father   . Suicidality Mother   . Heart attack Brother   . Heart disease Brother   . Pneumonia Brother   . Suicidality Son   . Arthritis Father   . Depression Other     Most family members     Review of Systems - History obtained from the patient General ROS: negative Psychological ROS: negative Ophthalmic ROS: negative ENT ROS: negative Allergy and Immunology ROS: negative Hematological and Lymphatic ROS: negative Endocrine ROS: negative Respiratory ROS: as in hpi Cardiovascular ROS: no chest pain or dyspnea on exertion Gastrointestinal ROS: no abdominal pain, change in bowel habits, or black or bloody stools Genito-Urinary ROS: as in hpi Musculoskeletal ROS: negative Neurological  ROS: no TIA or stroke symptoms Dermatological ROS: negative  Physical Examination  Filed Vitals:   02/01/15 0816 02/01/15 0900 02/01/15 1000 02/01/15 1002  BP:  122/56 116/50   Pulse:  74 70   Temp:      TempSrc:      Resp:  22 19   Height:    5' 4" (1.626 m)  Weight:    83.462 kg (184 lb)  SpO2: 91% 90% 89%     BP 116/50 mmHg  Pulse 70  Temp(Src) 98.3 F (36.8 C) (Oral)  Resp 19  Ht 5' 4" (1.626 m)  Wt 83.462 kg (184 lb)  BMI 31.57 kg/m2  SpO2 89%  General appearance: alert, cooperative, appears stated age and no distress Head: Normocephalic, without obvious abnormality, atraumatic Eyes: conjunctivae/corneas clear. PERRL, EOM's intact.  Throat: lips, mucosa, and tongue normal; teeth and gums normal Neck: no  adenopathy, no carotid bruit, no JVD, supple, symmetrical, trachea midline and thyroid not enlarged, symmetric, no tenderness/mass/nodules Resp: Diminished air entry at the bases with crackles. No wheezing. No rhonchi. Cardio: regular rate and rhythm, S1, S2 normal, no murmur, click, rub or gallop GI: soft, non-tender; bowel sounds normal; no masses,  no organomegaly Extremities: extremities normal, atraumatic, no cyanosis or edema Pulses: 2+ and symmetric Skin: Skin color, texture, turgor normal. No rashes or lesions Lymph nodes: Cervical, supraclavicular, and axillary nodes normal. Neurologic: No focal deficits  Laboratory Data: Results for orders placed or performed during the hospital encounter of 02/01/15 (from the past 48 hour(s))  Urinalysis, Routine w reflex microscopic (not at [facility])     Status: None   Collection Time: 02/01/15  8:50 AM  Result Value Ref Range   Color, Urine YELLOW YELLOW   APPearance CLEAR CLEAR   Specific Gravity, Urine 1.019 1.005 - 1.030   pH 7.5 5.0 - 8.0   Glucose, UA NEGATIVE NEGATIVE mg/dL   Hgb urine dipstick NEGATIVE NEGATIVE   Bilirubin Urine NEGATIVE NEGATIVE   Ketones, ur NEGATIVE NEGATIVE mg/dL   Protein, ur NEGATIVE  NEGATIVE mg/dL   Urobilinogen, UA 1.0 0.0 - 1.0 mg/dL   Nitrite NEGATIVE NEGATIVE   Leukocytes, UA NEGATIVE NEGATIVE    Comment: MICROSCOPIC NOT DONE ON URINES WITH NEGATIVE PROTEIN, BLOOD, LEUKOCYTES, NITRITE, OR GLUCOSE <1000 mg/dL.  Comprehensive metabolic panel     Status: Abnormal   Collection Time: 02/01/15  9:07 AM  Result Value Ref Range   Sodium 137 135 - 145 mmol/L   Potassium 3.6 3.5 - 5.1 mmol/L   Chloride 106 101 - 111 mmol/L   CO2 22 22 - 32 mmol/L   Glucose, Bld 119 (H) 65 - 99 mg/dL   BUN 11 6 - 20 mg/dL   Creatinine, Ser 0.72 0.44 - 1.00 mg/dL   Calcium 8.3 (L) 8.9 - 10.3 mg/dL   Total Protein 5.9 (L) 6.5 - 8.1 g/dL   Albumin 3.3 (L) 3.5 - 5.0 g/dL   AST 16 15 - 41 U/L   ALT 14 14 - 54 U/L   Alkaline Phosphatase 53 38 - 126 U/L   Total Bilirubin 0.7 0.3 - 1.2 mg/dL   GFR calc non Af Amer >60 >60 mL/min   GFR calc Af Amer >60 >60 mL/min    Comment: (NOTE) The eGFR has been calculated using the CKD EPI equation. This calculation has not been validated in all clinical situations. eGFR's persistently <60 mL/min signify possible Chronic Kidney Disease.    Anion gap 9 5 - 15  CBC WITH DIFFERENTIAL     Status: Abnormal   Collection Time: 02/01/15  9:07 AM  Result Value Ref Range   WBC 7.3 4.0 - 10.5 K/uL   RBC 4.28 3.87 - 5.11 MIL/uL   Hemoglobin 13.4 12.0 - 15.0 g/dL   HCT 39.2 36.0 - 46.0 %   MCV 91.6 78.0 - 100.0 fL   MCH 31.3 26.0 - 34.0 pg   MCHC 34.2 30.0 - 36.0 g/dL   RDW 12.7 11.5 - 15.5 %   Platelets 139 (L) 150 - 400 K/uL   Neutrophils Relative % 81 (H) 43 - 77 %   Neutro Abs 5.9 1.7 - 7.7 K/uL   Lymphocytes Relative 12 12 - 46 %   Lymphs Abs 0.9 0.7 - 4.0 K/uL   Monocytes Relative 7 3 - 12 %   Monocytes Absolute 0.5 0.1 - 1.0 K/uL   Eosinophils Relative  0 0 - 5 %   Eosinophils Absolute 0.0 0.0 - 0.7 K/uL   Basophils Relative 0 0 - 1 %   Basophils Absolute 0.0 0.0 - 0.1 K/uL  I-Stat CG4 Lactic Acid, ED  (not at  [facility])     Status: None    Collection Time: 02/01/15  9:15 AM  Result Value Ref Range   Lactic Acid, Venous 1.20 0.5 - 2.0 mmol/L    Radiology Reports: Dg Chest Port 1 View  02/01/2015   CLINICAL DATA:  Fever.  EXAM: PORTABLE CHEST - 1 VIEW  COMPARISON:  April 18, 2013.  FINDINGS: The heart size and mediastinal contours are within normal limits. No pneumothorax or pleural effusion is noted. Mild bibasilar opacities are noted, with right greater than left, concerning for subsegmental atelectasis or possibly pneumonia. The visualized skeletal structures are unremarkable.  IMPRESSION: Mild bibasilar opacities as described above concerning for subsegmental atelectasis or possibly pneumonia. Followup radiographs are recommended to ensure resolution.   Electronically Signed   By: Marijo Conception, M.D.   On: 02/01/2015 08:46     Problem List  Principal Problem:   HCAP (healthcare-associated pneumonia) Active Problems:   Dementia   CAD (coronary artery disease)   Essential hypertension   Hypoxia   Recurrent UTI   Assessment: This is a 81 year old Caucasian female who comes in with fever, some shortness of breath and cough. She was noted to be mildly hypoxic in the emergency department. Chest x-ray suggest pneumonia. She's within the 90 day timeframe for healthcare associated pneumonia. Also she's been on multiple antibiotics recently. So, she might benefit from being on broad-spectrum coverage, at least initially.  Plan: #1 healthcare associated pneumonia with hypoxia: She'll be admitted to the hospital. Continue oxygen. Blood cultures will be followed up on. Consult speech pathologist for swallowing evaluation. Aspiration precautions.  #2 History of recurrent UTIs: Urine analysis was normal. Urine cultures will be followed up on. She can follow-up with her primary care provider and neurologist.  #3 Generalized weakness/fatigue: Most likely secondary to pneumonia. Gentle IV hydration. PT and OT to evaluate.  #4  history of coronary artery disease diagnosed in 2012: Stable. Due to her dementia she is not a candidate for aggressive intervention.   #5 history of essential hypertension: Monitor blood pressures closely. Hold her diuretics for now. Resume other agents.  #6 History of Alzheimer's dementia: Stable.   DVT Prophylaxis: Lovenox Code Status: DO NOT RESUSCITATE Family Communication: Discussed with the daughter  Disposition Plan: Admit to MedSurg   Further management decisions will depend on results of further testing and patient's response to treatment.   [facility]  [facility] Pager [phone]  If 7PM-7AM, please contact night-coverage [email or web address] Password [email or web address]  02/01/2015, 11:02 AM

## 2015-02-01 NOTE — Evaluation (Signed)
Clinical/Bedside Swallow Evaluation Patient Details  Name: Valerie Chen MRN: [id number] Date of Birth: 1928/11/03  Today's Date: 02/01/2015 Time: SLP Start Time (ACUTE ONLY): 1650 SLP Stop Time (ACUTE ONLY): 1710 SLP Time Calculation (min) (ACUTE ONLY): 20 min  Past Medical History:  Past Medical History  Diagnosis Date  . Chest pain     Hospital February, 2012, nuclear, possible anteroseptal and inferoseptal ischemia, technically difficult, medical therapy  . Dyslipidemia   . Orthostatic hypotension   . Migraines     25 year  . Dementia     Significant  . Ejection fraction     EF 55%, echo, February, 2012  //   EF 55-60%, echo, February 25, 2012  . Diastolic dysfunction     Echo, February, 2012  . TIA (transient ischemic attack)        ???question of a TIA in February, 2012, carotid Doppler, August 11, 2010 no significant abnormalities  . GI bleed     February, 2012 incomplete colonoscopy, hemorrhoids, needs complete colonoscopy  . CAD (coronary artery disease)     Probable CAD, nuclear February, 2012, possible anteroseptal and inferoseptal ischemia but the study was technically difficult  . Hyperlipidemia   . Edema     hospitalizations September, 2013, some fluid overload  . DNR (do not resuscitate)   . Major depression     2 overdoses in 1975 & 1977  . Arthritis   . Alzheimer disease    Past Surgical History:  Past Surgical History  Procedure Laterality Date  . Abdominal hysterectomy    . Diagnostic mammogram  12/29/2000    Mammogram, suspicious lesions -BX pending  . Tonsillectomy     HPI:  HPI: Valerie Chen is a 81 y.o. female with a past medical history of Alzheimer's dementia, coronary artery disease, depression, who has had frequent UTIs for the past 2 weeks per MD note. She was hospitalized back in May for a suspected TIA. She is followed by urologist in [facility] per Md note. 24-48 hours PTA patient had been having generalized weakness.  Patient  has dementia and unable to provide much information but daughter present and interviewed.  On day of admit, pt had a temperature 101F and had a dry cough with some difficulty with breathing per MD note. Pt foudn to have ? pna or subsegmental ATX Evaluation the emergency department revealed suspected pneumonia. CXR showed bibasilar ATX or pna.  CT negative 01/18/15.  Pt does have anterior cervical osteophyte at C5-C6 seen on DG cervical spine 10/2014.    Assessment / Plan / Recommendation Clinical Impression  Pt presents with functional oropharyngeal swallow - no indication of airway compromise.  Daughter denies pt coughing with po intake, admits to pt coughing some over night in the ED while lying down.  CN exam unremarkable and pt self fed thin liquids, applesauce and graham cracker.  Slight delay in oral transiting and minimal residuals of cracker which pt cleared independently.  Daughter Valerie Chen present and reports pt has never had pna and has maintained her weight.  Recommend continue regular diet.  Educated daughter to compensation strategies to use if needed for dysphagia with dementia.  No follow up needed as all education completed. Thanks for this consult.     Aspiration Risk  Mild    Diet Recommendation Age appropriate regular solids;Thin   Medication Administration: Whole meds with liquid Compensations: Small sips/bites;Slow rate    Other  Recommendations Oral Care Recommendations: Oral care BID   Follow  Up Recommendations       Frequency and Duration        Pertinent Vitals/Pain Afebrile, decreased      Swallow Study Prior Functional Status   see hhx    General Date of Onset: 02/01/15 Other Pertinent Information: HPI: Valerie Chen is a 81 y.o. female with a past medical history of Alzheimer's dementia, coronary artery disease, depression, who has had frequent UTIs for the past 2 weeks per MD note. She was hospitalized back in May for a suspected TIA. She is followed by  urologist in [facility] per Md note. 24-48 hours PTA patient had been having generalized weakness.  Patient has dementia and unable to provide much information but daughter present and interviewed.  On day of admit, pt had a temperature 101F and had a dry cough with some difficulty with breathing per MD note. Pt foudn to have ? pna or subsegmental ATX Evaluation the emergency department revealed suspected pneumonia. CXR showed bibasilar ATX or pna.  CT negative 01/18/15.  Pt does have anterior cervical osteophyte at C5-C6 seen on DG cervical spine 10/2014.  Type of Study: Bedside swallow evaluation Diet Prior to this Study: Regular;Thin liquids Temperature Spikes Noted: No Respiratory Status: Supplemental O2 delivered via (comment) Behavior/Cognition: Alert;Cooperative;Pleasant mood Oral Cavity - Dentition: Adequate natural dentition/normal for age (upper partial) Self-Feeding Abilities: Needs set up;Able to feed self Patient Positioning: Upright in bed Baseline Vocal Quality: Low vocal intensity Volitional Cough: Weak Volitional Swallow: Able to elicit    Oral/Motor/Sensory Function Overall Oral Motor/Sensory Function: Appears within functional limits for tasks assessed   Ice Chips Ice chips: Within functional limits Presentation: Spoon   Thin Liquid Thin Liquid: Within functional limits Presentation: Self Fed;Straw    Nectar Thick Nectar Thick Liquid: Not tested   Honey Thick Honey Thick Liquid: Not tested   Puree Puree: Within functional limits Presentation: Self Fed;Spoon   Solid   GO    Solid: Within functional limits Presentation: Self Fed Other Comments: slow but effective mastication, minimal oral residual noted that pt cleared independently       Luanna Salk, [facility] SLP [phone]

## 2015-02-01 NOTE — ED Notes (Signed)
Awake. Verbally responsive. A/O x4. Resp even and unlabored. No audible adventitious breath sounds noted. ABC's intact. SR on monitor. IV infusing Rocephin and NS without difficulty. Pt had no adverse reaction to ABT. Family at bedside.

## 2015-02-01 NOTE — Progress Notes (Addendum)
ANTIBIOTIC CONSULT NOTE - INITIAL  Pharmacy Consult for Vancomycin / Renal antibiotic dose adjustment Indication: HCAP  No Known Allergies  Patient Measurements:   Adjusted Body Weight:   Vital Signs: Temp: 98.3 F (36.8 C) (08/13 0801) Temp Source: Oral (08/13 0801) BP: 122/53 mmHg (08/13 0801) Pulse Rate: 77 (08/13 0801) Intake/Output from previous day:   Intake/Output from this shift:    Labs: No results for input(s): WBC, HGB, PLT, LABCREA, CREATININE in the last 72 hours. CrCl cannot be calculated (Unknown ideal weight.). No results for input(s): VANCOTROUGH, VANCOPEAK, VANCORANDOM, GENTTROUGH, GENTPEAK, GENTRANDOM, TOBRATROUGH, TOBRAPEAK, TOBRARND, AMIKACINPEAK, AMIKACINTROU, AMIKACIN in the last 72 hours.   Microbiology: No results found for this or any previous visit (from the past 720 hour(s)).  Medical History: Past Medical History  Diagnosis Date  . Chest pain     Hospital February, 2012, nuclear, possible anteroseptal and inferoseptal ischemia, technically difficult, medical therapy  . Dyslipidemia   . Orthostatic hypotension   . Migraines     25 year  . Dementia     Significant  . Ejection fraction     EF 55%, echo, February, 2012  //   EF 55-60%, echo, February 25, 2012  . Diastolic dysfunction     Echo, February, 2012  . TIA (transient ischemic attack)        ???question of a TIA in February, 2012, carotid Doppler, August 11, 2010 no significant abnormalities  . GI bleed     February, 2012 incomplete colonoscopy, hemorrhoids, needs complete colonoscopy  . CAD (coronary artery disease)     Probable CAD, nuclear February, 2012, possible anteroseptal and inferoseptal ischemia but the study was technically difficult  . Hyperlipidemia   . Edema     hospitalizations September, 2013, some fluid overload  . DNR (do not resuscitate)   . Major depression     2 overdoses in 1975 & 1977  . Arthritis   . Alzheimer disease    Assessment: 58 yoF with  PMHx CAD, HLD, dementia, depression, and migraines and being treated for UTI with macrobid and flomax since 8/4 presents with fever, increased confusion, lower back pain, and foul odor per family.  U/A negative and i-STAT normal.  Chest x-ray shows bibasilar opacities. Pt initially started on CAP coverage, now transitioned to Vancomycin and Ceftazidime for HCAP.   Anti-infectives 8/13 >> Azithromycin >> 8/13 8/13 >> Ceftriaxone >> 8/13 8/13 >> Vancomycin >> 8/13 >> Ceftazidime >>  Vitals/Labs WBC: WNL Tm24h: Afebrile SCr: 0.72, CrCl ~55 ml/min  Cultures 8/13 Bloodx2: IP 8/13 Urine: IP   Goal of Therapy:  Eradication of infection  Plan:  Vancomycin 750mg  q12h Ceftazidime 2g IV q8h F/u renal function, cultures, Vanc trough at Css as warranted, clinical course  Ralene Bathe, PharmD, BCPS 02/01/2015, 12:56 PM  Pager: [phone]

## 2015-02-01 NOTE — ED Notes (Signed)
Dr. Krishnan at bedside. 

## 2015-02-01 NOTE — ED Notes (Signed)
Pt arrived via EMS with report of temp of 101.2/tym and increase confusion at home. Pt currently being treated for UTI with Macrobid and Flomax on 01/23/2015. Pt was given Tylenol 1000mg  and NS 590ml bolus en route. Family reported foul odor, frequency/urgency, lower back pain but no hematuria.

## 2015-02-01 NOTE — ED Notes (Signed)
Awake. Verbally responsive. A/O x4. Resp even and unlabored. No audible adventitious breath sounds noted. ABC's intact. SR on monitor. IV patent and intact saline lock.

## 2015-02-02 DIAGNOSIS — N390 Urinary tract infection, site not specified: Secondary | ICD-10-CM

## 2015-02-02 LAB — CBC
HEMATOCRIT: 37 % (ref 36.0–46.0)
Hemoglobin: 12.3 g/dL (ref 12.0–15.0)
MCH: 31.2 pg (ref 26.0–34.0)
MCHC: 33.2 g/dL (ref 30.0–36.0)
MCV: 93.9 fL (ref 78.0–100.0)
Platelets: 142 10*3/uL — ABNORMAL LOW (ref 150–400)
RBC: 3.94 MIL/uL (ref 3.87–5.11)
RDW: 13.1 % (ref 11.5–15.5)
WBC: 4.3 10*3/uL (ref 4.0–10.5)

## 2015-02-02 LAB — COMPREHENSIVE METABOLIC PANEL
ALT: 13 U/L — ABNORMAL LOW (ref 14–54)
AST: 15 U/L (ref 15–41)
Albumin: 2.8 g/dL — ABNORMAL LOW (ref 3.5–5.0)
Alkaline Phosphatase: 51 U/L (ref 38–126)
Anion gap: 5 (ref 5–15)
BILIRUBIN TOTAL: 0.2 mg/dL — AB (ref 0.3–1.2)
BUN: 13 mg/dL (ref 6–20)
CHLORIDE: 113 mmol/L — AB (ref 101–111)
CO2: 23 mmol/L (ref 22–32)
Calcium: 8 mg/dL — ABNORMAL LOW (ref 8.9–10.3)
Creatinine, Ser: 0.73 mg/dL (ref 0.44–1.00)
GLUCOSE: 106 mg/dL — AB (ref 65–99)
Potassium: 3.9 mmol/L (ref 3.5–5.1)
SODIUM: 141 mmol/L (ref 135–145)
Total Protein: 5.3 g/dL — ABNORMAL LOW (ref 6.5–8.1)

## 2015-02-02 LAB — URINE CULTURE: Culture: NO GROWTH

## 2015-02-02 LAB — STREP PNEUMONIAE URINARY ANTIGEN: STREP PNEUMO URINARY ANTIGEN: NEGATIVE

## 2015-02-02 LAB — HIV ANTIBODY (ROUTINE TESTING W REFLEX): HIV SCREEN 4TH GENERATION: NONREACTIVE

## 2015-02-02 NOTE — Progress Notes (Signed)
PT Cancellation Note  Patient Details Name: Valerie Chen MRN: [id number] DOB: Dec 27, 1928   Cancelled Treatment:    Reason Eval/Treat Not Completed: Fatigue/lethargy limiting ability to participate. Attempted PT eval-pt declined to participate at this time. Will check back as schedule permits. Thanks.    Weston Anna, MPT Pager: [phone]

## 2015-02-02 NOTE — Progress Notes (Signed)
[facility] PROGRESS NOTE  Valerie Chen WIO:[id number] DOB: 1928/08/07 DOA: 02/01/2015  PCP: Monico Blitz, MD  Brief HPI: 81 year old Caucasian female who lives with her daughter and knows had recurrent UTIs in the past few weeks, presented with fever and lethargy. Found to have pneumonia on chest x-ray. She was hospitalized for further management of healthcare associated pneumonia.  Past medical history:  Past Medical History  Diagnosis Date  . Chest pain     Hospital February, 2012, nuclear, possible anteroseptal and inferoseptal ischemia, technically difficult, medical therapy  . Dyslipidemia   . Orthostatic hypotension   . Migraines     25 year  . Dementia     Significant  . Ejection fraction     EF 55%, echo, February, 2012  //   EF 55-60%, echo, February 25, 2012  . Diastolic dysfunction     Echo, February, 2012  . TIA (transient ischemic attack)        ???question of a TIA in February, 2012, carotid Doppler, August 11, 2010 no significant abnormalities  . GI bleed     February, 2012 incomplete colonoscopy, hemorrhoids, needs complete colonoscopy  . CAD (coronary artery disease)     Probable CAD, nuclear February, 2012, possible anteroseptal and inferoseptal ischemia but the study was technically difficult  . Hyperlipidemia   . Edema     hospitalizations September, 2013, some fluid overload  . DNR (do not resuscitate)   . Major depression     2 overdoses in 1975 & 1977  . Arthritis   . Alzheimer disease     Consultants: None  Procedures: None  Antibiotics: Vancomycin and ceftazidime 8/13  Subjective: Patient somewhat tired this morning. Denies any pain. No worsening shortness of breath. Occasional dry cough. Denies any chest pain. No nausea, vomiting. Daughter is at bedside.  Objective: Vital Signs  Filed Vitals:   02/01/15 2059 02/02/15 0027 02/02/15 0157 02/02/15 0513  BP: 117/34 117/44 133/40 133/50  Pulse: 70  67 68  Temp: 98.5 F (36.9 C)   98.5 F (36.9 C) 98.3 F (36.8 C)  TempSrc: Oral  Oral Oral  Resp: 16  24 20   Height:      Weight:      SpO2: 97%  96% 96%    Intake/Output Summary (Last 24 hours) at 02/02/15 0915 Last data filed at 02/02/15 4268  Gross per 24 hour  Intake    720 ml  Output   1050 ml  Net   -330 ml   Filed Weights   02/01/15 1002  Weight: 83.462 kg (184 lb)    General appearance: alert, cooperative, appears stated age and no distress Head: Normocephalic, without obvious abnormality, atraumatic Resp: Diminished air entry at the bases with a few crackles bilaterally. No wheezing. Cardio: regular rate and rhythm, S1, S2 normal, no murmur, click, rub or gallop GI: soft, non-tender; bowel sounds normal; no masses,  no organomegaly Extremities: extremities normal, atraumatic, no cyanosis or edema Neurologic: No focal deficits. Pleasantly confused.  Lab Results:  Basic Metabolic Panel:  Recent Labs Lab 02/01/15 0907 02/02/15 0510  NA 137 141  K 3.6 3.9  CL 106 113*  CO2 22 23  GLUCOSE 119* 106*  BUN 11 13  CREATININE 0.72 0.73  CALCIUM 8.3* 8.0*   Liver Function Tests:  Recent Labs Lab 02/01/15 0907 02/02/15 0510  AST 16 15  ALT 14 13*  ALKPHOS 53 51  BILITOT 0.7 0.2*  PROT 5.9* 5.3*  ALBUMIN 3.3* 2.8*  CBC:  Recent Labs Lab 02/01/15 0907 02/02/15 0510  WBC 7.3 4.3  NEUTROABS 5.9  --   HGB 13.4 12.3  HCT 39.2 37.0  MCV 91.6 93.9  PLT 139* 142*     Recent Results (from the past 240 hour(s))  Blood Culture (routine x 2)     Status: None (Preliminary result)   Collection Time: 02/01/15  8:11 AM  Result Value Ref Range Status   Specimen Description   Final    BLOOD RIGHT ANTECUBITAL Performed at [place]  Final   Culture PENDING  Incomplete   Report Status PENDING  Incomplete  Blood Culture (routine x 2)     Status: None (Preliminary result)   Collection Time: 02/01/15  9:05 AM    Result Value Ref Range Status   Specimen Description   Final    BLOOD LEFT ANTECUBITAL Performed at [facility]    Special Requests BOTTLES DRAWN AEROBIC AND ANAEROBIC 5CC  Final   Culture PENDING  Incomplete   Report Status PENDING  Incomplete      Studies/Results: Dg Chest Port 1 View  02/01/2015   CLINICAL DATA:  Fever.  EXAM: PORTABLE CHEST - 1 VIEW  COMPARISON:  April 18, 2013.  FINDINGS: The heart size and mediastinal contours are within normal limits. No pneumothorax or pleural effusion is noted. Mild bibasilar opacities are noted, with right greater than left, concerning for subsegmental atelectasis or possibly pneumonia. The visualized skeletal structures are unremarkable.  IMPRESSION: Mild bibasilar opacities as described above concerning for subsegmental atelectasis or possibly pneumonia. Followup radiographs are recommended to ensure resolution.   Electronically Signed   By: Marijo Conception, M.D.   On: 02/01/2015 08:46    Medications:  Scheduled: . amLODipine  5 mg Oral q morning - 10a  . aspirin EC  81 mg Oral q morning - 10a  . cefTAZidime (FORTAZ)  IV  2 g Intravenous 3 times per day  . cyanocobalamin  500 mcg Oral Daily  . docusate sodium  200 mg Oral BID  . enoxaparin (LOVENOX) injection  40 mg Subcutaneous Q24H  . isosorbide mononitrate  30 mg Oral Daily  . metoprolol tartrate  12.5 mg Oral BID  . mirtazapine  30 mg Oral Daily  . potassium chloride SA  40 mEq Oral Daily  . pravastatin  10 mg Oral q1800  . QUEtiapine  25 mg Oral Daily  . saccharomyces boulardii  250 mg Oral BID  . tamsulosin  0.4 mg Oral Daily  . vancomycin  750 mg Intravenous Q12H   Continuous:  PIR:[id number] **OR** acetaminophen, ondansetron **OR** ondansetron (ZOFRAN) IV  Assessment/Plan:  Principal Problem:   HCAP (healthcare-associated pneumonia) Active Problems:   Dementia   CAD (coronary artery disease)   Essential hypertension   Hypoxia   Recurrent  UTI    Healthcare associated pneumonia with hypoxia Continue vancomycin and ceftazidime. Culture data is pending. Swallow evaluation was completed and no dysphagia identified. Continue current diet. Aspiration precautions. Continue oxygen for now. We will check room air saturations when she is improved.  History of recurrent UTIs UA was unremarkable. Urine cultures will be followed up on. She is followed by a urologist and primary care physician for same.  Generalized weakness/fatigue Likely secondary to pneumonia. No focal deficits. PTOT and OT to evaluate. Gentle IV hydration was given. Okay to stop IV fluids.  History of coronary artery disease diagnosed in 2012  Stable.  History of essential hypertension Stable. Monitor blood pressures closely. Holding her diuretics.  History of Alzheimer's dementia Stable.  DVT Prophylaxis: Lovenox    Code Status: DO NOT RESUSCITATE  Family Communication: Discussed with the patient's daughter  Disposition Plan: Mobilize by PT and OT. Continue current treatment.  Follow-up Appointment?: With her PCP within 1 week after discharge   LOS: 1 day   [place] [facility] [facility] Pager [phone] 02/02/2015, 9:15 AM  If 7PM-7AM, please contact night-coverage at [email or web address], password [facility]

## 2015-02-02 NOTE — Evaluation (Signed)
Physical Therapy Evaluation-1x Patient Details Name: Aleyna Cueva MRN: [id number] DOB: 11/29/1928 Today's Date: 02/02/2015   History of Present Illness  81 yo female admitted with pna. Hx of dementia, cad, htn.   Clinical Impression  On eval, pt was Min guard assist for mobility-walked ~450 feet. Pt tolerated activity well. O2 sats 98% on RA at end of session. Caregiver present. Feel pt is okay to mobilize with caregiver, family, or nursing. Recommend daily ambulation in hallway. No further acute or follow up PT needs. Will sign off.     Follow Up Recommendations No PT follow up;Supervision/Assistance - 24 hour    Equipment Recommendations  None recommended by PT    Recommendations for Other Services       Precautions / Restrictions Precautions Precautions: Fall Restrictions Weight Bearing Restrictions: No      Mobility  Bed Mobility Overal bed mobility: Modified Independent                Transfers Overall transfer level: Needs assistance   Transfers: Sit to/from Stand Sit to Stand: Min guard         General transfer comment: for safety  Ambulation/Gait Ambulation/Gait assistance: Min guard Ambulation Distance (Feet): 450 Feet Assistive device: None Gait Pattern/deviations: Step-through pattern;Decreased stride length     General Gait Details: close guard for safety. slightly unsteady.   Stairs            Wheelchair Mobility    Modified Rankin (Stroke Patients Only)       Balance Overall balance assessment: Needs assistance         Standing balance support: Bilateral upper extremity supported;During functional activity Standing balance-Leahy Scale: Good                               Pertinent Vitals/Pain Pain Assessment: No/denies pain    Home Living Family/patient expects to be discharged to:: Private residence Living Arrangements: Children Available Help at Discharge: Family;Personal care attendant;Available 24  hours/day Type of Home:  (condo) Home Access: Stairs to enter   [employer] of Steps: 1 Home Layout: One level Home Equipment: Marine scientist - quad      Prior Function Level of Independence: Needs assistance   Gait / Transfers Assistance Needed: supervision  ADL's / Homemaking Assistance Needed: completes all ADLs except bathing-requires min/modA        Hand Dominance        Extremity/Trunk Assessment   Upper Extremity Assessment: Defer to OT evaluation           Lower Extremity Assessment: Overall WFL for tasks assessed      Cervical / Trunk Assessment: Normal  Communication   Communication: No difficulties  Cognition Arousal/Alertness: Awake/alert Behavior During Therapy: WFL for tasks assessed/performed Overall Cognitive Status: History of cognitive impairments - at baseline                      General Comments      Exercises        Assessment/Plan    PT Assessment Patent does not need any further PT services  PT Diagnosis Difficulty walking   PT Problem List Decreased mobility  PT Treatment Interventions     PT Goals (Current goals can be found in the Care Plan section) Acute Rehab PT Goals Patient Stated Goal: none stated PT Goal Formulation: All assessment and education complete, DC therapy    Frequency  Barriers to discharge        Co-evaluation               End of Session Equipment Utilized During Treatment: Gait belt Activity Tolerance: Patient tolerated treatment well Patient left: in chair;with call bell/phone within reach;with nursing/sitter in room           Time: 9476-5465 PT Time Calculation (min) (ACUTE ONLY): 16 min   Charges:   PT Evaluation $Initial PT Evaluation Tier I: 1 Procedure     PT G Codes:        Weston Anna, MPT Pager: [phone]

## 2015-02-03 LAB — LEGIONELLA ANTIGEN, URINE

## 2015-02-03 NOTE — Care Management Important Message (Signed)
Important Message  Patient Details  Name: Tayva Easterday MRN: [id number] Date of Birth: [birth year]   Medicare Important Message Given:  Yes-second notification given    Shelda Altes 02/03/2015, [place] Message  Patient Details  Name: Ardelia Wrede MRN: [id number] Date of Birth: 1928/08/31   Medicare Important Message Given:  Yes-second notification given    Shelda Altes 02/03/2015, 4:18 PM

## 2015-02-03 NOTE — Progress Notes (Addendum)
[facility] PROGRESS NOTE  Valerie Chen FYB:[id number] DOB: 09/14/1928 DOA: 02/01/2015  PCP: Monico Blitz, MD  Brief HPI: 81 year old Caucasian female who lives with her daughter and knows had recurrent UTIs in the past few weeks, presented with fever and lethargy. Found to have pneumonia on chest x-ray. She was hospitalized for further management of healthcare associated pneumonia.  Past medical history:  Past Medical History  Diagnosis Date  . Chest pain     Hospital February, 2012, nuclear, possible anteroseptal and inferoseptal ischemia, technically difficult, medical therapy  . Dyslipidemia   . Orthostatic hypotension   . Migraines     25 year  . Dementia     Significant  . Ejection fraction     EF 55%, echo, February, 2012  //   EF 55-60%, echo, February 25, 2012  . Diastolic dysfunction     Echo, February, 2012  . TIA (transient ischemic attack)        ???question of a TIA in February, 2012, carotid Doppler, August 11, 2010 no significant abnormalities  . GI bleed     February, 2012 incomplete colonoscopy, hemorrhoids, needs complete colonoscopy  . CAD (coronary artery disease)     Probable CAD, nuclear February, 2012, possible anteroseptal and inferoseptal ischemia but the study was technically difficult  . Hyperlipidemia   . Edema     hospitalizations September, 2013, some fluid overload  . DNR (do not resuscitate)   . Major depression     2 overdoses in 1975 & 1977  . Arthritis   . Alzheimer disease     Consultants: None  Procedures: None  Antibiotics: Vancomycin and ceftazidime 8/13  Subjective: Patient feels better. Had 3 episodes of loose stool this morning. Denies any abdominal pain, nausea or vomiting. Occasional dry cough. She is breathing okay. Daughter does notice some dyspnea with exertion.  Objective: Vital Signs  Filed Vitals:   02/02/15 1426 02/02/15 2154 02/03/15 0540 02/03/15 0920  BP: 108/57 126/51 143/47   Pulse: 61 64 62     Temp: 97.3 F (36.3 C) 98.4 F (36.9 C) 97.7 F (36.5 C)   TempSrc: Oral Oral Oral   Resp: 16 16 16    Height:      Weight:      SpO2: 100% 96% 96% 97%    Intake/Output Summary (Last 24 hours) at 02/03/15 5277 Last data filed at 02/02/15 1727  Gross per 24 hour  Intake    480 ml  Output      0 ml  Net    480 ml   Filed Weights   02/01/15 1002  Weight: 83.462 kg (184 lb)    General appearance: alert, cooperative, appears stated age and no distress Resp: Diminished air entry at the bases with a few crackles bilaterally. No wheezing. Some improvement in air entry today. Cardio: regular rate and rhythm, S1, S2 normal, no murmur, click, rub or gallop GI: soft, non-tender; bowel sounds normal; no masses,  no organomegaly Extremities: extremities normal, atraumatic, no cyanosis or edema Neurologic: No focal deficits. Pleasantly confused.  Lab Results:  Basic Metabolic Panel:  Recent Labs Lab 02/01/15 0907 02/02/15 0510  NA 137 141  K 3.6 3.9  CL 106 113*  CO2 22 23  GLUCOSE 119* 106*  BUN 11 13  CREATININE 0.72 0.73  CALCIUM 8.3* 8.0*   Liver Function Tests:  Recent Labs Lab 02/01/15 0907 02/02/15 0510  AST 16 15  ALT 14 13*  ALKPHOS 53 51  BILITOT 0.7 0.2*  PROT 5.9* 5.3*  ALBUMIN 3.3* 2.8*   CBC:  Recent Labs Lab 02/01/15 0907 02/02/15 0510  WBC 7.3 4.3  NEUTROABS 5.9  --   HGB 13.4 12.3  HCT 39.2 37.0  MCV 91.6 93.9  PLT 139* 142*     Recent Results (from the past 240 hour(s))  Blood Culture (routine x 2)     Status: None (Preliminary result)   Collection Time: 02/01/15  8:11 AM  Result Value Ref Range Status   Specimen Description BLOOD RIGHT ANTECUBITAL  Final   Special Requests BOTTLES DRAWN AEROBIC AND ANAEROBIC 6CC  Final   Culture   Final    NO GROWTH 1 DAY Performed at [facility]    Report Status PENDING  Incomplete  Urine culture     Status: None   Collection Time: 02/01/15  8:50 AM  Result Value Ref Range Status    Specimen Description URINE, CATHETERIZED  Final   Special Requests NONE  Final   Culture   Final    NO GROWTH 1 DAY Performed at [facility]    Report Status 02/02/2015 FINAL  Final  Blood Culture (routine x 2)     Status: None (Preliminary result)   Collection Time: 02/01/15  9:05 AM  Result Value Ref Range Status   Specimen Description BLOOD LEFT ANTECUBITAL  Final   Special Requests BOTTLES DRAWN AEROBIC AND ANAEROBIC 5CC  Final   Culture   Final    NO GROWTH 1 DAY Performed at [facility]    Report Status PENDING  Incomplete      Studies/Results: No results found.  Medications:  Scheduled: . amLODipine  5 mg Oral q morning - 10a  . aspirin EC  81 mg Oral q morning - 10a  . cefTAZidime (FORTAZ)  IV  2 g Intravenous 3 times per day  . cyanocobalamin  500 mcg Oral Daily  . enoxaparin (LOVENOX) injection  40 mg Subcutaneous Q24H  . isosorbide mononitrate  30 mg Oral Daily  . metoprolol tartrate  12.5 mg Oral BID  . mirtazapine  30 mg Oral Daily  . potassium chloride SA  40 mEq Oral Daily  . pravastatin  10 mg Oral q1800  . QUEtiapine  25 mg Oral Daily  . saccharomyces boulardii  250 mg Oral BID  . tamsulosin  0.4 mg Oral Daily  . vancomycin  750 mg Intravenous Q12H   Continuous:  GUR:[id number] **OR** acetaminophen, ondansetron **OR** ondansetron (ZOFRAN) IV  Assessment/Plan:  Principal Problem:   HCAP (healthcare-associated pneumonia) Active Problems:   Dementia   CAD (coronary artery disease)   Essential hypertension   Hypoxia   Recurrent UTI    Healthcare associated pneumonia with hypoxia Continue vancomycin and ceftazidime. Cultures and negative so far. Swallow evaluation was completed and no dysphagia identified. Continue current diet. Aspiration precautions. Saturating well on room air now. Anticipate change to oral antibiotics tomorrow. Continue PT and OT.  History of recurrent UTIs UA was unremarkable. Urine cultures is negative.  She is followed by a urologist and primary care physician for same.  Generalized weakness/fatigue Likely secondary to pneumonia. No focal deficits. PT and OT to evaluate.   History of coronary artery disease diagnosed in 2012 Stable.  History of essential hypertension Stable. Monitor blood pressures closely. Holding her diuretics.  History of Alzheimer's dementia Stable.  Loose stools Send stool sample to lab.  DVT Prophylaxis: Lovenox    Code Status: DO NOT RESUSCITATE  Family Communication: Discussed with  the patient's daughter  Disposition Plan: Mobilize by PT and OT. Continue current treatment. Anticipate discharge in 1-2 days.  Follow-up Appointment?: With her PCP within 1 week after discharge   LOS: 2 days   [facility] Pager [phone] 02/03/2015, 9:39 AM  If 7PM-7AM, please contact night-coverage at [email or web address], password [facility] [place]

## 2015-02-03 NOTE — Evaluation (Addendum)
Occupational Therapy One Time Evaluation Patient Details Name: Meyli Boice MRN: [id number] DOB: [birth year] Today's Date: 02/03/2015    History of Present Illness 81 yo female admitted with pna. Hx of dementia, cad, htn.    Clinical Impression   Family overall at min guard assist except requires assist for LB dressing to start clothing over feet and socks. Family or CNA have been helping pt for awhile with ADL and daughter verbalizes understanding of all OT education and feels she can manage pt with care at home. No further OT needs. Discussed recommendations for possible future use of a tubbench (has a seat now) and grab bar for bathroom. Daughter declines need to practice tub transfer. Educated pt on purse lip breathing technique and she did well with min cues . She does get 1-2/4 dyspnea with activity. Sats 97% after activity on RA     Follow Up Recommendations  No OT follow up;Supervision/Assistance - 24 hour    Equipment Recommendations  None recommended by OT    Recommendations for Other Services       Precautions / Restrictions Precautions Precautions: Fall      Mobility Bed Mobility               General bed mobility comments: pt up in bathroom with daughter.  Transfers Overall transfer level: Needs assistance   Transfers: Sit to/from Stand Sit to Stand: Min guard         General transfer comment: min guard for safety. pt slightly unsteady.    Balance                                            ADL Overall ADL's : Needs assistance/impaired Eating/Feeding: Independent;Sitting   Grooming: Wash/dry hands;Min guard;Standing   Upper Body Bathing: Set up;Sitting   Lower Body Bathing: Minimal assistance;Sit to/from stand   Upper Body Dressing : Set up;Sitting   Lower Body Dressing: Moderate assistance;Sit to/from stand   Toilet Transfer: Min guard;Ambulation;Comfort height toilet;Grab bars   Toileting- Clothing Manipulation and  Hygiene: Min guard;Sit to/from stand         General ADL Comments: Daughter present and very hands on with care and feels comfortable with how to assist pt at d/c. She has a CNA for when daughter isnt there. She states she assists pt to step over tub. Discussed tubbench option and she states she has seen them before and will look into a bench for possible future use. Daughter reports 3 loose stool overnight and 2 this morning. Informed nursing. Discussed AE for LB dressing but daughter states with pt's dementia this would not be easier for her to do. Discussed a grab bar to be placed onto toilet (possibly screwed into back of toilet itself) as pt does not have room for a 3in1 and daughter states the riser didnt work for her toilet. She will check with DME store about installign possible bar. She states no room for bar to be placed on wall.      Vision     Perception     Praxis      Pertinent Vitals/Pain Pain Assessment: No/denies pain     Hand Dominance     Extremity/Trunk Assessment Upper Extremity Assessment Upper Extremity Assessment: Overall WFL for tasks assessed           Communication Communication Communication: No difficulties   Cognition Arousal/Alertness: Awake/alert  Behavior During Therapy: WFL for tasks assessed/performed Overall Cognitive Status: History of cognitive impairments - at baseline                     General Comments       Exercises       Shoulder Instructions      Home Living Family/patient expects to be discharged to:: Private residence Living Arrangements: Children Available Help at Discharge: Family;Personal care attendant;Available 24 hours/day Type of Home:  (condo) Home Access: Stairs to enter [employer] of Steps: 1   Home Layout: One level     Bathroom Shower/Tub: Teacher, early years/pre: Handicapped height     Home Equipment: Marine scientist - quad          Prior  Functioning/Environment Level of Independence: Needs assistance  Gait / Transfers Assistance Needed: supervision ADL's / Homemaking Assistance Needed: completes all ADLs except bathing-requires min/modA and mod assist for socks, starting pants over feet and then pt can pull up. family or CNA provides steady assist to step over tub.        OT Diagnosis: Generalized weakness   OT Problem List:     OT Treatment/Interventions:      OT Goals(Current goals can be found in the care plan section) Acute Rehab OT Goals Patient Stated Goal: family to continue to help pt PRN but allow her to do what she can.  OT Frequency:     Barriers to D/C:            Co-evaluation              End of Session    Activity Tolerance: Patient tolerated treatment well Patient left: in chair;with call bell/phone within reach;with family/visitor present   Time: 9449-6759 OT Time Calculation (min): 20 min Charges:  OT General Charges $OT Visit: 1 Procedure OT Evaluation $Initial OT Evaluation Tier I: 1 Procedure G-Codes:    Jules Schick  [phone] 02/03/2015, 9:37 AM

## 2015-02-04 LAB — C DIFFICILE QUICK SCREEN W PCR REFLEX
C DIFFICILE (CDIFF) INTERP: NEGATIVE
C DIFFICILE (CDIFF) TOXIN: NEGATIVE
C Diff antigen: NEGATIVE

## 2015-02-04 MED ORDER — LEVOFLOXACIN 500 MG PO TABS
500.0000 mg | ORAL_TABLET | Freq: Every day | ORAL | Status: DC
  Administered 2015-02-04: 500 mg via ORAL
  Filled 2015-02-04: qty 1

## 2015-02-04 MED ORDER — CEFPODOXIME PROXETIL 200 MG PO TABS
200.0000 mg | ORAL_TABLET | Freq: Two times a day (BID) | ORAL | Status: DC
  Administered 2015-02-04 – 2015-02-05 (×2): 200 mg via ORAL
  Filled 2015-02-04 (×3): qty 1

## 2015-02-04 NOTE — Progress Notes (Signed)
[facility] PROGRESS NOTE  Valerie Chen UMP:[id number] DOB: 05-02-1929 DOA: 02/01/2015  PCP: Monico Blitz, MD  Brief HPI: 81 year old Caucasian female who lives with her daughter and knows had recurrent UTIs in the past few weeks, presented with fever and lethargy. Found to have pneumonia on chest x-ray. She was hospitalized for further management of healthcare associated pneumonia.  Past medical history:  Past Medical History  Diagnosis Date  . Chest pain     Hospital February, 2012, nuclear, possible anteroseptal and inferoseptal ischemia, technically difficult, medical therapy  . Dyslipidemia   . Orthostatic hypotension   . Migraines     25 year  . Dementia     Significant  . Ejection fraction     EF 55%, echo, February, 2012  //   EF 55-60%, echo, February 25, 2012  . Diastolic dysfunction     Echo, February, 2012  . TIA (transient ischemic attack)        ???question of a TIA in February, 2012, carotid Doppler, August 11, 2010 no significant abnormalities  . GI bleed     February, 2012 incomplete colonoscopy, hemorrhoids, needs complete colonoscopy  . CAD (coronary artery disease)     Probable CAD, nuclear February, 2012, possible anteroseptal and inferoseptal ischemia but the study was technically difficult  . Hyperlipidemia   . Edema     hospitalizations September, 2013, some fluid overload  . DNR (do not resuscitate)   . Major depression     2 overdoses in 1975 & 1977  . Arthritis   . Alzheimer disease     Consultants: None  Procedures: None  Antibiotics: Vancomycin and ceftazidime 8/13--8/16 Levaquin 8/16 caused confusion Started on Vantin  Subjective: Patient continues to feel better. Diarrhea is subsiding. Shortness of breath is improving.   Objective: Vital Signs  Filed Vitals:   02/03/15 0920 02/03/15 1400 02/03/15 2252 02/04/15 0607  BP:  144/62 133/59 124/47  Pulse:  62 68 66  Temp:  97.6 F (36.4 C) 97.7 F (36.5 C) 98.4 F (36.9  C)  TempSrc:  Oral Oral Oral  Resp:  18 16 18   Height:      Weight:      SpO2: 97% 97% 96% 98%    Intake/Output Summary (Last 24 hours) at 02/04/15 1044 Last data filed at 02/04/15 4008  Gross per 24 hour  Intake   1350 ml  Output      0 ml  Net   1350 ml   Filed Weights   02/01/15 1002  Weight: 83.462 kg (184 lb)    General appearance: alert, cooperative, appears stated age and no distress Resp: Improving air entry bilaterally. No wheezing.  Cardio: regular rate and rhythm, S1, S2 normal, no murmur, click, rub or gallop GI: soft, non-tender; bowel sounds normal; no masses,  no organomegaly Extremities: extremities normal, atraumatic, no cyanosis or edema Neurologic: No focal deficits. Pleasantly confused.  Lab Results:  Basic Metabolic Panel:  Recent Labs Lab 02/01/15 0907 02/02/15 0510  NA 137 141  K 3.6 3.9  CL 106 113*  CO2 22 23  GLUCOSE 119* 106*  BUN 11 13  CREATININE 0.72 0.73  CALCIUM 8.3* 8.0*   Liver Function Tests:  Recent Labs Lab 02/01/15 0907 02/02/15 0510  AST 16 15  ALT 14 13*  ALKPHOS 53 51  BILITOT 0.7 0.2*  PROT 5.9* 5.3*  ALBUMIN 3.3* 2.8*   CBC:  Recent Labs Lab 02/01/15 0907 02/02/15 0510  WBC 7.3 4.3  NEUTROABS 5.9  --  HGB 13.4 12.3  HCT 39.2 37.0  MCV 91.6 93.9  PLT 139* 142*     Recent Results (from the past 240 hour(s))  Blood Culture (routine x 2)     Status: None (Preliminary result)   Collection Time: 02/01/15  8:11 AM  Result Value Ref Range Status   Specimen Description BLOOD RIGHT ANTECUBITAL  Final   Special Requests BOTTLES DRAWN AEROBIC AND ANAEROBIC 6CC  Final   Culture   Final    NO GROWTH 2 DAYS Performed at [facility]    Report Status PENDING  Incomplete  Urine culture     Status: None   Collection Time: 02/01/15  8:50 AM  Result Value Ref Range Status   Specimen Description URINE, CATHETERIZED  Final   Special Requests NONE  Final   Culture   Final    NO GROWTH 1  DAY Performed at [facility]    Report Status 02/02/2015 FINAL  Final  Blood Culture (routine x 2)     Status: None (Preliminary result)   Collection Time: 02/01/15  9:05 AM  Result Value Ref Range Status   Specimen Description BLOOD LEFT ANTECUBITAL  Final   Special Requests BOTTLES DRAWN AEROBIC AND ANAEROBIC 5CC  Final   Culture   Final    NO GROWTH 2 DAYS Performed at [facility]    Report Status PENDING  Incomplete  C difficile quick scan w PCR reflex     Status: None   Collection Time: 02/04/15  6:43 AM  Result Value Ref Range Status   C Diff antigen NEGATIVE NEGATIVE Final   C Diff toxin NEGATIVE NEGATIVE Final   C Diff interpretation Negative for toxigenic C. difficile  Final      Studies/Results: No results found.  Medications:  Scheduled: . amLODipine  5 mg Oral q morning - 10a  . aspirin EC  81 mg Oral q morning - 10a  . cyanocobalamin  500 mcg Oral Daily  . enoxaparin (LOVENOX) injection  40 mg Subcutaneous Q24H  . isosorbide mononitrate  30 mg Oral Daily  . levofloxacin  500 mg Oral Daily  . metoprolol tartrate  12.5 mg Oral BID  . mirtazapine  30 mg Oral Daily  . potassium chloride SA  40 mEq Oral Daily  . pravastatin  10 mg Oral q1800  . QUEtiapine  25 mg Oral Daily  . saccharomyces boulardii  250 mg Oral BID  . tamsulosin  0.4 mg Oral Daily   Continuous:  UXN:[id number] **OR** acetaminophen, ondansetron **OR** ondansetron (ZOFRAN) IV  Assessment/Plan:  Principal Problem:   HCAP (healthcare-associated pneumonia) Active Problems:   Dementia   CAD (coronary artery disease)   Essential hypertension   Hypoxia   Recurrent UTI    Healthcare associated pneumonia with hypoxia Patient is improving. Cultures are all negative. She was changed over to Levaquin. However, daughter felt that her confusion got worse after she was started on Levaquin. Will change to Vantin. Swallow evaluation was completed and no dysphagia identified.  Continue current diet. Aspiration precautions. Saturating well on room air now. Anticipate discharge tomorrow if confusion resolves.  History of recurrent UTIs UA was unremarkable. Urine cultures is negative. She is followed by a urologist and primary care physician for same.  Generalized weakness/fatigue Likely secondary to pneumonia. No focal deficits. PT and OT as seen. No home health needs.   History of coronary artery disease diagnosed in 2012 Stable.  History of essential hypertension Stable. Monitor blood pressures  closely. Holding her diuretics.  History of Alzheimer's dementia Stable.  Loose stools Likely secondary to prune juice and antibiotics. C. difficile negative. Resolving.  DVT Prophylaxis: Lovenox    Code Status: DO NOT RESUSCITATE  Family Communication: Discussed with the patient's daughter  Disposition Plan: Mobilize by PT and OT. Change to oral antibiotics today. Anticipate discharge in 1-2 days.  Follow-up Appointment?: With her PCP within 1 week after discharge   LOS: 3 days   [facility] Pager [phone] 02/04/2015, 10:44 AM  If 7PM-7AM, please contact night-coverage at [email or web address], password [facility]

## 2015-02-04 NOTE — Care Management Note (Signed)
Case Management Note  Patient Details  Name: Valerie Chen MRN: [id number] Date of Birth: December 05, 1928  Subjective/Objective:      81 yo admitted with HCAP              Action/Plan: From home with daughter and caregiver  Expected Discharge Date:  02/04/15               Expected Discharge Plan:  Home/Self Care  In-House Referral:     Discharge planning Services  CM Consult  Post Acute Care Choice:    Choice offered to:     DME Arranged:    DME Agency:     HH Arranged:    [place] Agency:     Status of Service:  In process, will continue to follow  Medicare Important Message Given:  Yes-second notification given Date Medicare IM Given:    Medicare IM give by:    Date Additional Medicare IM Given:    Additional Medicare Important Message give by:     If discussed at [place] of Stay Meetings, dates discussed:    Additional Comments: Chart reviewed. Pt from home with daughter and private caregiver. PT/OT do not recommend any HHPT/OT at this time. CM will continue to follow and assist with DC as needed. Lynnell Catalan, RN 02/04/2015, 3:17 PM

## 2015-02-05 DIAGNOSIS — F0390 Unspecified dementia without behavioral disturbance: Secondary | ICD-10-CM

## 2015-02-05 DIAGNOSIS — I10 Essential (primary) hypertension: Secondary | ICD-10-CM

## 2015-02-05 DIAGNOSIS — J189 Pneumonia, unspecified organism: Principal | ICD-10-CM

## 2015-02-05 LAB — BASIC METABOLIC PANEL
Anion gap: 6 (ref 5–15)
BUN: 15 mg/dL (ref 6–20)
CHLORIDE: 109 mmol/L (ref 101–111)
CO2: 25 mmol/L (ref 22–32)
CREATININE: 0.8 mg/dL (ref 0.44–1.00)
Calcium: 9.1 mg/dL (ref 8.9–10.3)
GFR calc non Af Amer: >60 mL/min (ref >60)
Glucose, Bld: 101 mg/dL — ABNORMAL HIGH (ref 65–99)
Potassium: 4.3 mmol/L (ref 3.5–5.1)
SODIUM: 140 mmol/L (ref 135–145)

## 2015-02-05 MED ORDER — CEFPODOXIME PROXETIL 200 MG PO TABS: 200.0000 mg | ORAL_TABLET | Freq: Two times a day (BID) | ORAL | Status: DC

## 2015-02-05 MED ORDER — SACCHAROMYCES BOULARDII 250 MG PO CAPS: 250.0000 mg | ORAL_CAPSULE | Freq: Two times a day (BID) | ORAL | Status: DC

## 2015-02-05 MED ORDER — DOCUSATE SODIUM 100 MG PO CAPS: 200.0000 mg | ORAL_CAPSULE | Freq: Two times a day (BID) | ORAL | Status: DC | PRN

## 2015-02-05 NOTE — Discharge Summary (Signed)
Discharge Summary  Valerie Chen URK:[id number] DOB: Aug 30, 1928  PCP: Monico Blitz, MD  Admit date: 02/01/2015 Discharge date: 02/05/2015  Time spent: <67mins  Recommendations for Outpatient Follow-up:  1. F/u with PMD within a week, repeat cbc for follow up on platelet and lower extremity petechiae, repeat cxr in a few weeks to ensure resolution of mild bibasilar opacities.  Discharge Diagnoses:  Active Hospital Problems   Diagnosis Date Noted  . HCAP (healthcare-associated pneumonia) 02/01/2015  . Hypoxia 02/01/2015  . Recurrent UTI 02/01/2015  . Essential hypertension 05/30/2013  . CAD (coronary artery disease)   . Dementia     Resolved Hospital Problems   Diagnosis Date Noted Date Resolved  No resolved problems to display.    Discharge Condition: stable  Diet recommendation: heart healthy  Filed Weights   02/01/15 1002  Weight: 83.462 kg (184 lb)    History of present illness:  Valerie Chen is a 81 y.o. female with a past medical history of Alzheimer's dementia, coronary artery disease, depression, who has had frequent UTIs for the past 2 weeks. She was hospitalized back in May for a suspected TIA. She has been on multiple courses of antibiotics recently. She is followed by urologist in [facility]. She was placed on nitrofurantoin on August 4 and is supposed to complete her course on August 14. For the past 24-48 hours patient has been having generalized weakness. She's been walking less than normal. History was provided by the daughter who is at bedside. Patient has dementia and unable to provide much information. Overnight, patient had to get up multiple times to urinate, which is unusual. This morning she was found to have a temperature 101F. Daughter has noticed patient to have a dry cough. Patient was noted to have some difficulty with breathing for the last 24 hours. Patient denies any chest pain. There has been no nausea, vomiting, no abdominal pain. She had  some lower back pain earlier today but none currently. Daughter does tell me that she had an ultrasound of her genitourinary system recently. Evaluation the emergency department revealed suspected pneumonia. She'll be hospitalized for further management.  Hospital Course:  Principal Problem:   HCAP (healthcare-associated pneumonia) Active Problems:   Dementia   CAD (coronary artery disease)   Essential hypertension   Hypoxia   Recurrent UTI  Healthcare associated pneumonia with hypoxia Patient is improving. Cultures are all negative. She was changed over to Levaquin. However, daughter felt that her confusion got worse after she was started on Levaquin. Dr. [place] Pink changed to [place]. Daughter reported patient tolerating this well. Swallow evaluation was completed and no dysphagia identified. Continue current diet. Aspiration precautions. Saturating well on room air now. Discharge home with oral vantin, and pmd to repeat cxr to ensure resolution of mild bibasilar opacities.  History of recurrent UTIs UA was unremarkable. Urine cultures is negative. She is followed by a urologist and primary care physician for same. She is on flomax prior to admission, this is continued.  Generalized weakness/fatigue Likely secondary to pneumonia. No focal deficits. PT and OT as seen. No home health needs.   History of coronary artery disease diagnosed in 2012 Stable.  History of essential hypertension Stable. Monitor blood pressures closely. Holding her diuretics.  History of Alzheimer's dementia Stable.  Loose stools Likely secondary to prune juice and antibiotics. C. difficile negative. Resolving.  Confusion: resolved, likely reaction to levaquin.  Lower extremity petechiae: daughter reported patient has these since admission, plt wnl, advised to continue follow up with  pmd, repeat cbc at follow up   Code Status: DO NOT RESUSCITATE  Family Communication: Discussed with the patient's  daughter  Disposition Plan: home  Procedures:  none  Consultations:  none  Discharge Exam: BP 139/56 mmHg  Pulse 62  Temp(Src) 98.2 F (36.8 C) (Oral)  Resp 16  Ht 5\' 4"  (1.626 m)  Wt 83.462 kg (184 lb)  BMI 31.57 kg/m2  SpO2 96%  General appearance: alert, cooperative, appears stated age and no distress, ambulating in hallway with daughter by her side. Resp: Improving air entry bilaterally. No wheezing.  Cardio: regular rate and rhythm, S1, S2 normal, no murmur, click, rub or gallop GI: soft, non-tender; bowel sounds normal; no masses, no organomegaly Extremities: extremities normal, atraumatic, no cyanosis or edema Neurologic: No focal deficits. Pleasantly confused. Skin: petechiae bilateral lower extremity, nontender,    Discharge Instructions You were cared for by a hospitalist during your hospital stay. If you have any questions about your discharge medications or the care you received while you were in the hospital after you are discharged, you can call the unit and asked to speak with the hospitalist on call if the hospitalist that took care of you is not available. Once you are discharged, your primary care physician will handle any further medical issues. Please note that NO REFILLS for any discharge medications will be authorized once you are discharged, as it is imperative that you return to your primary care physician (or establish a relationship with a primary care physician if you do not have one) for your aftercare needs so that they can reassess your need for medications and monitor your lab values.  Discharge Instructions    Diet - low sodium heart healthy    Complete by:  As directed      Increase activity slowly    Complete by:  As directed             Medication List    STOP taking these medications        nitrofurantoin 100 MG capsule  Commonly known as:  MACRODANTIN      TAKE these medications        acetaminophen 325 MG tablet  Commonly  known as:  TYLENOL  Take 2 tablets (650 mg total) by mouth every 6 (six) hours as needed for mild pain (or Fever >/= 101).     amLODipine 5 MG tablet  Commonly known as:  NORVASC  Take 5 mg by mouth every morning.     aspirin EC 81 MG tablet  Take 81 mg by mouth every morning.     cefpodoxime 200 MG tablet  Commonly known as:  VANTIN  Take 1 tablet (200 mg total) by mouth 2 (two) times daily.     CRANBERRY EXTRACT PO  Take 3,600 mg by mouth daily.     cyanocobalamin 500 MCG tablet  Take 500 mcg by mouth daily.     docusate sodium 100 MG capsule  Commonly known as:  COLACE  Take 2 capsules (200 mg total) by mouth 2 (two) times daily as needed for mild constipation.     furosemide 40 MG tablet  Commonly known as:  LASIX  Take 40 mg by mouth every morning.     isosorbide mononitrate 30 MG 24 hr tablet  Commonly known as:  IMDUR  TAKE 1/2 TABLET BY MOUTH DAILY CUT FOR PATIENT     metoprolol tartrate 12.5 mg Tabs tablet  Commonly known as:  LOPRESSOR  Take  0.5 tablets (12.5 mg total) by mouth 2 (two) times daily.     mirtazapine 30 MG tablet  Commonly known as:  REMERON  Take 30 mg by mouth daily.     potassium chloride SA 20 MEQ tablet  Commonly known as:  K-DUR,KLOR-CON  Take 40 mEq by mouth daily. 2 by mouth daily     pravastatin 20 MG tablet  Commonly known as:  PRAVACHOL  Take 10 mg by mouth daily. 1/2 by mouth daily     QUEtiapine 25 MG tablet  Commonly known as:  SEROQUEL  Take 25 mg by mouth daily. Takes 1 tablet daily     saccharomyces boulardii 250 MG capsule  Commonly known as:  FLORASTOR  Take 1 capsule (250 mg total) by mouth 2 (two) times daily.     tamsulosin 0.4 MG Caps capsule  Commonly known as:  FLOMAX  Take 0.4 mg by mouth daily.       No Known Allergies     Follow-up Information    Follow up with [facility], MD In 1 week.   Specialty:  Internal Medicine   Why:  hospital discharge follow up,    Contact information:   [place] [id number] [phone]        The results of significant diagnostics from this hospitalization (including imaging, microbiology, ancillary and laboratory) are listed below for reference.    Significant Diagnostic Studies: Ct Head Wo Contrast  01/18/2015   CLINICAL DATA:  81 year old female with altered mental status and hallucinations.  EXAM: CT HEAD WITHOUT CONTRAST  TECHNIQUE: Contiguous axial images were obtained from the base of the skull through the vertex without intravenous contrast.  COMPARISON:  11/01/2014 and prior head CTs  FINDINGS: Chronic small-vessel white matter ischemic changes are identified.  No acute intracranial abnormalities are identified, including mass lesion or mass effect, hydrocephalus, extra-axial fluid collection, midline shift, hemorrhage, or acute infarction.  The visualized bony calvarium is unremarkable.  IMPRESSION: No evidence of acute intracranial abnormality.  Chronic small-vessel white matter ischemic changes.   Electronically Signed   By: Margarette Canada M.D.   On: 01/18/2015 13:52   Dg Chest Port 1 View  02/01/2015   CLINICAL DATA:  Fever.  EXAM: PORTABLE CHEST - 1 VIEW  COMPARISON:  April 18, 2013.  FINDINGS: The heart size and mediastinal contours are within normal limits. No pneumothorax or pleural effusion is noted. Mild bibasilar opacities are noted, with right greater than left, concerning for subsegmental atelectasis or possibly pneumonia. The visualized skeletal structures are unremarkable.  IMPRESSION: Mild bibasilar opacities as described above concerning for subsegmental atelectasis or possibly pneumonia. Followup radiographs are recommended to ensure resolution.   Electronically Signed   By: Marijo Conception, M.D.   On: 02/01/2015 08:46    Microbiology: Recent Results (from the past 240 hour(s))  Blood Culture (routine x 2)     Status: None (Preliminary result)   Collection Time: 02/01/15  8:11 AM  Result Value Ref Range Status    Specimen Description BLOOD RIGHT ANTECUBITAL  Final   Special Requests BOTTLES DRAWN AEROBIC AND ANAEROBIC 6CC  Final   Culture   Final    NO GROWTH 3 DAYS Performed at [facility]    Report Status PENDING  Incomplete  Urine culture     Status: None   Collection Time: 02/01/15  8:50 AM  Result Value Ref Range Status   Specimen Description URINE, CATHETERIZED  Final   Special  Requests NONE  Final   Culture   Final    NO GROWTH 1 DAY Performed at [facility]    Report Status 02/02/2015 FINAL  Final  Blood Culture (routine x 2)     Status: None (Preliminary result)   Collection Time: 02/01/15  9:05 AM  Result Value Ref Range Status   Specimen Description BLOOD LEFT ANTECUBITAL  Final   Special Requests BOTTLES DRAWN AEROBIC AND ANAEROBIC 5CC  Final   Culture   Final    NO GROWTH 3 DAYS Performed at [facility]    Report Status PENDING  Incomplete  C difficile quick scan w PCR reflex     Status: None   Collection Time: 02/04/15  6:43 AM  Result Value Ref Range Status   C Diff antigen NEGATIVE NEGATIVE Final   C Diff toxin NEGATIVE NEGATIVE Final   C Diff interpretation Negative for toxigenic C. difficile  Final     Labs: Basic Metabolic Panel:  Recent Labs Lab 02/01/15 0907 02/02/15 0510 02/05/15 0549  NA 137 141 140  K 3.6 3.9 4.3  CL 106 113* 109  CO2 22 23 25   GLUCOSE 119* 106* 101*  BUN 11 13 15   CREATININE 0.72 0.73 0.80  CALCIUM 8.3* 8.0* 9.1   Liver Function Tests:  Recent Labs Lab 02/01/15 0907 02/02/15 0510  AST 16 15  ALT 14 13*  ALKPHOS 53 51  BILITOT 0.7 0.2*  PROT 5.9* 5.3*  ALBUMIN 3.3* 2.8*   No results for input(s): LIPASE, AMYLASE in the last 168 hours. No results for input(s): AMMONIA in the last 168 hours. CBC:  Recent Labs Lab 02/01/15 0907 02/02/15 0510  WBC 7.3 4.3  NEUTROABS 5.9  --   HGB 13.4 12.3  HCT 39.2 37.0  MCV 91.6 93.9  PLT 139* 142*   Cardiac Enzymes: No results for input(s): CKTOTAL,  CKMB, CKMBINDEX, TROPONINI in the last 168 hours. BNP: BNP (last 3 results) No results for input(s): BNP in the last 8760 hours.  ProBNP (last 3 results) No results for input(s): PROBNP in the last 8760 hours.  CBG: No results for input(s): GLUCAP in the last 168 hours.     SignedFlorencia Reasons MD, PhD  [facility] 02/05/2015, 11:28 AM

## 2015-02-05 NOTE — Progress Notes (Signed)
Valerie Chen to be D/C'd Home per MD order.  Discussed prescriptions and follow up appointments with the patient. Prescriptions given to patient, medication list explained in detail. Pt verbalized understanding.    Medication List    STOP taking these medications        nitrofurantoin 100 MG capsule  Commonly known as:  MACRODANTIN      TAKE these medications        acetaminophen 325 MG tablet  Commonly known as:  TYLENOL  Take 2 tablets (650 mg total) by mouth every 6 (six) hours as needed for mild pain (or Fever >/= 101).     amLODipine 5 MG tablet  Commonly known as:  NORVASC  Take 5 mg by mouth every morning.     aspirin EC 81 MG tablet  Take 81 mg by mouth every morning.     cefpodoxime 200 MG tablet  Commonly known as:  VANTIN  Take 1 tablet (200 mg total) by mouth 2 (two) times daily.     CRANBERRY EXTRACT PO  Take 3,600 mg by mouth daily.     cyanocobalamin 500 MCG tablet  Take 500 mcg by mouth daily.     docusate sodium 100 MG capsule  Commonly known as:  COLACE  Take 2 capsules (200 mg total) by mouth 2 (two) times daily as needed for mild constipation.     furosemide 40 MG tablet  Commonly known as:  LASIX  Take 40 mg by mouth every morning.     isosorbide mononitrate 30 MG 24 hr tablet  Commonly known as:  IMDUR  TAKE 1/2 TABLET BY MOUTH DAILY CUT FOR PATIENT     metoprolol tartrate 12.5 mg Tabs tablet  Commonly known as:  LOPRESSOR  Take 0.5 tablets (12.5 mg total) by mouth 2 (two) times daily.     mirtazapine 30 MG tablet  Commonly known as:  REMERON  Take 30 mg by mouth daily.     potassium chloride SA 20 MEQ tablet  Commonly known as:  K-DUR,KLOR-CON  Take 40 mEq by mouth daily. 2 by mouth daily     pravastatin 20 MG tablet  Commonly known as:  PRAVACHOL  Take 10 mg by mouth daily. 1/2 by mouth daily     QUEtiapine 25 MG tablet  Commonly known as:  SEROQUEL  Take 25 mg by mouth daily. Takes 1 tablet daily     saccharomyces boulardii  250 MG capsule  Commonly known as:  FLORASTOR  Take 1 capsule (250 mg total) by mouth 2 (two) times daily.     tamsulosin 0.4 MG Caps capsule  Commonly known as:  FLOMAX  Take 0.4 mg by mouth daily.        Filed Vitals:   02/05/15 0559  BP: 139/56  Pulse: 62  Temp: 98.2 F (36.8 C)  Resp: 16    Skin clean, dry and intact without evidence of skin break down, no evidence of skin tears noted. IV catheter discontinued intact. Site without signs and symptoms of complications. Dressing and pressure applied. Pt denies pain at this time. No complaints noted.  An After Visit Summary was printed and given to the patient. Patient escorted via [place], and D/C home via private auto.  Valerie Chen 02/05/2015 12:57 PM

## 2015-02-06 LAB — CULTURE, BLOOD (ROUTINE X 2)
Culture: NO GROWTH
Culture: NO GROWTH

## 2015-03-01 ENCOUNTER — Encounter (HOSPITAL_COMMUNITY): Payer: Self-pay | Admitting: Emergency Medicine

## 2015-03-01 ENCOUNTER — Emergency Department (HOSPITAL_COMMUNITY)
Admission: EM | Admit: 2015-03-01 | Discharge: 2015-03-01 | Disposition: A | Discharge status: Home / Self Care | Payer: Medicare Other | Attending: Emergency Medicine | Admitting: Emergency Medicine

## 2015-03-01 ENCOUNTER — Emergency Department (HOSPITAL_COMMUNITY): Payer: Medicare Other

## 2015-03-01 DIAGNOSIS — I2510 Atherosclerotic heart disease of native coronary artery without angina pectoris: Secondary | ICD-10-CM | POA: Diagnosis not present

## 2015-03-01 DIAGNOSIS — F329 Major depressive disorder, single episode, unspecified: Secondary | ICD-10-CM | POA: Diagnosis not present

## 2015-03-01 DIAGNOSIS — R05 Cough: Secondary | ICD-10-CM

## 2015-03-01 DIAGNOSIS — I519 Heart disease, unspecified: Secondary | ICD-10-CM | POA: Insufficient documentation

## 2015-03-01 DIAGNOSIS — F0280 Dementia in other diseases classified elsewhere without behavioral disturbance: Secondary | ICD-10-CM | POA: Insufficient documentation

## 2015-03-01 DIAGNOSIS — Z7982 Long term (current) use of aspirin: Secondary | ICD-10-CM | POA: Diagnosis not present

## 2015-03-01 DIAGNOSIS — G309 Alzheimer's disease, unspecified: Secondary | ICD-10-CM | POA: Insufficient documentation

## 2015-03-01 DIAGNOSIS — M1990 Unspecified osteoarthritis, unspecified site: Secondary | ICD-10-CM | POA: Insufficient documentation

## 2015-03-01 DIAGNOSIS — Z79899 Other long term (current) drug therapy: Secondary | ICD-10-CM | POA: Diagnosis not present

## 2015-03-01 DIAGNOSIS — E785 Hyperlipidemia, unspecified: Secondary | ICD-10-CM | POA: Diagnosis not present

## 2015-03-01 DIAGNOSIS — R059 Cough, unspecified: Secondary | ICD-10-CM

## 2015-03-01 DIAGNOSIS — R509 Fever, unspecified: Secondary | ICD-10-CM | POA: Diagnosis present

## 2015-03-01 DIAGNOSIS — Z8673 Personal history of transient ischemic attack (TIA), and cerebral infarction without residual deficits: Secondary | ICD-10-CM | POA: Insufficient documentation

## 2015-03-01 LAB — URINALYSIS, ROUTINE W REFLEX MICROSCOPIC
BILIRUBIN URINE: NEGATIVE
GLUCOSE, UA: NEGATIVE mg/dL
HGB URINE DIPSTICK: NEGATIVE
Ketones, ur: NEGATIVE mg/dL
Leukocytes, UA: NEGATIVE
Nitrite: NEGATIVE
PROTEIN: NEGATIVE mg/dL
Specific Gravity, Urine: 1.012 (ref 1.005–1.030)
UROBILINOGEN UA: 0.2 mg/dL (ref 0.0–1.0)
pH: 6.5 (ref 5.0–8.0)

## 2015-03-01 LAB — CBC WITH DIFFERENTIAL/PLATELET
BASOS ABS: 0 10*3/uL (ref 0.0–0.1)
BASOS PCT: 0 % (ref 0–1)
EOS ABS: 0 10*3/uL (ref 0.0–0.7)
EOS PCT: 0 % (ref 0–5)
HEMATOCRIT: 42.3 % (ref 36.0–46.0)
Hemoglobin: 14.2 g/dL (ref 12.0–15.0)
Lymphocytes Relative: 22 % (ref 12–46)
Lymphs Abs: 0.7 10*3/uL (ref 0.7–4.0)
MCH: 30.5 pg (ref 26.0–34.0)
MCHC: 33.6 g/dL (ref 30.0–36.0)
MCV: 90.8 fL (ref 78.0–100.0)
MONO ABS: 0.4 10*3/uL (ref 0.1–1.0)
MONOS PCT: 13 % — AB (ref 3–12)
Neutro Abs: 2 10*3/uL (ref 1.7–7.7)
Neutrophils Relative %: 65 % (ref 43–77)
PLATELETS: 117 10*3/uL — AB (ref 150–400)
RBC: 4.66 MIL/uL (ref 3.87–5.11)
RDW: 12.7 % (ref 11.5–15.5)
WBC: 3.1 10*3/uL — ABNORMAL LOW (ref 4.0–10.5)

## 2015-03-01 LAB — COMPREHENSIVE METABOLIC PANEL
ALBUMIN: 3.8 g/dL (ref 3.5–5.0)
ALK PHOS: 52 U/L (ref 38–126)
ALT: 20 U/L (ref 14–54)
ANION GAP: 7 (ref 5–15)
AST: 21 U/L (ref 15–41)
BILIRUBIN TOTAL: 0.4 mg/dL (ref 0.3–1.2)
BUN: 12 mg/dL (ref 6–20)
CALCIUM: 8.9 mg/dL (ref 8.9–10.3)
CO2: 26 mmol/L (ref 22–32)
CREATININE: 0.85 mg/dL (ref 0.44–1.00)
Chloride: 105 mmol/L (ref 101–111)
GFR calc Af Amer: >60 mL/min (ref >60)
GFR calc non Af Amer: >60 mL/min (ref >60)
GLUCOSE: 97 mg/dL (ref 65–99)
Potassium: 3.5 mmol/L (ref 3.5–5.1)
SODIUM: 138 mmol/L (ref 135–145)
TOTAL PROTEIN: 6.8 g/dL (ref 6.5–8.1)

## 2015-03-01 LAB — TROPONIN I

## 2015-03-01 LAB — I-STAT CG4 LACTIC ACID, ED: Lactic Acid, Venous: 0.44 mmol/L — ABNORMAL LOW (ref 0.5–2.0)

## 2015-03-01 MED ORDER — PREDNISONE 20 MG PO TABS: 40.0000 mg | ORAL_TABLET | Freq: Every day | ORAL | Status: AC

## 2015-03-01 MED ORDER — GUAIFENESIN-DM 100-10 MG/5ML PO SYRP: 5.0000 mL | ORAL_SOLUTION | ORAL | Status: DC | PRN

## 2015-03-01 NOTE — ED Notes (Signed)
Patient transported to X-ray 

## 2015-03-01 NOTE — Discharge Instructions (Signed)
As discussed, it is important to keep your appointment with your primary care physician later this week.  Today's evaluation has not demonstrated a recurrence of pneumonia, nor evidence for a system wide infection.  However, if you develop new, or concerning changes in your condition, please be sure to return here immediately for additional evaluation.

## 2015-03-01 NOTE — ED Notes (Addendum)
Patient arrived in ER via [facility], per EMS patient was recently treated for pneumonia. Per EMS patients O2 sat was 88% when they arrived, came up to 94% on 3 liters. Patient was reported to have temp of 102.00 and was given Tylenol 1000mg . Per EMS patients lung sounds clear.   Per pt daughter, she called EMS out because patient was showing signs of pneumonia again, cough, fever, and weakness. Daughter also noted that patient has a rash in her genital area that PCP called Nystatin powder for last night.

## 2015-03-01 NOTE — ED Notes (Signed)
PA at bedside.

## 2015-03-01 NOTE — ED Provider Notes (Signed)
CSN: [id number]     Arrival date & time 03/01/15  0807 History   First MD Initiated Contact with Patient 03/01/15 [phone]     Chief Complaint  Patient presents with  . Fever     (Consider location/radiation/quality/duration/timing/severity/associated sxs/prior Treatment) HPI Patient presents with her daughter and a caregiver. These 2 individuals help the history of present illness, as the patient has dementia. 5 caveat. Per report the patient has had persistent cough, fever. She was diagnosed with pneumonia one month ago. She has completed her antibiotics, but over the past days, cough has become more present. No report of new confusion, syncope, vomiting. Patient received Tylenol today, prior to ED arrival.  Past Medical History  Diagnosis Date  . Chest pain     Hospital February, 2012, nuclear, possible anteroseptal and inferoseptal ischemia, technically difficult, medical therapy  . Dyslipidemia   . Orthostatic hypotension   . Migraines     27 year  . Dementia     Significant  . Ejection fraction     EF 55%, echo, February, 2012  //   EF 55-60%, echo, February 25, 2012  . Diastolic dysfunction     Echo, February, 2012  . TIA (transient ischemic attack)        ???question of a TIA in February, 2012, carotid Doppler, August 11, 2010 no significant abnormalities  . GI bleed     February, 2012 incomplete colonoscopy, hemorrhoids, needs complete colonoscopy  . CAD (coronary artery disease)     Probable CAD, nuclear February, 2012, possible anteroseptal and inferoseptal ischemia but the study was technically difficult  . Hyperlipidemia   . Edema     hospitalizations September, 2013, some fluid overload  . DNR (do not resuscitate)   . Major depression     2 overdoses in 1975 & 1977  . Arthritis   . Alzheimer disease    Past Surgical History  Procedure Laterality Date  . Abdominal hysterectomy    . Diagnostic mammogram  12/29/2000    Mammogram, suspicious lesions -BX  pending  . Tonsillectomy     Family History  Problem Relation Age of Onset  . Heart attack Father     several heart attacks  . Hypertension Daughter   . Cancer Father   . Suicidality Mother   . Heart attack Brother   . Heart disease Brother   . Pneumonia Brother   . Suicidality Son   . Arthritis Father   . Depression Other     Most family members   Social History  Substance Use Topics  . Smoking status: Never Smoker   . Smokeless tobacco: Never Used  . Alcohol Use: No   OB History    No data available     Review of Systems  Unable to perform ROS: Dementia      Allergies  Levaquin  Home Medications   Prior to Admission medications   Medication Sig Start Date End Date Taking? Authorizing Provider  acetaminophen (TYLENOL) 325 MG tablet Take 2 tablets (650 mg total) by mouth every 6 (six) hours as needed for mild pain (or Fever >/= 101). 11/04/14   Ashly Windell Moulding, DO  amLODipine (NORVASC) 5 MG tablet Take 5 mg by mouth every morning.    Historical Provider, MD  aspirin EC 81 MG tablet Take 81 mg by mouth every morning.    Historical Provider, MD  cefpodoxime (VANTIN) 200 MG tablet Take 1 tablet (200 mg total) by mouth 2 (two) times daily. 02/05/15  Florencia Reasons, MD  CRANBERRY EXTRACT PO Take 3,600 mg by mouth daily.    Historical Provider, MD  cyanocobalamin 500 MCG tablet Take 500 mcg by mouth daily.    Historical Provider, MD  docusate sodium (COLACE) 100 MG capsule Take 2 capsules (200 mg total) by mouth 2 (two) times daily as needed for mild constipation. 02/05/15   Florencia Reasons, MD  furosemide (LASIX) 40 MG tablet Take 40 mg by mouth every morning. 04/05/12   Liliane Shi, PA-C  isosorbide mononitrate (IMDUR) 30 MG 24 hr tablet TAKE 1/2 TABLET BY MOUTH DAILY CUT FOR PATIENT 04/27/13   Carlena Bjornstad, MD  metoprolol tartrate (LOPRESSOR) 12.5 mg TABS tablet Take 0.5 tablets (12.5 mg total) by mouth 2 (two) times daily. 03/30/13   Carlena Bjornstad, MD  mirtazapine (REMERON) 30  MG tablet Take 30 mg by mouth daily.     Historical Provider, MD  potassium chloride SA (K-DUR,KLOR-CON) 20 MEQ tablet Take 40 mEq by mouth daily. 2 by mouth daily 11/20/14   Gildardo Cranker, DO  pravastatin (PRAVACHOL) 20 MG tablet Take 10 mg by mouth daily. 1/2 by mouth daily    Historical Provider, MD  QUEtiapine (SEROQUEL) 25 MG tablet Take 25 mg by mouth daily. Takes 1 tablet daily    Historical Provider, MD  saccharomyces boulardii (FLORASTOR) 250 MG capsule Take 1 capsule (250 mg total) by mouth 2 (two) times daily. 02/05/15   Florencia Reasons, MD  tamsulosin (FLOMAX) 0.4 MG CAPS capsule Take 0.4 mg by mouth daily.    Historical Provider, MD   BP 136/67 mmHg  Pulse 67  Temp(Src) 98.1 F (36.7 C) (Oral)  Resp 20  SpO2 90% Physical Exam  Constitutional: She has a sickly appearance. No distress.  HENT:  Head: Normocephalic and atraumatic.  Eyes: Conjunctivae and EOM are normal.  Cardiovascular: Normal rate and regular rhythm.   Pulmonary/Chest: Effort normal and breath sounds normal. No stridor. No respiratory distress.  Abdominal: She exhibits no distension.  Musculoskeletal: She exhibits no edema.  Neurological: She is alert. She displays atrophy. She displays no tremor. No cranial nerve deficit. She displays no seizure activity.  Skin: Skin is warm and dry.  Psychiatric: She is withdrawn. Cognition and memory are impaired.  Nursing note and vitals reviewed.   ED Course  Procedures (including critical care time) Labs Review Labs Reviewed  CBC WITH DIFFERENTIAL/PLATELET - Abnormal; Notable for the following:    WBC 3.1 (*)    Platelets 117 (*)    Monocytes Relative 13 (*)    All other components within normal limits  I-STAT CG4 LACTIC ACID, ED - Abnormal; Notable for the following:    Lactic Acid, Venous 0.44 (*)    All other components within normal limits  COMPREHENSIVE METABOLIC PANEL  URINALYSIS, ROUTINE W REFLEX MICROSCOPIC (NOT AT [facility])  TROPONIN I    Imaging Review Dg  Chest 2 View  03/01/2015   CLINICAL DATA:  Fever greater than 102  EXAM: CHEST  2 VIEW  COMPARISON:  02/01/2015  FINDINGS: Upper normal heart size. Bibasilar atelectasis. Increased AP diameter of the chest. No pneumothorax.  IMPRESSION: No active cardiopulmonary disease.   Electronically Signed   By: Marybelle Killings M.D.   On: 03/01/2015 09:26   I have personally reviewed and evaluated these images and lab results as part of my medical decision-making.   EKG Interpretation   Date/Time:  Saturday March 01 2015 09:15:09 EDT Ventricular Rate:  61 PR Interval:  199  QRS Duration: 91 QT Interval:  417 QTC Calculation: 420 R Axis:   86 Text Interpretation:  Sinus rhythm Borderline right axis deviation Low  voltage, precordial leads Sinus rhythm Artifact Low voltage QRS Abnormal  ekg Confirmed by Carmin Muskrat  MD [phone]) on 03/01/2015 9:34:19 AM     Chart review demonstrates evaluation one month ago for pneumonia, and I evaluated the patient 2 months ago after an episode of confusion.    11:52 AM Patient calm, in no distress. I discussed all findings with the patient's daughter and her caregiver. With no x-ray evidence for pneumonia, reassuring labs, with no leukocytosis, no urinary tract infection, additional antibiotics will not be started, though the patient will be monitored carefully by family, and follow-up with primary care within the next 5 days.  MDM  Patient presents with family members who provide history of present illness. Patient has multiple medical issues, including dementia, and recent admission for pneumonia. Here, the patient does have mild cough, but is afebrile, in no distress. No x-ray evidence for persistent pneumonia. Substance physician for viral illness versus bronchitis. Patient has previously scheduled follow-up with her physician in 5 days, and given her persistent cough, some suspicion for bronchitis with without viral coinfection, she was started on a  short course of steroids, cough suppressant. No evidence for bacteremia, sepsis, imminent decompensation.   Carmin Muskrat, MD 03/01/15 [phone]

## 2015-07-01 DIAGNOSIS — N390 Urinary tract infection, site not specified: Secondary | ICD-10-CM | POA: Diagnosis not present

## 2015-07-09 DIAGNOSIS — Z6831 Body mass index (BMI) 31.0-31.9, adult: Secondary | ICD-10-CM | POA: Diagnosis not present

## 2015-07-09 DIAGNOSIS — Z789 Other specified health status: Secondary | ICD-10-CM | POA: Diagnosis not present

## 2015-07-09 DIAGNOSIS — F0390 Unspecified dementia without behavioral disturbance: Secondary | ICD-10-CM | POA: Diagnosis not present

## 2015-07-09 DIAGNOSIS — R350 Frequency of micturition: Secondary | ICD-10-CM | POA: Diagnosis not present

## 2015-07-18 ENCOUNTER — Encounter (HOSPITAL_COMMUNITY): Payer: Self-pay | Admitting: Emergency Medicine

## 2015-07-18 ENCOUNTER — Emergency Department (HOSPITAL_COMMUNITY): Payer: Medicare Other

## 2015-07-18 ENCOUNTER — Emergency Department (HOSPITAL_COMMUNITY)
Admission: EM | Admit: 2015-07-18 | Discharge: 2015-07-19 | Disposition: A | Discharge status: Home / Self Care | Payer: Medicare Other | Attending: Emergency Medicine | Admitting: Emergency Medicine

## 2015-07-18 DIAGNOSIS — Z8673 Personal history of transient ischemic attack (TIA), and cerebral infarction without residual deficits: Secondary | ICD-10-CM | POA: Insufficient documentation

## 2015-07-18 DIAGNOSIS — F0280 Dementia in other diseases classified elsewhere without behavioral disturbance: Secondary | ICD-10-CM | POA: Insufficient documentation

## 2015-07-18 DIAGNOSIS — R0602 Shortness of breath: Secondary | ICD-10-CM | POA: Diagnosis not present

## 2015-07-18 DIAGNOSIS — Z8719 Personal history of other diseases of the digestive system: Secondary | ICD-10-CM | POA: Diagnosis not present

## 2015-07-18 DIAGNOSIS — R5383 Other fatigue: Secondary | ICD-10-CM | POA: Insufficient documentation

## 2015-07-18 DIAGNOSIS — R4182 Altered mental status, unspecified: Secondary | ICD-10-CM | POA: Diagnosis not present

## 2015-07-18 DIAGNOSIS — R05 Cough: Secondary | ICD-10-CM | POA: Diagnosis not present

## 2015-07-18 DIAGNOSIS — M1990 Unspecified osteoarthritis, unspecified site: Secondary | ICD-10-CM | POA: Diagnosis not present

## 2015-07-18 DIAGNOSIS — I5030 Unspecified diastolic (congestive) heart failure: Secondary | ICD-10-CM | POA: Diagnosis not present

## 2015-07-18 DIAGNOSIS — G309 Alzheimer's disease, unspecified: Secondary | ICD-10-CM | POA: Diagnosis not present

## 2015-07-18 DIAGNOSIS — E785 Hyperlipidemia, unspecified: Secondary | ICD-10-CM | POA: Insufficient documentation

## 2015-07-18 DIAGNOSIS — R55 Syncope and collapse: Secondary | ICD-10-CM | POA: Diagnosis not present

## 2015-07-18 DIAGNOSIS — R509 Fever, unspecified: Secondary | ICD-10-CM | POA: Diagnosis not present

## 2015-07-18 DIAGNOSIS — Z792 Long term (current) use of antibiotics: Secondary | ICD-10-CM | POA: Diagnosis not present

## 2015-07-18 DIAGNOSIS — R531 Weakness: Secondary | ICD-10-CM | POA: Diagnosis not present

## 2015-07-18 DIAGNOSIS — I2510 Atherosclerotic heart disease of native coronary artery without angina pectoris: Secondary | ICD-10-CM | POA: Insufficient documentation

## 2015-07-18 DIAGNOSIS — Z7982 Long term (current) use of aspirin: Secondary | ICD-10-CM | POA: Diagnosis not present

## 2015-07-18 DIAGNOSIS — Z79899 Other long term (current) drug therapy: Secondary | ICD-10-CM | POA: Diagnosis not present

## 2015-07-18 DIAGNOSIS — R51 Headache: Secondary | ICD-10-CM | POA: Diagnosis not present

## 2015-07-18 LAB — I-STAT TROPONIN, ED
Troponin i, poc: 0.01 ng/mL (ref 0.00–0.08)
Troponin i, poc: 0.01 ng/mL (ref 0.00–0.08)

## 2015-07-18 LAB — CBC WITH DIFFERENTIAL/PLATELET
BASOS PCT: 0 %
Basophils Absolute: 0 10*3/uL (ref 0.0–0.1)
EOS ABS: 0 10*3/uL (ref 0.0–0.7)
Eosinophils Relative: 0 %
HCT: 43.6 % (ref 36.0–46.0)
HEMOGLOBIN: 15.1 g/dL — AB (ref 12.0–15.0)
Lymphocytes Relative: 27 %
Lymphs Abs: 1.9 10*3/uL (ref 0.7–4.0)
MCH: 31.1 pg (ref 26.0–34.0)
MCHC: 34.6 g/dL (ref 30.0–36.0)
MCV: 89.7 fL (ref 78.0–100.0)
MONOS PCT: 8 %
Monocytes Absolute: 0.5 10*3/uL (ref 0.1–1.0)
NEUTROS PCT: 65 %
Neutro Abs: 4.5 10*3/uL (ref 1.7–7.7)
Platelets: 173 10*3/uL (ref 150–400)
RBC: 4.86 MIL/uL (ref 3.87–5.11)
RDW: 12.5 % (ref 11.5–15.5)
WBC: 6.9 10*3/uL (ref 4.0–10.5)

## 2015-07-18 LAB — URINALYSIS, ROUTINE W REFLEX MICROSCOPIC
Bilirubin Urine: NEGATIVE
GLUCOSE, UA: NEGATIVE mg/dL
Hgb urine dipstick: NEGATIVE
Ketones, ur: NEGATIVE mg/dL
LEUKOCYTES UA: NEGATIVE
Nitrite: NEGATIVE
PH: 7 (ref 5.0–8.0)
Protein, ur: NEGATIVE mg/dL
SPECIFIC GRAVITY, URINE: 1.008 (ref 1.005–1.030)

## 2015-07-18 LAB — COMPREHENSIVE METABOLIC PANEL
ALBUMIN: 4.2 g/dL (ref 3.5–5.0)
ALK PHOS: 61 U/L (ref 38–126)
ALT: 20 U/L (ref 14–54)
ANION GAP: 9 (ref 5–15)
AST: 23 U/L (ref 15–41)
BUN: 14 mg/dL (ref 6–20)
CALCIUM: 9.2 mg/dL (ref 8.9–10.3)
CHLORIDE: 103 mmol/L (ref 101–111)
CO2: 27 mmol/L (ref 22–32)
Creatinine, Ser: 0.91 mg/dL (ref 0.44–1.00)
GFR calc Af Amer: >60 mL/min (ref >60)
GFR calc non Af Amer: 56 mL/min — ABNORMAL LOW (ref >60)
GLUCOSE: 118 mg/dL — AB (ref 65–99)
POTASSIUM: 3.6 mmol/L (ref 3.5–5.1)
SODIUM: 139 mmol/L (ref 135–145)
Total Bilirubin: 0.8 mg/dL (ref 0.3–1.2)
Total Protein: 6.7 g/dL (ref 6.5–8.1)

## 2015-07-18 LAB — I-STAT CG4 LACTIC ACID, ED
LACTIC ACID, VENOUS: 1.78 mmol/L (ref 0.5–2.0)
LACTIC ACID, VENOUS: 0.98 mmol/L (ref 0.5–2.0)

## 2015-07-18 MED ORDER — SODIUM CHLORIDE 0.9 % IV BOLUS (SEPSIS)
500.0000 mL | Freq: Once | INTRAVENOUS | Status: AC
Start: 2015-07-18 — End: 2015-07-18
  Administered 2015-07-18: 500 mL via INTRAVENOUS

## 2015-07-18 NOTE — ED Provider Notes (Signed)
CSN: [id number]     Arrival date & time 07/18/15  1829 History   First MD Initiated Contact with Patient 07/18/15 1833     Chief Complaint  Patient presents with  . Shortness of Breath     (Consider location/radiation/quality/duration/timing/severity/associated sxs/prior Treatment) HPI Comments: Syncope Blood pressures 88/55 checked by home health, HR 110, appeared clammy, diaphoretic, checked sugar EMS was ok Had just finished dinner, was acting normally the had episode Couldn't stand, generally weak No vomiting/diarrhea/black bloody stools No fever/cough Had UTI 2 weeks ago, previous on cipro Chronic sundowning, gets frequent UTIs which causes worsening Complaining of headache, has frequent left sided headaches, treated with heat Now improved back to baseline.    Patient is a 82 y.o. female presenting with shortness of breath.  Shortness of Breath Associated symptoms: headaches   Associated symptoms: no abdominal pain, no chest pain, no cough, no fever, no neck pain, no rash, no sore throat and no vomiting     Past Medical History  Diagnosis Date  . Chest pain     Hospital February, 2012, nuclear, possible anteroseptal and inferoseptal ischemia, technically difficult, medical therapy  . Dyslipidemia   . Orthostatic hypotension   . Migraines     25 year  . Dementia     Significant  . Ejection fraction     EF 55%, echo, February, 2012  //   EF 55-60%, echo, February 25, 2012  . Diastolic dysfunction     Echo, February, 2012  . TIA (transient ischemic attack)        ???question of a TIA in February, 2012, carotid Doppler, August 11, 2010 no significant abnormalities  . GI bleed     February, 2012 incomplete colonoscopy, hemorrhoids, needs complete colonoscopy  . CAD (coronary artery disease)     Probable CAD, nuclear February, 2012, possible anteroseptal and inferoseptal ischemia but the study was technically difficult  . Hyperlipidemia   . Edema      hospitalizations September, 2013, some fluid overload  . DNR (do not resuscitate)   . Major depression ([place])     2 overdoses in 1975 & 1977  . Arthritis   . Alzheimer disease    Past Surgical History  Procedure Laterality Date  . Abdominal hysterectomy    . Diagnostic mammogram  12/29/2000    Mammogram, suspicious lesions -BX pending  . Tonsillectomy     Family History  Problem Relation Age of Onset  . Heart attack Father     several heart attacks  . Hypertension Daughter   . Cancer Father   . Suicidality Mother   . Heart attack Brother   . Heart disease Brother   . Pneumonia Brother   . Suicidality Son   . Arthritis Father   . Depression Other     Most family members   Social History  Substance Use Topics  . Smoking status: Never Smoker   . Smokeless tobacco: Never Used  . Alcohol Use: No   OB History    No data available     Review of Systems  Constitutional: Positive for fatigue. Negative for fever.  HENT: Negative for sore throat.   Eyes: Negative for visual disturbance.  Respiratory: Negative for cough and shortness of breath.   Cardiovascular: Negative for chest pain.  Gastrointestinal: Negative for nausea, vomiting, abdominal pain, diarrhea and constipation.  Genitourinary: Negative for difficulty urinating.  Musculoskeletal: Negative for back pain and neck pain.  Skin: Negative for rash.  Neurological: Positive for syncope,  weakness (generalized weakness) and headaches. Negative for dizziness, seizures, facial asymmetry and numbness.      Allergies  Levaquin  Home Medications   Prior to Admission medications   Medication Sig Start Date End Date Taking? Authorizing Provider  acetaminophen (TYLENOL) 325 MG tablet Take 2 tablets (650 mg total) by mouth every 6 (six) hours as needed for mild pain (or Fever >/= 101). 11/04/14  Yes Ashly M Gottschalk, DO  amLODipine (NORVASC) 5 MG tablet Take 5 mg by mouth every morning.   Yes Historical Provider, MD   aspirin EC 81 MG tablet Take 81 mg by mouth every morning.   Yes Historical Provider, MD  CRANBERRY EXTRACT PO Take 4,600 mg by mouth every morning.    Yes Historical Provider, MD  cyanocobalamin 500 MCG tablet Take 500 mcg by mouth every morning.    Yes Historical Provider, MD  docusate sodium (COLACE) 100 MG capsule Take 2 capsules (200 mg total) by mouth 2 (two) times daily as needed for mild constipation. Patient taking differently: Take 200 mg by mouth at bedtime.  02/05/15  Yes Florencia Reasons, MD  furosemide (LASIX) 40 MG tablet Take 40 mg by mouth every morning. 04/05/12  Yes Scott T Weaver, PA-C  guaiFENesin-dextromethorphan (ROBITUSSIN DM) 100-10 MG/5ML syrup Take 5 mLs by mouth every 4 (four) hours as needed for cough. 03/01/15  Yes Carmin Muskrat, MD  isosorbide mononitrate (IMDUR) 30 MG 24 hr tablet TAKE 1/2 TABLET BY MOUTH DAILY CUT FOR PATIENT Patient taking differently: Take 0.5 tablet by mouth daily. 04/27/13  Yes Carlena Bjornstad, MD  metoprolol tartrate (LOPRESSOR) 25 MG tablet Take 12.5 mg by mouth 2 (two) times daily.   Yes Historical Provider, MD  mirtazapine (REMERON) 7.5 MG tablet Take 7.5 mg by mouth daily. At 4 pm.   Yes Historical Provider, MD  neomycin-bacitracin-polymyxin (NEOSPORIN) ointment Apply 1 application topically daily. On face.   Yes Historical Provider, MD  potassium chloride SA (K-DUR,KLOR-CON) 20 MEQ tablet Take 40 mEq by mouth every morning.  11/20/14  Yes Gildardo Cranker, DO  pravastatin (PRAVACHOL) 20 MG tablet Take 10 mg by mouth every evening.    Yes Historical Provider, MD  QUEtiapine (SEROQUEL) 25 MG tablet Take 25 mg by mouth every morning. @900  in the morning.   Yes Historical Provider, MD   BP 146/58 mmHg  Pulse 70  Temp(Src) 97.5 F (36.4 C) (Oral)  Resp 17  SpO2 95% Physical Exam  Constitutional: She is oriented to person, place, and time. She appears well-developed and well-nourished. No distress.  HENT:  Head: Normocephalic and atraumatic.  Eyes:  Conjunctivae and EOM are normal.  Neck: Normal range of motion.  Cardiovascular: Normal rate, regular rhythm, normal heart sounds and intact distal pulses.  Exam reveals no gallop and no friction rub.   No murmur heard. Pulmonary/Chest: Effort normal and breath sounds normal. No respiratory distress. She has no wheezes. She has no rales.  Abdominal: Soft. She exhibits no distension. There is no tenderness. There is no guarding.  Musculoskeletal: She exhibits no edema or tenderness.  Neurological: She is alert and oriented to person, place, and time. She has normal strength. She displays no tremor. No cranial nerve deficit or sensory deficit. Coordination normal. GCS eye subscore is 4. GCS verbal subscore is 5. GCS motor subscore is 6.  Skin: Skin is warm and dry. No rash noted. She is not diaphoretic. No erythema.  Nursing note and vitals reviewed.   ED Course  Procedures (including critical  care time) Labs Review Labs Reviewed  CBC WITH DIFFERENTIAL/PLATELET - Abnormal; Notable for the following:    Hemoglobin 15.1 (*)    All other components within normal limits  COMPREHENSIVE METABOLIC PANEL - Abnormal; Notable for the following:    Glucose, Bld 118 (*)    GFR calc non Af Amer 56 (*)    All other components within normal limits  URINE CULTURE  URINALYSIS, ROUTINE W REFLEX MICROSCOPIC (NOT AT [facility])  I-STAT TROPOININ, ED  I-STAT CG4 LACTIC ACID, ED  I-STAT TROPOININ, ED  Randolm Idol, ED  I-STAT CG4 LACTIC ACID, ED    Imaging Review Dg Chest 2 View  07/18/2015  CLINICAL DATA:  Persistent cough and fever. Diagnosed with pneumonia 1 month ago. Dementia. EXAM: CHEST  2 VIEW COMPARISON:  03/01/2015 FINDINGS: Shallow inspiration with atelectasis in the lung bases. No focal consolidation is suggested. No blunting of costophrenic angles. No pneumothorax. Normal heart size and pulmonary vascularity. Calcification of the aorta. Degenerative changes in the spine. IMPRESSION: Shallow  inspiration with atelectasis in the lung bases. Electronically Signed   By: Lucienne Capers M.D.   On: 07/18/2015 19:51   Ct Head Wo Contrast  07/18/2015  CLINICAL DATA:  Altered mental status. Shortness of breath. Lethargy and almost passed out. History of dementia. EXAM: CT HEAD WITHOUT CONTRAST TECHNIQUE: Contiguous axial images were obtained from the base of the skull through the vertex without intravenous contrast. COMPARISON:  01/18/2015 FINDINGS: Diffuse cerebral atrophy. Mild ventricular dilatation consistent with central atrophy. Low-attenuation changes in the deep white matter consistent with small vessel ischemia. No mass effect or midline shift. No abnormal extra-axial fluid collections. Gray-white matter junctions are distinct. Basal cisterns are not effaced. No evidence of acute intracranial hemorrhage. No depressed skull fractures. Visualized paranasal sinuses and mastoid air cells are not opacified. IMPRESSION: No acute intracranial abnormalities. Chronic atrophy and small vessel ischemic changes. Electronically Signed   By: Lucienne Capers M.D.   On: 07/18/2015 19:58   I have personally reviewed and evaluated these images and lab results as part of my medical decision-making.   EKG Interpretation   Date/Time:  Friday July 18 2015 18:47:09 EST Ventricular Rate:  68 PR Interval:  44 QRS Duration: 92 QT Interval:  421 QTC Calculation: 448 R Axis:   67 Text Interpretation:  Sinus rhythm Low voltage, precordial leads No  significant change since last tracing Artifact Confirmed by [facility] MD,  Keyani Rigdon ([id number]) on 07/19/2015 3:16:56 AM      MDM   Final diagnoses:  Syncope, unspecified syncope type   82 year old female with history of coronary artery diseases, diastolic CHF, hyperlipidemia, depression, possible TIA, dementia presents with concern for episode of syncope.   EKG evaluated by me and shows sinus rhythm with no sign of prolonged QTc, no brugada, no sign of HOCM, no  ST abnormalities.  Chest x-ray was done which showed no sign pneumonia. Urinalysis shows no signs of UTI. Patient afebrile, without leukocytosis, normal lactic acid, low suspicion for infectious etiology of this episode. Reports mild headache, CT head negative. Doubt SAH by hx of mild HA and neg CT soon after syncopal event. Electrolytes are within normal limits. 2 troponins were both negative, patient does not have chest pain, and doubt PE or MI as etiology of syncope. Doubt seizure by hx and negative lactic acid.  Patient without sign of arrhythmia on telemetry, however discussed with daughter that cannot completely rule out cardiac arrhythmia as cause of patient's episode, however daughter and I  agree that admission (or if discovery or arrhythmias found, pacemaker/defibrillator is in line with patient's wishes.)  Patient with question of hypotension on both assessment by home health and arrival to ED. Possible vasovagal episode vs other arrhythmia. Patient at baseline.  No other concerns for bleed, infection, CVA, significant dehydration.  Patient discharged in stable condition with understanding of reasons to return.   Gareth Morgan, MD 07/19/15 [phone]

## 2015-07-18 NOTE — ED Notes (Signed)
MD at bedside. 

## 2015-07-18 NOTE — ED Notes (Signed)
VIA ems with SOB. Per care taker 30-1 hr pt with lethargy almost passed out. Right lower Lobe absent, other lobes are clear. 12 lead unremarkable. CBG 118. Alert oriented.    124/70 BP 67 HR 100 2L [place]

## 2015-07-18 NOTE — Discharge Instructions (Signed)

## 2015-07-20 LAB — URINE CULTURE: CULTURE: NO GROWTH

## 2015-07-21 DIAGNOSIS — Z1211 Encounter for screening for malignant neoplasm of colon: Secondary | ICD-10-CM | POA: Diagnosis not present

## 2015-07-21 DIAGNOSIS — R5383 Other fatigue: Secondary | ICD-10-CM | POA: Diagnosis not present

## 2015-07-21 DIAGNOSIS — Z1389 Encounter for screening for other disorder: Secondary | ICD-10-CM | POA: Diagnosis not present

## 2015-07-21 DIAGNOSIS — E7800 Pure hypercholesterolemia, unspecified: Secondary | ICD-10-CM | POA: Diagnosis not present

## 2015-07-21 DIAGNOSIS — Z0000 Encounter for general adult medical examination without abnormal findings: Secondary | ICD-10-CM | POA: Diagnosis not present

## 2015-07-21 DIAGNOSIS — E559 Vitamin D deficiency, unspecified: Secondary | ICD-10-CM | POA: Diagnosis not present

## 2015-07-21 DIAGNOSIS — Z6831 Body mass index (BMI) 31.0-31.9, adult: Secondary | ICD-10-CM | POA: Diagnosis not present

## 2015-07-21 DIAGNOSIS — F329 Major depressive disorder, single episode, unspecified: Secondary | ICD-10-CM | POA: Diagnosis not present

## 2015-07-21 DIAGNOSIS — Z7189 Other specified counseling: Secondary | ICD-10-CM | POA: Diagnosis not present

## 2015-07-21 DIAGNOSIS — Z418 Encounter for other procedures for purposes other than remedying health state: Secondary | ICD-10-CM | POA: Diagnosis not present

## 2015-08-16 ENCOUNTER — Emergency Department (HOSPITAL_COMMUNITY)
Admission: EM | Admit: 2015-08-16 | Discharge: 2015-08-16 | Disposition: A | Discharge status: Home / Self Care | Payer: Medicare Other | Attending: Emergency Medicine | Admitting: Emergency Medicine

## 2015-08-16 ENCOUNTER — Emergency Department (HOSPITAL_COMMUNITY): Payer: Medicare Other

## 2015-08-16 ENCOUNTER — Encounter (HOSPITAL_COMMUNITY): Payer: Self-pay

## 2015-08-16 DIAGNOSIS — Z792 Long term (current) use of antibiotics: Secondary | ICD-10-CM | POA: Diagnosis not present

## 2015-08-16 DIAGNOSIS — R531 Weakness: Secondary | ICD-10-CM | POA: Diagnosis not present

## 2015-08-16 DIAGNOSIS — Z79899 Other long term (current) drug therapy: Secondary | ICD-10-CM | POA: Insufficient documentation

## 2015-08-16 DIAGNOSIS — Z8673 Personal history of transient ischemic attack (TIA), and cerebral infarction without residual deficits: Secondary | ICD-10-CM | POA: Insufficient documentation

## 2015-08-16 DIAGNOSIS — G43909 Migraine, unspecified, not intractable, without status migrainosus: Secondary | ICD-10-CM | POA: Diagnosis not present

## 2015-08-16 DIAGNOSIS — F0280 Dementia in other diseases classified elsewhere without behavioral disturbance: Secondary | ICD-10-CM | POA: Insufficient documentation

## 2015-08-16 DIAGNOSIS — Z7982 Long term (current) use of aspirin: Secondary | ICD-10-CM | POA: Diagnosis not present

## 2015-08-16 DIAGNOSIS — R0602 Shortness of breath: Secondary | ICD-10-CM

## 2015-08-16 DIAGNOSIS — E785 Hyperlipidemia, unspecified: Secondary | ICD-10-CM | POA: Insufficient documentation

## 2015-08-16 DIAGNOSIS — F329 Major depressive disorder, single episode, unspecified: Secondary | ICD-10-CM | POA: Diagnosis not present

## 2015-08-16 DIAGNOSIS — G309 Alzheimer's disease, unspecified: Secondary | ICD-10-CM | POA: Diagnosis not present

## 2015-08-16 DIAGNOSIS — R5383 Other fatigue: Secondary | ICD-10-CM | POA: Diagnosis present

## 2015-08-16 DIAGNOSIS — M1990 Unspecified osteoarthritis, unspecified site: Secondary | ICD-10-CM | POA: Insufficient documentation

## 2015-08-16 DIAGNOSIS — I2510 Atherosclerotic heart disease of native coronary artery without angina pectoris: Secondary | ICD-10-CM | POA: Diagnosis not present

## 2015-08-16 DIAGNOSIS — B349 Viral infection, unspecified: Secondary | ICD-10-CM | POA: Insufficient documentation

## 2015-08-16 DIAGNOSIS — R404 Transient alteration of awareness: Secondary | ICD-10-CM | POA: Diagnosis not present

## 2015-08-16 LAB — BASIC METABOLIC PANEL
ANION GAP: 7 (ref 5–15)
BUN: 11 mg/dL (ref 6–20)
CALCIUM: 8.4 mg/dL — AB (ref 8.9–10.3)
CO2: 24 mmol/L (ref 22–32)
Chloride: 111 mmol/L (ref 101–111)
Creatinine, Ser: 0.79 mg/dL (ref 0.44–1.00)
GLUCOSE: 91 mg/dL (ref 65–99)
POTASSIUM: 4.4 mmol/L (ref 3.5–5.1)
SODIUM: 142 mmol/L (ref 135–145)

## 2015-08-16 LAB — URINALYSIS, ROUTINE W REFLEX MICROSCOPIC
Bilirubin Urine: NEGATIVE
Glucose, UA: NEGATIVE mg/dL
Hgb urine dipstick: NEGATIVE
KETONES UR: NEGATIVE mg/dL
LEUKOCYTES UA: NEGATIVE
NITRITE: NEGATIVE
PROTEIN: NEGATIVE mg/dL
Specific Gravity, Urine: 1.005 (ref 1.005–1.030)
pH: 6 (ref 5.0–8.0)

## 2015-08-16 LAB — CBC
HEMATOCRIT: 40.8 % (ref 36.0–46.0)
HEMOGLOBIN: 13.8 g/dL (ref 12.0–15.0)
MCH: 31 pg (ref 26.0–34.0)
MCHC: 33.8 g/dL (ref 30.0–36.0)
MCV: 91.7 fL (ref 78.0–100.0)
Platelets: 137 10*3/uL — ABNORMAL LOW (ref 150–400)
RBC: 4.45 MIL/uL (ref 3.87–5.11)
RDW: 12.8 % (ref 11.5–15.5)
WBC: 3.7 10*3/uL — AB (ref 4.0–10.5)

## 2015-08-16 LAB — TROPONIN I

## 2015-08-16 LAB — I-STAT CG4 LACTIC ACID, ED: LACTIC ACID, VENOUS: 0.71 mmol/L (ref 0.5–2.0)

## 2015-08-16 MED ORDER — SODIUM CHLORIDE 0.9 % IV SOLN
INTRAVENOUS | Status: DC
  Administered 2015-08-16: 18:00:00 via INTRAVENOUS

## 2015-08-16 MED ORDER — OSELTAMIVIR PHOSPHATE 75 MG PO CAPS: 75.0000 mg | ORAL_CAPSULE | Freq: Two times a day (BID) | ORAL | Status: DC

## 2015-08-16 NOTE — ED Notes (Signed)
Meal tray given to patient.

## 2015-08-16 NOTE — ED Notes (Signed)
Bed: WA20 Expected date:  Expected time:  Means of arrival:  Comments: EMS- 82 yo AMS

## 2015-08-16 NOTE — Discharge Instructions (Signed)

## 2015-08-16 NOTE — ED Notes (Signed)
Patient presents to ED by EMS with dizziness, weakness, and diarrhea. Patient has hx of alzheimer's. Patient not on O2 at home.

## 2015-08-16 NOTE — ED Provider Notes (Signed)
CSN: [id number]     Arrival date & time 08/16/15  1601 History   First MD Initiated Contact with Patient 08/16/15 1641     Chief Complaint  Patient presents with  . Fatigue     (Consider location/radiation/quality/duration/timing/severity/associated sxs/prior Treatment) HPI Comments: Patient here with 24 hours of cough congestion dizziness and weakness and watery diarrhea. Does have history of Alzheimer's and family states that she has had decreased level of consciousness. She's not had any vomiting. No rashes noted. She is not complaining of any abdominal or chest discomfort. Symptoms have been persistent and nothing makes them better worse. No treatment used prior to arrival  The history is provided by the patient and a relative.    Past Medical History  Diagnosis Date  . Chest pain     Hospital February, 2012, nuclear, possible anteroseptal and inferoseptal ischemia, technically difficult, medical therapy  . Dyslipidemia   . Orthostatic hypotension   . Migraines     25 year  . Dementia     Significant  . Ejection fraction     EF 55%, echo, February, 2012  //   EF 55-60%, echo, February 25, 2012  . Diastolic dysfunction     Echo, February, 2012  . TIA (transient ischemic attack)        ???question of a TIA in February, 2012, carotid Doppler, August 11, 2010 no significant abnormalities  . GI bleed     February, 2012 incomplete colonoscopy, hemorrhoids, needs complete colonoscopy  . CAD (coronary artery disease)     Probable CAD, nuclear February, 2012, possible anteroseptal and inferoseptal ischemia but the study was technically difficult  . Hyperlipidemia   . Edema     hospitalizations September, 2013, some fluid overload  . DNR (do not resuscitate)   . Major depression ([place])     2 overdoses in 1975 & 1977  . Arthritis   . Alzheimer disease    Past Surgical History  Procedure Laterality Date  . Abdominal hysterectomy    . Diagnostic mammogram  12/29/2000   Mammogram, suspicious lesions -BX pending  . Tonsillectomy     Family History  Problem Relation Age of Onset  . Heart attack Father     several heart attacks  . Hypertension Daughter   . Cancer Father   . Suicidality Mother   . Heart attack Brother   . Heart disease Brother   . Pneumonia Brother   . Suicidality Son   . Arthritis Father   . Depression Other     Most family members   Social History  Substance Use Topics  . Smoking status: Never Smoker   . Smokeless tobacco: Never Used  . Alcohol Use: No   OB History    No data available     Review of Systems  All other systems reviewed and are negative.     Allergies  Levaquin  Home Medications   Prior to Admission medications   Medication Sig Start Date End Date Taking? Authorizing Provider  acetaminophen (TYLENOL) 325 MG tablet Take 2 tablets (650 mg total) by mouth every 6 (six) hours as needed for mild pain (or Fever >/= 101). 11/04/14   Ashly Windell Moulding, DO  amLODipine (NORVASC) 5 MG tablet Take 5 mg by mouth every morning.    Historical Provider, MD  aspirin EC 81 MG tablet Take 81 mg by mouth every morning.    Historical Provider, MD  CRANBERRY EXTRACT PO Take 4,600 mg by mouth every morning.  Historical Provider, MD  cyanocobalamin 500 MCG tablet Take 500 mcg by mouth every morning.     Historical Provider, MD  docusate sodium (COLACE) 100 MG capsule Take 2 capsules (200 mg total) by mouth 2 (two) times daily as needed for mild constipation. Patient taking differently: Take 200 mg by mouth at bedtime.  02/05/15   Florencia Reasons, MD  furosemide (LASIX) 40 MG tablet Take 40 mg by mouth every morning. 04/05/12   Scott Joylene Draft, PA-C  guaiFENesin-dextromethorphan (ROBITUSSIN DM) 100-10 MG/5ML syrup Take 5 mLs by mouth every 4 (four) hours as needed for cough. 03/01/15   Carmin Muskrat, MD  isosorbide mononitrate (IMDUR) 30 MG 24 hr tablet TAKE 1/2 TABLET BY MOUTH DAILY CUT FOR PATIENT Patient taking differently: Take  0.5 tablet by mouth daily. 04/27/13   Carlena Bjornstad, MD  metoprolol tartrate (LOPRESSOR) 25 MG tablet Take 12.5 mg by mouth 2 (two) times daily.    Historical Provider, MD  mirtazapine (REMERON) 7.5 MG tablet Take 7.5 mg by mouth daily. At 4 pm.    Historical Provider, MD  neomycin-bacitracin-polymyxin (NEOSPORIN) ointment Apply 1 application topically daily. On face.    Historical Provider, MD  potassium chloride SA (K-DUR,KLOR-CON) 20 MEQ tablet Take 40 mEq by mouth every morning.  11/20/14   Gildardo Cranker, DO  pravastatin (PRAVACHOL) 20 MG tablet Take 10 mg by mouth every evening.     Historical Provider, MD  QUEtiapine (SEROQUEL) 25 MG tablet Take 25 mg by mouth every morning. @900  in the morning.    Historical Provider, MD   BP 141/73 mmHg  Pulse 66  Temp(Src) 97.3 F (36.3 C) (Oral)  Resp 18  SpO2 98% Physical Exam  Constitutional: She is oriented to person, place, and time. She appears well-developed and well-nourished.  Non-toxic appearance. She has a sickly appearance. No distress.  HENT:  Head: Normocephalic and atraumatic.  Eyes: Conjunctivae, EOM and lids are normal. Pupils are equal, round, and reactive to light.  Neck: Normal range of motion. Neck supple. No tracheal deviation present. No thyroid mass present.  Cardiovascular: Normal rate, regular rhythm and normal heart sounds.  Exam reveals no gallop.   No murmur heard. Pulmonary/Chest: Effort normal and breath sounds normal. No stridor. No respiratory distress. She has no decreased breath sounds. She has no wheezes. She has no rhonchi. She has no rales.  Abdominal: Soft. Normal appearance and bowel sounds are normal. She exhibits no distension. There is no tenderness. There is no rigidity, no rebound, no guarding and no CVA tenderness.  Musculoskeletal: Normal range of motion. She exhibits no edema or tenderness.  Neurological: She is alert and oriented to person, place, and time. She has normal strength. No cranial nerve  deficit or sensory deficit. GCS eye subscore is 4. GCS verbal subscore is 5. GCS motor subscore is 6.  Skin: Skin is warm and dry. No abrasion and no rash noted.  Psychiatric: Her affect is blunt. Her speech is delayed. She is slowed.  Nursing note and vitals reviewed.   ED Course  Procedures (including critical care time) Labs Review Labs Reviewed  CBC - Abnormal; Notable for the following:    WBC 3.7 (*)    Platelets 137 (*)    All other components within normal limits  URINE CULTURE  BASIC METABOLIC PANEL  TROPONIN I  URINALYSIS, ROUTINE W REFLEX MICROSCOPIC (NOT AT [facility])  I-STAT CG4 LACTIC ACID, ED    Imaging Review No results found. I have personally reviewed and  evaluated these images and lab results as part of my medical decision-making.   EKG Interpretation None      MDM   Final diagnoses:  SOB (shortness of breath)    Patient without evidence of sepsis at this time. Will be started on Tamiflu and encouraged to follow-up with her Dr.    Lacretia Leigh, MD 08/16/15 [phone]

## 2015-08-18 LAB — URINE CULTURE: Culture: NO GROWTH

## 2015-09-03 DIAGNOSIS — R300 Dysuria: Secondary | ICD-10-CM | POA: Diagnosis not present

## 2015-09-03 DIAGNOSIS — R5383 Other fatigue: Secondary | ICD-10-CM | POA: Diagnosis not present

## 2015-09-16 ENCOUNTER — Encounter (HOSPITAL_COMMUNITY): Payer: Self-pay | Admitting: Emergency Medicine

## 2015-09-16 ENCOUNTER — Emergency Department (HOSPITAL_COMMUNITY): Payer: Medicare Other

## 2015-09-16 ENCOUNTER — Emergency Department (HOSPITAL_COMMUNITY)
Admission: EM | Admit: 2015-09-16 | Discharge: 2015-09-16 | Disposition: A | Discharge status: Home / Self Care | Payer: Medicare Other | Attending: Emergency Medicine | Admitting: Emergency Medicine

## 2015-09-16 DIAGNOSIS — F0280 Dementia in other diseases classified elsewhere without behavioral disturbance: Secondary | ICD-10-CM | POA: Insufficient documentation

## 2015-09-16 DIAGNOSIS — R0602 Shortness of breath: Secondary | ICD-10-CM | POA: Insufficient documentation

## 2015-09-16 DIAGNOSIS — R0789 Other chest pain: Secondary | ICD-10-CM | POA: Diagnosis not present

## 2015-09-16 DIAGNOSIS — M545 Low back pain: Secondary | ICD-10-CM | POA: Diagnosis not present

## 2015-09-16 DIAGNOSIS — R51 Headache: Secondary | ICD-10-CM | POA: Insufficient documentation

## 2015-09-16 DIAGNOSIS — E785 Hyperlipidemia, unspecified: Secondary | ICD-10-CM | POA: Diagnosis not present

## 2015-09-16 DIAGNOSIS — R42 Dizziness and giddiness: Secondary | ICD-10-CM | POA: Insufficient documentation

## 2015-09-16 DIAGNOSIS — R079 Chest pain, unspecified: Secondary | ICD-10-CM | POA: Diagnosis not present

## 2015-09-16 DIAGNOSIS — R55 Syncope and collapse: Secondary | ICD-10-CM | POA: Diagnosis not present

## 2015-09-16 DIAGNOSIS — R404 Transient alteration of awareness: Secondary | ICD-10-CM | POA: Diagnosis not present

## 2015-09-16 DIAGNOSIS — R001 Bradycardia, unspecified: Secondary | ICD-10-CM | POA: Diagnosis not present

## 2015-09-16 DIAGNOSIS — R531 Weakness: Secondary | ICD-10-CM | POA: Insufficient documentation

## 2015-09-16 DIAGNOSIS — M1990 Unspecified osteoarthritis, unspecified site: Secondary | ICD-10-CM | POA: Insufficient documentation

## 2015-09-16 DIAGNOSIS — F329 Major depressive disorder, single episode, unspecified: Secondary | ICD-10-CM | POA: Diagnosis not present

## 2015-09-16 DIAGNOSIS — G309 Alzheimer's disease, unspecified: Secondary | ICD-10-CM | POA: Insufficient documentation

## 2015-09-16 DIAGNOSIS — Z7982 Long term (current) use of aspirin: Secondary | ICD-10-CM | POA: Diagnosis not present

## 2015-09-16 DIAGNOSIS — I2510 Atherosclerotic heart disease of native coronary artery without angina pectoris: Secondary | ICD-10-CM | POA: Insufficient documentation

## 2015-09-16 DIAGNOSIS — Z79899 Other long term (current) drug therapy: Secondary | ICD-10-CM | POA: Diagnosis not present

## 2015-09-16 DIAGNOSIS — Z8673 Personal history of transient ischemic attack (TIA), and cerebral infarction without residual deficits: Secondary | ICD-10-CM | POA: Insufficient documentation

## 2015-09-16 LAB — I-STAT TROPONIN, ED
TROPONIN I, POC: 0 ng/mL (ref 0.00–0.08)
Troponin i, poc: 0 ng/mL (ref 0.00–0.08)
Troponin i, poc: 0 ng/mL (ref 0.00–0.08)

## 2015-09-16 LAB — BASIC METABOLIC PANEL
ANION GAP: 9 (ref 5–15)
BUN: 9 mg/dL (ref 6–20)
CHLORIDE: 107 mmol/L (ref 101–111)
CO2: 27 mmol/L (ref 22–32)
Calcium: 9.7 mg/dL (ref 8.9–10.3)
Creatinine, Ser: 0.78 mg/dL (ref 0.44–1.00)
GFR calc non Af Amer: >60 mL/min (ref >60)
Glucose, Bld: 97 mg/dL (ref 65–99)
Potassium: 4.2 mmol/L (ref 3.5–5.1)
SODIUM: 143 mmol/L (ref 135–145)

## 2015-09-16 LAB — URINALYSIS, ROUTINE W REFLEX MICROSCOPIC
Bilirubin Urine: NEGATIVE
GLUCOSE, UA: NEGATIVE mg/dL
HGB URINE DIPSTICK: NEGATIVE
Ketones, ur: NEGATIVE mg/dL
Nitrite: NEGATIVE
PROTEIN: NEGATIVE mg/dL
SPECIFIC GRAVITY, URINE: 1.007 (ref 1.005–1.030)
pH: 6 (ref 5.0–8.0)

## 2015-09-16 LAB — CBC
HCT: 45.6 % (ref 36.0–46.0)
HEMOGLOBIN: 15.8 g/dL — AB (ref 12.0–15.0)
MCH: 31.1 pg (ref 26.0–34.0)
MCHC: 34.6 g/dL (ref 30.0–36.0)
MCV: 89.8 fL (ref 78.0–100.0)
Platelets: 184 10*3/uL (ref 150–400)
RBC: 5.08 MIL/uL (ref 3.87–5.11)
RDW: 12.7 % (ref 11.5–15.5)
WBC: 4.6 10*3/uL (ref 4.0–10.5)

## 2015-09-16 LAB — URINE MICROSCOPIC-ADD ON

## 2015-09-16 LAB — I-STAT CG4 LACTIC ACID, ED
LACTIC ACID, VENOUS: 1.84 mmol/L (ref 0.5–2.0)
LACTIC ACID, VENOUS: 0.82 mmol/L (ref 0.5–2.0)

## 2015-09-16 MED ORDER — SODIUM CHLORIDE 0.9 % IV BOLUS (SEPSIS)
500.0000 mL | Freq: Once | INTRAVENOUS | Status: AC
  Administered 2015-09-16: 500 mL via INTRAVENOUS

## 2015-09-16 NOTE — ED Provider Notes (Signed)
CSN: [id number]     Arrival date & time 09/16/15  1041 History   First MD Initiated Contact with Patient 09/16/15 1123     Chief Complaint  Patient presents with  . Weakness     (Consider location/radiation/quality/duration/timing/severity/associated sxs/prior Treatment) HPI Cp/HA/almost fell in bathroom, so weak all over Said shortness of breath-- Breathing fast at the time, chest pain 750AM today, 10-15 minutes of shortness of breath, weakness all over. Unable to characterize pain.  Headache mild, has hx of headaches on and off, prior CT negative.  Hx of severe headaches when younger requiring demerol. Fainting episodes over last few months Saturday syncope, while sitting down. Has had several episodes--discussed that may be heart related but pt with dementia/DNR, would not want a defibrillator  On beta blocker, .5 in am and also at night, caregiver noticed that heart rate is slower today than normal  No pain right now   Past Medical History  Diagnosis Date  . Chest pain     Hospital February, 2012, nuclear, possible anteroseptal and inferoseptal ischemia, technically difficult, medical therapy  . Dyslipidemia   . Orthostatic hypotension   . Migraines     27 year  . Dementia     Significant  . Ejection fraction     EF 55%, echo, February, 2012  //   EF 55-60%, echo, February 25, 2012  . Diastolic dysfunction     Echo, February, 2012  . TIA (transient ischemic attack)        ???question of a TIA in February, 2012, carotid Doppler, August 11, 2010 no significant abnormalities  . GI bleed     February, 2012 incomplete colonoscopy, hemorrhoids, needs complete colonoscopy  . CAD (coronary artery disease)     Probable CAD, nuclear February, 2012, possible anteroseptal and inferoseptal ischemia but the study was technically difficult  . Hyperlipidemia   . Edema     hospitalizations September, 2013, some fluid overload  . DNR (do not resuscitate)   . Major depression ([place])      2 overdoses in 1975 & 1977  . Arthritis   . Alzheimer disease    Past Surgical History  Procedure Laterality Date  . Abdominal hysterectomy    . Diagnostic mammogram  12/29/2000    Mammogram, suspicious lesions -BX pending  . Tonsillectomy     Family History  Problem Relation Age of Onset  . Heart attack Father     several heart attacks  . Hypertension Daughter   . Cancer Father   . Suicidality Mother   . Heart attack Brother   . Heart disease Brother   . Pneumonia Brother   . Suicidality Son   . Arthritis Father   . Depression Other     Most family members   Social History  Substance Use Topics  . Smoking status: Never Smoker   . Smokeless tobacco: Never Used  . Alcohol Use: No   OB History    No data available     Review of Systems  Constitutional: Positive for appetite change and fatigue. Negative for fever.  HENT: Negative for sore throat.   Eyes: Negative for visual disturbance.  Respiratory: Positive for shortness of breath. Negative for cough.   Cardiovascular: Positive for chest pain.  Gastrointestinal: Negative for nausea, vomiting, abdominal pain, diarrhea, constipation and blood in stool.  Genitourinary: Negative for difficulty urinating.  Musculoskeletal: Positive for back pain (lower). Negative for neck pain.  Skin: Negative for rash.  Neurological: Positive for syncope, light-headedness  and headaches. Negative for facial asymmetry, weakness (generalized weakness) and numbness.      Allergies  Levaquin  Home Medications   Prior to Admission medications   Medication Sig Start Date End Date Taking? Authorizing Provider  acetaminophen (TYLENOL) 325 MG tablet Take 2 tablets (650 mg total) by mouth every 6 (six) hours as needed for mild pain (or Fever >/= 101). 11/04/14  Yes Ashly M Gottschalk, DO  amLODipine (NORVASC) 5 MG tablet Take 2.5 mg by mouth every morning.    Yes Historical Provider, MD  aspirin EC 81 MG tablet Take 81 mg by mouth every  morning.   Yes Historical Provider, MD  CRANBERRY EXTRACT PO Take 4,600 mg by mouth every morning.    Yes Historical Provider, MD  cyanocobalamin 500 MCG tablet Take 500 mcg by mouth every morning.    Yes Historical Provider, MD  docusate sodium (COLACE) 100 MG capsule Take 2 capsules (200 mg total) by mouth 2 (two) times daily as needed for mild constipation. Patient taking differently: Take 200 mg by mouth at bedtime.  02/05/15  Yes Florencia Reasons, MD  furosemide (LASIX) 40 MG tablet Take 40 mg by mouth every morning. 04/05/12  Yes Scott T Weaver, PA-C  guaiFENesin-dextromethorphan (ROBITUSSIN DM) 100-10 MG/5ML syrup Take 5 mLs by mouth every 4 (four) hours as needed for cough. 03/01/15  Yes Carmin Muskrat, MD  isosorbide mononitrate (IMDUR) 30 MG 24 hr tablet TAKE 1/2 TABLET BY MOUTH DAILY CUT FOR PATIENT Patient taking differently: Take 0.5 tablet by mouth daily. 04/27/13  Yes Carlena Bjornstad, MD  mirtazapine (REMERON) 7.5 MG tablet Take 7.5 mg by mouth daily. At 4 pm.   Yes Historical Provider, MD  neomycin-bacitracin-polymyxin (NEOSPORIN) ointment Apply 1 application topically daily. On face.   Yes Historical Provider, MD  potassium chloride SA (K-DUR,KLOR-CON) 20 MEQ tablet Take 40 mEq by mouth every morning.  11/20/14  Yes Gildardo Cranker, DO  pravastatin (PRAVACHOL) 20 MG tablet Take 10 mg by mouth every evening.    Yes Historical Provider, MD  QUEtiapine (SEROQUEL) 25 MG tablet Take 25 mg by mouth every morning. @900  in the morning.   Yes Historical Provider, MD  oseltamivir (TAMIFLU) 75 MG capsule Take 1 capsule (75 mg total) by mouth every 12 (twelve) hours. 08/16/15   Lacretia Leigh, MD   BP 137/63 mmHg  Pulse 54  Temp(Src) 97.4 F (36.3 C) (Oral)  Resp 14  SpO2 96% Physical Exam  Constitutional: She appears well-developed and well-nourished. No distress.  HENT:  Head: Normocephalic and atraumatic.  Eyes: Conjunctivae and EOM are normal.  Neck: Normal range of motion.  Cardiovascular: Normal  rate, regular rhythm, normal heart sounds and intact distal pulses.  Exam reveals no gallop and no friction rub.   No murmur heard. Pulmonary/Chest: Effort normal and breath sounds normal. No respiratory distress. She has no wheezes. She has no rales.  Abdominal: Soft. She exhibits no distension. There is no tenderness. There is no guarding.  Musculoskeletal: She exhibits no edema or tenderness.  Neurological: She is alert.  Skin: Skin is warm and dry. No rash noted. She is not diaphoretic. No erythema.  Nursing note and vitals reviewed.   ED Course  Procedures (including critical care time) Labs Review Labs Reviewed  CBC - Abnormal; Notable for the following:    Hemoglobin 15.8 (*)    All other components within normal limits  URINALYSIS, ROUTINE W REFLEX MICROSCOPIC (NOT AT [facility]) - Abnormal; Notable for the following:  Leukocytes, UA TRACE (*)    All other components within normal limits  URINE MICROSCOPIC-ADD ON - Abnormal; Notable for the following:    Squamous Epithelial / LPF 0-5 (*)    Bacteria, UA RARE (*)    All other components within normal limits  BASIC METABOLIC PANEL  I-STAT CG4 LACTIC ACID, ED  [facility], ED  I-STAT CG4 LACTIC ACID, ED  [facility] Idol, ED  [facility] Idol, ED    Imaging Review Dg Chest 2 View  09/16/2015  CLINICAL DATA:  Mid chest discomfort.  Vomiting for 2 days EXAM: CHEST  2 VIEW COMPARISON:  08/16/2015 FINDINGS: Normal heart size. No pleural effusion or edema identified. There is no airspace consolidation identified. Chronic interstitial coarsening is noted bilaterally. Mild spondylosis noted within the thoracic spine. IMPRESSION: 1. No acute cardiopulmonary abnormalities. Electronically Signed   By: Kerby Moors M.D.   On: 09/16/2015 13:26   I have personally reviewed and evaluated these images and lab results as part of my medical decision-making.   EKG Interpretation   Date/Time:  Tuesday September 16 2015 10:58:46  EDT Ventricular Rate:  54 PR Interval:  202 QRS Duration: 78 QT Interval:  429 QTC Calculation: 406 R Axis:   76 Text Interpretation:  Sinus rhythm Low voltage, precordial leads No  significant change since last tracing Confirmed by [facility] MD, Khya Halls  ([id number]) on 09/16/2015 11:45:39 AM      MDM   Final diagnoses:  Generalized weakness  Shortness of breath  Chest pain, unspecified chest pain type  Bradycardia   82 year old female with history of coronary artery diseases, diastolic CHF, hyperlipidemia, depression, possible TIA, dementia presents with concern for episode of shortness of breath, chest pain, generalized weakness, and near syncope. EKG evaluated by me and shows sinus rhythm with no sign of prolonged QTc, no brugada, no sign of HOCM, no ST abnormalities. EKG similar to prior. Chest x-ray was done which showed no sign pneumonia. Urinalysis shows no signs of UTI. Patient afebrile, without leukocytosis, normal lactic acid, low suspicion for infectious etiology of generalized weakness. Reports mild headache earlier today which has resolved spontaneously and doubt acute pathology. Has has CT for previous similar HA, and has not had any head trauma.  Electrolytes are within normal limits. 2 troponins were both negative and patient is chest pain free in the ED. Her O2 saturations are normal, no dyspnea or tachypnea in the ED and doubt PE.   Patient without sign of arrhythmia on telemetry, however discussed with daughter that cannot completely rule out cardiac arrhythmia as cause of prior syncopal episodes as well as episode of dyspnea today, however daughter and I agree that admission (or if discovery or arrhythmias found, pacemaker/defibrillator is not in line with patient's wishes.) It has also been discussed that cardiac intervention is not in line with patient's wishes and do not feel admission for this brief episode of chest pain without signs of cardiac ischemia and without desire  to pursue further stress testing or cardiac cath.   Patient does have new bradycardia in comparison to prior, with HR in low 50s and in 40s at times.  She has sinus bradycardia, no sign of heart block.  Discussed discontinuing her metoprolol as this may contribute to her generalized weakness.    Patient well appearing, hemodynamically stable, at baseline, pain free in ED.  Discharged in stable condition with understanding of reasons to return.   Gareth Morgan, MD 09/16/15 1750

## 2015-09-16 NOTE — ED Notes (Signed)
Pt from home via [facility] with c/o not being out of bed yet this morning like she normally is by this time of day.  Pt was initially SOB and holding her chest saying it was hard to breathe.  Lungs clear but pt was tachypnic initially.  Hx of dementia, anxiety, and recurrent UTI.  NAD, A&O at baseline.

## 2015-09-18 DIAGNOSIS — F0280 Dementia in other diseases classified elsewhere without behavioral disturbance: Secondary | ICD-10-CM | POA: Diagnosis not present

## 2015-09-18 DIAGNOSIS — G309 Alzheimer's disease, unspecified: Secondary | ICD-10-CM | POA: Diagnosis not present

## 2015-09-18 DIAGNOSIS — M542 Cervicalgia: Secondary | ICD-10-CM | POA: Diagnosis not present

## 2015-09-18 DIAGNOSIS — R001 Bradycardia, unspecified: Secondary | ICD-10-CM | POA: Diagnosis not present

## 2015-10-20 DIAGNOSIS — J018 Other acute sinusitis: Secondary | ICD-10-CM | POA: Diagnosis not present

## 2015-11-03 DIAGNOSIS — F0280 Dementia in other diseases classified elsewhere without behavioral disturbance: Secondary | ICD-10-CM | POA: Diagnosis not present

## 2015-11-03 DIAGNOSIS — G309 Alzheimer's disease, unspecified: Secondary | ICD-10-CM | POA: Diagnosis not present

## 2015-11-03 DIAGNOSIS — F418 Other specified anxiety disorders: Secondary | ICD-10-CM | POA: Diagnosis not present

## 2015-11-03 DIAGNOSIS — E7800 Pure hypercholesterolemia, unspecified: Secondary | ICD-10-CM | POA: Diagnosis not present

## 2015-11-03 DIAGNOSIS — I10 Essential (primary) hypertension: Secondary | ICD-10-CM | POA: Diagnosis not present

## 2015-11-09 DIAGNOSIS — N390 Urinary tract infection, site not specified: Secondary | ICD-10-CM | POA: Diagnosis not present

## 2015-11-25 DIAGNOSIS — F331 Major depressive disorder, recurrent, moderate: Secondary | ICD-10-CM | POA: Diagnosis not present

## 2015-12-20 DIAGNOSIS — M25561 Pain in right knee: Secondary | ICD-10-CM | POA: Diagnosis not present

## 2015-12-22 ENCOUNTER — Emergency Department (HOSPITAL_COMMUNITY)
Admission: EM | Admit: 2015-12-22 | Discharge: 2015-12-22 | Disposition: A | Discharge status: Home / Self Care | Payer: Medicare Other | Attending: Emergency Medicine | Admitting: Emergency Medicine

## 2015-12-22 ENCOUNTER — Emergency Department (HOSPITAL_COMMUNITY): Payer: Medicare Other

## 2015-12-22 ENCOUNTER — Encounter (HOSPITAL_COMMUNITY): Payer: Self-pay | Admitting: Emergency Medicine

## 2015-12-22 DIAGNOSIS — M25561 Pain in right knee: Secondary | ICD-10-CM | POA: Diagnosis not present

## 2015-12-22 DIAGNOSIS — Z7982 Long term (current) use of aspirin: Secondary | ICD-10-CM | POA: Insufficient documentation

## 2015-12-22 DIAGNOSIS — F329 Major depressive disorder, single episode, unspecified: Secondary | ICD-10-CM | POA: Diagnosis not present

## 2015-12-22 DIAGNOSIS — G309 Alzheimer's disease, unspecified: Secondary | ICD-10-CM | POA: Insufficient documentation

## 2015-12-22 DIAGNOSIS — I2510 Atherosclerotic heart disease of native coronary artery without angina pectoris: Secondary | ICD-10-CM | POA: Insufficient documentation

## 2015-12-22 DIAGNOSIS — E785 Hyperlipidemia, unspecified: Secondary | ICD-10-CM | POA: Diagnosis not present

## 2015-12-22 DIAGNOSIS — Z8673 Personal history of transient ischemic attack (TIA), and cerebral infarction without residual deficits: Secondary | ICD-10-CM | POA: Insufficient documentation

## 2015-12-22 LAB — COMPREHENSIVE METABOLIC PANEL
ALBUMIN: 3.7 g/dL (ref 3.5–5.0)
ALK PHOS: 52 U/L (ref 38–126)
ALT: 18 U/L (ref 14–54)
AST: 17 U/L (ref 15–41)
Anion gap: 6 (ref 5–15)
BILIRUBIN TOTAL: 0.8 mg/dL (ref 0.3–1.2)
BUN: 12 mg/dL (ref 6–20)
CALCIUM: 8.9 mg/dL (ref 8.9–10.3)
CO2: 25 mmol/L (ref 22–32)
CREATININE: 0.63 mg/dL (ref 0.44–1.00)
Chloride: 109 mmol/L (ref 101–111)
GFR calc Af Amer: >60 mL/min (ref >60)
GFR calc non Af Amer: >60 mL/min (ref >60)
GLUCOSE: 96 mg/dL (ref 65–99)
Potassium: 4.2 mmol/L (ref 3.5–5.1)
SODIUM: 140 mmol/L (ref 135–145)
Total Protein: 6.1 g/dL — ABNORMAL LOW (ref 6.5–8.1)

## 2015-12-22 LAB — CBC WITH DIFFERENTIAL/PLATELET
Basophils Absolute: 0 10*3/uL (ref 0.0–0.1)
Basophils Relative: 0 %
EOS ABS: 0 10*3/uL (ref 0.0–0.7)
Eosinophils Relative: 0 %
HEMATOCRIT: 43 % (ref 36.0–46.0)
HEMOGLOBIN: 14.7 g/dL (ref 12.0–15.0)
LYMPHS ABS: 1.5 10*3/uL (ref 0.7–4.0)
LYMPHS PCT: 31 %
MCH: 30.6 pg (ref 26.0–34.0)
MCHC: 34.2 g/dL (ref 30.0–36.0)
MCV: 89.6 fL (ref 78.0–100.0)
MONOS PCT: 9 %
Monocytes Absolute: 0.4 10*3/uL (ref 0.1–1.0)
NEUTROS ABS: 2.9 10*3/uL (ref 1.7–7.7)
NEUTROS PCT: 60 %
Platelets: 196 10*3/uL (ref 150–400)
RBC: 4.8 MIL/uL (ref 3.87–5.11)
RDW: 12.8 % (ref 11.5–15.5)
WBC: 4.8 10*3/uL (ref 4.0–10.5)

## 2015-12-22 LAB — URINALYSIS, ROUTINE W REFLEX MICROSCOPIC
Bilirubin Urine: NEGATIVE
Glucose, UA: NEGATIVE mg/dL
HGB URINE DIPSTICK: NEGATIVE
Ketones, ur: NEGATIVE mg/dL
Leukocytes, UA: NEGATIVE
Nitrite: NEGATIVE
PH: 6.5 (ref 5.0–8.0)
Protein, ur: NEGATIVE mg/dL
SPECIFIC GRAVITY, URINE: 1.006 (ref 1.005–1.030)

## 2015-12-22 MED ORDER — ACETAMINOPHEN-CODEINE #3 300-30 MG PO TABS: 1.0000 | ORAL_TABLET | Freq: Four times a day (QID) | ORAL | Status: DC | PRN

## 2015-12-22 NOTE — ED Notes (Signed)
MD at bedside. 

## 2015-12-22 NOTE — ED Provider Notes (Addendum)
CSN: [id number]     Arrival date & time 12/22/15  1013 History   First MD Initiated Contact with Patient 12/22/15 1051     Chief Complaint  Patient presents with  . Knee Pain     (Consider location/radiation/quality/duration/timing/severity/associated sxs/prior Treatment) HPI Comments: Patient here complaining of right knee pain times over a week. No history of trauma. See her physician for similar symptoms 3 days ago and according to the family had an x-ray of her right knee that showed bone spurs. Was given an IM injection of steroids and told to take Tylenol as needed. Family denies any history of injury. Pain on her knee is localized to the right knee at the region of the [facility]. Denies any fever or chills. No redness or swelling to the knee. She is unable to ambulate due to the pain. Denies any hip or back discomfort  The history is provided by the patient and a caregiver.    Past Medical History  Diagnosis Date  . Chest pain     Hospital February, 2012, nuclear, possible anteroseptal and inferoseptal ischemia, technically difficult, medical therapy  . Dyslipidemia   . Orthostatic hypotension   . Migraines     25 year  . Dementia     Significant  . Ejection fraction     EF 55%, echo, February, 2012  //   EF 55-60%, echo, February 25, 2012  . Diastolic dysfunction     Echo, February, 2012  . TIA (transient ischemic attack)        ???question of a TIA in February, 2012, carotid Doppler, August 11, 2010 no significant abnormalities  . GI bleed     February, 2012 incomplete colonoscopy, hemorrhoids, needs complete colonoscopy  . CAD (coronary artery disease)     Probable CAD, nuclear February, 2012, possible anteroseptal and inferoseptal ischemia but the study was technically difficult  . Hyperlipidemia   . Edema     hospitalizations September, 2013, some fluid overload  . DNR (do not resuscitate)   . Major depression ([place])     2 overdoses in 1975 & 1977  . Arthritis   .  Alzheimer disease    Past Surgical History  Procedure Laterality Date  . Abdominal hysterectomy    . Diagnostic mammogram  12/29/2000    Mammogram, suspicious lesions -BX pending  . Tonsillectomy     Family History  Problem Relation Age of Onset  . Heart attack Father     several heart attacks  . Hypertension Daughter   . Cancer Father   . Suicidality Mother   . Heart attack Brother   . Heart disease Brother   . Pneumonia Brother   . Suicidality Son   . Arthritis Father   . Depression Other     Most family members   Social History  Substance Use Topics  . Smoking status: Never Smoker   . Smokeless tobacco: Never Used  . Alcohol Use: No   OB History    No data available     Review of Systems  All other systems reviewed and are negative.     Allergies  Levaquin  Home Medications   Prior to Admission medications   Medication Sig Start Date End Date Taking? Authorizing Provider  acetaminophen (TYLENOL) 325 MG tablet Take 2 tablets (650 mg total) by mouth every 6 (six) hours as needed for mild pain (or Fever >/= 101). 11/04/14   Ashly Windell Moulding, DO  amLODipine (NORVASC) 5 MG tablet Take 2.5  mg by mouth every morning.     Historical Provider, MD  aspirin EC 81 MG tablet Take 81 mg by mouth every morning.    Historical Provider, MD  CRANBERRY EXTRACT PO Take 4,600 mg by mouth every morning.     Historical Provider, MD  cyanocobalamin 500 MCG tablet Take 500 mcg by mouth every morning.     Historical Provider, MD  docusate sodium (COLACE) 100 MG capsule Take 2 capsules (200 mg total) by mouth 2 (two) times daily as needed for mild constipation. Patient taking differently: Take 200 mg by mouth at bedtime.  02/05/15   Florencia Reasons, MD  furosemide (LASIX) 40 MG tablet Take 40 mg by mouth every morning. 04/05/12   Scott Joylene Draft, PA-C  guaiFENesin-dextromethorphan (ROBITUSSIN DM) 100-10 MG/5ML syrup Take 5 mLs by mouth every 4 (four) hours as needed for cough. 03/01/15   Carmin Muskrat, MD  isosorbide mononitrate (IMDUR) 30 MG 24 hr tablet TAKE 1/2 TABLET BY MOUTH DAILY CUT FOR PATIENT Patient taking differently: Take 0.5 tablet by mouth daily. 04/27/13   Carlena Bjornstad, MD  mirtazapine (REMERON) 7.5 MG tablet Take 7.5 mg by mouth daily. At 4 pm.    Historical Provider, MD  neomycin-bacitracin-polymyxin (NEOSPORIN) ointment Apply 1 application topically daily. On face.    Historical Provider, MD  oseltamivir (TAMIFLU) 75 MG capsule Take 1 capsule (75 mg total) by mouth every 12 (twelve) hours. 08/16/15   Lacretia Leigh, MD  potassium chloride SA (K-DUR,KLOR-CON) 20 MEQ tablet Take 40 mEq by mouth every morning.  11/20/14   Gildardo Cranker, DO  pravastatin (PRAVACHOL) 20 MG tablet Take 10 mg by mouth every evening.     Historical Provider, MD  QUEtiapine (SEROQUEL) 25 MG tablet Take 25 mg by mouth every morning. @900  in the morning.    Historical Provider, MD   BP 151/77 mmHg  Pulse 64  Temp(Src) 98.4 F (36.9 C) (Oral)  Resp 16  SpO2 96% Physical Exam  Constitutional: She is oriented to person, place, and time. She appears well-developed and well-nourished.  Non-toxic appearance. No distress.  HENT:  Head: Normocephalic and atraumatic.  Eyes: Conjunctivae, EOM and lids are normal. Pupils are equal, round, and reactive to light.  Neck: Normal range of motion. Neck supple. No tracheal deviation present. No thyroid mass present.  Cardiovascular: Normal rate, regular rhythm and normal heart sounds.  Exam reveals no gallop.   No murmur heard. Pulmonary/Chest: Effort normal and breath sounds normal. No stridor. No respiratory distress. She has no decreased breath sounds. She has no wheezes. She has no rhonchi. She has no rales.  Abdominal: Soft. Normal appearance and bowel sounds are normal. She exhibits no distension. There is no tenderness. There is no rebound and no CVA tenderness.  Musculoskeletal: Normal range of motion. She exhibits no edema or tenderness.        Legs: Knee with full range of motion. No evidence of effusion. No erythema to the skin. No pain with range of motion to the right hip. No pain or swelling to the right ankle or foot. Pain to palpation at the [facility]  Neurological: She is alert and oriented to person, place, and time. She has normal strength. No cranial nerve deficit or sensory deficit. GCS eye subscore is 4. GCS verbal subscore is 5. GCS motor subscore is 6.  Skin: Skin is warm and dry. No abrasion and no rash noted.  Psychiatric: Her affect is blunt. She is withdrawn.  Nursing note and  vitals reviewed.   ED Course  Procedures (including critical care time) Labs Review Labs Reviewed - No data to display  Imaging Review No results found. I have personally reviewed and evaluated these images and lab results as part of my medical decision-making.   EKG Interpretation None      MDM   Final diagnoses:  None    Patient's labs and urinalysis as well as x-ray without acute findings. Patient has no calf or thigh tenderness. Her pain is pinpoint at the right MCL. Do not think that this represents a DVT. No evidence of septic joint. Patient has a follow-up plan with orthopedist in 3 days. Will prescribe low-dose pain medication    Lacretia Leigh, MD 12/22/15 1527  Lacretia Leigh, MD 12/22/15 [phone]

## 2015-12-22 NOTE — Discharge Instructions (Signed)

## 2015-12-22 NOTE — ED Notes (Signed)
Hx of dementia. Family reports pt has been having R knee pain for the past several days. [facility] for this. Was given dexamethasone injection, which helped initially, but pain has returned. Family also reports knees seem to be more swollen than normal today (R worse than L). Is on lasix.

## 2015-12-23 LAB — URINE CULTURE: CULTURE: NO GROWTH

## 2015-12-25 ENCOUNTER — Emergency Department (HOSPITAL_COMMUNITY): Payer: Medicare Other

## 2015-12-25 ENCOUNTER — Emergency Department (EMERGENCY_DEPARTMENT_HOSPITAL)
Admit: 2015-12-25 | Discharge: 2015-12-25 | Disposition: A | Discharge status: Home / Self Care | Payer: Medicare Other | Attending: Emergency Medicine | Admitting: Emergency Medicine

## 2015-12-25 ENCOUNTER — Encounter (HOSPITAL_COMMUNITY): Payer: Self-pay | Admitting: Emergency Medicine

## 2015-12-25 ENCOUNTER — Emergency Department (HOSPITAL_COMMUNITY)
Admission: EM | Admit: 2015-12-25 | Discharge: 2015-12-25 | Disposition: A | Discharge status: Home / Self Care | Payer: Medicare Other | Attending: Emergency Medicine | Admitting: Emergency Medicine

## 2015-12-25 DIAGNOSIS — E785 Hyperlipidemia, unspecified: Secondary | ICD-10-CM | POA: Diagnosis not present

## 2015-12-25 DIAGNOSIS — Z7982 Long term (current) use of aspirin: Secondary | ICD-10-CM | POA: Diagnosis not present

## 2015-12-25 DIAGNOSIS — G309 Alzheimer's disease, unspecified: Secondary | ICD-10-CM | POA: Insufficient documentation

## 2015-12-25 DIAGNOSIS — Z79899 Other long term (current) drug therapy: Secondary | ICD-10-CM | POA: Diagnosis not present

## 2015-12-25 DIAGNOSIS — I2510 Atherosclerotic heart disease of native coronary artery without angina pectoris: Secondary | ICD-10-CM | POA: Insufficient documentation

## 2015-12-25 DIAGNOSIS — R52 Pain, unspecified: Secondary | ICD-10-CM | POA: Diagnosis not present

## 2015-12-25 DIAGNOSIS — M25551 Pain in right hip: Secondary | ICD-10-CM | POA: Diagnosis not present

## 2015-12-25 DIAGNOSIS — M1990 Unspecified osteoarthritis, unspecified site: Secondary | ICD-10-CM | POA: Insufficient documentation

## 2015-12-25 DIAGNOSIS — Z8673 Personal history of transient ischemic attack (TIA), and cerebral infarction without residual deficits: Secondary | ICD-10-CM | POA: Diagnosis not present

## 2015-12-25 DIAGNOSIS — F329 Major depressive disorder, single episode, unspecified: Secondary | ICD-10-CM | POA: Diagnosis not present

## 2015-12-25 DIAGNOSIS — M25561 Pain in right knee: Secondary | ICD-10-CM | POA: Diagnosis not present

## 2015-12-25 DIAGNOSIS — M79606 Pain in leg, unspecified: Secondary | ICD-10-CM | POA: Diagnosis not present

## 2015-12-25 DIAGNOSIS — R404 Transient alteration of awareness: Secondary | ICD-10-CM | POA: Diagnosis not present

## 2015-12-25 DIAGNOSIS — R531 Weakness: Secondary | ICD-10-CM | POA: Diagnosis not present

## 2015-12-25 NOTE — ED Notes (Signed)
Patient transported to X-ray 

## 2015-12-25 NOTE — ED Notes (Signed)
Per EMS, pt from home c/o bilateral leg pain. Pt reports she is unable to bear weight on the right leg. Seen on 6/3 for same. Pts daughter reports swelling to right leg.

## 2015-12-25 NOTE — ED Provider Notes (Signed)
Emergency Department Provider Note  Time seen: Approximately 9:45 AM  I have reviewed the triage vital signs and the nursing notes.   HISTORY  Chief Complaint Leg Pain   HPI Valerie Chen is a 82 y.o. female with PMH of dementia, CAD, HTN, presents to emergency department for evaluation of continued right knee pain. The patient has baseline dementia which limits HPI. Family at bedside state that over the past week she has been complaining of severe right leg pain. No obvious trauma to the leg. Has difficulty quantifying or specifically describing her pain. She was given Motrin prior to ED presentation and states that she is not in pain at this time. Family states that they have an orthopedist appointment at 2:45 PM today but patient was crying in pain this morning and so they came to the emergency department. They report there particularly concerned about blood clots although the patient does not have a history of this. No fevers or shaking chills. They noted some increased swelling of the knee and leg. Over the last week the family has secured a wheelchair at home, ramp, bedside commode, sliding shower chair to assist with her mobility at home.   Past Medical History  Diagnosis Date  . Chest pain     Hospital February, 2012, nuclear, possible anteroseptal and inferoseptal ischemia, technically difficult, medical therapy  . Dyslipidemia   . Orthostatic hypotension   . Migraines     25 year  . Dementia     Significant  . Ejection fraction     EF 55%, echo, February, 2012  //   EF 55-60%, echo, February 25, 2012  . Diastolic dysfunction     Echo, February, 2012  . TIA (transient ischemic attack)        ???question of a TIA in February, 2012, carotid Doppler, August 11, 2010 no significant abnormalities  . GI bleed     February, 2012 incomplete colonoscopy, hemorrhoids, needs complete colonoscopy  . CAD (coronary artery disease)     Probable CAD, nuclear February, 2012, possible  anteroseptal and inferoseptal ischemia but the study was technically difficult  . Hyperlipidemia   . Edema     hospitalizations September, 2013, some fluid overload  . DNR (do not resuscitate)   . Major depression ([place])     2 overdoses in 1975 & 1977  . Arthritis   . Alzheimer disease     Patient Active Problem List   Diagnosis Date Noted  . HCAP (healthcare-associated pneumonia) 02/01/2015  . Hypoxia 02/01/2015  . Recurrent UTI 02/01/2015  . Neck pain   . Essential hypertension 05/30/2013  . Ejection fraction   . Edema   . Lower extremity edema 02/25/2012  . CAD (coronary artery disease)   . Chest pain   . Orthostatic hypotension   . Migraines   . Dementia   . Diastolic dysfunction   . TIA (transient ischemic attack)   . GI bleed   . Dyslipidemia     Past Surgical History  Procedure Laterality Date  . Abdominal hysterectomy    . Diagnostic mammogram  12/29/2000    Mammogram, suspicious lesions -BX pending  . Tonsillectomy      Current Outpatient Rx  Name  Route  Sig  Dispense  Refill  . acetaminophen (TYLENOL) 325 MG tablet   Oral   Take 2 tablets (650 mg total) by mouth every 6 (six) hours as needed for mild pain (or Fever >/= 101).   30 tablet   0   .  acetaminophen-codeine (TYLENOL #3) 300-30 MG tablet   Oral   Take 1-2 tablets by mouth every 6 (six) hours as needed for moderate pain.   15 tablet   0   . amLODipine (NORVASC) 5 MG tablet   Oral   Take 2.5 mg by mouth every morning.          Marland Kitchen aspirin EC 81 MG tablet   Oral   Take 81 mg by mouth every morning.         Marland Kitchen CRANBERRY EXTRACT PO   Oral   Take 1,533 mg by mouth 3 (three) times daily after meals.          . cyanocobalamin 500 MCG tablet   Oral   Take 500 mcg by mouth every morning.          . docusate sodium (COLACE) 100 MG capsule   Oral   Take 2 capsules (200 mg total) by mouth 2 (two) times daily as needed for mild constipation. Patient taking differently: Take 200 mg by  mouth at bedtime.    10 capsule   0   . furosemide (LASIX) 40 MG tablet   Oral   Take 40 mg by mouth every morning.         Marland Kitchen guaiFENesin-dextromethorphan (ROBITUSSIN DM) 100-10 MG/5ML syrup   Oral   Take 5 mLs by mouth every 4 (four) hours as needed for cough.   118 mL   0   . isosorbide mononitrate (IMDUR) 30 MG 24 hr tablet      TAKE 1/2 TABLET BY MOUTH DAILY CUT FOR PATIENT Patient not taking: Reported on 12/22/2015   15 tablet   1     Generic [id number] 30 MG TABLET SA  04/27/2013 8:32 ...   . LORazepam (ATIVAN) 0.5 MG tablet   Oral   Take 0.5 mg by mouth 2 (two) times daily as needed for anxiety.       1   . oseltamivir (TAMIFLU) 75 MG capsule   Oral   Take 1 capsule (75 mg total) by mouth every 12 (twelve) hours. Patient not taking: Reported on 12/22/2015   10 capsule   0   . potassium chloride SA (K-DUR,KLOR-CON) 20 MEQ tablet   Oral   Take 40 mEq by mouth every morning.    60 tablet   6   . pravastatin (PRAVACHOL) 20 MG tablet   Oral   Take 10 mg by mouth every evening.          Marland Kitchen QUEtiapine (SEROQUEL) 25 MG tablet   Oral   Take 25 mg by mouth daily. @900  in the morning.           Allergies Levaquin  Family History  Problem Relation Age of Onset  . Heart attack Father     several heart attacks  . Hypertension Daughter   . Cancer Father   . Suicidality Mother   . Heart attack Brother   . Heart disease Brother   . Pneumonia Brother   . Suicidality Son   . Arthritis Father   . Depression Other     Most family members    Social History Social History  Substance Use Topics  . Smoking status: Never Smoker   . Smokeless tobacco: Never Used  . Alcohol Use: No    Review of Systems  Limited by dementia   Constitutional: No fever/chills Eyes: No visual changes. ENT: No sore throat. Cardiovascular: Denies chest pain. Respiratory: Denies shortness of breath.  Gastrointestinal: No abdominal pain.  No nausea, no vomiting.  No diarrhea.   No constipation. Genitourinary: Negative for dysuria. Musculoskeletal: Negative for back pain. Positive right knee pain.  Skin: Negative for rash. Neurological: Negative for headaches, focal weakness or numbness.  10-point ROS otherwise negative.  ____________________________________________   PHYSICAL EXAM:  VITAL SIGNS: ED Triage Vitals  Enc Vitals Group     BP 12/25/15 0911 140/67 mmHg     Pulse Rate 12/25/15 0911 59     Resp 12/25/15 0911 16     Temp 12/25/15 0911 98.1 F (36.7 C)     Temp Source 12/25/15 0911 Oral     SpO2 12/25/15 0908 97 %     Pain Score 12/25/15 0910 0   Constitutional: Alert and oriented. Well appearing and in no acute distress. Eyes: Conjunctivae are normal.  Head: Atraumatic. Mouth/Throat: Mucous membranes are moist.  Oropharynx non-erythematous. Neck: No stridor.  Cardiovascular: Normal rate, regular rhythm. Good peripheral circulation. Grossly normal heart sounds.   Respiratory: Normal respiratory effort.  No retractions. Lungs CTAB. Gastrointestinal: Soft and nontender. No distention.  Musculoskeletal: No appreciable tenderness to palpation of the right knee. No effusion appreciated on bedside US. Full ROM of the knee. Only mild limited ROM of the right hip. No overlying erythema or other lesions.  Neurologic:  Normal speech and language. No gross focal neurologic deficits are appreciated.  Skin:  Skin is warm, dry and intact. No rash noted. Psychiatric: Mood and affect are normal. Speech and behavior are normal.  RADIOLOGY}  Dg Hip Unilat With Pelvis 1v Right  12/25/2015  CLINICAL DATA:  Bilateral leg pain. Unable to bear weight on the right. EXAM: DG HIP (WITH OR WITHOUT PELVIS) 1V RIGHT COMPARISON:  None. FINDINGS: No acute fracture or dislocation. Mild degenerative axial hip narrowing on the right. Mild labral chondrocalcinosis. Symphysis pubis degeneration.  Lower lumbar endplate spurring. Osteopenia. Undigested pills in the right lower  quadrant. IMPRESSION: No acute osseous finding. Electronically Signed   By: Monte Fantasia M.D.   On: 12/25/2015 10:07   DVT study: Verbally reported as negative for right leg DVT.  ____________________________________________   PROCEDURES  Procedure(s) performed:   Procedures  ULTRASOUND LIMITED SOFT TISSUE/ MUSCULOSKELETAL:  Indication: Right knee pain; r/o effusion Linear probe used to evaluate area of interest in two planes. Findings:  No knee joint effusion detected. No surrounding fluid seen.  Performed by: Dr Laverta Baltimore Images saved electronically ____________________________________________   INITIAL IMPRESSION / [place] / ED COURSE  Pertinent labs & imaging results that were available during my care of the patient were reviewed by me and considered in my medical decision making (see chart for details).  Patient presents emergency department for reevaluation of right knee pain. Patient was previously ambulatory but now has significant difficulty with this. Very low suspicion for septic arthritis of the knee. Bedside ultrasound performed to evaluate for knee joint effusion was negative. Full range of motion of the knee. Somewhat limited range of motion of the right hip. We'll obtain x-ray of the right hip to rule out referred pain from hip injury. We will also evaluate for DVT. Patient has become increasingly bedbound and is complaining of diffuse pain over the leg. Body habitus makes it difficult to assess the size of the leg in comparison to the other but the right leg may be slightly more swollen. Labs and urinalysis completed on prior ED visit. Discussed discontinuing Motrin use with the family at bedside and advised using Tylenol  given the patient's age.   10:34 AM X-ray and LE [place] negative for acute pathology. Plan for discharge. Low suspicion for occult hip fracture with near full ROM of the right hip on exam. Patient is seeing the orthopedist this afternoon. Has  support and devices at home to deal with immobility. Discussed using Tylenol at home for pain as opposed to Motrin.    ____________________________________________  FINAL CLINICAL IMPRESSION(S) / ED DIAGNOSES  Final diagnoses:  Right knee pain     MEDICATIONS GIVEN DURING THIS VISIT:  None  NEW OUTPATIENT MEDICATIONS STARTED DURING THIS VISIT:  None   Note:  This document was prepared using Dragon voice recognition software and may include unintentional dictation errors.  Nanda Quinton, MD Emergency Medicine  Margette Fast, MD 12/25/15 [phone]

## 2015-12-25 NOTE — ED Notes (Signed)
Bed: [id number] Expected date:  Expected time:  Means of arrival:  Comments: EMS-82 yo leg pain

## 2015-12-25 NOTE — Discharge Instructions (Signed)
We believe that your symptoms are caused by musculoskeletal problems.  Please read through the included information about additional care such as heating pads, over-the-counter pain medicine; preferably Tylenol. Follow up with the orthopedist today as scheduled. Remember that early mobility and using the affected part of your body is actually better than keeping it immobile.  Follow-up with the doctor listed as recommended or return to the emergency department with new or worsening symptoms that concern you.

## 2015-12-25 NOTE — Progress Notes (Signed)
*  Preliminary Results* Right lower extremity venous duplex completed. Right lower extremity is negative for deep vein thrombosis. There is no evidence of right Baker's cyst.  12/25/2015 10:31 AM  Maudry Mayhew, RVT, RDCS, RDMS

## 2015-12-26 ENCOUNTER — Other Ambulatory Visit: Payer: Self-pay | Admitting: Orthopedic Surgery

## 2015-12-26 DIAGNOSIS — M25551 Pain in right hip: Secondary | ICD-10-CM

## 2015-12-28 ENCOUNTER — Ambulatory Visit
Admission: RE | Admit: 2015-12-28 | Discharge: 2015-12-28 | Disposition: A | Discharge status: Home / Self Care | Payer: Medicare Other | Source: Ambulatory Visit | Attending: Orthopedic Surgery | Admitting: Orthopedic Surgery

## 2015-12-28 DIAGNOSIS — M1611 Unilateral primary osteoarthritis, right hip: Secondary | ICD-10-CM | POA: Diagnosis not present

## 2015-12-28 DIAGNOSIS — M25551 Pain in right hip: Secondary | ICD-10-CM

## 2015-12-29 ENCOUNTER — Observation Stay (HOSPITAL_COMMUNITY)
Admission: EM | Admit: 2015-12-29 | Discharge: 2015-12-30 | Disposition: A | Discharge status: Home / Self Care | Payer: Medicare Other | Attending: Internal Medicine | Admitting: Internal Medicine

## 2015-12-29 ENCOUNTER — Emergency Department (HOSPITAL_COMMUNITY): Payer: Medicare Other

## 2015-12-29 ENCOUNTER — Encounter (HOSPITAL_COMMUNITY): Payer: Self-pay

## 2015-12-29 DIAGNOSIS — F0280 Dementia in other diseases classified elsewhere without behavioral disturbance: Secondary | ICD-10-CM | POA: Insufficient documentation

## 2015-12-29 DIAGNOSIS — R32 Unspecified urinary incontinence: Secondary | ICD-10-CM | POA: Diagnosis not present

## 2015-12-29 DIAGNOSIS — Z888 Allergy status to other drugs, medicaments and biological substances status: Secondary | ICD-10-CM | POA: Diagnosis not present

## 2015-12-29 DIAGNOSIS — F0391 Unspecified dementia with behavioral disturbance: Secondary | ICD-10-CM | POA: Diagnosis not present

## 2015-12-29 DIAGNOSIS — I2510 Atherosclerotic heart disease of native coronary artery without angina pectoris: Secondary | ICD-10-CM | POA: Insufficient documentation

## 2015-12-29 DIAGNOSIS — G43909 Migraine, unspecified, not intractable, without status migrainosus: Secondary | ICD-10-CM | POA: Diagnosis not present

## 2015-12-29 DIAGNOSIS — F068 Other specified mental disorders due to known physiological condition: Secondary | ICD-10-CM | POA: Diagnosis present

## 2015-12-29 DIAGNOSIS — Z8673 Personal history of transient ischemic attack (TIA), and cerebral infarction without residual deficits: Secondary | ICD-10-CM | POA: Diagnosis not present

## 2015-12-29 DIAGNOSIS — Z8249 Family history of ischemic heart disease and other diseases of the circulatory system: Secondary | ICD-10-CM | POA: Diagnosis not present

## 2015-12-29 DIAGNOSIS — R55 Syncope and collapse: Secondary | ICD-10-CM | POA: Diagnosis not present

## 2015-12-29 DIAGNOSIS — M1990 Unspecified osteoarthritis, unspecified site: Secondary | ICD-10-CM | POA: Diagnosis not present

## 2015-12-29 DIAGNOSIS — Z90710 Acquired absence of both cervix and uterus: Secondary | ICD-10-CM | POA: Diagnosis not present

## 2015-12-29 DIAGNOSIS — Z7982 Long term (current) use of aspirin: Secondary | ICD-10-CM | POA: Insufficient documentation

## 2015-12-29 DIAGNOSIS — Z8261 Family history of arthritis: Secondary | ICD-10-CM | POA: Insufficient documentation

## 2015-12-29 DIAGNOSIS — E785 Hyperlipidemia, unspecified: Secondary | ICD-10-CM | POA: Insufficient documentation

## 2015-12-29 DIAGNOSIS — N390 Urinary tract infection, site not specified: Secondary | ICD-10-CM | POA: Diagnosis not present

## 2015-12-29 DIAGNOSIS — M609 Myositis, unspecified: Secondary | ICD-10-CM | POA: Diagnosis not present

## 2015-12-29 DIAGNOSIS — I5189 Other ill-defined heart diseases: Secondary | ICD-10-CM | POA: Diagnosis present

## 2015-12-29 DIAGNOSIS — R001 Bradycardia, unspecified: Secondary | ICD-10-CM | POA: Diagnosis not present

## 2015-12-29 DIAGNOSIS — Z79899 Other long term (current) drug therapy: Secondary | ICD-10-CM | POA: Insufficient documentation

## 2015-12-29 DIAGNOSIS — E860 Dehydration: Secondary | ICD-10-CM | POA: Insufficient documentation

## 2015-12-29 DIAGNOSIS — F329 Major depressive disorder, single episode, unspecified: Secondary | ICD-10-CM | POA: Diagnosis not present

## 2015-12-29 DIAGNOSIS — I519 Heart disease, unspecified: Secondary | ICD-10-CM | POA: Diagnosis not present

## 2015-12-29 DIAGNOSIS — I080 Rheumatic disorders of both mitral and aortic valves: Secondary | ICD-10-CM | POA: Insufficient documentation

## 2015-12-29 DIAGNOSIS — I10 Essential (primary) hypertension: Secondary | ICD-10-CM | POA: Diagnosis present

## 2015-12-29 DIAGNOSIS — Z818 Family history of other mental and behavioral disorders: Secondary | ICD-10-CM | POA: Diagnosis not present

## 2015-12-29 DIAGNOSIS — I440 Atrioventricular block, first degree: Secondary | ICD-10-CM | POA: Insufficient documentation

## 2015-12-29 DIAGNOSIS — R404 Transient alteration of awareness: Secondary | ICD-10-CM | POA: Diagnosis not present

## 2015-12-29 DIAGNOSIS — G309 Alzheimer's disease, unspecified: Secondary | ICD-10-CM | POA: Diagnosis not present

## 2015-12-29 DIAGNOSIS — R42 Dizziness and giddiness: Secondary | ICD-10-CM | POA: Diagnosis not present

## 2015-12-29 HISTORY — DX: Essential (primary) hypertension: I10

## 2015-12-29 HISTORY — DX: Personal history of urinary (tract) infections: Z87.440

## 2015-12-29 LAB — CBC
HCT: 45.9 % (ref 36.0–46.0)
HEMOGLOBIN: 15.3 g/dL — AB (ref 12.0–15.0)
MCH: 30.5 pg (ref 26.0–34.0)
MCHC: 33.3 g/dL (ref 30.0–36.0)
MCV: 91.4 fL (ref 78.0–100.0)
PLATELETS: 191 10*3/uL (ref 150–400)
RBC: 5.02 MIL/uL (ref 3.87–5.11)
RDW: 12.8 % (ref 11.5–15.5)
WBC: 5.2 10*3/uL (ref 4.0–10.5)

## 2015-12-29 LAB — BASIC METABOLIC PANEL
Anion gap: 7 (ref 5–15)
BUN: 12 mg/dL (ref 6–20)
CHLORIDE: 107 mmol/L (ref 101–111)
CO2: 26 mmol/L (ref 22–32)
CREATININE: 0.82 mg/dL (ref 0.44–1.00)
Calcium: 9.2 mg/dL (ref 8.9–10.3)
GFR calc non Af Amer: >60 mL/min (ref >60)
Glucose, Bld: 98 mg/dL (ref 65–99)
Potassium: 3.6 mmol/L (ref 3.5–5.1)
Sodium: 140 mmol/L (ref 135–145)

## 2015-12-29 LAB — URINALYSIS, ROUTINE W REFLEX MICROSCOPIC
Bilirubin Urine: NEGATIVE
Glucose, UA: NEGATIVE mg/dL
Hgb urine dipstick: NEGATIVE
KETONES UR: NEGATIVE mg/dL
LEUKOCYTES UA: NEGATIVE
NITRITE: NEGATIVE
PROTEIN: NEGATIVE mg/dL
Specific Gravity, Urine: 1.013 (ref 1.005–1.030)
pH: 6.5 (ref 5.0–8.0)

## 2015-12-29 LAB — TSH: TSH: 2.326 u[IU]/mL (ref 0.350–4.500)

## 2015-12-29 LAB — I-STAT TROPONIN, ED: TROPONIN I, POC: 0 ng/mL (ref 0.00–0.08)

## 2015-12-29 LAB — CBG MONITORING, ED: GLUCOSE-CAPILLARY: 91 mg/dL (ref 65–99)

## 2015-12-29 MED ORDER — SODIUM CHLORIDE 0.9% FLUSH
3.0000 mL | Freq: Two times a day (BID) | INTRAVENOUS | Status: DC
  Administered 2015-12-29 – 2015-12-30 (×2): 3 mL via INTRAVENOUS

## 2015-12-29 MED ORDER — ACETAMINOPHEN 325 MG PO TABS
650.0000 mg | ORAL_TABLET | Freq: Four times a day (QID) | ORAL | Status: DC | PRN
  Administered 2015-12-29 – 2015-12-30 (×2): 650 mg via ORAL
  Filled 2015-12-29 (×2): qty 2

## 2015-12-29 MED ORDER — LORAZEPAM 0.5 MG PO TABS: 0.5000 mg | ORAL_TABLET | Freq: Two times a day (BID) | ORAL | Status: DC | PRN

## 2015-12-29 MED ORDER — HYDROCODONE-ACETAMINOPHEN 5-325 MG PO TABS: 1.0000 | ORAL_TABLET | ORAL | Status: DC | PRN

## 2015-12-29 MED ORDER — ASPIRIN EC 81 MG PO TBEC
81.0000 mg | DELAYED_RELEASE_TABLET | Freq: Every morning | ORAL | Status: DC
  Administered 2015-12-30: 81 mg via ORAL
  Filled 2015-12-29: qty 1

## 2015-12-29 MED ORDER — POTASSIUM CHLORIDE CRYS ER 20 MEQ PO TBCR
40.0000 meq | EXTENDED_RELEASE_TABLET | Freq: Every morning | ORAL | Status: DC
  Administered 2015-12-30: 40 meq via ORAL
  Filled 2015-12-29: qty 2

## 2015-12-29 MED ORDER — TRAZODONE HCL 50 MG PO TABS: 50.0000 mg | ORAL_TABLET | Freq: Every evening | ORAL | Status: DC | PRN

## 2015-12-29 MED ORDER — POLYETHYLENE GLYCOL 3350 17 G PO PACK: 17.0000 g | PACK | Freq: Every day | ORAL | Status: DC | PRN

## 2015-12-29 MED ORDER — ONDANSETRON HCL 4 MG/2ML IJ SOLN: 4.0000 mg | Freq: Four times a day (QID) | INTRAMUSCULAR | Status: DC | PRN

## 2015-12-29 MED ORDER — ENOXAPARIN SODIUM 40 MG/0.4ML ~~LOC~~ SOLN
40.0000 mg | SUBCUTANEOUS | Status: DC
  Administered 2015-12-29: 40 mg via SUBCUTANEOUS
  Filled 2015-12-29: qty 0.4

## 2015-12-29 MED ORDER — DICLOFENAC SODIUM 1 % TD GEL
4.0000 g | Freq: Four times a day (QID) | TRANSDERMAL | Status: DC | PRN
  Administered 2015-12-29: 4 g via TOPICAL
  Filled 2015-12-29: qty 100

## 2015-12-29 MED ORDER — ONDANSETRON HCL 4 MG PO TABS: 4.0000 mg | ORAL_TABLET | Freq: Four times a day (QID) | ORAL | Status: DC | PRN

## 2015-12-29 MED ORDER — PRAVASTATIN SODIUM 20 MG PO TABS
10.0000 mg | ORAL_TABLET | Freq: Every evening | ORAL | Status: DC
  Administered 2015-12-29: 10 mg via ORAL
  Filled 2015-12-29: qty 1

## 2015-12-29 MED ORDER — DOCUSATE SODIUM 100 MG PO CAPS
200.0000 mg | ORAL_CAPSULE | Freq: Every day | ORAL | Status: DC
  Administered 2015-12-30: 200 mg via ORAL
  Filled 2015-12-29 (×2): qty 2

## 2015-12-29 MED ORDER — QUETIAPINE FUMARATE 25 MG PO TABS
25.0000 mg | ORAL_TABLET | Freq: Every day | ORAL | Status: DC
  Administered 2015-12-30: 25 mg via ORAL
  Filled 2015-12-29: qty 1

## 2015-12-29 MED ORDER — FUROSEMIDE 40 MG PO TABS
40.0000 mg | ORAL_TABLET | Freq: Every morning | ORAL | Status: DC
  Administered 2015-12-30: 40 mg via ORAL
  Filled 2015-12-29: qty 1

## 2015-12-29 MED ORDER — ACETAMINOPHEN 650 MG RE SUPP: 650.0000 mg | Freq: Four times a day (QID) | RECTAL | Status: DC | PRN

## 2015-12-29 MED ORDER — AMLODIPINE BESYLATE 2.5 MG PO TABS
2.5000 mg | ORAL_TABLET | Freq: Every morning | ORAL | Status: DC
  Administered 2015-12-30 (×2): 2.5 mg via ORAL
  Filled 2015-12-29 (×2): qty 1

## 2015-12-29 NOTE — ED Notes (Signed)
Attempted report x1. 

## 2015-12-29 NOTE — H&P (Signed)
History and Physical    Valerie Chen [id number] DOB: August 13, 1928 DOA: 12/29/2015  PCP: Monico Blitz, MD  Patient coming from:   Home. Patient's family requested [facility] admit instead of [facility]  Chief Complaint:  unresponsiveness  HPI: Valerie Chen is a 82 y.o. female with dementia. She livers with daughter and also has a Psychologist, counselling present several hours a day. At baseline patient ambulates without assistance, she is able to feed herself . At times patient is oriented, other times not. Patient seemed tired this morning. Daughter helped patient to the toilet, shortly after patient's left arm began to shake and patient slumped over becoming unresponsive. Daughter estimates patient was unresponsive for 10 minutes. Patient had taken her morning meds as usual. She consumed adequate amount of breakfast. Nothing unusual occurred leading up to the episode of syncope per daughter.   Over the last several days patient has complained of severe right lower extremity pain. The pain has been in the area of her right knee and right calf. The pain has prompted 2 or 3 emergency department visits at [facility] as well as [facility]. X-rays and ultrasounds of the leg have been unremarkable. Patient was seen by Ortho (Dr. Delfino Lovett) on Friday. MRI ordered, follow up appointment on Wed to review results. Today patient's pain is is right groin. Daughter and caregiver note increased urination. Patient: 7 and 8 times a day. Urine is large volume, not malodorous. No recent medication changes.    ED Course:  Afebrile, heart rate in upper fifties, normal oxygen saturation Negative point-of-care troponin CBG 91 CBC unremarkable Urinalysis unremarkable No acute findings on head CT EKG reveals sinus bradycardia rate 53, borderline prolonged PR interval, right axis deviation. QTC 397  Review of Systems: Unable to obtain, patient has dementia  Past Medical History  Diagnosis Date  .  Chest pain     Hospital February, 2012, nuclear, possible anteroseptal and inferoseptal ischemia, technically difficult, medical therapy  . Dyslipidemia   . Orthostatic hypotension   . Migraines     25 year  . Dementia     Significant  . Ejection fraction     EF 55%, echo, February, 2012  //   EF 55-60%, echo, February 25, 2012  . Diastolic dysfunction     Echo, February, 2012  . TIA (transient ischemic attack)        ???question of a TIA in February, 2012, carotid Doppler, August 11, 2010 no significant abnormalities  . GI bleed     February, 2012 incomplete colonoscopy, hemorrhoids, needs complete colonoscopy  . CAD (coronary artery disease)     Probable CAD, nuclear February, 2012, possible anteroseptal and inferoseptal ischemia but the study was technically difficult  . Hyperlipidemia   . Edema     hospitalizations September, 2013, some fluid overload  . DNR (do not resuscitate)   . Major depression ([place])     2 overdoses in 1975 & 1977  . Arthritis   . Alzheimer disease     Past Surgical History  Procedure Laterality Date  . Abdominal hysterectomy    . Diagnostic mammogram  12/29/2000    Mammogram, suspicious lesions -BX pending  . Tonsillectomy      Social History   Social History  . Marital Status: Widowed    Spouse Name: N/A  . Number of Children: 3  . Years of Education: N/A   Occupational History  . RETIRED    Social History Main Topics  . Smoking status: Never Smoker   .  Smokeless tobacco: Never Used  . Alcohol Use: No  . Drug Use: No  . Sexual Activity: No   Other Topics Concern  . Not on file   Social History Narrative   2 husbands   3 children      Diet: Heart Health   Caffeine- yes, tea   Married- widow   House- 1 stories, 2 persons   Pets- no, puppy died 1 1/2 years ago   Current/Past profession- Retired Engineer, manufacturing systems   Exercise- Yes, walking   Living Will-Yes   DNR- Yes   POA/HPOA- Yes      Lives with daughter, walks and feeds self  independantly Allergies  Allergen Reactions  . Levaquin [Levofloxacin] Other (See Comments)    ALTERED MENTAL STATUS    Family History  Problem Relation Age of Onset  . Heart attack Father     several heart attacks  . Hypertension Daughter   . Cancer Father   . Suicidality Mother   . Heart attack Brother   . Heart disease Brother   . Pneumonia Brother   . Suicidality Son   . Arthritis Father   . Depression Other     Most family members    Prior to Admission medications   Medication Sig Start Date End Date Taking? Authorizing Provider  acetaminophen (TYLENOL) 500 MG tablet Take 1,000 mg by mouth every 6 (six) hours as needed for mild pain.   Yes Historical Provider, MD  amLODipine (NORVASC) 5 MG tablet Take 2.5 mg by mouth every morning.    Yes Historical Provider, MD  aspirin EC 81 MG tablet Take 81 mg by mouth every morning.   Yes Historical Provider, MD  CRANBERRY EXTRACT PO Take 1,533 mg by mouth 3 (three) times daily after meals.    Yes Historical Provider, MD  cyanocobalamin 500 MCG tablet Take 500 mcg by mouth every morning.    Yes Historical Provider, MD  docusate sodium (COLACE) 100 MG capsule Take 2 capsules (200 mg total) by mouth 2 (two) times daily as needed for mild constipation. Patient taking differently: Take 200 mg by mouth daily.  02/05/15  Yes Florencia Reasons, MD  furosemide (LASIX) 40 MG tablet Take 40 mg by mouth every morning. 04/05/12  Yes Scott T Kathlen Mody, PA-C  LORazepam (ATIVAN) 0.5 MG tablet Take 0.5 mg by mouth 2 (two) times daily as needed for anxiety.  11/27/15  Yes Historical Provider, MD  potassium chloride SA (K-DUR,KLOR-CON) 20 MEQ tablet Take 40 mEq by mouth every morning.  11/20/14  Yes Gildardo Cranker, DO  pravastatin (PRAVACHOL) 20 MG tablet Take 10 mg by mouth every evening.    Yes Historical Provider, MD  QUEtiapine (SEROQUEL) 25 MG tablet Take 25 mg by mouth daily. @900  in the morning.   Yes Historical Provider, MD  isosorbide mononitrate (IMDUR) 30 MG 24  hr tablet TAKE 1/2 TABLET BY MOUTH DAILY CUT FOR PATIENT Patient not taking: Reported on 12/22/2015 04/27/13   Carlena Bjornstad, MD    Physical Exam: Filed Vitals:   12/29/15 1345 12/29/15 1430 12/29/15 1500 12/29/15 1532  BP: 139/58 132/63 142/66 159/56  Pulse: 67 65 58 61  Temp:      Resp: 27 26 22 18   SpO2: 100% 97% 99% 100%    Constitutional:  Well developed white female in NAD, calm, comfortable Filed Vitals:   12/29/15 1345 12/29/15 1430 12/29/15 1500 12/29/15 1532  BP: 139/58 132/63 142/66 159/56  Pulse: 67 65 58 61  Temp:  Resp: 27 26 22 18   SpO2: 100% 97% 99% 100%   Eyes: PER, lids and conjunctivae normal ENMT: Mucous membranes are moist. Posterior pharynx clear of any exudate or lesions..  Neck: normal, supple, no masses Respiratory: clear to auscultation bilaterally, no wheezing, no crackles. Normal respiratory effort. No accessory muscle use.  Cardiovascular: Regular rate and rhythm, no murmurs / rubs / gallops. No extremity edema. 2+ pedal pulses.   Abdomen: no tenderness, no masses palpated. No hepatomegaly. Bowel sounds positive.  Musculoskeletal: no clubbing / cyanosis. No joint deformity upper and lower extremities. Good ROM, no contractures. Normal muscle tone.  Skin: no rashes, lesions, ulcers.  Neurologic: CN 2-12 grossly intact. Sensation intact, Strength 5/5 in all 4.  Psychiatric: Normal judgment and insight. Alert and oriented x 3. Normal mood.    Labs on Admission: I have personally reviewed following labs and imaging studies  Urine analysis:    Component Value Date/Time   COLORURINE YELLOW 12/29/2015 [place] 12/29/2015 1340   LABSPEC 1.013 12/29/2015 1340   PHURINE 6.5 12/29/2015 1340   GLUCOSEU NEGATIVE 12/29/2015 1340   [place] 12/29/2015 1340   BILIRUBINUR NEGATIVE 12/29/2015 1340   KETONESUR NEGATIVE 12/29/2015 1340   PROTEINUR NEGATIVE 12/29/2015 1340   UROBILINOGEN 0.2 03/01/2015 0930   NITRITE NEGATIVE  12/29/2015 1340   LEUKOCYTESUR NEGATIVE 12/29/2015 1340    Radiological Exams on Admission: Ct Head Wo Contrast  12/29/2015  CLINICAL DATA:  Possible seizure today. Intermittent headache and dizziness for the past month. EXAM: CT HEAD WITHOUT CONTRAST TECHNIQUE: Contiguous axial images were obtained from the base of the skull through the vertex without intravenous contrast. COMPARISON:  Head CT dated 07/18/2015 and MRI brain dated 11/01/2014. FINDINGS: Brain: Again noted is mild generalized age related atrophy with commensurate dilatation of the ventricles and sulci. Chronic small vessel ischemic changes again noted within the bilateral periventricular and subcortical white matter regions. There is no mass, hemorrhage, edema or other evidence of acute parenchymal abnormality. No extra-axial hemorrhage. Vascular: No hyperdense vessel or unexpected calcification. There are chronic calcified atherosclerotic changes of the large vessels at the skull base. Skull: Negative for fracture or focal lesion. Sinuses/Orbits: No acute findings. Visualized upper paranasal sinuses are clear. Other: None. IMPRESSION: 1. No acute intracranial findings. No intracranial mass, hemorrhage or edema. 2. Atrophy and chronic ischemic changes in the white matter, stable compared to previous exams. Electronically Signed   By: Franki Cabot M.D.   On: 12/29/2015 13:02   Mr Hip Right Wo Contrast  12/28/2015  CLINICAL DATA:  Right hip pain, swelling and difficulty weight-bearing for 10 days. EXAM: MR OF THE RIGHT HIP WITHOUT CONTRAST TECHNIQUE: Multiplanar, multisequence MR imaging was performed. No intravenous contrast was administered. COMPARISON:  12/25/2015 FINDINGS: Bones: Both hips are normally located. Moderate degenerative changes bilaterally but no stress fracture or AVN. No hip joint effusion. No periarticular fluid collections to suggest a paralabral cyst. Mild bilateral peritendinitis without trochanteric bursitis.  Degenerative changes at pubic symphysis and SI joints but no pelvic fractures or bone lesions. The surrounding hip and pelvic musculature are unremarkable. Muscle tear, myositis or mass. Mild age related fatty atrophy. No significant intra pelvic abnormalities. No inguinal mass or hernia. IMPRESSION: Mild to moderate bilateral hip joint degenerative changes but no fracture or AVN. No acute muscle injury. Mild bilateral peritendinitis but no findings trochanteric bursitis. Electronically Signed   By: Marijo Sanes M.D.   On: 12/28/2015 13:18    EKG: Independently reviewed.  EKG Interpretation  Date/Time:  Monday December 29 2015 11:04:33 EDT Ventricular Rate:  53 PR Interval:    QRS Duration: 88 QT Interval:  422 QTC Calculation: 397 R Axis:   98 Text Interpretation:  Sinus bradycardia Borderline prolonged PR interval Right axis deviation Low voltage, precordial leads Baseline wander in lead(s) I II aVR Confirmed by Hazle Coca [phone]) on 12/29/2015 12:06:06 PM       Assessment/Plan   Principal Problem:   Syncope Active Problems:   Dementia   Diastolic dysfunction   Essential hypertension   UTI (urinary tract infection)      Syncope. Episode occurred while on toilet having bowel movement. Vasovagal? Cardiac arrhythmia? Head CT unrevealing .          -place in OBS - Tele -Orthostatic vitals -Syncope order set utilized -follow up on echocardiogram  RLE pain. Three recent ED visit with negative workup including knee xray, u/s with doppler and right hip MRI . She has follow appt with Ortho Wed. Etiology of pain unclear.  - PT evaluation.  Hypertension, controlled -continue Norvasc  Hyperlipidemia. -continue statin  Dementia. At baseline patient walks and feeds self independently. Needs assistance with bathing. Oriented at times.  -continue Seroquel and prn Ativan  DVT prophylaxis:   Lovenox Code Status:      DNR  Family Communication:   Treatment plan discussed with daughter in  room. She understands / agrees with plans .  Disposition Plan: Discharge home  in 24-48 hours              Consults called:    None  Admission status:  Observation -  Telemetry.     Tye Savoy NP [facility] Pager [phone]  If 7PM-7AM, please contact night-coverage [email or web address] Password [facility]  12/29/2015, 3:36 PM

## 2015-12-29 NOTE — ED Notes (Signed)
Assisted Comptroller with In and Out

## 2015-12-29 NOTE — ED Provider Notes (Signed)
CSN: [id number]     Arrival date & time 12/29/15  1053 History   First MD Initiated Contact with Patient 12/29/15 1139     Chief Complaint  Patient presents with  . Loss of Consciousness     Patient is a 82 y.o. female presenting with syncope. The history is provided by a relative. No language interpreter was used.  Loss of Consciousness  Valerie Chen is a 82 y.o. female who presents to the Emergency Department complaining of syncope.  Level V caveat due to dementia.  Hx obtained from patient's daughter.  This morning the patient was sitting on the commode and having a bowel movement. The daughter was there to assist and noticed a tremor in her left arm and her eyes appear to glaze over. She was then unresponsive and limp for several minutes. The daughter notes that her skin felt very cold to the touch at that time. EMS was called and she continued to have decreased responsiveness for several minutes but slowly began to return to baseline. Daughter reports that over the last week the patient has been reporting increased pain in her right knee and has had difficulty ambulating due to the pain. He she has been seen in the emergency department and seen by orthopedics and had an MRI yesterday. Daughter reports that she had a difficult night last night and was often awake complaining of pain in her knee. She also reported a headache earlier this morning as well as frequent urination. No fevers, vomiting, diarrhea.  Past Medical History  Diagnosis Date  . Chest pain     Hospital February, 2012, nuclear, possible anteroseptal and inferoseptal ischemia, technically difficult, medical therapy  . Dyslipidemia   . Orthostatic hypotension   . Migraines     25 year  . Dementia     Significant  . Ejection fraction     EF 55%, echo, February, 2012  //   EF 55-60%, echo, February 25, 2012  . Diastolic dysfunction     Echo, February, 2012  . TIA (transient ischemic attack)        ???question of a TIA in  February, 2012, carotid Doppler, August 11, 2010 no significant abnormalities  . GI bleed     February, 2012 incomplete colonoscopy, hemorrhoids, needs complete colonoscopy  . CAD (coronary artery disease)     Probable CAD, nuclear February, 2012, possible anteroseptal and inferoseptal ischemia but the study was technically difficult  . Hyperlipidemia   . Edema     hospitalizations September, 2013, some fluid overload  . DNR (do not resuscitate)   . Major depression ([place])     2 overdoses in 1975 & 1977  . Arthritis   . Alzheimer disease    Past Surgical History  Procedure Laterality Date  . Abdominal hysterectomy    . Diagnostic mammogram  12/29/2000    Mammogram, suspicious lesions -BX pending  . Tonsillectomy     Family History  Problem Relation Age of Onset  . Heart attack Father     several heart attacks  . Hypertension Daughter   . Cancer Father   . Suicidality Mother   . Heart attack Brother   . Heart disease Brother   . Pneumonia Brother   . Suicidality Son   . Arthritis Father   . Depression Other     Most family members   Social History  Substance Use Topics  . Smoking status: Never Smoker   . Smokeless tobacco: Never Used  . Alcohol  Use: No   OB History    No data available     Review of Systems  Unable to perform ROS: Dementia  Cardiovascular: Positive for syncope.      Allergies  Levaquin  Home Medications   Prior to Admission medications   Medication Sig Start Date End Date Taking? Authorizing Provider  acetaminophen (TYLENOL) 500 MG tablet Take 1,000 mg by mouth every 6 (six) hours as needed for mild pain.   Yes Historical Provider, MD  amLODipine (NORVASC) 5 MG tablet Take 2.5 mg by mouth every morning.    Yes Historical Provider, MD  aspirin EC 81 MG tablet Take 81 mg by mouth every morning.   Yes Historical Provider, MD  CRANBERRY EXTRACT PO Take 1,533 mg by mouth 3 (three) times daily after meals.    Yes Historical Provider, MD   cyanocobalamin 500 MCG tablet Take 500 mcg by mouth every morning.    Yes Historical Provider, MD  docusate sodium (COLACE) 100 MG capsule Take 2 capsules (200 mg total) by mouth 2 (two) times daily as needed for mild constipation. Patient taking differently: Take 200 mg by mouth daily.  02/05/15  Yes Florencia Reasons, MD  furosemide (LASIX) 40 MG tablet Take 40 mg by mouth every morning. 04/05/12  Yes Scott T Kathlen Mody, PA-C  LORazepam (ATIVAN) 0.5 MG tablet Take 0.5 mg by mouth 2 (two) times daily as needed for anxiety.  11/27/15  Yes Historical Provider, MD  potassium chloride SA (K-DUR,KLOR-CON) 20 MEQ tablet Take 40 mEq by mouth every morning.  11/20/14  Yes Gildardo Cranker, DO  pravastatin (PRAVACHOL) 20 MG tablet Take 10 mg by mouth every evening.    Yes Historical Provider, MD  QUEtiapine (SEROQUEL) 25 MG tablet Take 25 mg by mouth daily. @900  in the morning.   Yes Historical Provider, MD  isosorbide mononitrate (IMDUR) 30 MG 24 hr tablet TAKE 1/2 TABLET BY MOUTH DAILY CUT FOR PATIENT Patient not taking: Reported on 12/22/2015 04/27/13   Carlena Bjornstad, MD   BP 142/66 mmHg  Pulse 58  Temp(Src) 97.4 F (36.3 C)  Resp 22  SpO2 99% Physical Exam  Constitutional: She appears well-developed and well-nourished.  HENT:  Head: Normocephalic and atraumatic.  Eyes: EOM are normal. Pupils are equal, round, and reactive to light.  Cardiovascular: Normal rate.   No murmur heard. bradycardic  Pulmonary/Chest: Effort normal and breath sounds normal. No respiratory distress.  Abdominal: Soft. There is no tenderness. There is no rebound and no guarding.  Musculoskeletal: She exhibits no edema or tenderness.  Neurological: She is alert.  Mildly confused, disoriented to place and time. Generalized weakness. Sensation to light touch intact in all 4 extremities.  Skin: Skin is warm and dry.  Psychiatric:  Flat affect  Nursing note and vitals reviewed.   ED Course  Procedures (including critical care time) Labs  Review Labs Reviewed  CBC - Abnormal; Notable for the following:    Hemoglobin 15.3 (*)    All other components within normal limits  BASIC METABOLIC PANEL  URINALYSIS, ROUTINE W REFLEX MICROSCOPIC (NOT AT [facility])  TSH  CBG MONITORING, ED  I-STAT TROPOININ, ED    Imaging Review Ct Head Wo Contrast  12/29/2015  CLINICAL DATA:  Possible seizure today. Intermittent headache and dizziness for the past month. EXAM: CT HEAD WITHOUT CONTRAST TECHNIQUE: Contiguous axial images were obtained from the base of the skull through the vertex without intravenous contrast. COMPARISON:  Head CT dated 07/18/2015 and MRI brain dated  11/01/2014. FINDINGS: Brain: Again noted is mild generalized age related atrophy with commensurate dilatation of the ventricles and sulci. Chronic small vessel ischemic changes again noted within the bilateral periventricular and subcortical white matter regions. There is no mass, hemorrhage, edema or other evidence of acute parenchymal abnormality. No extra-axial hemorrhage. Vascular: No hyperdense vessel or unexpected calcification. There are chronic calcified atherosclerotic changes of the large vessels at the skull base. Skull: Negative for fracture or focal lesion. Sinuses/Orbits: No acute findings. Visualized upper paranasal sinuses are clear. Other: None. IMPRESSION: 1. No acute intracranial findings. No intracranial mass, hemorrhage or edema. 2. Atrophy and chronic ischemic changes in the white matter, stable compared to previous exams. Electronically Signed   By: Franki Cabot M.D.   On: 12/29/2015 13:02   Mr Hip Right Wo Contrast  12/28/2015  CLINICAL DATA:  Right hip pain, swelling and difficulty weight-bearing for 10 days. EXAM: MR OF THE RIGHT HIP WITHOUT CONTRAST TECHNIQUE: Multiplanar, multisequence MR imaging was performed. No intravenous contrast was administered. COMPARISON:  12/25/2015 FINDINGS: Bones: Both hips are normally located. Moderate degenerative changes  bilaterally but no stress fracture or AVN. No hip joint effusion. No periarticular fluid collections to suggest a paralabral cyst. Mild bilateral peritendinitis without trochanteric bursitis. Degenerative changes at pubic symphysis and SI joints but no pelvic fractures or bone lesions. The surrounding hip and pelvic musculature are unremarkable. Muscle tear, myositis or mass. Mild age related fatty atrophy. No significant intra pelvic abnormalities. No inguinal mass or hernia. IMPRESSION: Mild to moderate bilateral hip joint degenerative changes but no fracture or AVN. No acute muscle injury. Mild bilateral peritendinitis but no findings trochanteric bursitis. Electronically Signed   By: Marijo Sanes M.D.   On: 12/28/2015 13:18   I have personally reviewed and evaluated these images and lab results as part of my medical decision-making.   EKG Interpretation   Date/Time:  Monday December 29 2015 11:04:33 EDT Ventricular Rate:  53 PR Interval:    QRS Duration: 88 QT Interval:  422 QTC Calculation: 397 R Axis:   98 Text Interpretation:  Sinus bradycardia Borderline prolonged PR interval  Right axis deviation Low voltage, precordial leads Baseline wander in  lead(s) I II aVR Confirmed by Hazle Coca [phone]) on 12/29/2015 12:06:06 PM      MDM   Final diagnoses:  Syncope, unspecified syncope type    Patient here for evaluation following syncopal event versus seizure. She has a nonfocal neurologic examination on ED arrival and has Alzheimer's that limited history and examination. Plan to admit for observation for syncope versus seizure. Discussed case with hospitalist who agrees to admit.    Quintella Reichert, MD 12/29/15 1511

## 2015-12-29 NOTE — ED Notes (Signed)
Per EMS - pt from home. Pt found unresponsive by daughter while using restroom this morning. Possible syncopal episode. Negative stroke screen. Hx alzheimers, baseline is oriented to self, situation and disoriented to time.

## 2015-12-29 NOTE — ED Notes (Signed)
Full bed change, assisted by Tyteanna Ost- NT and Elizabeth-NT.

## 2015-12-29 NOTE — Progress Notes (Signed)
Patient trasfered from ED to 5W30 via stretcher; alert and oriented x 2; no complaints of pain; IV saline locked in LAC; skin intact. Orient patient to room and unit; gave patient care guide; family at bedside; instructed how to use the call bell and fall risk precautions. Will continue to monitor the patient.

## 2015-12-30 ENCOUNTER — Observation Stay (HOSPITAL_BASED_OUTPATIENT_CLINIC_OR_DEPARTMENT_OTHER): Payer: Medicare Other

## 2015-12-30 DIAGNOSIS — R55 Syncope and collapse: Secondary | ICD-10-CM | POA: Diagnosis not present

## 2015-12-30 DIAGNOSIS — I10 Essential (primary) hypertension: Secondary | ICD-10-CM | POA: Diagnosis not present

## 2015-12-30 DIAGNOSIS — F0391 Unspecified dementia with behavioral disturbance: Secondary | ICD-10-CM | POA: Diagnosis not present

## 2015-12-30 DIAGNOSIS — I519 Heart disease, unspecified: Secondary | ICD-10-CM | POA: Diagnosis not present

## 2015-12-30 LAB — CBC
HEMATOCRIT: 47.3 % — AB (ref 36.0–46.0)
Hemoglobin: 15.9 g/dL — ABNORMAL HIGH (ref 12.0–15.0)
MCH: 30.6 pg (ref 26.0–34.0)
MCHC: 33.6 g/dL (ref 30.0–36.0)
MCV: 91 fL (ref 78.0–100.0)
Platelets: 187 10*3/uL (ref 150–400)
RBC: 5.2 MIL/uL — ABNORMAL HIGH (ref 3.87–5.11)
RDW: 12.8 % (ref 11.5–15.5)
WBC: 5 10*3/uL (ref 4.0–10.5)

## 2015-12-30 LAB — BASIC METABOLIC PANEL
Anion gap: 7 (ref 5–15)
BUN: 10 mg/dL (ref 6–20)
CO2: 27 mmol/L (ref 22–32)
Calcium: 9.3 mg/dL (ref 8.9–10.3)
Chloride: 106 mmol/L (ref 101–111)
Creatinine, Ser: 0.72 mg/dL (ref 0.44–1.00)
GFR calc Af Amer: >60 mL/min (ref >60)
GLUCOSE: 96 mg/dL (ref 65–99)
POTASSIUM: 3.7 mmol/L (ref 3.5–5.1)
Sodium: 140 mmol/L (ref 135–145)

## 2015-12-30 LAB — GLUCOSE, CAPILLARY: Glucose-Capillary: 98 mg/dL (ref 65–99)

## 2015-12-30 LAB — ECHOCARDIOGRAM COMPLETE: Weight: 2903.02 oz

## 2015-12-30 MED ORDER — POTASSIUM CHLORIDE CRYS ER 20 MEQ PO TBCR: 40.0000 meq | EXTENDED_RELEASE_TABLET | Freq: Every morning | ORAL | Status: DC

## 2015-12-30 MED ORDER — SODIUM CHLORIDE 0.9 % IV SOLN
INTRAVENOUS | Status: DC
  Administered 2015-12-30: 11:00:00 via INTRAVENOUS

## 2015-12-30 MED ORDER — MIRABEGRON ER 25 MG PO TB24: 25.0000 mg | ORAL_TABLET | Freq: Every day | ORAL | Status: DC

## 2015-12-30 MED ORDER — MIRABEGRON ER 25 MG PO TB24
25.0000 mg | ORAL_TABLET | Freq: Every day | ORAL | Status: DC
  Administered 2015-12-30: 25 mg via ORAL
  Filled 2015-12-30: qty 1

## 2015-12-30 MED ORDER — FUROSEMIDE 40 MG PO TABS: 40.0000 mg | ORAL_TABLET | Freq: Every morning | ORAL | Status: DC

## 2015-12-30 MED ORDER — DICLOFENAC SODIUM 1 % TD GEL: 4.0000 g | Freq: Four times a day (QID) | TRANSDERMAL | Status: DC | PRN

## 2015-12-30 NOTE — Care Management Obs Status (Signed)
[facility] NOTIFICATION   Patient Details  Name: Valerie Chen MRN: [id number] Date of Birth: 20-Jan-1929   Medicare Observation Status Notification Given:  Yes    Carles Collet, RN 12/30/2015, 11:46 AM

## 2015-12-30 NOTE — Discharge Summary (Addendum)
Physician Discharge Summary   Patient ID: Valerie Chen MRN: [id number] DOB/AGE: Jan 07, 1929 82 y.o.  Admit date: 12/29/2015 Discharge date: 12/30/2015  Primary Care Physician:  Monico Blitz, MD  Discharge Diagnoses:    . Syncope, vasovagal    dehydration  . Dementia . Diastolic dysfunction . Essential hypertension   Urinary incontinence  Consults:  None  Recommendations for Outpatient Follow-up:  1. Home health PT, OT to be arranged by case management  2. Please repeat CBC/BMET at next visit    DIET: heart healthy    Allergies:   Allergies  Allergen Reactions  . Levaquin [Levofloxacin] Other (See Comments)    ALTERED MENTAL STATUS     DISCHARGE MEDICATIONS: Current Discharge Medication List    START taking these medications   Details  diclofenac sodium (VOLTAREN) 1 % GEL Apply 4 g topically 4 (four) times daily as needed (leg pain). Qty: 100 g, Refills: 3    mirabegron ER (MYRBETRIQ) 25 MG TB24 tablet Take 1 tablet (25 mg total) by mouth daily. Take for 1 week, then have revaluation by MD Qty: 30 tablet, Refills: 3      CONTINUE these medications which have CHANGED   Details  furosemide (LASIX) 40 MG tablet Take 1 tablet (40 mg total) by mouth every morning. HOLD for next 4 days Qty: 30 tablet    potassium chloride SA (K-DUR,KLOR-CON) 20 MEQ tablet Take 2 tablets (40 mEq total) by mouth every morning. Hold for 4 days Qty: 60 tablet, Refills: 6      CONTINUE these medications which have NOT CHANGED   Details  acetaminophen (TYLENOL) 500 MG tablet Take 1,000 mg by mouth every 6 (six) hours as needed for mild pain.    amLODipine (NORVASC) 5 MG tablet Take 2.5 mg by mouth every morning.     aspirin EC 81 MG tablet Take 81 mg by mouth every morning.    CRANBERRY EXTRACT PO Take 1,533 mg by mouth 3 (three) times daily after meals.     cyanocobalamin 500 MCG tablet Take 500 mcg by mouth every morning.     docusate sodium (COLACE) 100 MG capsule Take 2  capsules (200 mg total) by mouth 2 (two) times daily as needed for mild constipation. Qty: 10 capsule, Refills: 0    LORazepam (ATIVAN) 0.5 MG tablet Take 0.5 mg by mouth 2 (two) times daily as needed for anxiety.  Refills: 1    pravastatin (PRAVACHOL) 20 MG tablet Take 10 mg by mouth every evening.     QUEtiapine (SEROQUEL) 25 MG tablet Take 25 mg by mouth daily. @900  in the morning.    isosorbide mononitrate (IMDUR) 30 MG 24 hr tablet TAKE 1/2 TABLET BY MOUTH DAILY CUT FOR PATIENT Qty: 15 tablet, Refills: 1         Brief H and P: For complete details please refer to admission H and P, but in brief Patient is a 82 year old female with dementia who lives with her daughter and has Psychologist, prison and probation services care. Patient presented with syncopal episode. At baseline patient ambulates without assistance, she is able to feed herself . At times patient is oriented, other times not. Patient seemed tired this morning. Daughter helped patient to the toilet, shortly after patient's left arm began to shake and patient slumped over becoming unresponsive. Daughter estimates patient was unresponsive for 10 minutes. Patient had taken her morning meds as usual. She consumed adequate amount of breakfast. Nothing unusual occurred leading up to the episode of syncope per daughter.  Over the last several days patient has complained of severe right lower extremity pain. The pain has been in the area of her right knee and right calf. The pain has prompted 2 or 3 emergency department visits at [facility] as well as [facility]. X-rays and ultrasounds of the leg have been unremarkable. Patient was seen by Ortho (Dr. Delfino Lovett) on Friday. MRI ordered, follow up appointment on Wed to review results. Today patient's pain is is right groin. Daughter and caregiver note increased urination 7 and 8 times a day. Urine is large volume, not malodorous. No recent medication changes.   Hospital Course:  Syncope. Episode occurred  while on toilet having bowel movement. Vasovagal - CT head was negative, patient was placed on telemetry, no arrhythmias noted  -Orthostatic vitals were negative. - 2-D echo showed EF of 55-60% with grade 1 diastolic dysfunction, no wall motion abnormalities - Patient recommended to hold the Lasix for 4 days. Stay hydrated at home.   RLE pain. Three recent ED visit with negative workup including knee xray, u/s with doppler and right hip MRI . She has follow appt with Ortho Wed. Etiology of pain unclear.  - At the time of examination, no pain, PT/OT evaluation recommended home health PT, OT. - may benefit from intrarticular steroid injection in knee but will defer to ortho outpatient   Hypertension, controlled -continue Norvasc  Hyperlipidemia. -continue statin  Dementia. At baseline patient walks and feeds self independently. Needs assistance with bathing. Oriented at times.  -continue Seroquel and prn Ativan   Day of Discharge BP 139/57 mmHg  Pulse 71  Temp(Src) 98.5 F (36.9 C) (Oral)  Resp 16  Wt 82.3 kg (181 lb 7 oz)  SpO2 93%  Physical Exam: General: Alert and awake oriented x3 not in any acute distress. HEENT: anicteric sclera, pupils reactive to light and accommodation CVS: S1-S2 clear no murmur rubs or gallops Chest: clear to auscultation bilaterally, no wheezing rales or rhonchi Abdomen: soft nontender, nondistended, normal bowel sounds Extremities: no cyanosis, clubbing or edema noted bilaterally Neuro: Cranial nerves II-XII intact, no focal neurological deficits   The results of significant diagnostics from this hospitalization (including imaging, microbiology, ancillary and laboratory) are listed below for reference.    LAB RESULTS: Basic Metabolic Panel:  Recent Labs Lab 12/29/15 1110 12/30/15 0743  NA 140 140  K 3.6 3.7  CL 107 106  CO2 26 27  GLUCOSE 98 96  BUN 12 10  CREATININE 0.82 0.72  CALCIUM 9.2 9.3   Liver Function Tests: No  results for input(s): AST, ALT, ALKPHOS, BILITOT, PROT, ALBUMIN in the last 168 hours. No results for input(s): LIPASE, AMYLASE in the last 168 hours. No results for input(s): AMMONIA in the last 168 hours. CBC:  Recent Labs Lab 12/29/15 1110 12/30/15 0743  WBC 5.2 5.0  HGB 15.3* 15.9*  HCT 45.9 47.3*  MCV 91.4 91.0  PLT 191 187   Cardiac Enzymes: No results for input(s): CKTOTAL, CKMB, CKMBINDEX, TROPONINI in the last 168 hours. BNP: Invalid input(s): POCBNP CBG:  Recent Labs Lab 12/29/15 1118 12/30/15 0628  GLUCAP 91 98    Significant Diagnostic Studies:  Ct Head Wo Contrast  12/29/2015  CLINICAL DATA:  Possible seizure today. Intermittent headache and dizziness for the past month. EXAM: CT HEAD WITHOUT CONTRAST TECHNIQUE: Contiguous axial images were obtained from the base of the skull through the vertex without intravenous contrast. COMPARISON:  Head CT dated 07/18/2015 and MRI brain dated 11/01/2014. FINDINGS: Brain: Again  noted is mild generalized age related atrophy with commensurate dilatation of the ventricles and sulci. Chronic small vessel ischemic changes again noted within the bilateral periventricular and subcortical white matter regions. There is no mass, hemorrhage, edema or other evidence of acute parenchymal abnormality. No extra-axial hemorrhage. Vascular: No hyperdense vessel or unexpected calcification. There are chronic calcified atherosclerotic changes of the large vessels at the skull base. Skull: Negative for fracture or focal lesion. Sinuses/Orbits: No acute findings. Visualized upper paranasal sinuses are clear. Other: None. IMPRESSION: 1. No acute intracranial findings. No intracranial mass, hemorrhage or edema. 2. Atrophy and chronic ischemic changes in the white matter, stable compared to previous exams. Electronically Signed   By: Franki Cabot M.D.   On: 12/29/2015 13:02    2D ECHO:  Study Conclusions  - Left ventricle: The cavity size was normal.  Wall thickness was  normal. Systolic function was normal. The estimated ejection  fraction was in the range of 55% to 60%. Wall motion was normal;  there were no regional wall motion abnormalities. Doppler  parameters are consistent with abnormal left ventricular  relaxation (grade 1 diastolic dysfunction). - Aortic valve: There was mild regurgitation.  Disposition and Follow-up:    DISPOSITION: home    DISCHARGE FOLLOW-UP Follow-up Information    Follow up with [facility] [place], MD. Schedule an appointment as soon as possible for a visit in 10 days.   Specialty:  Internal Medicine   Why:  for hospital follow-up   Contact information:   [place] [address] [phone]        Time spent on Discharge: 22mins   Signed:   Huntley Demedeiros M.D. [facility] 12/30/2015, 1:14 PM Pager: [phone]  Addendum coding query Vasovagal syncope. Discharge summary addended.   Sevannah Madia M.D. [facility] 01/07/2016, 3:29 PM  Pager: [phone]

## 2015-12-30 NOTE — Progress Notes (Signed)
  Echocardiogram 2D Echocardiogram has been performed.  Altie Savard 12/30/2015, 9:15 AM

## 2015-12-30 NOTE — Progress Notes (Signed)
Notified K.Schoorr about pt blood pressures this morning. Was told to give 10 am BP medication now. Will continue to monitor

## 2015-12-30 NOTE — Care Management Note (Addendum)
Case Management Note  Patient Details  Name: Valerie Chen MRN: [id number] Date of Birth: May 16, 1929  Subjective/Objective:                 Patient in obs for syncope. Patient lives with daughter. Patient has 24/7 supervision either by daugter or caregiver who was at pt bedside. 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 They expressed no needs for additional DME. They would like to use [facility] for [facility] if needed. PT eval pending.   Action/Plan:  CM will continue to follow for DC planning. Addendum- Referral made to [facility] for [facility].  Expected Discharge Date:                  Expected Discharge Plan:  [place]  In-House Referral:     Discharge planning Services  CM Consult  Post Acute Care Choice:    Choice offered to:  Adult Children  DME Arranged:    DME Agency:     HH Arranged:    HH Agency:     Status of Service:  In process, will continue to follow  If discussed at Long Length of Stay Meetings, dates discussed:    Additional Comments:  Carles Collet, RN 12/30/2015, 11:47 AM

## 2015-12-30 NOTE — Progress Notes (Signed)
Pt ready for discharge. Pt is alert. Pt is hemodynamically stable. AVS reviewed with family. Capable of re verbalizing medication regimen. Discharge plan appropriate and in place.

## 2015-12-30 NOTE — Progress Notes (Signed)
Physical Therapy Evaluation Patient Details Name: Valerie Chen MRN: [id number] DOB: 02-15-1929 Today's Date: 12/30/2015   History of Present Illness  pt is an 82 y/o female with h/o dementia (Alzheimers), CAD, diastolic dysfunction,  admitted after becoming unresponsive for about 10 minutes.  Also of note are complaints be patient of knee, hip and calf pain on the R LE at varying times that has not allowed her to ambulate over the last week.  Clinical Impression  Pt still close enough to her baseline function that pt's daughter and outside caregiver can safely manage her at home, but pt could benefit from HHPT to recondition, strengthen and to work on mobility.    Follow Up Recommendations Home health PT;Supervision for mobility/OOB    Equipment Recommendations  None recommended by PT    Recommendations for Other Services       Precautions / Restrictions Precautions Precautions: Fall      Mobility  Bed Mobility Overal bed mobility: Needs Assistance Bed Mobility: Supine to Sit     Supine to sit: Min guard     General bed mobility comments: slow to get to EOB cues for redirection to task and guard for safety.  Transfers Overall transfer level: Needs assistance   Transfers: Sit to/from Stand Sit to Stand: Min assist         General transfer comment: taking steps in place increased R knee pain.  Ambulation/Gait Ambulation/Gait assistance: Min assist Ambulation Distance (Feet): 18 Feet Assistive device: Rolling walker (2 wheeled) Gait Pattern/deviations: Step-through pattern     General Gait Details: short tentative steps, not really antalgic, but pt reported that even using the RW to hep unweight R LE still did not fully deal with the pain.  Stairs            Wheelchair Mobility    Modified Rankin (Stroke Patients Only)       Balance Overall balance assessment: Needs assistance Sitting-balance support: No upper extremity supported Sitting  balance-Leahy Scale: Fair Sitting balance - Comments: steady sitting EOB   Standing balance support: Single extremity supported;No upper extremity supported Standing balance-Leahy Scale: Fair                               Pertinent Vitals/Pain Pain Assessment: Faces Faces Pain Scale: Hurts little more Pain Location: R knee Pain Descriptors / Indicators: Sore Pain Intervention(s): Monitored during session;Repositioned;Limited activity within patient's tolerance    Home Living Family/patient expects to be discharged to:: Private residence Living Arrangements: Children Available Help at Discharge: Family;Personal care attendant;Available 24 hours/day Type of Home: House Home Access: Stairs to enter   [employer] of Steps: 1 Home Layout: One level Home Equipment: Marine scientist - quad      Prior Function Level of Independence: Needs assistance   Gait / Transfers Assistance Needed: supervision  ADL's / Homemaking Assistance Needed: completes all ADLs except bathing-requires min/modA and mod assist for socks, starting pants over feet and then pt can pull up. family or CNA provides steady assist to step over tub.        Hand Dominance        Extremity/Trunk Assessment   Upper Extremity Assessment: Overall WFL for tasks assessed           Lower Extremity Assessment: Generalized weakness;Overall [facility] for tasks assessed (R weaker than L, no pain on MMT, but likely due to arthritis)         Communication  Communication: No difficulties  Cognition Arousal/Alertness: Awake/alert Behavior During Therapy: Flat affect Overall Cognitive Status: Within Functional Limits for tasks assessed                      General Comments      Exercises        Assessment/Plan    PT Assessment All further PT needs can be met in the next venue of care  PT Diagnosis Generalized weakness;Acute pain   PT Problem List Decreased strength;Decreased  balance;Decreased mobility;Pain;Decreased activity tolerance  PT Treatment Interventions     PT Goals (Current goals can be found in the Care Plan section) Acute Rehab PT Goals PT Goal Formulation: All assessment and education complete, DC therapy    Frequency     Barriers to discharge        Co-evaluation               End of Session   Activity Tolerance: Patient tolerated treatment well;Patient limited by pain Patient left: in bed;with call bell/phone within reach;with family/visitor present (sitting EOB) Nurse Communication: Mobility status    Functional Assessment Tool Used: clinical judgement Functional Limitation: Mobility: Walking and moving around Mobility: Walking and Moving Around Current Status (T2671): At least 1 percent but less than 20 percent impaired, limited or restricted Mobility: Walking and Moving Around Goal Status [phone]): At least 1 percent but less than 20 percent impaired, limited or restricted Mobility: Walking and Moving Around Discharge Status [phone]): At least 1 percent but less than 20 percent impaired, limited or restricted    Time: 1551-1620 PT Time Calculation (min) (ACUTE ONLY): 29 min   Charges:   PT Evaluation $PT Eval Moderate Complexity: 1 Procedure PT Treatments $Therapeutic Activity: 8-22 mins   PT G Codes:   PT G-Codes **NOT FOR INPATIENT CLASS** Functional Assessment Tool Used: clinical judgement Functional Limitation: Mobility: Walking and moving around Mobility: Walking and Moving Around Current Status (A2505): At least 1 percent but less than 20 percent impaired, limited or restricted Mobility: Walking and Moving Around Goal Status [phone]): At least 1 percent but less than 20 percent impaired, limited or restricted Mobility: Walking and Moving Around Discharge Status [phone]): At least 1 percent but less than 20 percent impaired, limited or restricted    Jerred Zaremba, Tessie Fass 12/30/2015, 4:36 PM  12/30/2015  Donnella Sham, [place] [phone]  (pager)

## 2015-12-31 DIAGNOSIS — Z7982 Long term (current) use of aspirin: Secondary | ICD-10-CM | POA: Diagnosis not present

## 2015-12-31 DIAGNOSIS — F0280 Dementia in other diseases classified elsewhere without behavioral disturbance: Secondary | ICD-10-CM | POA: Diagnosis not present

## 2015-12-31 DIAGNOSIS — F329 Major depressive disorder, single episode, unspecified: Secondary | ICD-10-CM | POA: Diagnosis not present

## 2015-12-31 DIAGNOSIS — I2510 Atherosclerotic heart disease of native coronary artery without angina pectoris: Secondary | ICD-10-CM | POA: Diagnosis not present

## 2015-12-31 DIAGNOSIS — I110 Hypertensive heart disease with heart failure: Secondary | ICD-10-CM | POA: Diagnosis not present

## 2015-12-31 DIAGNOSIS — Z8673 Personal history of transient ischemic attack (TIA), and cerebral infarction without residual deficits: Secondary | ICD-10-CM | POA: Diagnosis not present

## 2015-12-31 DIAGNOSIS — Z87440 Personal history of urinary (tract) infections: Secondary | ICD-10-CM | POA: Diagnosis not present

## 2015-12-31 DIAGNOSIS — M150 Primary generalized (osteo)arthritis: Secondary | ICD-10-CM | POA: Diagnosis not present

## 2015-12-31 DIAGNOSIS — I5030 Unspecified diastolic (congestive) heart failure: Secondary | ICD-10-CM | POA: Diagnosis not present

## 2015-12-31 DIAGNOSIS — E785 Hyperlipidemia, unspecified: Secondary | ICD-10-CM | POA: Diagnosis not present

## 2015-12-31 DIAGNOSIS — G309 Alzheimer's disease, unspecified: Secondary | ICD-10-CM | POA: Diagnosis not present

## 2016-01-01 DIAGNOSIS — R350 Frequency of micturition: Secondary | ICD-10-CM | POA: Diagnosis not present

## 2016-01-01 DIAGNOSIS — Z66 Do not resuscitate: Secondary | ICD-10-CM | POA: Diagnosis not present

## 2016-01-06 DIAGNOSIS — I2510 Atherosclerotic heart disease of native coronary artery without angina pectoris: Secondary | ICD-10-CM | POA: Diagnosis not present

## 2016-01-06 DIAGNOSIS — F0280 Dementia in other diseases classified elsewhere without behavioral disturbance: Secondary | ICD-10-CM | POA: Diagnosis not present

## 2016-01-06 DIAGNOSIS — I110 Hypertensive heart disease with heart failure: Secondary | ICD-10-CM | POA: Diagnosis not present

## 2016-01-06 DIAGNOSIS — M25561 Pain in right knee: Secondary | ICD-10-CM | POA: Diagnosis not present

## 2016-01-06 DIAGNOSIS — Z87440 Personal history of urinary (tract) infections: Secondary | ICD-10-CM | POA: Diagnosis not present

## 2016-01-06 DIAGNOSIS — G309 Alzheimer's disease, unspecified: Secondary | ICD-10-CM | POA: Diagnosis not present

## 2016-01-06 DIAGNOSIS — I5030 Unspecified diastolic (congestive) heart failure: Secondary | ICD-10-CM | POA: Diagnosis not present

## 2016-01-06 DIAGNOSIS — M25551 Pain in right hip: Secondary | ICD-10-CM | POA: Diagnosis not present

## 2016-01-08 DIAGNOSIS — F0280 Dementia in other diseases classified elsewhere without behavioral disturbance: Secondary | ICD-10-CM | POA: Diagnosis not present

## 2016-01-08 DIAGNOSIS — Z87440 Personal history of urinary (tract) infections: Secondary | ICD-10-CM | POA: Diagnosis not present

## 2016-01-08 DIAGNOSIS — I5030 Unspecified diastolic (congestive) heart failure: Secondary | ICD-10-CM | POA: Diagnosis not present

## 2016-01-08 DIAGNOSIS — G309 Alzheimer's disease, unspecified: Secondary | ICD-10-CM | POA: Diagnosis not present

## 2016-01-08 DIAGNOSIS — I110 Hypertensive heart disease with heart failure: Secondary | ICD-10-CM | POA: Diagnosis not present

## 2016-01-08 DIAGNOSIS — I2510 Atherosclerotic heart disease of native coronary artery without angina pectoris: Secondary | ICD-10-CM | POA: Diagnosis not present

## 2016-01-14 ENCOUNTER — Other Ambulatory Visit: Payer: Self-pay | Admitting: Orthopedic Surgery

## 2016-01-14 DIAGNOSIS — M542 Cervicalgia: Secondary | ICD-10-CM

## 2016-01-14 DIAGNOSIS — M545 Low back pain: Secondary | ICD-10-CM | POA: Diagnosis not present

## 2016-01-18 ENCOUNTER — Ambulatory Visit
Admission: RE | Admit: 2016-01-18 | Discharge: 2016-01-18 | Disposition: A | Discharge status: Home / Self Care | Payer: Medicare Other | Source: Ambulatory Visit | Attending: Orthopedic Surgery | Admitting: Orthopedic Surgery

## 2016-01-18 DIAGNOSIS — M4802 Spinal stenosis, cervical region: Secondary | ICD-10-CM | POA: Diagnosis not present

## 2016-01-18 DIAGNOSIS — M542 Cervicalgia: Secondary | ICD-10-CM

## 2016-01-18 DIAGNOSIS — M545 Low back pain: Secondary | ICD-10-CM

## 2016-01-18 DIAGNOSIS — M4806 Spinal stenosis, lumbar region: Secondary | ICD-10-CM | POA: Diagnosis not present

## 2016-01-21 DIAGNOSIS — M545 Low back pain: Secondary | ICD-10-CM | POA: Diagnosis not present

## 2016-01-21 DIAGNOSIS — M25551 Pain in right hip: Secondary | ICD-10-CM | POA: Diagnosis not present

## 2016-01-21 DIAGNOSIS — M542 Cervicalgia: Secondary | ICD-10-CM | POA: Diagnosis not present

## 2016-01-28 DIAGNOSIS — G309 Alzheimer's disease, unspecified: Secondary | ICD-10-CM | POA: Diagnosis not present

## 2016-01-28 DIAGNOSIS — F0280 Dementia in other diseases classified elsewhere without behavioral disturbance: Secondary | ICD-10-CM | POA: Diagnosis not present

## 2016-01-28 DIAGNOSIS — N390 Urinary tract infection, site not specified: Secondary | ICD-10-CM | POA: Diagnosis not present

## 2016-02-09 DIAGNOSIS — F329 Major depressive disorder, single episode, unspecified: Secondary | ICD-10-CM | POA: Diagnosis not present

## 2016-02-09 DIAGNOSIS — G309 Alzheimer's disease, unspecified: Secondary | ICD-10-CM | POA: Diagnosis not present

## 2016-02-09 DIAGNOSIS — F0390 Unspecified dementia without behavioral disturbance: Secondary | ICD-10-CM | POA: Diagnosis not present

## 2016-02-09 DIAGNOSIS — F0280 Dementia in other diseases classified elsewhere without behavioral disturbance: Secondary | ICD-10-CM | POA: Diagnosis not present

## 2016-03-15 ENCOUNTER — Encounter: Payer: Self-pay | Admitting: Neurology

## 2016-03-15 ENCOUNTER — Ambulatory Visit (INDEPENDENT_AMBULATORY_CARE_PROVIDER_SITE_OTHER): Payer: Medicare Other | Admitting: Neurology

## 2016-03-15 VITALS — BP 129/71 | HR 71 | Ht 64.0 in | Wt 190.0 lb

## 2016-03-15 DIAGNOSIS — M25561 Pain in right knee: Secondary | ICD-10-CM

## 2016-03-15 DIAGNOSIS — M25569 Pain in unspecified knee: Secondary | ICD-10-CM | POA: Insufficient documentation

## 2016-03-15 DIAGNOSIS — F03918 Unspecified dementia, unspecified severity, with other behavioral disturbance: Secondary | ICD-10-CM

## 2016-03-15 DIAGNOSIS — F0391 Unspecified dementia with behavioral disturbance: Secondary | ICD-10-CM

## 2016-03-15 DIAGNOSIS — M25562 Pain in left knee: Secondary | ICD-10-CM | POA: Diagnosis not present

## 2016-03-15 NOTE — Progress Notes (Signed)
PATIENT: Valerie Chen DOB: 05-Sep-1928  Chief Complaint  Patient presents with  . Bilateral Leg Pain    She has been having bilateral leg pain but it has improved since discontinuing her pravastatin in June.  She also has problme with swelling in both legs.  She wears knee-high compression socks daily.  She walks thirty minutes each day.     HISTORICAL  Valerie Chen is 82 years old right-handed female, accompanied by her daughter Rolm Bookbinder,  seen in refer by her primary care physician Dr.  Monico Blitz for evaluation of bilateral lower extremity pain.  I reviewed and summarized the referring note, she had a history of hypertension, carries a diagnosis of Alzheimer disease with behavior issues, is taking seroquel 25 mg every morning this is managed by her psychologist Dr. Geanie Kenning at [facility] daughter reported significant improvement with seroquel major depression, she has been dealing with depression episodes for extended period of time, had history of attempted over dose in the past History of coronary artery disease  She has lived with her daughter Fraser Din since 2014, before that she lived in various assisted living, but with worsening memory loss, she has increased difficulty to be taken care of at assistant living  She was on provastatin since 2014, around May 2017 she began to complains of bilateral lower extremity pain, bilateral knee pain, to the point of stay in bed, could not move about, she has to be taken to the hospital by paramedics, I reviewed evaluation at the hospital stay, laboratory evaluation in July 2017, normal CMP, CBC, TSH, urine analysis I have personally reviewed MRI of cervical and lumbar spine in July 2017, there is evidence of multilevel degenerative changes, no significant foraminal or canal stenosis.    Statin was stopped since, about a week later daughter noticed significant improvement, she now can ambulate without assistance, continue complains of  intermittent bilateral knee pain right worse than left, was seen by orthopedic, had right knee injection with mild improvement, she is taking over-the-counter cumeric and Biofreeze which has helped her symptoms,  At baseline, she had a significant memory loss, but still able to dress, bathing with some help, able to feed herself, ambulate without assistance, sleeps well, no incontinence,  It is very difficult to get history from her because of psychomotor slowing, and the memory loss, she only complains of mild low back pain.   REVIEW OF SYSTEMS: Full 14 system review of systems performed and notable only for swelling in legs, hearing loss, easy bruising, achy muscles, running nose, memory loss, confusion, headaches, depression, anxiety, racing thoughts.  ALLERGIES: Allergies  Allergen Reactions  . Levaquin [Levofloxacin] Other (See Comments)    ALTERED MENTAL STATUS    HOME MEDICATIONS: Current Outpatient Prescriptions  Medication Sig Dispense Refill  . acetaminophen (TYLENOL) 500 MG tablet Take 1,000 mg by mouth every 6 (six) hours as needed for mild pain.    Marland Kitchen amLODipine (NORVASC) 5 MG tablet Take 2.5 mg by mouth every morning.     Marland Kitchen aspirin EC 81 MG tablet Take 81 mg by mouth every morning.    Marland Kitchen CRANBERRY EXTRACT PO Take 1,533 mg by mouth 3 (three) times daily after meals.     . cyanocobalamin 500 MCG tablet Take 500 mcg by mouth every morning.     . docusate sodium (COLACE) 100 MG capsule Take 2 capsules (200 mg total) by mouth 2 (two) times daily as needed for mild constipation. (Patient taking differently: Take 200 mg  by mouth daily. ) 10 capsule 0  . furosemide (LASIX) 40 MG tablet Take 1 tablet (40 mg total) by mouth every morning. HOLD for next 4 days 30 tablet   . LORazepam (ATIVAN) 0.5 MG tablet Take 0.5 mg by mouth 2 (two) times daily as needed for anxiety.   1  . mirabegron ER (MYRBETRIQ) 25 MG TB24 tablet Take 1 tablet (25 mg total) by mouth daily. Take for 1 week, then have  revaluation by MD 30 tablet 3  . potassium chloride SA (K-DUR,KLOR-CON) 20 MEQ tablet Take 2 tablets (40 mEq total) by mouth every morning. Hold for 4 days 60 tablet 6  . QUEtiapine (SEROQUEL) 25 MG tablet Take 25 mg by mouth daily. _0  in the morning.    Marland Kitchen Ubiquinol 100 MG CAPS Take by mouth daily.    Marland Kitchen UNABLE TO FIND Curamin - Take 3 tablets after each meal.    . UNABLE TO FIND Solgar V - takes vitamin daily.     No current facility-administered medications for this visit.     PAST MEDICAL HISTORY: Past Medical History:  Diagnosis Date  . Alzheimer disease   . Arthritis   . CAD (coronary artery disease)    Probable CAD, nuclear February, 2012, possible anteroseptal and inferoseptal ischemia but the study was technically difficult  . Chest pain    Hospital February, 2012, nuclear, possible anteroseptal and inferoseptal ischemia, technically difficult, medical therapy  . Dementia    Significant  . Diastolic dysfunction    Echo, February, 2012  . Diastolic dysfunction   . DNR (do not resuscitate)   . Dyslipidemia   . Edema    hospitalizations September, 2013, some fluid overload  . Ejection fraction    EF 55%, echo, February, 2012  //   EF 55-60%, echo, February 25, 2012  . Essential hypertension   . GI bleed    February, 2012 incomplete colonoscopy, hemorrhoids, needs complete colonoscopy  . Hx: UTI (urinary tract infection)   . Hyperlipidemia   . Leg pain, bilateral   . Major depression ([place])    2 overdoses in 1975 & 1977  . Migraines    25 year  . Orthostatic hypotension   . Suicide attempt ([place]) 1985   2 attempts with Valium  . TIA (transient ischemic attack)       ???question of a TIA in February, 2012, carotid Doppler, August 11, 2010 no significant abnormalities    PAST SURGICAL HISTORY: Past Surgical History:  Procedure Laterality Date  . ABDOMINAL HYSTERECTOMY    . DIAGNOSTIC MAMMOGRAM  12/29/2000   Mammogram, suspicious lesions -BX pending  .  TONSILLECTOMY      FAMILY HISTORY: Family History  Problem Relation Age of Onset  . Heart attack Father     several heart attacks  . Cancer Father     bone  . Arthritis Father   . Suicidality Mother   . Depression Mother   . Heart attack Brother   . Heart disease Brother   . Pneumonia Brother   . Suicidality Son   . Hypertension Daughter   . Depression Other     Most family members    SOCIAL HISTORY:  Social History   Social History  . Marital status: Widowed    Spouse name: N/A  . Number of children: 3  . Years of education: HS   Occupational History  . RETIRED    Social History Main Topics  . Smoking status: Never Smoker  .  Smokeless tobacco: Never Used  . Alcohol use No  . Drug use: No  . Sexual activity: No   Other Topics Concern  . Not on file   Social History Narrative   2 husbands   3 children      Diet: Heart Health   Caffeine- yes, tea   Married- widow   House- 1 stories, 2 persons   Pets- no, puppy died 1 1/2 years ago   Current/Past profession- Retired Engineer, manufacturing systems   Exercise- Yes, walking   Living Will-Yes   DNR- Yes   POA/HPOA- Yes   Right-handed.        PHYSICAL EXAM   Vitals:   03/15/16 1132  BP: 129/71  Pulse: 71  Weight: 190 lb (86.2 kg)  Height: _0  (1.626 m)    Not recorded      Body mass index is 32.61 kg/m.  PHYSICAL EXAMNIATION:  Gen: NAD, conversant, well nourised, obese, well groomed                     Cardiovascular: Regular rate rhythm, no peripheral edema, warm, nontender. Eyes: Conjunctivae clear without exudates or hemorrhage Neck: Supple, no carotid bruise. Pulmonary: Clear to auscultation bilaterally   NEUROLOGICAL EXAM:  MENTAL STATUS: Speech:    Speech is slow, has to repeat few times for her to follow commands Cognition:Mini-Mental Status Examination 10/30, she has significant psychomotor slowing, slow responding      Orientation to time, place and person     Normal recent and remote  memory     Normal Attention span and concentration     Normal Language, naming, redecreased spontaneous speech    Fund of knowledge   CRANIAL NERVES: CN II: Visual fields are full to confrontation. Fundoscopic exam is normal with sharp discs and no vascular changes. Pupils are round equal and briskly reactive to light. CN III, IV, VI: extraocular movement are normal. No ptosis. CN V: Facial sensation is intact to pinprick in all 3 divisions bilaterally. Corneal responses are intact.  CN VII: Face is symmetric with normal eye closure and smile. CN VIII: Hearing is normal to rubbing fingers CN IX, X: Palate elevates symmetrically. Phonation is normal. CN XI: Head turning and shoulder shrug are intact CN XII: Tongue is midline with normal movements and no atrophy.  MOTOR: There is no pronator drift of out-stretched arms. Muscle bulk and tone are normal. Muscle strength is normal.  REFLEXES: Reflexes are 2+ and symmetric at the biceps, triceps, knees, and ankles. Plantar responses are flexor.  SENSORY: Intact to light touch, pinprick, positional sensation and vibratory sensation are intact in fingers and toes.  COORDINATION: Rapid alternating movements and fine finger movements are intact. There is no dysmetria on finger-to-nose and heel-knee-shin.    GAIT/STANCE: She needs to push on chair arm to get up from seated position, cautious, mildly unsteady    DIAGNOSTIC DATA (LABS, IMAGING, TESTING) - I reviewed patient records, labs, notes, testing and imaging myself where available.   ASSESSMENT AND PLAN  Arva Slaugh is a 82 y.o. female   bilateral lower extremity pain, knee pain  Musculoskeletal etiology, no evidence of lumbar stenosis by exam, MRI of lumbar spine dementia with behavior issues Long-standing history of major depression  Laboratory evaluation to rule out treatable etiologies such as inflammatory myopathy, autoimmune process  CPK, ANA, ESR C-reactive protein  I  will notify family the laboratory result, overall she has showed significant improvement, continue moderate exercise,   Meshulem Onorato  Krista Blue, M.D. Ph.D.  [facility] [address], [place], [place] [address] Ph: [phone] Fax: [phone]  CC: Monico Blitz, MD

## 2016-03-25 DIAGNOSIS — S0083XA Contusion of other part of head, initial encounter: Secondary | ICD-10-CM | POA: Diagnosis not present

## 2016-03-25 DIAGNOSIS — H6120 Impacted cerumen, unspecified ear: Secondary | ICD-10-CM | POA: Diagnosis not present

## 2016-03-25 DIAGNOSIS — H9190 Unspecified hearing loss, unspecified ear: Secondary | ICD-10-CM | POA: Diagnosis not present

## 2016-04-16 DIAGNOSIS — R300 Dysuria: Secondary | ICD-10-CM | POA: Diagnosis not present

## 2016-04-21 DIAGNOSIS — R300 Dysuria: Secondary | ICD-10-CM | POA: Diagnosis not present

## 2016-04-23 DIAGNOSIS — F331 Major depressive disorder, recurrent, moderate: Secondary | ICD-10-CM | POA: Diagnosis not present

## 2016-05-02 DIAGNOSIS — Z23 Encounter for immunization: Secondary | ICD-10-CM | POA: Diagnosis not present

## 2016-05-03 ENCOUNTER — Emergency Department (HOSPITAL_COMMUNITY)
Admission: EM | Admit: 2016-05-03 | Discharge: 2016-05-03 | Disposition: A | Discharge status: Home / Self Care | Payer: Medicare Other | Attending: Emergency Medicine | Admitting: Emergency Medicine

## 2016-05-03 ENCOUNTER — Emergency Department (HOSPITAL_COMMUNITY): Payer: Medicare Other

## 2016-05-03 ENCOUNTER — Encounter (HOSPITAL_COMMUNITY): Payer: Self-pay | Admitting: Emergency Medicine

## 2016-05-03 DIAGNOSIS — I2510 Atherosclerotic heart disease of native coronary artery without angina pectoris: Secondary | ICD-10-CM | POA: Diagnosis not present

## 2016-05-03 DIAGNOSIS — I10 Essential (primary) hypertension: Secondary | ICD-10-CM | POA: Insufficient documentation

## 2016-05-03 DIAGNOSIS — R402421 Glasgow coma scale score 9-12, in the field [EMT or ambulance]: Secondary | ICD-10-CM | POA: Diagnosis not present

## 2016-05-03 DIAGNOSIS — Z7982 Long term (current) use of aspirin: Secondary | ICD-10-CM | POA: Diagnosis not present

## 2016-05-03 DIAGNOSIS — G459 Transient cerebral ischemic attack, unspecified: Secondary | ICD-10-CM | POA: Diagnosis present

## 2016-05-03 DIAGNOSIS — Z79899 Other long term (current) drug therapy: Secondary | ICD-10-CM | POA: Insufficient documentation

## 2016-05-03 DIAGNOSIS — R55 Syncope and collapse: Secondary | ICD-10-CM | POA: Insufficient documentation

## 2016-05-03 DIAGNOSIS — R4020 Unspecified coma: Secondary | ICD-10-CM

## 2016-05-03 DIAGNOSIS — R4182 Altered mental status, unspecified: Secondary | ICD-10-CM | POA: Diagnosis not present

## 2016-05-03 LAB — COMPREHENSIVE METABOLIC PANEL
ALK PHOS: 59 U/L (ref 38–126)
ALT: 21 U/L (ref 14–54)
AST: 23 U/L (ref 15–41)
Albumin: 3.7 g/dL (ref 3.5–5.0)
Anion gap: 8 (ref 5–15)
BILIRUBIN TOTAL: 0.9 mg/dL (ref 0.3–1.2)
BUN: 12 mg/dL (ref 6–20)
CALCIUM: 9.2 mg/dL (ref 8.9–10.3)
CO2: 26 mmol/L (ref 22–32)
CREATININE: 0.88 mg/dL (ref 0.44–1.00)
Chloride: 105 mmol/L (ref 101–111)
GFR calc non Af Amer: 58 mL/min — ABNORMAL LOW (ref >60)
GLUCOSE: 110 mg/dL — AB (ref 65–99)
Potassium: 3.6 mmol/L (ref 3.5–5.1)
SODIUM: 139 mmol/L (ref 135–145)
TOTAL PROTEIN: 5.9 g/dL — AB (ref 6.5–8.1)

## 2016-05-03 LAB — URINALYSIS, ROUTINE W REFLEX MICROSCOPIC
BILIRUBIN URINE: NEGATIVE
GLUCOSE, UA: NEGATIVE mg/dL
Hgb urine dipstick: NEGATIVE
KETONES UR: NEGATIVE mg/dL
Leukocytes, UA: NEGATIVE
NITRITE: NEGATIVE
PH: 6 (ref 5.0–8.0)
PROTEIN: NEGATIVE mg/dL
Specific Gravity, Urine: 1.02 (ref 1.005–1.030)

## 2016-05-03 LAB — CBC WITH DIFFERENTIAL/PLATELET
Basophils Absolute: 0 10*3/uL (ref 0.0–0.1)
Basophils Relative: 0 %
EOS ABS: 0 10*3/uL (ref 0.0–0.7)
Eosinophils Relative: 0 %
HEMATOCRIT: 44.9 % (ref 36.0–46.0)
HEMOGLOBIN: 15.2 g/dL — AB (ref 12.0–15.0)
LYMPHS ABS: 1 10*3/uL (ref 0.7–4.0)
LYMPHS PCT: 19 %
MCH: 30.9 pg (ref 26.0–34.0)
MCHC: 33.9 g/dL (ref 30.0–36.0)
MCV: 91.3 fL (ref 78.0–100.0)
MONOS PCT: 8 %
Monocytes Absolute: 0.4 10*3/uL (ref 0.1–1.0)
NEUTROS ABS: 3.8 10*3/uL (ref 1.7–7.7)
NEUTROS PCT: 73 %
Platelets: 159 10*3/uL (ref 150–400)
RBC: 4.92 MIL/uL (ref 3.87–5.11)
RDW: 12.8 % (ref 11.5–15.5)
WBC: 5.2 10*3/uL (ref 4.0–10.5)

## 2016-05-03 NOTE — ED Notes (Signed)
Physician has been in room to speak with family re: discharge to home.  Pt anxious to leave.  Awaiting discharge papers.

## 2016-05-03 NOTE — ED Notes (Signed)
Patient denies pain and is resting comfortably.  

## 2016-05-03 NOTE — ED Triage Notes (Signed)
Pt here from home with c/o  An unresponsive episode  Upon arrival to the ED pt is back at her baseline

## 2016-05-03 NOTE — ED Provider Notes (Signed)
[facility] DEPT Provider Note   CSN: [id number] Arrival date & time: 05/03/16  [id number]     History   Chief Complaint Chief Complaint  Patient presents with  . Transient Ischemic Attack    HPI Valerie Chen is a 82 y.o. female.  Level V caveat: Dementia  HPI Patient is brought to the emergency department by EMS after a altered mental status and unresponsiveness while at home.  She has long-standing history of dementia.  She is a DO NOT RESUSCITATE.  Currently her family is primarily doing nonaggressive and near comfort measures but is interested in working her up.  She been no normal state of health and had a normal breakfast when suddenly she began to turn colors in her lips became unresponsive for the nurse aide.  [facility] took her to the ground.  She never lost pulse.  On EMS arrival the patient regained consciousness although was somewhat confused.  Family does report that she's had more episodes of staring spells over the past several weeks.  No prior history of seizures.  No tongue biting.  No urinary incontinence.  At this time the patient is returned to baseline.  Patient has no complaints at this time.   Past Medical History:  Diagnosis Date  . Alzheimer disease   . Arthritis   . CAD (coronary artery disease)    Probable CAD, nuclear February, 2012, possible anteroseptal and inferoseptal ischemia but the study was technically difficult  . Chest pain    Hospital February, 2012, nuclear, possible anteroseptal and inferoseptal ischemia, technically difficult, medical therapy  . Dementia    Significant  . Diastolic dysfunction    Echo, February, 2012  . Diastolic dysfunction   . DNR (do not resuscitate)   . Dyslipidemia   . Edema    hospitalizations September, 2013, some fluid overload  . Ejection fraction    EF 55%, echo, February, 2012  //   EF 55-60%, echo, February 25, 2012  . Essential hypertension   . GI bleed    February, 2012 incomplete colonoscopy,  hemorrhoids, needs complete colonoscopy  . Hx: UTI (urinary tract infection)   . Hyperlipidemia   . Leg pain, bilateral   . Major depression    2 overdoses in 1975 & 1977  . Migraines    25 year  . Orthostatic hypotension   . Suicide attempt 1985   2 attempts with Valium  . TIA (transient ischemic attack)       ???question of a TIA in February, 2012, carotid Doppler, August 11, 2010 no significant abnormalities    Patient Active Problem List   Diagnosis Date Noted  . Pain in joint, lower leg 03/15/2016  . Syncope 12/29/2015  . UTI (urinary tract infection) 12/29/2015  . HCAP (healthcare-associated pneumonia) 02/01/2015  . Hypoxia 02/01/2015  . Recurrent UTI 02/01/2015  . Neck pain   . Essential hypertension 05/30/2013  . Ejection fraction   . Edema   . Lower extremity edema 02/25/2012  . CAD (coronary artery disease)   . Chest pain   . Orthostatic hypotension   . Migraines   . Dementia   . Diastolic dysfunction   . TIA (transient ischemic attack)   . GI bleed   . Dyslipidemia     Past Surgical History:  Procedure Laterality Date  . ABDOMINAL HYSTERECTOMY    . DIAGNOSTIC MAMMOGRAM  12/29/2000   Mammogram, suspicious lesions -BX pending  . TONSILLECTOMY      OB History    No data  available       Home Medications    Prior to Admission medications   Medication Sig Start Date End Date Taking? Authorizing Provider  amLODipine (NORVASC) 5 MG tablet Take 2.5 mg by mouth every morning.    Yes Historical Provider, MD  aspirin EC 81 MG tablet Take 81 mg by mouth every morning.   Yes Historical Provider, MD  CRANBERRY EXTRACT PO Take 4,200 mg by mouth 3 (three) times daily after meals.    Yes Historical Provider, MD  cyanocobalamin 500 MCG tablet Take 500 mcg by mouth every morning.    Yes Historical Provider, MD  docusate sodium (COLACE) 100 MG capsule Take 2 capsules (200 mg total) by mouth 2 (two) times daily as needed for mild constipation. Patient taking  differently: Take 200 mg by mouth daily.  02/05/15  Yes Florencia Reasons, MD  furosemide (LASIX) 40 MG tablet Take 1 tablet (40 mg total) by mouth every morning. HOLD for next 4 days Patient taking differently: Take 20 mg by mouth every morning. HOLD for next 4 days 12/30/15  Yes Ripudeep K Rai, MD  LORazepam (ATIVAN) 0.5 MG tablet Take 0.125 mg by mouth daily as needed for anxiety.  11/27/15  Yes Historical Provider, MD  OVER THE COUNTER MEDICATION Take 1 tablet by mouth 3 (three) times daily with meals. Curamin   Yes Historical Provider, MD  potassium chloride SA (K-DUR,KLOR-CON) 20 MEQ tablet Take 2 tablets (40 mEq total) by mouth every morning. Hold for 4 days Patient taking differently: Take 20 mEq by mouth every morning.  12/30/15  Yes Ripudeep K Rai, MD  QUEtiapine (SEROQUEL) 25 MG tablet Take 25 mg by mouth daily. @900  in the morning.   Yes Historical Provider, MD  Ubiquinol 100 MG CAPS Take by mouth daily.   Yes Historical Provider, MD  UNABLE TO FIND Solgar V - takes vitamin daily.   Yes Historical Provider, MD  mirabegron ER (MYRBETRIQ) 25 MG TB24 tablet Take 1 tablet (25 mg total) by mouth daily. Take for 1 week, then have revaluation by MD Patient not taking: Reported on 05/03/2016 12/30/15   Ripudeep Krystal Eaton, MD    Family History Family History  Problem Relation Age of Onset  . Heart attack Father     several heart attacks  . Cancer Father     bone  . Arthritis Father   . Suicidality Mother   . Depression Mother   . Heart attack Brother   . Heart disease Brother   . Pneumonia Brother   . Suicidality Son   . Hypertension Daughter   . Depression Other     Most family members    Social History Social History  Substance Use Topics  . Smoking status: Never Smoker  . Smokeless tobacco: Never Used  . Alcohol use No     Allergies   Levaquin [levofloxacin]   Review of Systems Review of Systems  Unable to perform ROS: Dementia     Physical Exam Updated Vital Signs BP 127/62    Pulse 62   Temp 97.7 F (36.5 C)   Resp 17   SpO2 97%   Physical Exam  Constitutional: She appears well-developed and well-nourished. No distress.  HENT:  Head: Normocephalic and atraumatic.  No tongue biting marks noted  Eyes: EOM are normal. Pupils are equal, round, and reactive to light.  Neck: Normal range of motion.  Cardiovascular: Normal rate, regular rhythm and normal heart sounds.   Pulmonary/Chest: Effort normal and breath  sounds normal.  Abdominal: Soft. She exhibits no distension. There is no tenderness.  Musculoskeletal: Normal range of motion.  Neurological: She is alert.  5/5 strength in major muscle groups of  bilateral upper and lower extremities. Speech normal. No facial asymetry.  Oriented to person but not place or time  Skin: Skin is warm and dry.  Psychiatric: She has a normal mood and affect. Judgment normal.  Nursing note and vitals reviewed.    ED Treatments / Results  Labs (all labs ordered are listed, but only abnormal results are displayed) Labs Reviewed  CBC WITH DIFFERENTIAL/PLATELET - Abnormal; Notable for the following:       Result Value   Hemoglobin 15.2 (*)    All other components within normal limits  COMPREHENSIVE METABOLIC PANEL - Abnormal; Notable for the following:    Glucose, Bld 110 (*)    Total Protein 5.9 (*)    GFR calc non Af Amer 58 (*)    All other components within normal limits  URINALYSIS, ROUTINE W REFLEX MICROSCOPIC (NOT AT [facility])    EKG  EKG Interpretation  Date/Time:  Monday May 03 2016 09:52:51 EST Ventricular Rate:  70 PR Interval:    QRS Duration: 95 QT Interval:  404 QTC Calculation: 436 R Axis:   84 Text Interpretation:  Sinus rhythm Borderline right axis deviation Low voltage, precordial leads No significant change was found Confirmed by Chamia Schmutz  MD, Lennette Bihari ([id number]) on 05/03/2016 12:57:25 PM       Radiology Ct Head Wo Contrast  Result Date: 05/03/2016 CLINICAL DATA:  82 year old female with  altered mental status. Initial encounter. EXAM: CT HEAD WITHOUT CONTRAST TECHNIQUE: Contiguous axial images were obtained from the base of the skull through the vertex without intravenous contrast. COMPARISON:  Noncontrast head CT 12/29/2015 and earlier. FINDINGS: Brain: Mild for age chronic cerebral white matter hypodensity appears stable. Otherwise Gray-white matter differentiation is within normal limits throughout the brain. No midline shift, ventriculomegaly, mass effect, evidence of mass lesion, intracranial hemorrhage or evidence of cortically based acute infarction. Vascular: No suspicious intracranial vascular hyperdensity. Skull: Stable visualized osseous structures. Chronic right TMJ degeneration. Sinuses/Orbits: Visualized paranasal sinuses and mastoids are stable and well pneumatized. Other: No acute orbit or scalp soft tissue finding. IMPRESSION: No acute intracranial abnormality. Stable mild for age nonspecific white matter changes. Electronically Signed   By: Genevie Ann M.D.   On: 05/03/2016 12:14    Procedures Procedures (including critical care time)  Medications Ordered in ED Medications - No data to display   Initial Impression / Assessment and Plan / ED Course  I have reviewed the triage vital signs and the nursing notes.  Pertinent labs & imaging results that were available during my care of the patient were reviewed by me and considered in my medical decision making (see chart for details).  Clinical Course     Patient remained stable while in the emergency department.  Telemetry was reviewed and no ectopy was noted.  Workup is without significant abnormalities.  I do wonder if the patient could be having subclinical seizures at home and this may have been a seizure-like episode today.   Family prefers to take the patient home.  I do not think she needs admission this time.  I do think she may need outpatient EEG.  Outpatient neurology referral.  Primary care referral.   Patients family understands to return to the ER for new or worsening symptoms  Final Clinical Impressions(s) / ED Diagnoses   Final  diagnoses:  None    New Prescriptions New Prescriptions   No medications on file     Jola Schmidt, MD 05/03/16 1327

## 2016-05-03 NOTE — ED Notes (Signed)
Caregiver states she understands instructions. Pt home stable via wc.

## 2016-05-03 NOTE — ED Notes (Signed)
Pt speech is slow and deliberate;  Family reports it is her [place].

## 2016-05-03 NOTE — ED Notes (Signed)
Pt went to CT; has now returned.

## 2016-05-03 NOTE — ED Notes (Signed)
Placed patient into a gown and on the monitor 

## 2016-05-06 ENCOUNTER — Encounter (HOSPITAL_COMMUNITY): Payer: Self-pay | Admitting: Nurse Practitioner

## 2016-05-06 ENCOUNTER — Observation Stay (HOSPITAL_COMMUNITY): Payer: Medicare Other

## 2016-05-06 ENCOUNTER — Observation Stay (HOSPITAL_COMMUNITY)
Admission: EM | Admit: 2016-05-06 | Discharge: 2016-05-08 | Disposition: A | Discharge status: Home / Self Care | Payer: Medicare Other | Attending: Family Medicine | Admitting: Family Medicine

## 2016-05-06 DIAGNOSIS — R001 Bradycardia, unspecified: Secondary | ICD-10-CM | POA: Insufficient documentation

## 2016-05-06 DIAGNOSIS — Z8673 Personal history of transient ischemic attack (TIA), and cerebral infarction without residual deficits: Secondary | ICD-10-CM | POA: Insufficient documentation

## 2016-05-06 DIAGNOSIS — I6782 Cerebral ischemia: Secondary | ICD-10-CM | POA: Diagnosis not present

## 2016-05-06 DIAGNOSIS — G308 Other Alzheimer's disease: Secondary | ICD-10-CM | POA: Diagnosis not present

## 2016-05-06 DIAGNOSIS — Z66 Do not resuscitate: Secondary | ICD-10-CM | POA: Diagnosis not present

## 2016-05-06 DIAGNOSIS — F329 Major depressive disorder, single episode, unspecified: Secondary | ICD-10-CM | POA: Diagnosis not present

## 2016-05-06 DIAGNOSIS — Z7982 Long term (current) use of aspirin: Secondary | ICD-10-CM | POA: Insufficient documentation

## 2016-05-06 DIAGNOSIS — G252 Other specified forms of tremor: Secondary | ICD-10-CM | POA: Diagnosis not present

## 2016-05-06 DIAGNOSIS — M1990 Unspecified osteoarthritis, unspecified site: Secondary | ICD-10-CM | POA: Diagnosis not present

## 2016-05-06 DIAGNOSIS — R531 Weakness: Secondary | ICD-10-CM | POA: Diagnosis not present

## 2016-05-06 DIAGNOSIS — Z87440 Personal history of urinary (tract) infections: Secondary | ICD-10-CM | POA: Diagnosis not present

## 2016-05-06 DIAGNOSIS — E785 Hyperlipidemia, unspecified: Secondary | ICD-10-CM | POA: Insufficient documentation

## 2016-05-06 DIAGNOSIS — I10 Essential (primary) hypertension: Secondary | ICD-10-CM

## 2016-05-06 DIAGNOSIS — F0280 Dementia in other diseases classified elsewhere without behavioral disturbance: Secondary | ICD-10-CM | POA: Insufficient documentation

## 2016-05-06 DIAGNOSIS — G3189 Other specified degenerative diseases of nervous system: Secondary | ICD-10-CM | POA: Diagnosis not present

## 2016-05-06 DIAGNOSIS — G309 Alzheimer's disease, unspecified: Secondary | ICD-10-CM | POA: Insufficient documentation

## 2016-05-06 DIAGNOSIS — R4020 Unspecified coma: Secondary | ICD-10-CM

## 2016-05-06 DIAGNOSIS — I440 Atrioventricular block, first degree: Secondary | ICD-10-CM | POA: Insufficient documentation

## 2016-05-06 DIAGNOSIS — I2510 Atherosclerotic heart disease of native coronary artery without angina pectoris: Secondary | ICD-10-CM

## 2016-05-06 DIAGNOSIS — R569 Unspecified convulsions: Secondary | ICD-10-CM

## 2016-05-06 DIAGNOSIS — F0281 Dementia in other diseases classified elsewhere with behavioral disturbance: Secondary | ICD-10-CM

## 2016-05-06 DIAGNOSIS — R55 Syncope and collapse: Principal | ICD-10-CM

## 2016-05-06 DIAGNOSIS — R404 Transient alteration of awareness: Secondary | ICD-10-CM | POA: Diagnosis not present

## 2016-05-06 DIAGNOSIS — Z90710 Acquired absence of both cervix and uterus: Secondary | ICD-10-CM | POA: Insufficient documentation

## 2016-05-06 DIAGNOSIS — F068 Other specified mental disorders due to known physiological condition: Secondary | ICD-10-CM | POA: Diagnosis present

## 2016-05-06 LAB — BASIC METABOLIC PANEL
Anion gap: 6 (ref 5–15)
BUN: 11 mg/dL (ref 6–20)
CO2: 28 mmol/L (ref 22–32)
Calcium: 9.2 mg/dL (ref 8.9–10.3)
Chloride: 107 mmol/L (ref 101–111)
Creatinine, Ser: 0.68 mg/dL (ref 0.44–1.00)
GFR calc Af Amer: >60 mL/min (ref >60)
GFR calc non Af Amer: >60 mL/min (ref >60)
Glucose, Bld: 108 mg/dL — ABNORMAL HIGH (ref 65–99)
Potassium: 3.8 mmol/L (ref 3.5–5.1)
Sodium: 141 mmol/L (ref 135–145)

## 2016-05-06 LAB — CBC WITH DIFFERENTIAL/PLATELET
Basophils Absolute: 0 10*3/uL (ref 0.0–0.1)
Basophils Relative: 0 %
Eosinophils Absolute: 0 10*3/uL (ref 0.0–0.7)
Eosinophils Relative: 0 %
HCT: 41.7 % (ref 36.0–46.0)
Hemoglobin: 14.6 g/dL (ref 12.0–15.0)
Lymphocytes Relative: 36 %
Lymphs Abs: 1.6 10*3/uL (ref 0.7–4.0)
MCH: 31.2 pg (ref 26.0–34.0)
MCHC: 35 g/dL (ref 30.0–36.0)
MCV: 89.1 fL (ref 78.0–100.0)
Monocytes Absolute: 0.4 10*3/uL (ref 0.1–1.0)
Monocytes Relative: 10 %
Neutro Abs: 2.4 10*3/uL (ref 1.7–7.7)
Neutrophils Relative %: 54 %
Platelets: 174 10*3/uL (ref 150–400)
RBC: 4.68 MIL/uL (ref 3.87–5.11)
RDW: 12.4 % (ref 11.5–15.5)
WBC: 4.4 10*3/uL (ref 4.0–10.5)

## 2016-05-06 LAB — TROPONIN I

## 2016-05-06 MED ORDER — GADOBENATE DIMEGLUMINE 529 MG/ML IV SOLN
18.0000 mL | Freq: Once | INTRAVENOUS | Status: AC | PRN
  Administered 2016-05-06: 18 mL via INTRAVENOUS

## 2016-05-06 MED ORDER — DOCUSATE SODIUM 100 MG PO CAPS
200.0000 mg | ORAL_CAPSULE | Freq: Every day | ORAL | Status: DC
  Administered 2016-05-07 – 2016-05-08 (×2): 200 mg via ORAL
  Filled 2016-05-06 (×2): qty 2

## 2016-05-06 MED ORDER — UBIQUINOL 100 MG PO CAPS: 100.0000 mg | ORAL_CAPSULE | Freq: Every day | ORAL | Status: DC

## 2016-05-06 MED ORDER — SODIUM CHLORIDE 0.9% FLUSH
3.0000 mL | Freq: Two times a day (BID) | INTRAVENOUS | Status: DC
  Administered 2016-05-06 – 2016-05-08 (×3): 3 mL via INTRAVENOUS

## 2016-05-06 MED ORDER — CYANOCOBALAMIN 250 MCG PO TABS
500.0000 ug | ORAL_TABLET | Freq: Every day | ORAL | Status: DC
  Administered 2016-05-07 – 2016-05-08 (×2): 500 ug via ORAL
  Filled 2016-05-06 (×3): qty 2

## 2016-05-06 MED ORDER — AMLODIPINE BESYLATE 5 MG PO TABS
2.5000 mg | ORAL_TABLET | Freq: Every day | ORAL | Status: DC
  Administered 2016-05-07: 2.5 mg via ORAL
  Filled 2016-05-06: qty 1

## 2016-05-06 MED ORDER — ASPIRIN EC 81 MG PO TBEC
81.0000 mg | DELAYED_RELEASE_TABLET | Freq: Every day | ORAL | Status: DC
  Administered 2016-05-07 – 2016-05-08 (×2): 81 mg via ORAL
  Filled 2016-05-06 (×2): qty 1

## 2016-05-06 MED ORDER — QUETIAPINE FUMARATE 25 MG PO TABS
25.0000 mg | ORAL_TABLET | Freq: Every day | ORAL | Status: DC
  Administered 2016-05-07 – 2016-05-08 (×2): 25 mg via ORAL
  Filled 2016-05-06 (×2): qty 1

## 2016-05-06 NOTE — H&P (Signed)
History and Physical:    Valerie Chen   [id number] DOB: 1928/07/08 DOA: 05/06/2016  Referring MD/provider: Virgel Manifold, MD PCP: Monico Blitz, MD   Patient coming from: Home.  Chief Complaint: Altered mental status with loss of consciousness and tremulous/shaking on awakening.  History of Present Illness:   Valerie Chen is an 82 y.o. female the PMH of Alzheimer's dementia, coronary artery disease (not felt to be a candidate for intervention), history of TIA, hypertension and hyperlipidemia who was brought to the hospital for evaluation of loss of consciousness that lasted approximately 10 minutes. Upon coming to, the patient developed bilateral upper extremity shaking/tremulousness. The family had provided a video of this event, and the patient is clearly observed to be conversing with her caregiver while shaking bilaterally. Prior to losing consciousness, the patient had been ambulating across the room. She had a similar episode last week where she was talking normally when she suddenly verbalized words that did not make sense. There was no loss of bladder or bowel continence. Patient has not had any recent medication changes. Records of her prior hospitalization were reviewed. She was evaluated in the hospital 12/30/15 for what was suspected to be a syncopal event as well. This was preceded by shaking of the left upper extremity followed by loss of consciousness. Because the episode happened when she was on the toilet having a bowel movement, the working diagnosis was vasovagal syncope. A CT of the head was negative at that time. Telemetry monitoring did not reveal any arrhythmias and orthostatic vital signs were negative. A 2-D echo was done as well which showed an EF of 55-60 percent with grade 1 diastolic dysfunction and no wall motion abnormalities. Patient's daughter states that she was planning to have an outpatient EEG done to evaluate her for the possibility of seizures. The family  reports that she has had a total of 3 of these episodes, one in which she stopped breathing.  ED Course:  The patient had a CT of her head done on 05/03/16 which was unremarkable except for mild age-related nonspecific white matter changes.  ROS:   Review of Systems  Unable to perform ROS: Dementia    Past Medical History:   Past Medical History:  Diagnosis Date  . Alzheimer disease   . Arthritis   . CAD (coronary artery disease)    Probable CAD, nuclear February, 2012, possible anteroseptal and inferoseptal ischemia but the study was technically difficult  . Chest pain    Hospital February, 2012, nuclear, possible anteroseptal and inferoseptal ischemia, technically difficult, medical therapy  . Dementia    Significant  . Diastolic dysfunction    Echo, February, 2012  . Diastolic dysfunction   . DNR (do not resuscitate)   . Dyslipidemia   . Edema    hospitalizations September, 2013, some fluid overload  . Ejection fraction    EF 55%, echo, February, 2012  //   EF 55-60%, echo, February 25, 2012  . Essential hypertension   . GI bleed    February, 2012 incomplete colonoscopy, hemorrhoids, needs complete colonoscopy  . Hx: UTI (urinary tract infection)   . Hyperlipidemia   . Leg pain, bilateral   . Major depression    2 overdoses in 1975 & 1977  . Migraines    25 year  . Orthostatic hypotension   . Suicide attempt 1985   2 attempts with Valium  . TIA (transient ischemic attack)       ???question of a TIA in February,  2012, carotid Doppler, August 11, 2010 no significant abnormalities    Past Surgical History:   Past Surgical History:  Procedure Laterality Date  . ABDOMINAL HYSTERECTOMY    . DIAGNOSTIC MAMMOGRAM  12/29/2000   Mammogram, suspicious lesions -BX pending  . TONSILLECTOMY      Social History:   Social History   Social History  . Marital status: Widowed    Spouse name: N/A  . Number of children: 3  . Years of education: HS   Occupational  History  . RETIRED    Social History Main Topics  . Smoking status: Never Smoker  . Smokeless tobacco: Never Used  . Alcohol use No  . Drug use: No  . Sexual activity: No   Other Topics Concern  . Not on file   Social History Narrative   Has 24/7 caregiver.      2 husbands   3 children      Diet: Heart Health   Caffeine- yes, tea   Married- widow   House- 1 stories, 2 persons   Pets- no, puppy died 1 1/2 years ago   Current/Past profession- Retired Engineer, manufacturing systems   Exercise- Yes, walking   Living Will-Yes   DNR- Yes   POA/HPOA- Yes   Right-handed.       Allergies   Levaquin [levofloxacin]  Family history:   Family History  Problem Relation Age of Onset  . Heart attack Father     several heart attacks  . Cancer Father     bone  . Arthritis Father   . Suicidality Mother   . Depression Mother   . Heart attack Brother   . Heart disease Brother   . Pneumonia Brother   . Suicidality Son   . Hypertension Daughter   . Depression Other     Most family members    Current Medications:   Prior to Admission medications   Medication Sig Start Date End Date Taking? Authorizing Provider  amLODipine (NORVASC) 5 MG tablet Take 2.5 mg by mouth daily.    Yes Historical Provider, MD  aspirin EC 81 MG tablet Take 81 mg by mouth daily.    Yes Historical Provider, MD  Cranberry-Vitamin C-Vitamin E (CRANBERRY SUPER STRENGTH PO) Take 2 capsules by mouth 3 (three) times daily after meals.   Yes Historical Provider, MD  cyanocobalamin 500 MCG tablet Take 500 mcg by mouth daily.    Yes Historical Provider, MD  docusate sodium (COLACE) 100 MG capsule Take 200 mg by mouth daily.   Yes Historical Provider, MD  furosemide (LASIX) 40 MG tablet Take 20 mg by mouth daily.   Yes Historical Provider, MD  OVER THE COUNTER MEDICATION Take 1 tablet by mouth 3 (three) times daily after meals. Medication:  Curamin   Yes Historical Provider, MD  OVER THE COUNTER MEDICATION Take 1 tablet by mouth  daily. Medication:  Solgar V   Yes Historical Provider, MD  potassium chloride SA (K-DUR,KLOR-CON) 20 MEQ tablet Take 20 mEq by mouth daily.   Yes Historical Provider, MD  QUEtiapine (SEROQUEL) 25 MG tablet Take 25 mg by mouth daily.    Yes Historical Provider, MD  Ubiquinol 100 MG CAPS Take 100 mg by mouth daily.    Yes Historical Provider, MD    Physical Exam:   Vitals:   05/06/16 1156 05/06/16 1201 05/06/16 1404  BP: 135/79 139/78 132/82  Pulse:  69 63  Resp: 18 25 18   Temp:  97.6 F (36.4 C)   TempSrc:  Oral   SpO2: 94% 96% 96%     Physical Exam: Blood pressure 132/82, pulse 63, temperature 97.6 F (36.4 C), temperature source Oral, resp. rate 18, SpO2 96 %. Gen: No acute distress. Head: Normocephalic, atraumatic. Eyes: Pupils equal, round and reactive to light. Extraocular movements intact.  Sclerae nonicteric. No lid lag. Mouth: Oropharynx reveals Mildly dry mucous membranes. Dentition is poor with multiple missing teeth. Neck: Supple, no thyromegaly, no lymphadenopathy, no jugular venous distention. Chest: Lungs are clear to auscultation with good air movement. No rales, rhonchi or wheezes.  CV: Heart sounds are regular with an S1, S2. No murmurs, rubs, clicks, or gallops.  Abdomen: Soft, nontender, nondistended with normal active bowel sounds. No hepatosplenomegaly or palpable masses. Extremities: Extremities are without clubbing, edema, or cyanosis. Pedal pulses 2+.  Skin: Warm and dry. No rashes, lesions or wounds. Neuro: Disoriented; grossly nonfocal. Does not appear to be post ictal. Psych: Insight is good and judgment is impaired. Mood and affect normal.   Data Review:    Labs: Basic Metabolic Panel:  Recent Labs Lab 05/03/16 1010 05/06/16 1339  NA 139 141  K 3.6 3.8  CL 105 107  CO2 26 28  GLUCOSE 110* 108*  BUN 12 11  CREATININE 0.88 0.68  CALCIUM 9.2 9.2   Liver Function Tests:  Recent Labs Lab 05/03/16 1010  AST 23  ALT 21  ALKPHOS 59    BILITOT 0.9  PROT 5.9*  ALBUMIN 3.7   CBC:  Recent Labs Lab 05/03/16 1010 05/06/16 1339  WBC 5.2 4.4  NEUTROABS 3.8 2.4  HGB 15.2* 14.6  HCT 44.9 41.7  MCV 91.3 89.1  PLT 159 174    Urinalysis    Component Value Date/Time   COLORURINE YELLOW 05/03/2016 1107   APPEARANCEUR CLEAR 05/03/2016 1107   LABSPEC 1.020 05/03/2016 1107   PHURINE 6.0 05/03/2016 1107   GLUCOSEU NEGATIVE 05/03/2016 1107   HGBUR NEGATIVE 05/03/2016 1107   BILIRUBINUR NEGATIVE 05/03/2016 1107   KETONESUR NEGATIVE 05/03/2016 1107   PROTEINUR NEGATIVE 05/03/2016 1107   UROBILINOGEN 0.2 03/01/2015 0930   NITRITE NEGATIVE 05/03/2016 1107   LEUKOCYTESUR NEGATIVE 05/03/2016 1107      Radiographic Studies: No results found.  EKG: Independently reviewed. Sinus rhythm at 59 bpm with prolonged PR interval.   Assessment/Plan:   Principal Problem:   Syncope The patient's symptoms seem to be the patient's symptoms seem to be related to syncope however seizures or cardiac arrhythmias are also in the differential. The patient has a known history of coronary artery disease but recent 2-D echocardiography was relatively normal. I suspect an underlying arrhythmic event is causing decreased cardiac output/syncope and neurological symptoms. The video of her shaking spell is not consistent with seizure activity given that it is bilateral with the patient conversant during the event. Nonetheless, we'll check an EEG. Would also check an MRI/MRA of the brain and carotid Dopplers to see if she has any flow limiting lesions which would place her at risk for neurological events. Continue aspirin. Cycle cardiac markers. Monitor on telemetry. Check orthostatics.  Active Problems:   Dementia Continue Seroquel.    CAD (coronary artery disease) Continue aspirin. Cycle troponins.    Essential hypertension Hold Lasix until orthostatic vital signs can be ascertained. Continue Norvasc.    Coarse tremors Possibly  manifestation of anxiety. No evidence of infection to suggest rigors. Check EEG.   Other information:   DVT prophylaxis: SCDs ordered. Code Status: Full code. Family Communication: Daughter and caregiver at  the bedside to stop Disposition Plan: Home in 24-48 hours. Consults called: None. Admission status: Observation.  The medical decision making on this patient was of high complexity with review of multiple data points.   Dorene Bruni [facility] Pager [phone] Cell: [phone]   If 7PM-7AM, please contact night-coverage [email or web address] Password [email or web address] 05/06/2016, 4:18 PM

## 2016-05-06 NOTE — Progress Notes (Signed)
PHARMACIST - PHYSICIAN ORDER COMMUNICATION  CONCERNING: P&T Medication Policy on Herbal Medications  DESCRIPTION:  This patient's order for:  Ubiquinol  has been noted.  This product(s) is classified as an "herbal" or natural product. Due to a lack of definitive safety studies or FDA approval, nonstandard manufacturing practices, plus the potential risk of unknown drug-drug interactions while on inpatient medications, the Pharmacy and Therapeutics Committee does not permit the use of "herbal" or natural products of this type within [facility].   ACTION TAKEN: The pharmacy department is unable to verify this order at this time and your patient has been informed of this safety policy. Please reevaluate patient's clinical condition at discharge and address if the herbal or natural product(s) should be resumed at that time.  Dia Sitter, PharmD, BCPS 05/06/2016 5:42 PM

## 2016-05-06 NOTE — ED Triage Notes (Signed)
Family reports 10 mins of unconsciousness and upper extremities tremors. Usually can carry on a full conversations but just nodes her head and speak one word answers now. Monday was [facility] for [facility].

## 2016-05-06 NOTE — ED Provider Notes (Signed)
[facility] DEPT Provider Note    By signing my name below, I, Bea Graff, attest that this documentation has been prepared under the direction and in the presence of Virgel Manifold, MD. Electronically Signed: Bea Graff, ED Scribe. 05/06/16. 12:52 PM.   History   Chief Complaint No chief complaint on file.  The history is provided by the patient and medical records. No language interpreter was used.    LEVEL 5 CAVEAT- Full history could not be obtained due to dementia.  HPI Comments:  Valerie Chen is a 82 y.o. female with PMHx of Alzheimer's disease, dementia, CAD, TIA, HTN and HLD who presents to the Emergency Department complaining of LOC PTA that lasted about 10 minutes. Her daughter states she was at [facility] ED four days ago for an LOC episode but states she stopped breathing. Her caregiver states she was walking across the room and started "going down" today before the LOC. CNA denies the pt stopped breathing this time. She states her vitals remained WNL, except her O2 sats were 91%, while she was unconscious. She reports the pt's hands started trembling. Pt told her daughter that her legs do not feel like they are part of her body. Her daughter states pt reported feeling like she was "hit by a bus" for the past couple days. CNA reports the pt experienced an episode last week where she was talking normally when all of a sudden sound was coming out but nothing made sense. Daughter reports she was having difficulty getting words out prior to EMS arriving today. Daughter and CNA deny bowel or bladder incontinence or diarrhea. Daughter denies any recent medication changes. Pt had a flu vaccination five days ago. Pt denies any pain.   Past Medical History:  Diagnosis Date  . Alzheimer disease   . Arthritis   . CAD (coronary artery disease)    Probable CAD, nuclear February, 2012, possible anteroseptal and inferoseptal ischemia but the study was technically difficult  . Chest  pain    Hospital February, 2012, nuclear, possible anteroseptal and inferoseptal ischemia, technically difficult, medical therapy  . Dementia    Significant  . Diastolic dysfunction    Echo, February, 2012  . Diastolic dysfunction   . DNR (do not resuscitate)   . Dyslipidemia   . Edema    hospitalizations September, 2013, some fluid overload  . Ejection fraction    EF 55%, echo, February, 2012  //   EF 55-60%, echo, February 25, 2012  . Essential hypertension   . GI bleed    February, 2012 incomplete colonoscopy, hemorrhoids, needs complete colonoscopy  . Hx: UTI (urinary tract infection)   . Hyperlipidemia   . Leg pain, bilateral   . Major depression    2 overdoses in 1975 & 1977  . Migraines    25 year  . Orthostatic hypotension   . Suicide attempt 1985   2 attempts with Valium  . TIA (transient ischemic attack)       ???question of a TIA in February, 2012, carotid Doppler, August 11, 2010 no significant abnormalities    Patient Active Problem List   Diagnosis Date Noted  . Pain in joint, lower leg 03/15/2016  . Syncope 12/29/2015  . UTI (urinary tract infection) 12/29/2015  . HCAP (healthcare-associated pneumonia) 02/01/2015  . Hypoxia 02/01/2015  . Recurrent UTI 02/01/2015  . Neck pain   . Essential hypertension 05/30/2013  . Ejection fraction   . Edema   . Lower extremity edema 02/25/2012  .  CAD (coronary artery disease)   . Chest pain   . Orthostatic hypotension   . Migraines   . Dementia   . Diastolic dysfunction   . TIA (transient ischemic attack)   . GI bleed   . Dyslipidemia     Past Surgical History:  Procedure Laterality Date  . ABDOMINAL HYSTERECTOMY    . DIAGNOSTIC MAMMOGRAM  12/29/2000   Mammogram, suspicious lesions -BX pending  . TONSILLECTOMY      OB History    No data available       Home Medications    Prior to Admission medications   Medication Sig Start Date End Date Taking? Authorizing Provider  amLODipine (NORVASC) 5 MG  tablet Take 2.5 mg by mouth every morning.     Historical Provider, MD  aspirin EC 81 MG tablet Take 81 mg by mouth every morning.    Historical Provider, MD  CRANBERRY EXTRACT PO Take 4,200 mg by mouth 3 (three) times daily after meals.     Historical Provider, MD  cyanocobalamin 500 MCG tablet Take 500 mcg by mouth every morning.     Historical Provider, MD  docusate sodium (COLACE) 100 MG capsule Take 2 capsules (200 mg total) by mouth 2 (two) times daily as needed for mild constipation. Patient taking differently: Take 200 mg by mouth daily.  02/05/15   Florencia Reasons, MD  furosemide (LASIX) 40 MG tablet Take 1 tablet (40 mg total) by mouth every morning. HOLD for next 4 days Patient taking differently: Take 20 mg by mouth every morning. HOLD for next 4 days 12/30/15   Ripudeep Krystal Eaton, MD  LORazepam (ATIVAN) 0.5 MG tablet Take 0.125 mg by mouth daily as needed for anxiety.  11/27/15   Historical Provider, MD  mirabegron ER (MYRBETRIQ) 25 MG TB24 tablet Take 1 tablet (25 mg total) by mouth daily. Take for 1 week, then have revaluation by MD Patient not taking: Reported on 05/03/2016 12/30/15   Ripudeep Krystal Eaton, MD  OVER THE COUNTER MEDICATION Take 1 tablet by mouth 3 (three) times daily with meals. Curamin    Historical Provider, MD  potassium chloride SA (K-DUR,KLOR-CON) 20 MEQ tablet Take 2 tablets (40 mEq total) by mouth every morning. Hold for 4 days Patient taking differently: Take 20 mEq by mouth every morning.  12/30/15   Ripudeep Krystal Eaton, MD  QUEtiapine (SEROQUEL) 25 MG tablet Take 25 mg by mouth daily. @900  in the morning.    Historical Provider, MD  Ubiquinol 100 MG CAPS Take by mouth daily.    Historical Provider, MD  UNABLE TO FIND Solgar V - takes vitamin daily.    Historical Provider, MD    Family History Family History  Problem Relation Age of Onset  . Heart attack Father     several heart attacks  . Cancer Father     bone  . Arthritis Father   . Suicidality Mother   . Depression Mother     . Heart attack Brother   . Heart disease Brother   . Pneumonia Brother   . Suicidality Son   . Hypertension Daughter   . Depression Other     Most family members    Social History Social History  Substance Use Topics  . Smoking status: Never Smoker  . Smokeless tobacco: Never Used  . Alcohol use No     Allergies   Levaquin [levofloxacin]  LEVEL 5 CAVEAT- Full history could not be obtained due to dementia.  Review of Systems  Review of Systems  Unable to perform ROS: Dementia   Physical Exam Updated Vital Signs BP 139/78 (BP Location: Right Arm)   Pulse 69   Temp 97.6 F (36.4 C) (Oral)   Resp 25   SpO2 96%   Physical Exam  Constitutional: She appears well-developed and well-nourished. No distress.  HENT:  Head: Normocephalic and atraumatic.  Eyes: EOM are normal.  Neck: Normal range of motion.  Cardiovascular: Normal rate, regular rhythm and normal heart sounds.   Pulmonary/Chest: Effort normal and breath sounds normal.  Abdominal: Soft. She exhibits no distension. There is no tenderness.  Musculoskeletal: Normal range of motion.  Neurological: She is alert. She displays normal reflexes. No cranial nerve deficit or sensory deficit. She exhibits normal muscle tone. Coordination normal.  Oriented to self and place.  Skin: Skin is warm and dry.  Nursing note and vitals reviewed.    ED Treatments / Results  DIAGNOSTIC STUDIES: Oxygen Saturation is 96% on RA, normal by my interpretation.   COORDINATION OF CARE: 12:48 PM- Will consult neurology. Pt verbalizes understanding and agrees to plan.  Medications - No data to display  Labs (all labs ordered are listed, but only abnormal results are displayed) Labs Reviewed  BASIC METABOLIC PANEL - Abnormal; Notable for the following:       Result Value   Glucose, Bld 108 (*)    All other components within normal limits  CBC WITH DIFFERENTIAL/PLATELET  TROPONIN I  TROPONIN I  TROPONIN I    EKG  EKG  Interpretation  Date/Time:  Thursday May 06 2016 14:06:51 EST Ventricular Rate:  59 PR Interval:    QRS Duration: 87 QT Interval:  421 QTC Calculation: 417 R Axis:   57 Text Interpretation:  Sinus rhythm Borderline prolonged PR interval Low voltage, precordial leads Confirmed by Wilson Singer  MD, Ryer Asato [id number]) on 05/06/2016 3:30:44 PM       Radiology No results found.  Procedures Procedures (including critical care time)  Medications Ordered in ED Medications - No data to display   Initial Impression / Assessment and Plan / ED Course  I have reviewed the triage vital signs and the nursing notes.  Pertinent labs & imaging results that were available during my care of the patient were reviewed by me and considered in my medical decision making (see chart for details).  Clinical Course     87yF with repeated episodes of unresponsiveness. Recently seen in the ED for the same. I suspect these may be seizures based on caretaker/family description and what sound like post-ictal state. Currently back to baseline. Just had CT head on ED visit a couple days ago.   I personally preformed the services scribed in my presence. The recorded information has been reviewed is accurate. Virgel Manifold, MD.   Final Clinical Impressions(s) / ED Diagnoses   Final diagnoses:  New onset seizure [facility])    New Prescriptions New Prescriptions   No medications on file     Virgel Manifold, MD 05/08/16 1120

## 2016-05-07 ENCOUNTER — Observation Stay (HOSPITAL_BASED_OUTPATIENT_CLINIC_OR_DEPARTMENT_OTHER)
Admit: 2016-05-07 | Discharge: 2016-05-07 | Disposition: A | Discharge status: Home / Self Care | Payer: Medicare Other | Attending: Internal Medicine | Admitting: Internal Medicine

## 2016-05-07 ENCOUNTER — Observation Stay (HOSPITAL_BASED_OUTPATIENT_CLINIC_OR_DEPARTMENT_OTHER): Payer: Medicare Other

## 2016-05-07 DIAGNOSIS — I2510 Atherosclerotic heart disease of native coronary artery without angina pectoris: Secondary | ICD-10-CM | POA: Diagnosis not present

## 2016-05-07 DIAGNOSIS — R55 Syncope and collapse: Secondary | ICD-10-CM

## 2016-05-07 DIAGNOSIS — I10 Essential (primary) hypertension: Secondary | ICD-10-CM | POA: Diagnosis not present

## 2016-05-07 DIAGNOSIS — G308 Other Alzheimer's disease: Secondary | ICD-10-CM | POA: Diagnosis not present

## 2016-05-07 LAB — VAS US CAROTID
LCCADDIAS: -4 cm/s
LCCADSYS: -90 cm/s
LCCAPDIAS: 6 cm/s
LCCAPSYS: 100 cm/s
LEFT ECA DIAS: 0 cm/s
LEFT VERTEBRAL DIAS: -5 cm/s
LICADDIAS: -7 cm/s
LICADSYS: -79 cm/s
LICAPDIAS: -6 cm/s
LICAPSYS: -53 cm/s
RCCAPSYS: 90 cm/s
RIGHT ECA DIAS: -2 cm/s
RIGHT VERTEBRAL DIAS: -2 cm/s
Right CCA prox dias: 6 cm/s
Right cca dist sys: -68 cm/s

## 2016-05-07 LAB — TROPONIN I

## 2016-05-07 MED ORDER — POTASSIUM CHLORIDE CRYS ER 20 MEQ PO TBCR
20.0000 meq | EXTENDED_RELEASE_TABLET | Freq: Every day | ORAL | Status: DC
  Administered 2016-05-07: 20 meq via ORAL
  Filled 2016-05-07: qty 1

## 2016-05-07 MED ORDER — FUROSEMIDE 20 MG PO TABS
20.0000 mg | ORAL_TABLET | Freq: Every day | ORAL | Status: DC
  Administered 2016-05-07: 20 mg via ORAL
  Filled 2016-05-07: qty 1

## 2016-05-07 MED ORDER — ACETAMINOPHEN 325 MG PO TABS
650.0000 mg | ORAL_TABLET | ORAL | Status: DC | PRN
  Administered 2016-05-07: 650 mg via ORAL
  Filled 2016-05-07: qty 2

## 2016-05-07 NOTE — Progress Notes (Signed)
VASCULAR LAB PRELIMINARY  PRELIMINARY  PRELIMINARY  PRELIMINARY  Carotid duplex completed.    Preliminary report:  Bilateral - No evidence of extracranial ICA stenosis. Vertebral artery flow is antegrade.  Brave Dack, RVS 05/07/2016, 3:17 PM

## 2016-05-07 NOTE — Progress Notes (Signed)
EEG Completed; Results Pending  

## 2016-05-07 NOTE — Procedures (Signed)
ELECTROENCEPHALOGRAM REPORT  Date of Study: 05/07/2016  Patient's Name: Valerie Chen MRN: [id number] Date of Birth: 04-29-1929  Referring Provider: Dr. Margreta Journey Rama  Clinical History: This is an 82 year old woman with an episode of loss of consciousness.   Medications: acetaminophen (TYLENOL) tablet 650 mg  aspirin EC tablet 81 mg  docusate sodium (COLACE) capsule 200 mg  furosemide (LASIX) tablet 20 mg  potassium chloride SA (K-DUR,KLOR-CON) CR tablet 20 mEq  QUEtiapine (SEROQUEL) tablet 25 mg  sodium chloride flush (NS) 0.9 % injection 3 mL  vitamin B-12 (CYANOCOBALAMIN) tablet 500 mcg   Technical Summary: A multichannel digital EEG recording measured by the international 10-20 system with electrodes applied with paste and impedances below 5000 ohms performed in our laboratory with EKG monitoring in an awake and asleep patient.  Hyperventilation and photic stimulation were not performed.  The digital EEG was referentially recorded, reformatted, and digitally filtered in a variety of bipolar and referential montages for optimal display.    Description: The patient is awake and asleep during the recording.  During maximal wakefulness, there is a symmetric, medium voltage 8.5 Hz posterior dominant rhythm that attenuates with eye opening.  The record is symmetric.  During drowsiness and sleep, there is an increase in theta slowing of the background, with shifting asymmetry over the bilateral temporal regions.  Vertex waves and symmetric sleep spindles were seen.  Hyperventilation and photic stimulation were not performed.  There were no epileptiform discharges or electrographic seizures seen.    EKG lead showed sinus bradycardia.  Impression: This awake and asleep EEG is normal.    Clinical Correlation: A normal EEG does not exclude a clinical diagnosis of epilepsy. Clinical correlation is advised.   Ellouise Newer, M.D.

## 2016-05-07 NOTE — Care Management Note (Signed)
Case Management Note  Patient Details  Name: Valerie Chen MRN: [id number] Date of Birth: May 04, 1929  Subjective/Objective:   Pt admitted with Syncope. Pt live at home with 24/7 care.                  Action/Plan: Plan to dc home with caregivers 24/7, follow up with PCP.    Expected Discharge Date:  05/07/2016               Expected Discharge Plan:     In-House Referral:     Discharge planning Services     Post Acute Care Choice:    Choice offered to:     DME Arranged:    DME Agency:     HH Arranged:    HH Agency:     Status of Service:     If discussed at [employer], dates discussed:    Additional CommentsPurcell Mouton, RN 05/07/2016, 10:46 AM

## 2016-05-07 NOTE — Discharge Summary (Signed)
Physician Discharge Summary  Valerie Chen [id number] DOB: 02-16-1929 DOA: 05/06/2016  PCP: Monico Blitz, MD  Admit date: 05/06/2016 Discharge date: 05/08/2016  Admitted From: Home Disposition: Home   Recommendations for Outpatient Follow-up:  1. Follow up with cardiology within 1 week 2. Monitor BP and HR: norvasc stopped and advised to hold lasix/potassium unless weight increasing.   Home Health: Resume 24 hours care with Aide and daughter Equipment/Devices: None new Discharge Condition: Stable CODE STATUS: DNR Diet recommendation: Heart healthy  Brief/Interim Summary:  Valerie Chen is a 82 y.o. female with alzheimer's dementia, CAD, h/o TIA, HTN, HLD brought 11/16 for syncope and AMS. She's had multiple episodes of sudden LOC and collapse, sometimes remaining altered for 10-15 minutes afterward. Some shaking was witnessed, no incontinence, and she was verbally responsive during shaking. She's been seen in the ED 3 times in the past few months for similar presentation, presumed to be orthostatic or vasovagal syncope. She's been consistently hypotensive, so anti-HTN medications have been decreased. Telemetry has not been showing arrhythmia. On arrival patient was at mental baseline, CT reviewed from PTA 11/13 showed age-related white matter changes. She was admitted for syncope evaluation. MRI/MRA and carotid dopplers have been noncontributory. EEG 11/17 was normal. She was being prepared for discharge when she turned grey, felt extremely light-headed, and was found to have BP 70/30 improved when laying. She had received norvasc and lasix. She will be kept overnight for further observation, holding norvasc and lasix. Plan to follow up with cardiology as an outpatient.   Discharge Diagnoses:  Principal Problem:   Syncope Active Problems:   Dementia   CAD (coronary artery disease)   Essential hypertension   Coarse tremors  Syncope: Arrhythmia vs. iatrogenic hypotension as  etiology. No evidence of occlusion/stenosis on carotid dopplers, MRI/MRA. EEG confirms suspicion that this is not epileptic etiology.  - Bradycardia present but likely not PPM candidate: recommend outpatient follow up with cardiology within 1 week.  - BPs have improved and no further episodes have occurred.  Dementia: Chronic, stable.  - Continue seroquel   CAD: Not thought to be candidate for interventions.  - Continue aspirin. beta blocker and ACE inhibitor stopped PTA due to hypotension.  - No ACS, troponins negative.   Essential hypertension:  - Holding lasix and norvasc. While not orthostatic, she does continue to have low blood pressures likely contributing to symptoms causing presentation.   Coarse tremors: Nonepilectic clinically and negative EEG. Likely related to anxiety. - Monitor  Discharge Instructions Discharge Instructions    (HEART FAILURE PATIENTS) Call MD:  Anytime you have any of the following symptoms: 1) 3 pound weight gain in 24 hours or 5 pounds in 1 week 2) shortness of breath, with or without a dry hacking cough 3) swelling in the hands, feet or stomach 4) if you have to sleep on extra pillows at night in order to breathe.    Complete by:  As directed    Discharge instructions    Complete by:  As directed    Valerie Chen was admitted 11/16 for observation after having an episode of syncope (losing consciousness). This was not due to a neurological problem, as advanced head imaging and EEG have ruled out masses, stroke, seizures. It is more likely related to low blood pressure. Recommendations after discharge:  - STOP taking amlodipine  - STOP taking ubiquinol (CoQ10) - Weigh daily and give lasix (with potassium) if weight increases. Do not take lasix or potassium if weight is stable. - Continue all  other medications - Follow up as soon as possible with Dr. Ron Parker (cardiology). Call to schedule this appointment.  - Return for medical care if symptoms return.    Increase activity slowly    Complete by:  As directed        Medication List    STOP taking these medications   amLODipine 5 MG tablet Commonly known as:  NORVASC   Ubiquinol 100 MG Caps     TAKE these medications   aspirin EC 81 MG tablet Take 81 mg by mouth daily.   CRANBERRY SUPER STRENGTH PO Take 2 capsules by mouth 3 (three) times daily after meals.   cyanocobalamin 500 MCG tablet Take 500 mcg by mouth daily.   docusate sodium 100 MG capsule Commonly known as:  COLACE Take 200 mg by mouth daily.   furosemide 40 MG tablet Commonly known as:  LASIX Take 20 mg by mouth daily.   OVER THE COUNTER MEDICATION Take 1 tablet by mouth 3 (three) times daily after meals. Medication:  Curamin   OVER THE COUNTER MEDICATION Take 1 tablet by mouth daily. Medication:  Solgar V   potassium chloride SA 20 MEQ tablet Commonly known as:  K-DUR,KLOR-CON Take 20 mEq by mouth daily.   QUEtiapine 25 MG tablet Commonly known as:  SEROQUEL Take 25 mg by mouth daily.      Follow-up Information    SHAH,ASHISH, MD. Schedule an appointment as soon as possible for a visit on 05/10/2016.   Specialty:  Internal Medicine Contact information: [place] [address] [phone]        Dola Argyle, MD Follow up.   Specialty:  Cardiology Contact information: [id number] [place] [address] [phone]          Allergies  Allergen Reactions  . Levaquin [Levofloxacin] Other (See Comments)    Reaction:  Altered mental status     Consultations:  None  Procedures/Studies: Ct Head Wo Contrast  Result Date: 05/03/2016 CLINICAL DATA:  82 year old female with altered mental status. Initial encounter. EXAM: CT HEAD WITHOUT CONTRAST TECHNIQUE: Contiguous axial images were obtained from the base of the skull through the vertex without intravenous contrast. COMPARISON:  Noncontrast head CT 12/29/2015 and earlier. FINDINGS: Brain: Mild for age  chronic cerebral white matter hypodensity appears stable. Otherwise Gray-white matter differentiation is within normal limits throughout the brain. No midline shift, ventriculomegaly, mass effect, evidence of mass lesion, intracranial hemorrhage or evidence of cortically based acute infarction. Vascular: No suspicious intracranial vascular hyperdensity. Skull: Stable visualized osseous structures. Chronic right TMJ degeneration. Sinuses/Orbits: Visualized paranasal sinuses and mastoids are stable and well pneumatized. Other: No acute orbit or scalp soft tissue finding. IMPRESSION: No acute intracranial abnormality. Stable mild for age nonspecific white matter changes. Electronically Signed   By: Genevie Ann M.D.   On: 05/03/2016 12:14   Mr Jodene Nam Head Wo Contrast  Result Date: 05/06/2016 CLINICAL DATA:  Initial evaluation for loss of consciousness. EXAM: MRI HEAD WITHOUT CONTRAST MRA HEAD WITHOUT CONTRAST TECHNIQUE: Multiplanar, multiecho pulse sequences of the brain and surrounding structures were obtained without intravenous contrast. Angiographic images of the head were obtained using MRA technique without contrast. COMPARISON:  Prior CT from 05/03/2016. FINDINGS: MRI HEAD FINDINGS Brain: Diffuse prominence of the CSF containing spaces is compatible with generalized cerebral atrophy. There is more pronounced atrophy involving the anterior temporal lobes bilaterally. Patchy and confluent T2/FLAIR hyperintensity within the periventricular and deep white matter both cerebral hemispheres most consistent with  chronic microvascular ischemic changes, moderate in nature. No abnormal foci of restricted diffusion to suggest acute or subacute ischemia. Gray-white matter differentiation maintained. No definite acute intracranial hemorrhage, although evaluation limited as gradient sequences unavailable. No mass lesion, midline shift, or mass effect. Mild ventricular prominence related to global parenchymal volume loss without  hydrocephalus. No extra-axial fluid collection. Major dural sinuses are grossly patent. No abnormal enhancement. Pituitary gland within normal limits. Vascular: Major intracranial vascular flow voids are maintained. Skull and upper cervical spine: Craniocervical junction normal. Visualized upper cervical spine unremarkable. Bone marrow signal intensity normal. No scalp soft tissue abnormality. Sinuses/Orbits: Globes and orbital soft tissues within normal limits. Paranasal sinuses are clear. Trace opacity right mastoid air cells. Inner ear structures normal. MRA HEAD FINDINGS ANTERIOR CIRCULATION: Distal cervical segments of the internal carotid arteries are patent with antegrade flow. Petrous, cavernous, and supraclinoid segments widely patent without flow limiting stenosis. Right ICA terminus somewhat ectatic. Dominant right A1 segment, which likely accounts for the slightly prominent right ICA is compared to the left. Left A1 segment patent as well. Anterior communicating artery normal. Anterior cerebral arteries patent to their distal aspects. M1 segments patent without stenosis or occlusion. MCA bifurcations normal. Distal MCA branches well opacified and symmetric. POSTERIOR CIRCULATION: The vertebral artery is patent to the vertebrobasilar junction. Posterior inferior cerebral arteries patent proximally. Basilar artery widely patent. Superior cerebellar and posterior cerebral arteries patent bilaterally. Prominent right posterior communicating artery noted. No aneurysm or vascular malformation. IMPRESSION: MRI HEAD IMPRESSION: 1. No acute intracranial process. 2. Temporal lobe predominant cerebral atrophy. 3. Moderate chronic microvascular ischemic disease. MRA HEAD IMPRESSION: Negative intracranial MRA. No large or proximal arterial branch occlusion. No high-grade or correctable stenosis. Electronically Signed   By: Jeannine Boga M.D.   On: 05/06/2016 23:17   Mr Jeri Cos [id number] Contrast  Result Date:  05/06/2016 CLINICAL DATA:  Initial evaluation for loss of consciousness. EXAM: MRI HEAD WITHOUT CONTRAST MRA HEAD WITHOUT CONTRAST TECHNIQUE: Multiplanar, multiecho pulse sequences of the brain and surrounding structures were obtained without intravenous contrast. Angiographic images of the head were obtained using MRA technique without contrast. COMPARISON:  Prior CT from 05/03/2016. FINDINGS: MRI HEAD FINDINGS Brain: Diffuse prominence of the CSF containing spaces is compatible with generalized cerebral atrophy. There is more pronounced atrophy involving the anterior temporal lobes bilaterally. Patchy and confluent T2/FLAIR hyperintensity within the periventricular and deep white matter both cerebral hemispheres most consistent with chronic microvascular ischemic changes, moderate in nature. No abnormal foci of restricted diffusion to suggest acute or subacute ischemia. Gray-white matter differentiation maintained. No definite acute intracranial hemorrhage, although evaluation limited as gradient sequences unavailable. No mass lesion, midline shift, or mass effect. Mild ventricular prominence related to global parenchymal volume loss without hydrocephalus. No extra-axial fluid collection. Major dural sinuses are grossly patent. No abnormal enhancement. Pituitary gland within normal limits. Vascular: Major intracranial vascular flow voids are maintained. Skull and upper cervical spine: Craniocervical junction normal. Visualized upper cervical spine unremarkable. Bone marrow signal intensity normal. No scalp soft tissue abnormality. Sinuses/Orbits: Globes and orbital soft tissues within normal limits. Paranasal sinuses are clear. Trace opacity right mastoid air cells. Inner ear structures normal. MRA HEAD FINDINGS ANTERIOR CIRCULATION: Distal cervical segments of the internal carotid arteries are patent with antegrade flow. Petrous, cavernous, and supraclinoid segments widely patent without flow limiting stenosis.  Right ICA terminus somewhat ectatic. Dominant right A1 segment, which likely accounts for the slightly prominent right ICA is compared to the left. Left A1 segment  patent as well. Anterior communicating artery normal. Anterior cerebral arteries patent to their distal aspects. M1 segments patent without stenosis or occlusion. MCA bifurcations normal. Distal MCA branches well opacified and symmetric. POSTERIOR CIRCULATION: The vertebral artery is patent to the vertebrobasilar junction. Posterior inferior cerebral arteries patent proximally. Basilar artery widely patent. Superior cerebellar and posterior cerebral arteries patent bilaterally. Prominent right posterior communicating artery noted. No aneurysm or vascular malformation. IMPRESSION: MRI HEAD IMPRESSION: 1. No acute intracranial process. 2. Temporal lobe predominant cerebral atrophy. 3. Moderate chronic microvascular ischemic disease. MRA HEAD IMPRESSION: Negative intracranial MRA. No large or proximal arterial branch occlusion. No high-grade or correctable stenosis. Electronically Signed   By: Jeannine Boga M.D.   On: 05/06/2016 23:17   EEG 11/17: Technical Summary: A multichannel digital EEG recording measured by the international 10-20 system with electrodes applied with paste and impedances below 5000 ohms performed in our laboratory with EKG monitoring in an awake and asleep patient.  Hyperventilation and photic stimulation were not performed.  The digital EEG was referentially recorded, reformatted, and digitally filtered in a variety of bipolar and referential montages for optimal display.    Description: The patient is awake and asleep during the recording.  During maximal wakefulness, there is a symmetric, medium voltage 8.5 Hz posterior dominant rhythm that attenuates with eye opening.  The record is symmetric.  During drowsiness and sleep, there is an increase in theta slowing of the background, with shifting asymmetry over the  bilateral temporal regions.  Vertex waves and symmetric sleep spindles were seen.  Hyperventilation and photic stimulation were not performed.  There were no epileptiform discharges or electrographic seizures seen.    EKG lead showed sinus bradycardia.  Impression: This awake and asleep EEG is normal.    Clinical Correlation: A normal EEG does not exclude a clinical diagnosis of epilepsy. Clinical correlation is advised.   Ellouise Newer, M.D.  Subjective: Patient without complaints currently. No further episodes of LOC, palpitations, chest pain, dyspnea.   Discharge Exam: Vitals:   05/07/16 1947 05/08/16 0616  BP: (!) 141/55 (!) 139/58  Pulse: 69 64  Resp: 18 20  Temp: 98.6 F (37 C) 98.3 F (36.8 C)   Vitals:   05/07/16 1312 05/07/16 1832 05/07/16 1947 05/08/16 0616  BP: (!) 113/52 (!) 126/57 (!) 141/55 (!) 139/58  Pulse: 65 71 69 64  Resp:  18 18 20   Temp:   98.6 F (37 C) 98.3 F (36.8 C)  TempSrc:   Oral Oral  SpO2:  97% 95% 95%  Weight:      Height:       General: Elderly, pleasant female in no acute distress Cardiovascular: RRR, S1/S2 +, no rubs, no gallops. No JVD. Respiratory: Nonlabored, no crackles Abdominal: Soft, NT, ND, bowel sounds + Extremities: no edema, no cyanosis  The results of significant diagnostics from this hospitalization (including imaging, microbiology, ancillary and laboratory) are listed below for reference.    Labs: Basic Metabolic Panel:  Recent Labs Lab 05/03/16 1010 05/06/16 1339  NA 139 141  K 3.6 3.8  CL 105 107  CO2 26 28  GLUCOSE 110* 108*  BUN 12 11  CREATININE 0.88 0.68  CALCIUM 9.2 9.2   Liver Function Tests:  Recent Labs Lab 05/03/16 1010  AST 23  ALT 21  ALKPHOS 59  BILITOT 0.9  PROT 5.9*  ALBUMIN 3.7   CBC:  Recent Labs Lab 05/03/16 1010 05/06/16 1339  WBC 5.2 4.4  NEUTROABS 3.8 2.4  HGB  15.2* 14.6  HCT 44.9 41.7  MCV 91.3 89.1  PLT 159 174   Cardiac Enzymes:  Recent Labs Lab  05/06/16 1823 05/06/16 2338 05/07/16 0528  TROPONINI <0.03 <0.03 <0.03   Urinalysis    Component Value Date/Time   COLORURINE YELLOW 05/03/2016 1107   APPEARANCEUR CLEAR 05/03/2016 1107   LABSPEC 1.020 05/03/2016 1107   PHURINE 6.0 05/03/2016 1107   GLUCOSEU NEGATIVE 05/03/2016 1107   HGBUR NEGATIVE 05/03/2016 1107   BILIRUBINUR NEGATIVE 05/03/2016 1107   KETONESUR NEGATIVE 05/03/2016 1107   PROTEINUR NEGATIVE 05/03/2016 1107   UROBILINOGEN 0.2 03/01/2015 0930   NITRITE NEGATIVE 05/03/2016 1107   LEUKOCYTESUR NEGATIVE 05/03/2016 1107   Time coordinating discharge: Over 30 minutes  Vance Gather, MD  [facility] 05/08/2016, 10:54 AM Pager [phone]  If 7PM-7AM, please contact night-coverage [email or web address] Password [email or web address]

## 2016-05-07 NOTE — Progress Notes (Signed)
PROGRESS NOTE  Valerie Chen  [id number] DOB: 11/08/1928 DOA: 05/06/2016 PCP: Monico Blitz, MD  Outpatient Specialists: Dr. Ron Parker, cardiology  Brief Narrative: Valerie Chen is a 82 y.o. female with alzheimer's dementia, CAD, h/o TIA, HTN, HLD brought 11/16 for syncope and AMS. She's had multiple episodes of sudden LOC and collapse, sometimes remaining altered for 10-15 minutes afterward. Some shaking was witnessed, no incontinence, and she was verbally responsive during shaking. She's been seen in the ED 3 times in the past few months for similar presentation, presumed to be orthostatic or vasovagal syncope. She's been consistently hypotensive, so anti-HTN medications have been decreased. Telemetry has not been showing arrhythmia. On arrival patient was at mental baseline, CT reviewed from PTA 11/13 showed age-related white matter changes. She was admitted for syncope evaluation. MRI/MRA and carotid dopplers have been noncontributory. EEG 11/17 was normal. She was being prepared for discharge when she turned grey, felt extremely light-headed, and was found to have BP 70/30 improved when laying. She had received norvasc and lasix. She will be kept overnight for further observation, holding norvasc and lasix. Plan to follow up with cardiology as an outpatient.   Assessment & Plan: Principal Problem:   Syncope Active Problems:   Dementia   CAD (coronary artery disease)   Essential hypertension   Coarse tremors   Syncope: Arrhythmia vs. iatrogenic hypotension as etiology. No evidence of occlusion/stenosis on carotid dopplers, MRI/MRA. EEG confirms suspicion that this is not epileptic etiology.  - Continue telemetry. If bradycardia becomes more pronounced will consult cardiology.  - Observe today due to symptomatic hypotension, hold BP medications, and reevaluate for discharge in AM.  Dementia: Chronic, stable.  - Continue seroquel   CAD: Not thought to be candidate for interventions.  -  Continue aspirin. beta blocker and ACE inhibitor stopped PTA due to hypotension.  - No ACS, troponins negative.   Essential hypertension:  - Holding lasix and norvasc. While not orthostatic, she does continue to have low blood pressures likely contributing to symptoms causing presentation.   Coarse tremors: Nonepilectic clinically and negative EEG. Likely related to anxiety. - Monitor  DVT prophylaxis: SCDs Code Status: DNR Family Communication: Caregiver (24hrs) at bedside. Disposition Plan: Home within 24 hours.  Consultants:   None  Procedures:   EEG 11/17  Antimicrobials:  None   Subjective: Patient confused, per caregiver at baseline. Had episode of light headedness after recieving BP medications.   Objective: Vitals:   05/07/16 0601 05/07/16 1305 05/07/16 1306 05/07/16 1312  BP: 128/63 (!) 70/32 (!) 100/58 (!) 113/52  Pulse: 63 64  65  Resp: 18 20    Temp: 98.5 F (36.9 C) 98.1 F (36.7 C)    TempSrc: Oral Axillary    SpO2: 98% 94%    Weight:      Height:        Intake/Output Summary (Last 24 hours) at 05/07/16 1556 Last data filed at 05/07/16 1300  Gross per 24 hour  Intake              480 ml  Output                0 ml  Net              480 ml   Filed Weights   05/06/16 1754  Weight: 89.6 kg (197 lb 8.5 oz)    Examination: General exam: Elderly, confused female in no distress  Respiratory system: Non-labored breathing. Clear to auscultation bilaterally.  Cardiovascular system: Regular  rate and rhythm. No murmur, rub, or gallop. No JVD, and no pedal edema. Gastrointestinal system: Abdomen soft, non-tender, non-distended, with normoactive bowel sounds. No organomegaly or masses felt. Central nervous system: No focal neurological deficits. Extremities: Warm, no deformities Skin: No rashes, lesions no ulcers Psychiatry: Impaired memory, disoriented.  Data Reviewed: I have personally reviewed following labs and imaging studies  CBC:  Recent  Labs Lab 05/03/16 1010 05/06/16 1339  WBC 5.2 4.4  NEUTROABS 3.8 2.4  HGB 15.2* 14.6  HCT 44.9 41.7  MCV 91.3 89.1  PLT 159 [id number]   Basic Metabolic Panel:  Recent Labs Lab 05/03/16 1010 05/06/16 1339  NA 139 141  K 3.6 3.8  CL 105 107  CO2 26 28  GLUCOSE 110* 108*  BUN 12 11  CREATININE 0.88 0.68  CALCIUM 9.2 9.2   GFR: Estimated Creatinine Clearance: 55.8 mL/min (by C-G formula based on SCr of 0.68 mg/dL). Liver Function Tests:  Recent Labs Lab 05/03/16 1010  AST 23  ALT 21  ALKPHOS 59  BILITOT 0.9  PROT 5.9*  ALBUMIN 3.7   No results for input(s): LIPASE, AMYLASE in the last 168 hours. No results for input(s): AMMONIA in the last 168 hours. Coagulation Profile: No results for input(s): INR, PROTIME in the last 168 hours. Cardiac Enzymes:  Recent Labs Lab 05/06/16 1823 05/06/16 2338 05/07/16 0528  TROPONINI <0.03 <0.03 <0.03   BNP (last 3 results) No results for input(s): PROBNP in the last 8760 hours. HbA1C: No results for input(s): HGBA1C in the last 72 hours. CBG: No results for input(s): GLUCAP in the last 168 hours. Lipid Profile: No results for input(s): CHOL, HDL, LDLCALC, TRIG, CHOLHDL, LDLDIRECT in the last 72 hours. Thyroid Function Tests: No results for input(s): TSH, T4TOTAL, FREET4, T3FREE, THYROIDAB in the last 72 hours. Anemia Panel: No results for input(s): VITAMINB12, FOLATE, FERRITIN, TIBC, IRON, RETICCTPCT in the last 72 hours. Urine analysis:    Component Value Date/Time   COLORURINE YELLOW 05/03/2016 1107   APPEARANCEUR CLEAR 05/03/2016 1107   LABSPEC 1.020 05/03/2016 1107   PHURINE 6.0 05/03/2016 1107   GLUCOSEU NEGATIVE 05/03/2016 1107   HGBUR NEGATIVE 05/03/2016 1107   BILIRUBINUR NEGATIVE 05/03/2016 1107   KETONESUR NEGATIVE 05/03/2016 1107   PROTEINUR NEGATIVE 05/03/2016 1107   UROBILINOGEN 0.2 03/01/2015 0930   NITRITE NEGATIVE 05/03/2016 1107   LEUKOCYTESUR NEGATIVE 05/03/2016 1107   Sepsis  Labs: @LABRCNTIP (procalcitonin:4,lacticidven:4)  )No results found for this or any previous visit (from the past 240 hour(s)).   Radiology Studies: Mr Virgel Paling [id number] Contrast  Result Date: 05/06/2016 CLINICAL DATA:  Initial evaluation for loss of consciousness. EXAM: MRI HEAD WITHOUT CONTRAST MRA HEAD WITHOUT CONTRAST TECHNIQUE: Multiplanar, multiecho pulse sequences of the brain and surrounding structures were obtained without intravenous contrast. Angiographic images of the head were obtained using MRA technique without contrast. COMPARISON:  Prior CT from 05/03/2016. FINDINGS: MRI HEAD FINDINGS Brain: Diffuse prominence of the CSF containing spaces is compatible with generalized cerebral atrophy. There is more pronounced atrophy involving the anterior temporal lobes bilaterally. Patchy and confluent T2/FLAIR hyperintensity within the periventricular and deep white matter both cerebral hemispheres most consistent with chronic microvascular ischemic changes, moderate in nature. No abnormal foci of restricted diffusion to suggest acute or subacute ischemia. Gray-white matter differentiation maintained. No definite acute intracranial hemorrhage, although evaluation limited as gradient sequences unavailable. No mass lesion, midline shift, or mass effect. Mild ventricular prominence related to global parenchymal volume loss without hydrocephalus. No extra-axial fluid collection. Major  dural sinuses are grossly patent. No abnormal enhancement. Pituitary gland within normal limits. Vascular: Major intracranial vascular flow voids are maintained. Skull and upper cervical spine: Craniocervical junction normal. Visualized upper cervical spine unremarkable. Bone marrow signal intensity normal. No scalp soft tissue abnormality. Sinuses/Orbits: Globes and orbital soft tissues within normal limits. Paranasal sinuses are clear. Trace opacity right mastoid air cells. Inner ear structures normal. MRA HEAD FINDINGS ANTERIOR  CIRCULATION: Distal cervical segments of the internal carotid arteries are patent with antegrade flow. Petrous, cavernous, and supraclinoid segments widely patent without flow limiting stenosis. Right ICA terminus somewhat ectatic. Dominant right A1 segment, which likely accounts for the slightly prominent right ICA is compared to the left. Left A1 segment patent as well. Anterior communicating artery normal. Anterior cerebral arteries patent to their distal aspects. M1 segments patent without stenosis or occlusion. MCA bifurcations normal. Distal MCA branches well opacified and symmetric. POSTERIOR CIRCULATION: The vertebral artery is patent to the vertebrobasilar junction. Posterior inferior cerebral arteries patent proximally. Basilar artery widely patent. Superior cerebellar and posterior cerebral arteries patent bilaterally. Prominent right posterior communicating artery noted. No aneurysm or vascular malformation. IMPRESSION: MRI HEAD IMPRESSION: 1. No acute intracranial process. 2. Temporal lobe predominant cerebral atrophy. 3. Moderate chronic microvascular ischemic disease. MRA HEAD IMPRESSION: Negative intracranial MRA. No large or proximal arterial branch occlusion. No high-grade or correctable stenosis. Electronically Signed   By: Jeannine Boga M.D.   On: 05/06/2016 23:17   Mr Jeri Cos [id number] Contrast  Result Date: 05/06/2016 CLINICAL DATA:  Initial evaluation for loss of consciousness. EXAM: MRI HEAD WITHOUT CONTRAST MRA HEAD WITHOUT CONTRAST TECHNIQUE: Multiplanar, multiecho pulse sequences of the brain and surrounding structures were obtained without intravenous contrast. Angiographic images of the head were obtained using MRA technique without contrast. COMPARISON:  Prior CT from 05/03/2016. FINDINGS: MRI HEAD FINDINGS Brain: Diffuse prominence of the CSF containing spaces is compatible with generalized cerebral atrophy. There is more pronounced atrophy involving the anterior temporal lobes  bilaterally. Patchy and confluent T2/FLAIR hyperintensity within the periventricular and deep white matter both cerebral hemispheres most consistent with chronic microvascular ischemic changes, moderate in nature. No abnormal foci of restricted diffusion to suggest acute or subacute ischemia. Gray-white matter differentiation maintained. No definite acute intracranial hemorrhage, although evaluation limited as gradient sequences unavailable. No mass lesion, midline shift, or mass effect. Mild ventricular prominence related to global parenchymal volume loss without hydrocephalus. No extra-axial fluid collection. Major dural sinuses are grossly patent. No abnormal enhancement. Pituitary gland within normal limits. Vascular: Major intracranial vascular flow voids are maintained. Skull and upper cervical spine: Craniocervical junction normal. Visualized upper cervical spine unremarkable. Bone marrow signal intensity normal. No scalp soft tissue abnormality. Sinuses/Orbits: Globes and orbital soft tissues within normal limits. Paranasal sinuses are clear. Trace opacity right mastoid air cells. Inner ear structures normal. MRA HEAD FINDINGS ANTERIOR CIRCULATION: Distal cervical segments of the internal carotid arteries are patent with antegrade flow. Petrous, cavernous, and supraclinoid segments widely patent without flow limiting stenosis. Right ICA terminus somewhat ectatic. Dominant right A1 segment, which likely accounts for the slightly prominent right ICA is compared to the left. Left A1 segment patent as well. Anterior communicating artery normal. Anterior cerebral arteries patent to their distal aspects. M1 segments patent without stenosis or occlusion. MCA bifurcations normal. Distal MCA branches well opacified and symmetric. POSTERIOR CIRCULATION: The vertebral artery is patent to the vertebrobasilar junction. Posterior inferior cerebral arteries patent proximally. Basilar artery widely patent. Superior  cerebellar and posterior cerebral arteries  patent bilaterally. Prominent right posterior communicating artery noted. No aneurysm or vascular malformation. IMPRESSION: MRI HEAD IMPRESSION: 1. No acute intracranial process. 2. Temporal lobe predominant cerebral atrophy. 3. Moderate chronic microvascular ischemic disease. MRA HEAD IMPRESSION: Negative intracranial MRA. No large or proximal arterial branch occlusion. No high-grade or correctable stenosis. Electronically Signed   By: Jeannine Boga M.D.   On: 05/06/2016 23:17    Scheduled Meds: . aspirin EC  81 mg Oral Daily  . docusate sodium  200 mg Oral Daily  . furosemide  20 mg Oral Daily  . potassium chloride  20 mEq Oral Daily  . QUEtiapine  25 mg Oral Daily  . sodium chloride flush  3 mL Intravenous Q12H  . cyanocobalamin  500 mcg Oral Daily   Continuous Infusions:   LOS: 0 days   Time spent: 25 minutes.  Vance Gather, MD [facility] Pager [phone]  If 7PM-7AM, please contact night-coverage [email or web address] Password [email or web address] 05/07/2016, 3:56 PM

## 2016-05-07 NOTE — Care Management Obs Status (Signed)
[facility] NOTIFICATION   Patient Details  Name: Chanah Ofallon MRN: [id number] Date of Birth: Feb 13, 1929   Medicare Observation Status Notification Given:  Yes    Purcell Mouton, RN 05/07/2016, 4:25 PM

## 2016-05-07 NOTE — Progress Notes (Signed)
Patient's family called RN to room after patient was prepared for discharge (telemetry and SL removed). Patient found to be sitting in chair, lips blue, color ashen, clammy and very weak. Slow to respond verbally. BP 70/32. Pulse 64 Assisted back to bed and BP rechecked at 100/58. Color improving while lying in bed. Patient never lost consciousness. Still feels very weak and tired. MD notified and new orders obtained. Eulas Post, RN

## 2016-05-08 DIAGNOSIS — I2510 Atherosclerotic heart disease of native coronary artery without angina pectoris: Secondary | ICD-10-CM | POA: Diagnosis not present

## 2016-05-08 DIAGNOSIS — G308 Other Alzheimer's disease: Secondary | ICD-10-CM | POA: Diagnosis not present

## 2016-05-08 DIAGNOSIS — R55 Syncope and collapse: Secondary | ICD-10-CM | POA: Diagnosis not present

## 2016-05-08 DIAGNOSIS — G252 Other specified forms of tremor: Secondary | ICD-10-CM | POA: Diagnosis not present

## 2016-05-11 ENCOUNTER — Ambulatory Visit (INDEPENDENT_AMBULATORY_CARE_PROVIDER_SITE_OTHER): Payer: Medicare Other | Admitting: Physician Assistant

## 2016-05-11 ENCOUNTER — Encounter (INDEPENDENT_AMBULATORY_CARE_PROVIDER_SITE_OTHER): Payer: Self-pay

## 2016-05-11 ENCOUNTER — Encounter: Payer: Self-pay | Admitting: Physician Assistant

## 2016-05-11 VITALS — BP 146/80 | HR 70 | Ht 64.0 in | Wt 192.0 lb

## 2016-05-11 DIAGNOSIS — R42 Dizziness and giddiness: Secondary | ICD-10-CM

## 2016-05-11 DIAGNOSIS — F0281 Dementia in other diseases classified elsewhere with behavioral disturbance: Secondary | ICD-10-CM

## 2016-05-11 DIAGNOSIS — R55 Syncope and collapse: Secondary | ICD-10-CM

## 2016-05-11 DIAGNOSIS — I472 Ventricular tachycardia: Secondary | ICD-10-CM

## 2016-05-11 DIAGNOSIS — G308 Other Alzheimer's disease: Secondary | ICD-10-CM

## 2016-05-11 DIAGNOSIS — G309 Alzheimer's disease, unspecified: Secondary | ICD-10-CM

## 2016-05-11 DIAGNOSIS — I4729 Other ventricular tachycardia: Secondary | ICD-10-CM

## 2016-05-11 DIAGNOSIS — R600 Localized edema: Secondary | ICD-10-CM | POA: Diagnosis not present

## 2016-05-11 DIAGNOSIS — I10 Essential (primary) hypertension: Secondary | ICD-10-CM | POA: Diagnosis not present

## 2016-05-11 DIAGNOSIS — I2510 Atherosclerotic heart disease of native coronary artery without angina pectoris: Secondary | ICD-10-CM

## 2016-05-11 NOTE — Progress Notes (Signed)
Cardiology Office Note:    Date:  05/11/2016   ID:  Rennis Golden, DOB [birth year], MRN [id number]  PCP:  Monico Blitz, MD  Cardiologist:  Dr. Cleatis Polka >> Richardson Dopp, PA-C   Electrophysiologist:  N/a Neurologist: Dr. Krista Blue Psych: Dr. Geanie Kenning [facility])  Referring MD: Monico Blitz, MD   Chief Complaint  Patient presents with  . Hospitalization Follow-up    syncope    History of Present Illness:    Valerie Chen is a 83 y.o. female with a hx of migraine HAs, presumed CAD and chronic chest pain.  She was initially seen by Dr. Ron Parker in 08/2010 at [facility] with abdominal and chest pain. She ruled out for MI. Nuclear scan raised the possibility of anteroseptal and inferior septal ischemia. She had normal wall motion and normal LVF on echo. It was presumed that she has CAD. She did have a GI bleed complicating her hospitalization. She has dementia. Decision was made to treat medically.   She has had trouble with LE edema in the past.  Echo 02/25/12: Mild LVH, EF 0000000, grade 1 diastolic dysfunction, trivial AI, mild MR. Lasix was adjusted.    I have not seen her here since 2014.  When last seen, she had moved from ALF to home.  Her family continued to opt for medical rx for her potential angina.    She was admitted in 7/17 with vasovagal syncope during a bowel movement. Echocardiogram demonstrated normal LV function that time. Orthostatic vital signs were normal.  She was evaluated in the emergency room 05/03/16 with loss of consciousness. There is questionable seizure activity. Outpatient EEG was to be arranged. She has had hypotension. Pressure medications have been adjusted. She was admitted again on 11/16-11/18 MRI/MRA and carotid Dopplers were unremarkable. EEG 11/17 was normal. She did have an episode of lightheadedness in the hospital with associated blood pressure 70/30. Amlodipine and Lasix were held.  She is here today with her daughter and CNA.  She provides no  hx 2/2 to dementia.  She was apparently unconscious for several minutes on 05/03/16 at home.  She was not breathing and no pulse was felt.  She was also apparently turning blue according to her daughter and CNA.  No CPR was administered.  She regained consciousness when O2 was applied at home by EMS.  The second episode that prompted her admission was similar in that she was pale and her lips were blue but she did not lose consciousness.  She has not had a recurrence of syncope since DC.  She was dizzy today but her daughter notes she complains of spinning.  She has not had chest pain.  She is chronically short of breath.  She has chronic LE edema and wears compression stockings.    Prior CV studies that were reviewed today include:    Carotid US 05/07/16 No ICA stenosis  Echo 12/30/15 EF 55-60, normal wall motion, grade 1 diastolic dysfunction, mild AI  Echo 11/02/14 EF 50-55  Past Medical History:  Diagnosis Date  . Alzheimer disease   . Arthritis   . CAD (coronary artery disease)    Probable CAD, nuclear February, 2012, possible anteroseptal and inferoseptal ischemia but the study was technically difficult  . Chest pain    Hospital February, 2012, nuclear, possible anteroseptal and inferoseptal ischemia, technically difficult, medical therapy  . Dementia    Significant  . Diastolic dysfunction    Echo, February, 2012  . Diastolic dysfunction   . DNR (do  not resuscitate)   . Dyslipidemia   . Edema    hospitalizations September, 2013, some fluid overload  . Ejection fraction    EF 55%, echo, February, 2012  //   EF 55-60%, echo, February 25, 2012  . Essential hypertension   . GI bleed    February, 2012 incomplete colonoscopy, hemorrhoids, needs complete colonoscopy  . Hx: UTI (urinary tract infection)   . Hyperlipidemia   . Leg pain, bilateral   . Major depression    2 overdoses in 1975 & 1977  . Migraines    25 year  . Orthostatic hypotension   . Suicide attempt 1985   2  attempts with Valium  . TIA (transient ischemic attack)       ???question of a TIA in February, 2012, carotid Doppler, August 11, 2010 no significant abnormalities    Past Surgical History:  Procedure Laterality Date  . ABDOMINAL HYSTERECTOMY    . DIAGNOSTIC MAMMOGRAM  12/29/2000   Mammogram, suspicious lesions -BX pending  . TONSILLECTOMY      Current Medications: Current Meds  Medication Sig  . aspirin EC 81 MG tablet Take 81 mg by mouth daily.   . Cranberry-Vitamin C-Vitamin E (CRANBERRY SUPER STRENGTH PO) Take 2 capsules by mouth 3 (three) times daily after meals.  . cyanocobalamin 500 MCG tablet Take 500 mcg by mouth daily.   Marland Kitchen docusate sodium (COLACE) 100 MG capsule Take 200 mg by mouth daily.  . furosemide (LASIX) 40 MG tablet Take 20 mg by mouth daily.  Marland Kitchen OVER THE COUNTER MEDICATION Take 1 tablet by mouth 3 (three) times daily after meals. Medication:  Curamin  . OVER THE COUNTER MEDICATION Take 1 tablet by mouth daily. Medication:  Solgar V  . potassium chloride SA (K-DUR,KLOR-CON) 20 MEQ tablet Take 20 mEq by mouth daily.  . QUEtiapine (SEROQUEL) 25 MG tablet Take 25 mg by mouth daily.      Allergies:   Levaquin [levofloxacin]   Social History   Social History  . Marital status: Widowed    Spouse name: N/A  . Number of children: 3  . Years of education: HS   Occupational History  . RETIRED    Social History Main Topics  . Smoking status: Never Smoker  . Smokeless tobacco: Never Used  . Alcohol use No  . Drug use: No  . Sexual activity: No   Other Topics Concern  . None   Social History Narrative   Has 24/7 caregiver.      2 husbands   3 children      Diet: Heart Health   Caffeine- yes, tea   Married- widow   House- 1 stories, 2 persons   Pets- no, puppy died 1 1/2 years ago   Current/Past profession- Retired Engineer, manufacturing systems   Exercise- Yes, walking   Living Will-Yes   DNR- Yes   POA/HPOA- Yes   Right-handed.        Family History:  The  patient's family history includes Arthritis in her father; Cancer in her father; Depression in her mother and other; Heart attack in her brother and father; Heart disease in her brother; Hypertension in her daughter; Pneumonia in her brother; Suicidality in her mother and son.   ROS:   Please see the history of present illness.    Review of Systems  Eyes: Positive for visual disturbance.  Cardiovascular: Positive for dyspnea on exertion, leg swelling and syncope.  Hematologic/Lymphatic: Bruises/bleeds easily.  Musculoskeletal: Positive for back pain and myalgias.  Neurological: Positive for dizziness, headaches and loss of balance.  Psychiatric/Behavioral: Positive for depression. The patient is nervous/anxious.    All other systems reviewed and are negative.   EKGs/Labs/Other Test Reviewed:    EKG:  EKG is  ordered today.  The ekg ordered today demonstrates NSR, HR 69, rightward axis, QTc 415 ms  Recent Labs: 12/29/2015: TSH 2.326 05/03/2016: ALT 21 05/06/2016: BUN 11; Creatinine, Ser 0.68; Hemoglobin 14.6; Platelets 174; Potassium 3.8; Sodium 141   Recent Lipid Panel    Component Value Date/Time   CHOL 179 11/01/2014 2238   TRIG 96 11/01/2014 2238   HDL 72 11/01/2014 2238   CHOLHDL 2.5 11/01/2014 2238   VLDL 19 11/01/2014 2238   LDLCALC 88 11/01/2014 2238   Head CT 05/03/16 IMPRESSION: No acute intracranial abnormality. Stable mild for age nonspecific white matter changes.  MRI/MRA Head 05/06/16 IMPRESSION: MRI HEAD IMPRESSION: 1. No acute intracranial process. 2. Temporal lobe predominant cerebral atrophy. 3. Moderate chronic microvascular ischemic disease. MRA HEAD IMPRESSION: Negative intracranial MRA. No large or proximal arterial branch occlusion. No high-grade or correctable stenosis.   Physical Exam:    VS:  BP (!) 146/80   Pulse 70   Ht 5\' 4"  (1.626 m)   Wt 192 lb (87.1 kg)   BMI 32.96 kg/m    Orthostatic VS for the past 24 hrs (Last 3 readings):   BP- Lying Pulse- Lying BP- Sitting Pulse- Sitting BP- Standing at 0 minutes Pulse- Standing at 0 minutes BP- Standing at 3 minutes Pulse- Standing at 3 minutes  05/11/16 1127 146/80 70 134/69 70 135/80 83 149/78 81      Wt Readings from Last 3 Encounters:  05/11/16 192 lb (87.1 kg)  05/06/16 197 lb 8.5 oz (89.6 kg)  03/15/16 190 lb (86.2 kg)     Physical Exam  Constitutional: She appears well-developed and well-nourished. No distress.  HENT:  Head: Normocephalic and atraumatic.  Eyes: No scleral icterus.  Neck: No JVD present.  Cardiovascular: Normal rate, regular rhythm and normal heart sounds.   Pulmonary/Chest: Effort normal. She has no wheezes. She has no rales.  Abdominal: Soft. There is no tenderness.  Musculoskeletal: She exhibits edema.  Trace bilateral LE edema  Neurological: She is alert.  Skin: Skin is warm and dry.  Psychiatric: Her affect is blunt.    ASSESSMENT:    1. Syncope, unspecified syncope type   2. Essential hypertension   3. Leg edema   4. Dizziness   5. Alzheimer's dementia with behavioral disturbance, unspecified timing of dementia onset   6. NSVT (nonsustained ventricular tachycardia) (HCC)   7. Coronary artery disease involving native coronary artery of native heart without angina pectoris    PLAN:    In order of problems listed above:  1. Syncope - Etiology not clear.  I am more suspicious of hypotension from medications as a cause for her symptoms.  Her daughter questions if it could be related to taking Co Q10 without taking a statin or the supplement tumeric.  I doubt this.  But, she should remain off of these for now.  She should remain off of Amlodipine and Lasix given her documented low BPs.  She can take as needed for high BP or edema. I reviewed her hospital chart extensively.  She did have an episode of NSVT x 6 beats on tele.  Her EF was normal in 7/17.  Her ECG is unchanged.  I doubt her episode of syncope resulted from pulseless VT or VF  arrest.  Her family notes she was unconscious for 10-15 minutes.  If this were 2/2 VT or VF, she would not have survived. She is a DNR and is not a candidate for invasive testing or aggressive management.  I had a long d/w the patient's daughter and CNA.  We could consider an event monitor but I do not think she will tolerate this.  I did offer a repeat Echo.  If her EF remains normal and there are no WMA, the risk of SCD is quite low.  If her EF is low and she has new WMA, then she would be high risk for SCD.  Her daughter would like to pursue the echocardiogram.  -  Check Echo.  2. HTN - BP is at target off of medications.  She can take prn Norvasc for BP that is high (>160/95).  3. Edema - She should remain off of Lasix.  She can take as needed for increased edema.  4. Dizziness - She may have BPPV in addition to everything else that is impacting her symptoms.  I have recommended FU with her neurologist for evaluation.  5. Dementia - She should FU with neurology for her dementia also to see if there are any adjustments that should be considered to help with her condition.    6. NSVT - As noted, will get echo to recheck EF.  Her potassium was normal in the hospital.    7. CAD - Presumed.  She is DNR.  She is not a candidate for ischemic testing.  She is not having chest pain. Her ECG is unchanged.  Continue ASA.  Total time spent with patient today 45 minutes. This includes reviewing records, evaluating the patient and coordinating care. Face-to-face time >50%.   Medication Adjustments/Labs and Tests Ordered: Current medicines are reviewed at length with the patient today.  Concerns regarding medicines are outlined above.  Medication changes, Labs and Tests ordered today are outlined in the Patient Instructions noted below. Patient Instructions  Medication Instructions:  1. Your physician recommends that you continue on your current medications as directed. Please refer to the Current  Medication list given to you today.  Labwork: NONE  Testing/Procedures: Your physician has requested that you have an echocardiogram. Echocardiography is a painless test that uses sound waves to create images of your heart. It provides your doctor with information about the size and shape of your heart and how well your heart's chambers and valves are working. This procedure takes approximately one hour. There are no restrictions for this procedure.  Follow-Up: AS NEEDED AT THIS TIME  Any Other Special Instructions Will Be Listed Below (If Applicable).  If you need a refill on your cardiac medications before your next appointment, please call your pharmacy.  Signed, Richardson Dopp, PA-C  05/11/2016 12:53 PM    [facility] [address], [place], [place]  [address] Phone: [phone]; Fax: [phone]

## 2016-05-11 NOTE — Patient Instructions (Addendum)
Medication Instructions:  1. Your physician recommends that you continue on your current medications as directed. Please refer to the Current Medication list given to you today.  Labwork: NONE  Testing/Procedures: Your physician has requested that you have an echocardiogram. Echocardiography is a painless test that uses sound waves to create images of your heart. It provides your doctor with information about the size and shape of your heart and how well your heart's chambers and valves are working. This procedure takes approximately one hour. There are no restrictions for this procedure.  Follow-Up: AS NEEDED AT THIS TIME  Any Other Special Instructions Will Be Listed Below (If Applicable).  If you need a refill on your cardiac medications before your next appointment, please call your pharmacy.

## 2016-05-12 ENCOUNTER — Ambulatory Visit (INDEPENDENT_AMBULATORY_CARE_PROVIDER_SITE_OTHER): Payer: Medicare Other | Admitting: Podiatry

## 2016-05-12 ENCOUNTER — Encounter: Payer: Self-pay | Admitting: Podiatry

## 2016-05-12 VITALS — BP 148/73 | HR 72 | Ht 64.0 in | Wt 192.0 lb

## 2016-05-12 DIAGNOSIS — M21619 Bunion of unspecified foot: Secondary | ICD-10-CM

## 2016-05-12 DIAGNOSIS — M21969 Unspecified acquired deformity of unspecified lower leg: Secondary | ICD-10-CM | POA: Diagnosis not present

## 2016-05-12 DIAGNOSIS — M722 Plantar fascial fibromatosis: Secondary | ICD-10-CM | POA: Diagnosis not present

## 2016-05-12 NOTE — Patient Instructions (Signed)
Seen for pain in left heel.  Neurovascular status are within normal. X-ray findings are normal other than bunion problems.  Diagnosis: Acute plantar fasciitis left heel.  Cortisone injection given to left heel.  Stay in well supported Tennis shoes. Avoid barefoot or flat slippers.  Return if pain persist.

## 2016-05-12 NOTE — Progress Notes (Signed)
SUBJECTIVE: 82 y.o. year old female presents with her care taker. Has pain in left heel duration of a week.. Unable to bear weight on the heel when getting up at night. Family and care taker are afraid she might take a fall.   REVIEW OF SYSTEMS: A comprehensive review of systems was negative except for: Hypertension and Alzheimers.   OBJECTIVE: DERMATOLOGIC EXAMINATION: No abnormal skin lesions.  VASCULAR EXAMINATION OF LOWER LIMBS: All pedal pulses are palpable with normal pulsation.  No edema or erythema noted at the affected left heel. Capillary Filling times within 3 seconds in all digits.  There is no temperature change in the affected area or lower limbs.   NEUROLOGIC EXAMINATION OF THE LOWER LIMBS: All epicritic and tactile sensations grossly intact.  MUSCULOSKELETAL EXAMINATION: Positive for high arch cavus foot. Positive of hallux valgus with bunion bilateral. Pain under center heel left foot with pressure.  RADIOGRAPHIC STUDIES:  AP View:  Osteopenia trough out forefoot. Enlarged medial head of the first metatarsal bone with lateral deviation of both great toes. Lateral view:  Elevated first metatarsal bones, no plantar spurs, no acute changes in rearfoot or forefoot noted.  ASSESSMENT: Acute plantar fasciitis left heel.  PLAN: Reviewed the findings with care taker and available treatment options. Left heel injected with mixture of 4 mg Dexamethasone, 1 cc of 0.5% Marcaine plain and 1 cc of Xylocaine plain. Patient tolerated well without difficulty.  Instructed to stay in well supported tennis shoes and avoid flats or barefoot. Return if pain continues.

## 2016-05-21 ENCOUNTER — Ambulatory Visit: Payer: Medicare Other | Admitting: Podiatry

## 2016-05-24 ENCOUNTER — Ambulatory Visit: Payer: Medicare Other | Admitting: Podiatry

## 2016-05-24 DIAGNOSIS — F331 Major depressive disorder, recurrent, moderate: Secondary | ICD-10-CM | POA: Diagnosis not present

## 2016-05-25 ENCOUNTER — Ambulatory Visit (INDEPENDENT_AMBULATORY_CARE_PROVIDER_SITE_OTHER): Payer: Medicare Other | Admitting: Podiatry

## 2016-05-25 ENCOUNTER — Encounter: Payer: Self-pay | Admitting: Podiatry

## 2016-05-25 DIAGNOSIS — M722 Plantar fascial fibromatosis: Secondary | ICD-10-CM | POA: Diagnosis not present

## 2016-05-25 DIAGNOSIS — M21969 Unspecified acquired deformity of unspecified lower leg: Secondary | ICD-10-CM

## 2016-05-25 DIAGNOSIS — M79672 Pain in left foot: Secondary | ICD-10-CM | POA: Diagnosis not present

## 2016-05-25 DIAGNOSIS — M79671 Pain in right foot: Secondary | ICD-10-CM | POA: Diagnosis not present

## 2016-05-25 DIAGNOSIS — I2510 Atherosclerotic heart disease of native coronary artery without angina pectoris: Secondary | ICD-10-CM

## 2016-05-25 NOTE — Progress Notes (Signed)
SUBJECTIVE: 82 y.o. year old female presents with her care taker requesting another injection. She has last injection with Dexamethasone on left heel on 05/12/16.  Pain is on left lateral heel. Having difficulty walking due to pain.   OBJECTIVE: DERMATOLOGIC EXAMINATION: No abnormal skin lesions.  VASCULAR EXAMINATION OF LOWER LIMBS: All pedal pulses are palpable with normal pulsation.  No edema or erythema noted at the affected left heel. Capillary Filling times within 3 seconds in all digits.  There is no temperature change in the affected area or lower limbs.   NEUROLOGIC EXAMINATION OF THE LOWER LIMBS: All epicritic and tactile sensations grossly intact.  MUSCULOSKELETAL EXAMINATION: Positive for high arch cavus foot. Positive of hallux valgus with bunion bilateral. Pain at plantar lateral left heel.   ASSESSMENT: Acute plantar fasciitis left heel.  PLAN: Reviewed the findings with care taker and available treatment options. Left heel injected with mixture of 4 mg Dexamethasone, 1 cc of 0.5% Marcaine plain and 1 cc of Xylocaine plain. Patient tolerated well without difficulty. Return if pain continues.

## 2016-05-25 NOTE — Patient Instructions (Signed)
Pain in left lateral heel. Cortisone injection given. OTC Orthotic dispensed. Return as needed.

## 2016-05-31 DIAGNOSIS — F0280 Dementia in other diseases classified elsewhere without behavioral disturbance: Secondary | ICD-10-CM | POA: Diagnosis not present

## 2016-05-31 DIAGNOSIS — Z6831 Body mass index (BMI) 31.0-31.9, adult: Secondary | ICD-10-CM | POA: Diagnosis not present

## 2016-05-31 DIAGNOSIS — E7800 Pure hypercholesterolemia, unspecified: Secondary | ICD-10-CM | POA: Diagnosis not present

## 2016-05-31 DIAGNOSIS — G309 Alzheimer's disease, unspecified: Secondary | ICD-10-CM | POA: Diagnosis not present

## 2016-05-31 DIAGNOSIS — F329 Major depressive disorder, single episode, unspecified: Secondary | ICD-10-CM | POA: Diagnosis not present

## 2016-05-31 DIAGNOSIS — Z299 Encounter for prophylactic measures, unspecified: Secondary | ICD-10-CM | POA: Diagnosis not present

## 2016-06-08 ENCOUNTER — Ambulatory Visit (HOSPITAL_COMMUNITY): Payer: Medicare Other | Attending: Cardiovascular Disease

## 2016-06-08 ENCOUNTER — Other Ambulatory Visit: Payer: Self-pay

## 2016-06-08 ENCOUNTER — Telehealth: Payer: Self-pay | Admitting: *Deleted

## 2016-06-08 ENCOUNTER — Other Ambulatory Visit: Payer: Self-pay | Admitting: Physician Assistant

## 2016-06-08 ENCOUNTER — Encounter: Payer: Self-pay | Admitting: Physician Assistant

## 2016-06-08 DIAGNOSIS — R55 Syncope and collapse: Secondary | ICD-10-CM | POA: Insufficient documentation

## 2016-06-08 DIAGNOSIS — I5189 Other ill-defined heart diseases: Secondary | ICD-10-CM | POA: Diagnosis not present

## 2016-06-08 LAB — ECHOCARDIOGRAM LIMITED
CHL CUP MV DEC (S): 176
EERAT: 11.29
EWDT: 176 ms
FS: 37 % (ref 28–44)
IVS/LV PW RATIO, ED: 1.09
LA ID, A-P, ES: 31 mm
LA diam end sys: 31 mm
LA diam index: 1.61 cm/m2
LDCA: 2.54 cm2
LV E/e' medial: 11.29
LV SIMPSON'S DISK: 63
LV TDI E'LATERAL: 7.7
LV TDI E'MEDIAL: 5.07
LV dias vol: 65 mL (ref 46–106)
LV e' LATERAL: 7.7 cm/s
LV sys vol index: 13 mL/m2
LV sys vol: 24 mL
LVDIAVOLIN: 34 mL/m2
LVEEAVG: 11.29
LVOTD: 18 mm
MV Peak grad: 3 mmHg
MV pk A vel: 91.8 m/s
MVPKEVEL: 86.9 m/s
PW: 9.52 mm — AB (ref 0.6–1.1)
Stroke v: 41 ml

## 2016-06-08 NOTE — Telephone Encounter (Signed)
DPR ok to s/w her daughter Fraser Din who is also POA. Pat notified of pt's echo results by phone with verbal understanding.

## 2016-07-30 ENCOUNTER — Ambulatory Visit (INDEPENDENT_AMBULATORY_CARE_PROVIDER_SITE_OTHER): Payer: Medicare Other | Admitting: Family Medicine

## 2016-07-30 ENCOUNTER — Encounter: Payer: Self-pay | Admitting: Family Medicine

## 2016-07-30 VITALS — BP 140/82 | HR 72 | Temp 97.7°F | Ht 64.0 in | Wt 189.2 lb

## 2016-07-30 DIAGNOSIS — F02818 Dementia in other diseases classified elsewhere, unspecified severity, with other behavioral disturbance: Secondary | ICD-10-CM

## 2016-07-30 DIAGNOSIS — E669 Obesity, unspecified: Secondary | ICD-10-CM

## 2016-07-30 DIAGNOSIS — F411 Generalized anxiety disorder: Secondary | ICD-10-CM | POA: Diagnosis not present

## 2016-07-30 DIAGNOSIS — F0281 Dementia in other diseases classified elsewhere with behavioral disturbance: Secondary | ICD-10-CM

## 2016-07-30 DIAGNOSIS — G301 Alzheimer's disease with late onset: Secondary | ICD-10-CM | POA: Diagnosis not present

## 2016-07-30 NOTE — Progress Notes (Signed)
Valerie Chen is a 83 y.o. female is here to [facility].   History of Present Illness:    1. Late onset Alzheimer's disease with behavioral disturbance: Lives with adult daughter. Has caregiver that stays from 7-7 six days per week. Overall, doing well recently. Frequent UTIs. Daughter has concerns about patient's access to care if there is a concern. On Seroquel, currently seeing Psych in [place] for this.    2. Anxiety state    Patient Active Problem List   Diagnosis Date Noted  . NSVT (nonsustained ventricular tachycardia) ([place]) 05/11/2016  . Coarse tremors 05/06/2016  . Syncope 12/29/2015  . Recurrent UTI 02/01/2015  . Essential hypertension 05/30/2013  . Edema   . Psychosis in elderly 03/12/2011  . Coronary artery disease   . Migraine headache   . Diastolic dysfunction   . TIA (transient ischemic attack)   . GI bleed   . Dyslipidemia   . Pain in joints 05/22/2009  . Vitamin D deficiency 01/27/2009  . Anemia 11/21/2008    Health Maintenance Due  Topic Date Due  . TETANUS/TDAP  10/12/1949  . ZOSTAVAX  10/13/1990  . DEXA SCAN  10/13/1995  . PNA vac Low Risk Adult (2 of 2 - PCV13) 05/21/2014   NOTE: HEALTH MAINTENANCE: "LESS IS MORE" FOR PATIENT AND FAMILY.  ADVANCED PLANNING: DNR, POA IN PLACE.   PMHx, SurgHx, SocialHx, Medications, and Allergies were reviewed in the Visit Navigator and updated as appropriate.    Past Medical History:  Diagnosis Date  . Alzheimer disease   . Arthritis   . CAD (coronary artery disease)    Probable CAD, nuclear February, 2012, possible anteroseptal and inferoseptal ischemia but the study was technically difficult  . Chest pain    Hospital February, 2012, nuclear, possible anteroseptal and inferoseptal ischemia, technically difficult, medical therapy  . Dementia    Significant  . Diastolic dysfunction    Echo, February, 2012  . Diastolic dysfunction   . DNR (do not resuscitate)   . Dyslipidemia   . Edema    hospitalizations  September, 2013, some fluid overload  . Ejection fraction    EF 55%, echo, February, 2012  //   EF 55-60%, echo, February 25, 2012 // Echo 12/17: EF 60-65, no RWMA, Gr 1 DD, normal RVSF  . Essential hypertension   . GI bleed    February, 2012 incomplete colonoscopy, hemorrhoids, needs complete colonoscopy  . Hx: UTI (urinary tract infection)   . Hyperlipidemia   . Leg pain, bilateral   . Major depression    2 overdoses in 1975 & 1977  . Migraines    25 year  . Orthostatic hypotension   . Suicide attempt 1985   2 attempts with Valium  . TIA (transient ischemic attack)       ???question of a TIA in February, 2012, carotid Doppler, August 11, 2010 no significant abnormalities    Past Surgical History:  Procedure Laterality Date  . ABDOMINAL HYSTERECTOMY    . DIAGNOSTIC MAMMOGRAM  12/29/2000   Mammogram, suspicious lesions -BX pending  . TONSILLECTOMY      Family History  Problem Relation Age of Onset  . Heart attack Father     several heart attacks  . Cancer Father     bone  . Arthritis Father   . Suicidality Mother   . Depression Mother   . Heart attack Brother   . Heart disease Brother   . Pneumonia Brother   . Suicidality Son   .  Hypertension Daughter   . Depression Other     Most family members    Social History  Substance Use Topics  . Smoking status: Never Smoker  . Smokeless tobacco: Never Used  . Alcohol use No     Current Medications and Allergies:    Current Outpatient Prescriptions:  .  amLODipine (NORVASC) 2.5 MG tablet, TAKE 1 TABLET EVERY DAY AS NEEDED, Disp: , Rfl: 1 .  aspirin EC 81 MG tablet, Take 81 mg by mouth daily. , Disp: , Rfl:  .  Cranberry-Vitamin C-Vitamin E (CRANBERRY SUPER STRENGTH PO), Take 2 capsules by mouth 3 (three) times daily after meals., Disp: , Rfl:  .  cyanocobalamin 500 MCG tablet, Take 500 mcg by mouth daily. , Disp: , Rfl:  .  docusate sodium (COLACE) 100 MG capsule, Take 200 mg by mouth daily., Disp: , Rfl:  .   furosemide (LASIX) 40 MG tablet, Take 20 mg by mouth daily., Disp: , Rfl:  .  OVER THE COUNTER MEDICATION, Take 1 tablet by mouth 3 (three) times daily after meals. Medication:  Curamin, Disp: , Rfl:  .  OVER THE COUNTER MEDICATION, Take 1 tablet by mouth daily. Medication:  Solgar V, Disp: , Rfl:  .  potassium chloride SA (K-DUR,KLOR-CON) 20 MEQ tablet, Take 20 mEq by mouth daily., Disp: , Rfl:  .  QUEtiapine (SEROQUEL) 25 MG tablet, Take 25 mg by mouth daily. , Disp: , Rfl:    Allergies  Allergen Reactions  . Levaquin [Levofloxacin] Other (See Comments)    Reaction:  Altered mental status   . Statins Other (See Comments)    Severe myalgias    Patient Information Form: Screening and ROS    Review of Systems  General:  Negative for nexplained weight loss, fever Skin: Negative for new or changing mole, sore that won't heal HEENT: Negative for trouble hearing, trouble seeing, ringing in ears, mouth sores, hoarseness, change in voice, dysphagia CV:  Negative for chest pain, dyspnea, edema, palpitations Resp: Negative for cough, dyspnea, hemoptysis GI: Negative for nausea, vomiting, diarrhea, constipation, abdominal pain, melena, hematochezia GU: Negative for dysuria, incontinence, urinary hesitance, hematuria, vaginal or penile discharge, polyuria, sexual difficulty, lumps in testicle or breasts MSK: Negative for muscle cramps or aches, joint pain or swelling Neuro: Negative for headaches, weakness, numbness, dizziness, passing out/fainting   Vitals:   Vitals:   07/30/16 1107  BP: 140/82  Pulse: 72  Temp: 97.7 F (36.5 C)  TempSrc: Oral  SpO2: 95%  Weight: 189 lb 3.2 oz (85.8 kg)  Height: 5\' 4"  (1.626 m)     Body mass index is 32.48 kg/m.   Physical Exam:     General: Alert, cooperative, appears stated age and no distress.  HEENT:  Normocephalic, without obvious abnormality, atraumatic. Conjunctivae/corneas clear. PERRL, EOM's intact. Normal TM's and external ear  canals both ears. Nares normal. Septum midline. Mucosa normal. No drainage or sinus tenderness. Lips, mucosa, and tongue normal; teeth and gums normal.  Lungs: Clear to auscultation bilaterally.  Heart:: Regular rate and rhythm, S1, S2 normal, no murmur, click, rub or gallop.  Abdomen: Soft, non-tender; bowel sounds normal; no masses,  no organomegaly.  Extremities: Extremities normal, atraumatic, no cyanosis or edema.  Pulses: 2+ and symmetric.  Skin: Skin color, texture, turgor normal. No rashes or lesions.  Neurologic: Alert and oriented X 3, normal strength and tone. Normal symmetric. reflexes. Normal coordination and gait.  Psych: Pleasantly demented, in NAD with a full range of affect, normal  behavior and no psychotic features      Assessment and Plan:    Valerie Chen was seen today for establish care.  Diagnoses and all orders for this visit:  Late onset Alzheimer's disease with behavioral disturbance We discussed expectations of care at length. I promised access when needed. I am okay with taking over the Seroquel with a note from her Psychiatrist. CPE in one month.    . Reviewed expectations re: course of current medical issues. . Discussed self-management of symptoms. . Outlined signs and symptoms indicating need for more acute intervention. . Patient verbalized understanding and all questions were answered. . See orders for this visit as documented in the electronic medical record. . Patient received an After Visit Summary.  Records requested if needed. I spent 30 minutes with this patient, greater than 50% was face-to-face time counseling regarding the above diagnoses.    Briscoe Deutscher, [place], [place] 07/30/2016   Follow-up: Return in about 4 weeks (around 08/27/2016).

## 2016-07-30 NOTE — Progress Notes (Signed)
Pre visit review using our clinic review tool, if applicable. No additional management support is needed unless otherwise documented below in the visit note. 

## 2016-07-30 NOTE — Patient Instructions (Signed)
It was so nice to meet you!

## 2016-08-14 DIAGNOSIS — R300 Dysuria: Secondary | ICD-10-CM | POA: Diagnosis not present

## 2016-08-14 DIAGNOSIS — R5383 Other fatigue: Secondary | ICD-10-CM | POA: Diagnosis not present

## 2016-08-14 DIAGNOSIS — N390 Urinary tract infection, site not specified: Secondary | ICD-10-CM | POA: Diagnosis not present

## 2016-08-14 DIAGNOSIS — R8299 Other abnormal findings in urine: Secondary | ICD-10-CM | POA: Diagnosis not present

## 2016-08-14 DIAGNOSIS — A499 Bacterial infection, unspecified: Secondary | ICD-10-CM | POA: Diagnosis not present

## 2016-08-14 DIAGNOSIS — Z79899 Other long term (current) drug therapy: Secondary | ICD-10-CM | POA: Diagnosis not present

## 2016-08-17 ENCOUNTER — Emergency Department (HOSPITAL_COMMUNITY)
Admission: EM | Admit: 2016-08-17 | Discharge: 2016-08-17 | Disposition: A | Discharge status: Home / Self Care | Payer: Medicare Other | Attending: Emergency Medicine | Admitting: Emergency Medicine

## 2016-08-17 ENCOUNTER — Ambulatory Visit: Payer: Medicare Other | Admitting: Podiatry

## 2016-08-17 DIAGNOSIS — Z8673 Personal history of transient ischemic attack (TIA), and cerebral infarction without residual deficits: Secondary | ICD-10-CM | POA: Diagnosis not present

## 2016-08-17 DIAGNOSIS — R531 Weakness: Secondary | ICD-10-CM | POA: Insufficient documentation

## 2016-08-17 DIAGNOSIS — I2510 Atherosclerotic heart disease of native coronary artery without angina pectoris: Secondary | ICD-10-CM | POA: Insufficient documentation

## 2016-08-17 DIAGNOSIS — R404 Transient alteration of awareness: Secondary | ICD-10-CM | POA: Diagnosis not present

## 2016-08-17 DIAGNOSIS — G309 Alzheimer's disease, unspecified: Secondary | ICD-10-CM | POA: Diagnosis not present

## 2016-08-17 DIAGNOSIS — Z7982 Long term (current) use of aspirin: Secondary | ICD-10-CM | POA: Diagnosis not present

## 2016-08-17 DIAGNOSIS — I10 Essential (primary) hypertension: Secondary | ICD-10-CM | POA: Insufficient documentation

## 2016-08-17 LAB — COMPREHENSIVE METABOLIC PANEL
ALBUMIN: 4.7 g/dL (ref 3.5–5.0)
ALT: 18 U/L (ref 14–54)
ANION GAP: 9 (ref 5–15)
AST: 20 U/L (ref 15–41)
Alkaline Phosphatase: 68 U/L (ref 38–126)
BILIRUBIN TOTAL: 0.7 mg/dL (ref 0.3–1.2)
BUN: 11 mg/dL (ref 6–20)
CALCIUM: 9.2 mg/dL (ref 8.9–10.3)
CO2: 25 mmol/L (ref 22–32)
Chloride: 104 mmol/L (ref 101–111)
Creatinine, Ser: 0.65 mg/dL (ref 0.44–1.00)
GFR calc Af Amer: >60 mL/min (ref >60)
GFR calc non Af Amer: >60 mL/min (ref >60)
GLUCOSE: 92 mg/dL (ref 65–99)
Potassium: 3.5 mmol/L (ref 3.5–5.1)
Sodium: 138 mmol/L (ref 135–145)
TOTAL PROTEIN: 7.3 g/dL (ref 6.5–8.1)

## 2016-08-17 LAB — CBC WITH DIFFERENTIAL/PLATELET
BASOS PCT: 0 %
Basophils Absolute: 0 10*3/uL (ref 0.0–0.1)
Eosinophils Absolute: 0 10*3/uL (ref 0.0–0.7)
Eosinophils Relative: 0 %
HEMATOCRIT: 47.3 % — AB (ref 36.0–46.0)
HEMOGLOBIN: 16.8 g/dL — AB (ref 12.0–15.0)
LYMPHS ABS: 1.4 10*3/uL (ref 0.7–4.0)
Lymphocytes Relative: 18 %
MCH: 31.2 pg (ref 26.0–34.0)
MCHC: 35.5 g/dL (ref 30.0–36.0)
MCV: 87.9 fL (ref 78.0–100.0)
MONOS PCT: 7 %
Monocytes Absolute: 0.6 10*3/uL (ref 0.1–1.0)
NEUTROS ABS: 6 10*3/uL (ref 1.7–7.7)
NEUTROS PCT: 75 %
Platelets: 175 10*3/uL (ref 150–400)
RBC: 5.38 MIL/uL — AB (ref 3.87–5.11)
RDW: 12.8 % (ref 11.5–15.5)
WBC: 8 10*3/uL (ref 4.0–10.5)

## 2016-08-17 LAB — URINALYSIS, ROUTINE W REFLEX MICROSCOPIC
BILIRUBIN URINE: NEGATIVE
GLUCOSE, UA: NEGATIVE mg/dL
HGB URINE DIPSTICK: NEGATIVE
Ketones, ur: NEGATIVE mg/dL
Leukocytes, UA: NEGATIVE
Nitrite: NEGATIVE
PH: 6 (ref 5.0–8.0)
Protein, ur: NEGATIVE mg/dL
SPECIFIC GRAVITY, URINE: 1.008 (ref 1.005–1.030)

## 2016-08-17 LAB — I-STAT TROPONIN, ED: Troponin i, poc: 0 ng/mL (ref 0.00–0.08)

## 2016-08-17 NOTE — ED Notes (Signed)
Pt ambulated in room.  

## 2016-08-17 NOTE — ED Triage Notes (Signed)
Per EMS, pt is coming from home after daughter called with complaints of generalized weakness. Daughter states pt just started antibiotic treatment 2/24 for UTI. Pt has a hx of alzheimer's and is AO x3 per EMS. Pt lives at home with daughter and ambulates independently.

## 2016-08-17 NOTE — ED Notes (Signed)
rn at bedside collecting labs 

## 2016-08-17 NOTE — ED Notes (Signed)
Bed: [id number] Expected date:  Expected time:  Means of arrival:  Comments: EMS-83yo F, weakness/Hx of UTI and dementia

## 2016-08-17 NOTE — ED Provider Notes (Signed)
[facility] DEPT Provider Note   CSN: [id number] Arrival date & time: 08/17/16  [id number]     History   Chief Complaint Chief Complaint  Patient presents with  . Generalized weakness    HPI Valerie Chen is a 83 y.o. female.  Patient with history of recurrent UTIs, Alzheimer's dementia, high blood pressure, congestive heart failure, CAD, lives at home with daughter but has a caregiver during the day -- presents with exacerbation of weakness and confusion this morning. Patient was recently diagnosed with a urinary tract infection. She was more confused than normal and had urinary frequency and incontinence which is typical of her UTIs. She was started on Keflex which she is taking 2000 mg per day. This morning patient was found down on the floor. It appears that she got out of her bed and her legs gave way. She was found in a sitting position near the bed. She complained of leg pain. Family could not get her to standing. EMS was called and patient was transported to the emergency department. She states that she is feeling much better than she did. She has been standing in the ED without difficulty. She is currently at her baseline per family and caregiver. Patient currently has no complaints. The onset of this condition was acute. The course is constant. Aggravating factors: none. Alleviating factors: none.        Past Medical History:  Diagnosis Date  . Alzheimer disease   . Arthritis   . CAD (coronary artery disease)    Probable CAD, nuclear February, 2012, possible anteroseptal and inferoseptal ischemia but the study was technically difficult  . Chest pain    Hospital February, 2012, nuclear, possible anteroseptal and inferoseptal ischemia, technically difficult, medical therapy  . Dementia    Significant  . Diastolic dysfunction    Echo, February, 2012  . Diastolic dysfunction   . DNR (do not resuscitate)   . Dyslipidemia   . Edema    hospitalizations September, 2013, some  fluid overload  . Ejection fraction    EF 55%, echo, February, 2012  //   EF 55-60%, echo, February 25, 2012 // Echo 12/17: EF 60-65, no RWMA, Gr 1 DD, normal RVSF  . Essential hypertension   . GI bleed    February, 2012 incomplete colonoscopy, hemorrhoids, needs complete colonoscopy  . Hx: UTI (urinary tract infection)   . Hyperlipidemia   . Leg pain, bilateral   . Major depression    2 overdoses in 1975 & 1977  . Migraines    25 year  . Orthostatic hypotension   . Suicide attempt 1985   2 attempts with Valium  . TIA (transient ischemic attack)       ???question of a TIA in February, 2012, carotid Doppler, August 11, 2010 no significant abnormalities    Patient Active Problem List   Diagnosis Date Noted  . NSVT (nonsustained ventricular tachycardia) ([place]) 05/11/2016  . Coarse tremors 05/06/2016  . Syncope 12/29/2015  . Recurrent UTI 02/01/2015  . Essential hypertension 05/30/2013  . Edema   . Psychosis in elderly 03/12/2011  . Coronary artery disease   . Migraine headache   . Diastolic dysfunction   . TIA (transient ischemic attack)   . GI bleed   . Dyslipidemia   . Pain in joints 05/22/2009  . Vitamin D deficiency 01/27/2009  . Anemia 11/21/2008    Past Surgical History:  Procedure Laterality Date  . ABDOMINAL HYSTERECTOMY    . DIAGNOSTIC MAMMOGRAM  12/29/2000  Mammogram, suspicious lesions -BX pending  . TONSILLECTOMY      OB History    No data available       Home Medications    Prior to Admission medications   Medication Sig Start Date End Date Taking? Authorizing Provider  amLODipine (NORVASC) 2.5 MG tablet TAKE 1 TABLET EVERY DAY AS NEEDED 05/31/16   Historical Provider, MD  aspirin EC 81 MG tablet Take 81 mg by mouth daily.     Historical Provider, MD  Cranberry-Vitamin C-Vitamin E (CRANBERRY SUPER STRENGTH PO) Take 2 capsules by mouth 3 (three) times daily after meals.    Historical Provider, MD  cyanocobalamin 500 MCG tablet Take 500 mcg by  mouth daily.     Historical Provider, MD  docusate sodium (COLACE) 100 MG capsule Take 200 mg by mouth daily.    Historical Provider, MD  furosemide (LASIX) 40 MG tablet Take 20 mg by mouth daily.    Historical Provider, MD  OVER THE COUNTER MEDICATION Take 1 tablet by mouth 3 (three) times daily after meals. Medication:  Curamin    Historical Provider, MD  OVER THE COUNTER MEDICATION Take 1 tablet by mouth daily. Medication:  Solgar V    Historical Provider, MD  potassium chloride SA (K-DUR,KLOR-CON) 20 MEQ tablet Take 20 mEq by mouth daily.    Historical Provider, MD  QUEtiapine (SEROQUEL) 25 MG tablet Take 25 mg by mouth daily.     Historical Provider, MD    Family History Family History  Problem Relation Age of Onset  . Heart attack Father     several heart attacks  . Cancer Father     bone  . Arthritis Father   . Suicidality Mother   . Depression Mother   . Heart attack Brother   . Heart disease Brother   . Pneumonia Brother   . Suicidality Son   . Hypertension Daughter   . Depression Other     Most family members    Social History Social History  Substance Use Topics  . Smoking status: Never Smoker  . Smokeless tobacco: Never Used  . Alcohol use No     Allergies   Levaquin [levofloxacin] and Statins   Review of Systems Review of Systems  Constitutional: Negative for fever.  HENT: Negative for rhinorrhea and sore throat.   Eyes: Negative for redness.  Respiratory: Negative for cough.   Cardiovascular: Negative for chest pain.  Gastrointestinal: Negative for abdominal pain, diarrhea, nausea and vomiting.  Genitourinary: Negative for dysuria and hematuria.  Musculoskeletal: Negative for myalgias.  Skin: Negative for rash.  Neurological: Positive for weakness. Negative for headaches.  Psychiatric/Behavioral: Positive for confusion.     Physical Exam Updated Vital Signs BP 160/92 (BP Location: Left Arm)   Pulse 66   Temp 97.6 F (36.4 C) (Oral)   Resp  13   SpO2 97%   Physical Exam  Constitutional: She appears well-developed and well-nourished.  HENT:  Head: Normocephalic and atraumatic.  Mouth/Throat: Oropharynx is clear and moist.  Eyes: Conjunctivae are normal. Right eye exhibits no discharge. Left eye exhibits no discharge.  Neck: Normal range of motion. Neck supple.  Cardiovascular: Normal rate, regular rhythm and normal heart sounds.   No murmur heard. Pulses:      Dorsalis pedis pulses are 2+ on the right side, and 2+ on the left side.  Pulmonary/Chest: Effort normal and breath sounds normal. No respiratory distress. She has no wheezes. She has no rales.  Abdominal: Soft. There is  no tenderness. There is no rebound and no guarding.  Musculoskeletal:       Right hip: She exhibits normal range of motion, normal strength and no tenderness.       Left hip: She exhibits normal range of motion, normal strength and no tenderness.       Right knee: Normal.       Left knee: Normal.       Right ankle: Normal.       Left ankle: Normal.       Cervical back: Normal.       Thoracic back: Normal.       Lumbar back: Normal.  Neurological: She is alert.  Skin: Skin is warm and dry.  Psychiatric: She has a normal mood and affect.  Nursing note and vitals reviewed.    ED Treatments / Results  Labs (all labs ordered are listed, but only abnormal results are displayed) Labs Reviewed  CBC WITH DIFFERENTIAL/PLATELET - Abnormal; Notable for the following:       Result Value   RBC 5.38 (*)    Hemoglobin 16.8 (*)    HCT 47.3 (*)    All other components within normal limits  URINALYSIS, ROUTINE W REFLEX MICROSCOPIC - Abnormal; Notable for the following:    Color, Urine STRAW (*)    All other components within normal limits  COMPREHENSIVE METABOLIC PANEL  I-STAT TROPOININ, ED    EKG  EKG Interpretation  Date/Time:  Tuesday August 17 2016 09:19:16 EST Ventricular Rate:  67 PR Interval:    QRS Duration: 90 QT Interval:  432 QTC  Calculation: 457 R Axis:   55 Text Interpretation:  Sinus rhythm Low voltage, precordial leads No significant change since last tracing Confirmed by [facility] MD, PEDRO ([id number]) on 08/17/2016 9:24:56 AM       Radiology No results found.  Procedures Procedures (including critical care time)  Medications Ordered in ED Medications - No data to display   Initial Impression / Assessment and Plan / ED Course  I have reviewed the triage vital signs and the nursing notes.  Pertinent labs & imaging results that were available during my care of the patient were reviewed by me and considered in my medical decision making (see chart for details).     Patient seen and examined. Work-up initiated.   Vital signs reviewed and are as follows: BP 160/92 (BP Location: Left Arm)   Pulse 66   Temp 97.6 F (36.4 C) (Oral)   Resp 13   SpO2 97%   11:34 AM Patient and family updated. Labs are reassuring. Dr. Leonette Monarch has seen patient. She is able to transfer at her baseline in the room. Patient has great home support. Given findings today, family would like to bring her home. We will discharge. Encouraged PCP follow-up and to 3 days for recheck. Return to emergency department with any worsening symptoms or other concerns.  Final Clinical Impressions(s) / ED Diagnoses   Final diagnoses:  Weakness   Patient with generalized weakness this morning, now resolved. Lab work is reassuring. Troponin and EKG are negative. Patient is able to stand, which she could not this morning. She moves her legs well without pain. Do not suspect fracture. She will return home at this time.  New Prescriptions New Prescriptions   No medications on file     Carlisle Cater, PA-C 08/17/16 [place], MD 08/18/16 0700

## 2016-08-17 NOTE — Discharge Instructions (Signed)
Please read and follow all provided instructions.  Your diagnoses today include:  1. Weakness    Tests performed today include:  Blood counts and electrolytes  Heart test - normal  Urine test - no sign of infection today  Vital signs. See below for your results today.   Medications prescribed:   None  Take any prescribed medications only as directed.  Home care instructions:  Follow any educational materials contained in this packet.  BE VERY CAREFUL not to take multiple medicines containing Tylenol (also called acetaminophen). Doing so can lead to an overdose which can damage your liver and cause liver failure and possibly death.   Follow-up instructions: Please follow-up with your primary care provider in the next 3 days for further evaluation of your symptoms.   Return instructions:   Please return to the Emergency Department if you experience worsening symptoms.   Please return if you have any other emergent concerns.  Additional Information:  Your vital signs today were: BP 149/65    Pulse 62    Temp 97.6 F (36.4 C) (Oral)    Resp 17    SpO2 98%  If your blood pressure (BP) was elevated above 135/85 this visit, please have this repeated by your doctor within one month. --------------

## 2016-08-23 ENCOUNTER — Encounter: Payer: Self-pay | Admitting: *Deleted

## 2016-08-23 ENCOUNTER — Telehealth: Payer: Self-pay | Admitting: *Deleted

## 2016-08-23 ENCOUNTER — Ambulatory Visit (INDEPENDENT_AMBULATORY_CARE_PROVIDER_SITE_OTHER): Payer: Medicare Other | Admitting: Podiatry

## 2016-08-23 DIAGNOSIS — M21969 Unspecified acquired deformity of unspecified lower leg: Secondary | ICD-10-CM | POA: Diagnosis not present

## 2016-08-23 DIAGNOSIS — M21619 Bunion of unspecified foot: Secondary | ICD-10-CM | POA: Diagnosis not present

## 2016-08-23 DIAGNOSIS — M722 Plantar fascial fibromatosis: Secondary | ICD-10-CM

## 2016-08-23 NOTE — Telephone Encounter (Signed)
Noted! Thank you

## 2016-08-23 NOTE — Patient Instructions (Signed)
Seen for left heel pain. Cortisone injection given. Need daily stretch exercise. Return if need custom orthotics or for any other foot pain.

## 2016-08-23 NOTE — Progress Notes (Signed)
SUBJECTIVE: 83 y.o.year old femalepresents with her care taker and her daughter for left heel pain. Daughter requested another injection.  The last injection given on 05/25/16 did well and wishes to have one more.  She was also given injection with Dexamethasone on left heel on 05/12/16.   OBJECTIVE: DERMATOLOGIC EXAMINATION: No abnormal skin lesions.  VASCULAR EXAMINATION OF LOWER LIMBS: All pedal pulses are palpable with normal pulsation.  No edema or erythema noted at the affected left heel. Capillary Filling times within 3 seconds in all digits.  There is no temperature change in the affected area or lower limbs.   NEUROLOGIC EXAMINATION OF THE LOWER LIMBS: All epicritic and tactile sensations grossly intact.  MUSCULOSKELETAL EXAMINATION: Positive for high arch cavus foot. Positive of hallux valgus with bunion bilateral. Pain at plantar lateral left heel.   ASSESSMENT: Acute plantar fasciitis left heel.  PLAN: Reviewed the findings with care taker and her daugher and available treatment options, such as custom orthotics. Patient is already using OTC orthotics. As per request, left heel injected with mixture of 4 mg Dexamethasone, 1 cc of 0.5% Marcaine plain and 1 cc of Xylocaine plain. Patient tolerated well without difficulty. Return if pain continues.

## 2016-08-23 NOTE — Telephone Encounter (Signed)
Spoke to pts daughter Fraser Din, pt has finished her dose of Keflex for UTI and daughter does not see any signs suggesting recurrent UTI. Daughter will get Pneumonia vaccine history from [facility] because she received one dose there but she is not sure which one and she is interested in her receiving the other dose. Daughter declines AWV offer at this time.

## 2016-08-24 ENCOUNTER — Encounter: Payer: Self-pay | Admitting: Podiatry

## 2016-08-25 ENCOUNTER — Ambulatory Visit: Payer: Medicare Other | Admitting: Podiatry

## 2016-08-25 DIAGNOSIS — M7662 Achilles tendinitis, left leg: Secondary | ICD-10-CM | POA: Diagnosis not present

## 2016-08-25 DIAGNOSIS — M2012 Hallux valgus (acquired), left foot: Secondary | ICD-10-CM | POA: Diagnosis not present

## 2016-08-25 DIAGNOSIS — M79672 Pain in left foot: Secondary | ICD-10-CM | POA: Diagnosis not present

## 2016-08-25 DIAGNOSIS — M722 Plantar fascial fibromatosis: Secondary | ICD-10-CM | POA: Diagnosis not present

## 2016-08-30 ENCOUNTER — Encounter: Payer: Self-pay | Admitting: Family Medicine

## 2016-08-30 ENCOUNTER — Ambulatory Visit (INDEPENDENT_AMBULATORY_CARE_PROVIDER_SITE_OTHER): Payer: Medicare Other | Admitting: Family Medicine

## 2016-08-30 VITALS — BP 128/84 | HR 76 | Temp 97.5°F | Ht 64.0 in | Wt 186.6 lb

## 2016-08-30 DIAGNOSIS — N390 Urinary tract infection, site not specified: Secondary | ICD-10-CM

## 2016-08-30 DIAGNOSIS — E559 Vitamin D deficiency, unspecified: Secondary | ICD-10-CM | POA: Diagnosis not present

## 2016-08-30 DIAGNOSIS — F03918 Unspecified dementia, unspecified severity, with other behavioral disturbance: Secondary | ICD-10-CM

## 2016-08-30 DIAGNOSIS — F0391 Unspecified dementia with behavioral disturbance: Secondary | ICD-10-CM

## 2016-08-30 LAB — COMPREHENSIVE METABOLIC PANEL
ALT: 19 U/L (ref 0–35)
AST: 17 U/L (ref 0–37)
Albumin: 4.2 g/dL (ref 3.5–5.2)
Alkaline Phosphatase: 66 U/L (ref 39–117)
BUN: 10 mg/dL (ref 6–23)
CO2: 30 mEq/L (ref 19–32)
Calcium: 9.9 mg/dL (ref 8.4–10.5)
Chloride: 104 mEq/L (ref 96–112)
Creatinine, Ser: 0.72 mg/dL (ref 0.40–1.20)
GFR: 81.65 mL/min (ref >60.00)
Glucose, Bld: 85 mg/dL (ref 70–99)
Potassium: 3.8 mEq/L (ref 3.5–5.1)
Sodium: 141 mEq/L (ref 135–145)
Total Bilirubin: 0.6 mg/dL (ref 0.2–1.2)
Total Protein: 6.8 g/dL (ref 6.0–8.3)

## 2016-08-30 LAB — URINALYSIS, ROUTINE W REFLEX MICROSCOPIC
Bilirubin Urine: NEGATIVE
Hgb urine dipstick: NEGATIVE
Ketones, ur: NEGATIVE
Nitrite: NEGATIVE
RBC / HPF: NONE SEEN (ref 0–?)
Specific Gravity, Urine: 1.015 (ref 1.000–1.030)
Total Protein, Urine: NEGATIVE
Urine Glucose: NEGATIVE
Urobilinogen, UA: 0.2 (ref 0.0–1.0)
pH: 7 (ref 5.0–8.0)

## 2016-08-30 NOTE — Progress Notes (Signed)
Valerie Chen is a 83 y.o. female is here to discuss:  History of Present Illness:   Chief Complaint  Patient presents with  . Follow-up UTI   HPI  1. Recurrent UTI. Recent ER visit for the same. Finished an 8 day course of Keflex. Improved. Symptoms usually nonspecific, was weakness at ER visit. Genito-Urinary ROS: no dysuria, trouble voiding, or hematuria.   2. Vitamin D deficiency. Due for recheck.    3. Psychosis in elderly with behavioral disturbance. Stable. On Seroquel.    Health Maintenance Due  Topic Date Due  . TETANUS/TDAP  10/12/1947    PMHx, SurgHx, SocialHx, FamHx, Medications, and Allergies were reviewed in the Visit Navigator and updated as appropriate.   Patient Active Problem List   Diagnosis Date Noted  . NSVT (nonsustained ventricular tachycardia) ([place]) 05/11/2016  . Coarse tremors 05/06/2016  . Syncope 12/29/2015  . Recurrent UTI 02/01/2015  . Essential hypertension 05/30/2013  . Edema   . Psychosis in elderly 03/12/2011  . Coronary artery disease   . Migraine headache   . Diastolic dysfunction   . TIA (transient ischemic attack)   . GI bleed   . Dyslipidemia   . Pain in joints 05/22/2009  . Vitamin D deficiency 01/27/2009  . Anemia 11/21/2008    Social History  Substance Use Topics  . Smoking status: Never Smoker  . Smokeless tobacco: Never Used  . Alcohol use No    Current Medications and Allergies:   .  amLODipine (NORVASC) 5 MG tablet, Take 2.5 mg by mouth daily as needed., Disp: , Rfl: PRN .  aspirin EC 81 MG tablet, Take 81 mg by mouth daily. , Disp: , Rfl:  .  Cranberry (SM CRANBERRY) 300 MG tablet, Take 300 mg by mouth 2 (two) times daily., Disp: , Rfl:  .  cyanocobalamin 500 MCG tablet, Take 500 mcg by mouth daily. , Disp: , Rfl:  .  docusate sodium (COLACE) 100 MG capsule, Take 200 mg by mouth daily., Disp: , Rfl:  .  furosemide (LASIX) 40 MG tablet, Take 20 mg by mouth daily as needed for edema. , Disp: , Rfl: PRN .   potassium chloride SA (K-DUR,KLOR-CON) 20 MEQ tablet, Take 20 mEq by mouth daily as needed. Taken with Furosemide, Disp: , Rfl: PRN .  QUEtiapine (SEROQUEL) 25 MG tablet, Take 25 mg by mouth every evening. 6pm, Disp: , Rfl:    Allergies  Allergen Reactions  . Levaquin [Levofloxacin] Other (See Comments)    Reaction:  Altered mental status   . Statins Other (See Comments)    Severe myalgias    Review of Systems   Review of Systems  Constitutional: Negative for chills and fever.  HENT: Negative for congestion.   Eyes: Negative for blurred vision.  Respiratory: Negative for cough.   Cardiovascular: Negative for chest pain and palpitations.  Gastrointestinal: Negative for abdominal pain, nausea and vomiting.  Genitourinary: Negative for frequency.  Musculoskeletal: Negative for back pain.  Skin: Negative for rash.  Neurological: Negative for loss of consciousness and headaches.    Vitals:   Vitals:   08/30/16 0818  BP: 128/84  Pulse: 76  Temp: 97.5 F (36.4 C)  TempSrc: Oral  SpO2: 95%  Weight: 186 lb 9.6 oz (84.6 kg)  Height: 5\' 4"  (1.626 m)     Body mass index is 32.03 kg/m.   Physical Exam:    Physical Exam  Constitutional: She appears well-developed and well-nourished. No distress.  HENT:  Head: Normocephalic  and atraumatic.  Neck: Normal range of motion. Neck supple. No thyromegaly present.  Cardiovascular: Normal rate and regular rhythm.   Pulmonary/Chest: Effort normal and breath sounds normal.  Abdominal: Soft. Bowel sounds are normal.  Neurological: She is alert.  Skin: No rash noted.  Psychiatric: She has a normal mood and affect.  Nursing note and vitals reviewed.  Assessment and Plan:   Valerie Chen was seen today for follow-up.  Diagnoses and all orders for this visit:  Recurrent UTI Comments: Feeling better after treatment with Keflex BID x 8 days. Patient's caregiver and daughter would like repeat test today. Orders: -     CBC -      Comprehensive metabolic panel -     Urinalysis, Routine w reflex microscopic -     Urine Culture  Vitamin D deficiency -     VITAMIN D 25 Hydroxy (Vit-D Deficiency, Fractures)  Psychosis in elderly with behavioral disturbance Comments: Okay to refill Seroquel when needed.  . Reviewed expectations re: course of current medical issues. . Discussed self-management of symptoms. . Outlined signs and symptoms indicating need for more acute intervention. . Patient verbalized understanding and all questions were answered. . See orders for this visit as documented in the electronic medical record. . Patient received an After Visit Summary.   Water quality scientist, [place], acting as scribe for Dr. Juleen China.  Briscoe Deutscher, [place], [place] 08/30/2016  Follow-up: No Follow-up on file.

## 2016-08-30 NOTE — Progress Notes (Signed)
Pre visit review using our clinic review tool, if applicable. No additional management support is needed unless otherwise documented below in the visit note. 

## 2016-08-31 ENCOUNTER — Telehealth: Payer: Self-pay | Admitting: Surgical

## 2016-08-31 DIAGNOSIS — M722 Plantar fascial fibromatosis: Secondary | ICD-10-CM | POA: Diagnosis not present

## 2016-08-31 DIAGNOSIS — M7662 Achilles tendinitis, left leg: Secondary | ICD-10-CM | POA: Diagnosis not present

## 2016-08-31 DIAGNOSIS — M2012 Hallux valgus (acquired), left foot: Secondary | ICD-10-CM | POA: Diagnosis not present

## 2016-08-31 DIAGNOSIS — M79672 Pain in left foot: Secondary | ICD-10-CM | POA: Diagnosis not present

## 2016-08-31 LAB — VITAMIN D 25 HYDROXY (VIT D DEFICIENCY, FRACTURES): VITD: 23.6 ng/mL — ABNORMAL LOW (ref 30.00–100.00)

## 2016-08-31 NOTE — Telephone Encounter (Signed)
[facility] called and said that they could not do a CBC off the tube that was drawn. They was able to run everything else. Vein blew so was not able to draw extra tubes.

## 2016-09-01 LAB — URINE CULTURE: Organism ID, Bacteria: NO GROWTH

## 2016-10-12 ENCOUNTER — Telehealth: Payer: Self-pay | Admitting: Surgical

## 2016-10-12 NOTE — Telephone Encounter (Signed)
Spoke with patients daughter and let her know that her mothers handicap form was filled out. It is going to be up front for her to pick up.

## 2016-10-15 ENCOUNTER — Telehealth: Payer: Self-pay | Admitting: Family Medicine

## 2016-10-15 NOTE — Telephone Encounter (Signed)
ROI Fax to Geanie Kenning III MD

## 2016-10-19 ENCOUNTER — Telehealth: Payer: Self-pay | Admitting: Family Medicine

## 2016-10-19 NOTE — Telephone Encounter (Signed)
Rec'd from Haynes Bast III MD PA forward 3 pages to Dr. Briscoe Deutscher

## 2016-10-22 ENCOUNTER — Encounter: Payer: Self-pay | Admitting: Family Medicine

## 2016-10-22 ENCOUNTER — Ambulatory Visit (INDEPENDENT_AMBULATORY_CARE_PROVIDER_SITE_OTHER): Payer: Medicare Other | Admitting: Family Medicine

## 2016-10-22 ENCOUNTER — Telehealth: Payer: Self-pay | Admitting: Family Medicine

## 2016-10-22 VITALS — BP 150/78 | HR 76 | Temp 97.8°F | Ht 64.0 in | Wt 188.0 lb

## 2016-10-22 DIAGNOSIS — R350 Frequency of micturition: Secondary | ICD-10-CM | POA: Diagnosis not present

## 2016-10-22 DIAGNOSIS — R300 Dysuria: Secondary | ICD-10-CM | POA: Diagnosis not present

## 2016-10-22 DIAGNOSIS — F02818 Dementia in other diseases classified elsewhere, unspecified severity, with other behavioral disturbance: Secondary | ICD-10-CM

## 2016-10-22 DIAGNOSIS — F0281 Dementia in other diseases classified elsewhere with behavioral disturbance: Secondary | ICD-10-CM | POA: Diagnosis not present

## 2016-10-22 DIAGNOSIS — G301 Alzheimer's disease with late onset: Secondary | ICD-10-CM

## 2016-10-22 LAB — POCT URINALYSIS DIPSTICK
BILIRUBIN UA: NEGATIVE
Blood, UA: NEGATIVE
GLUCOSE UA: NEGATIVE
KETONES UA: NEGATIVE
LEUKOCYTES UA: NEGATIVE
NITRITE UA: NEGATIVE
Protein, UA: NEGATIVE
Spec Grav, UA: 1.015 (ref 1.010–1.025)
Urobilinogen, UA: 0.2 E.U./dL
pH, UA: 6 (ref 5.0–8.0)

## 2016-10-22 NOTE — Telephone Encounter (Signed)
Valerie Chen requested patient get triage further to determine more details for 2:45pm appointment. Per patient's daughter, mother has hx of reoccurring UTIs. Mother's altered mental status and urinary frequency is common symptoms when patient has UTIs. Patient's daughter confirms no out of the normal symptoms outside of urinary frequency and slight confusion. Patient has history of psychosis.   Patient previously prescribed Keflex for UTIs with relief.   Will provide information to Valerie Chen to determine if further questions or direction is warranted

## 2016-10-22 NOTE — Progress Notes (Signed)
Subjective:    Patient ID: Valerie Chen, female    DOB: 1929/01/10, 83 y.o.   MRN: [id number]  HPI This is an 83 yo female, brought in by her caretaker, who presents today with dysuria x 4 days, urinary frequency and altered mental status x 1 day. This caregiver was not with patient 5-6 days ago and when she came back 4 days ago, states that Valerie Chen was in adult diaper that was very wet. She has since had small amount incontinence. Patient able to independently toilet. Good fluid intake. No fever. No redness or skin breakdown according to caretaker.  Normal bowel movements. Good appetite, no abdominal pain. Has history of recurrent UTI. Last 4 urine cultures without growth.    Has been making outlandish statements- to ride in trunk, taking off shirt instead of pants. Sleeping well. She has history of alzheimer disease. Cooperative behavior.   Past Medical History:  Diagnosis Date  . Alzheimer disease   . Arthritis   . CAD (coronary artery disease)    Probable CAD, nuclear February, 2012, possible anteroseptal and inferoseptal ischemia but the study was technically difficult  . Chest pain    Hospital February, 2012, nuclear, possible anteroseptal and inferoseptal ischemia, technically difficult, medical therapy  . Dementia    Significant  . Diastolic dysfunction    Echo, February, 2012  . Diastolic dysfunction   . DNR (do not resuscitate)   . Dyslipidemia   . Edema    hospitalizations September, 2013, some fluid overload  . Ejection fraction    EF 55%, echo, February, 2012  //   EF 55-60%, echo, February 25, 2012 // Echo 12/17: EF 60-65, no RWMA, Gr 1 DD, normal RVSF  . Essential hypertension   . GI bleed    February, 2012 incomplete colonoscopy, hemorrhoids, needs complete colonoscopy  . Hx: UTI (urinary tract infection)   . Hyperlipidemia   . Leg pain, bilateral   . Major depression    2 overdoses in 1975 & 1977  . Migraines    25 year  . Orthostatic hypotension   .  Suicide attempt ([place]) 1985   2 attempts with Valium  . TIA (transient ischemic attack)       ???question of a TIA in February, 2012, carotid Doppler, August 11, 2010 no significant abnormalities   Past Surgical History:  Procedure Laterality Date  . ABDOMINAL HYSTERECTOMY    . DIAGNOSTIC MAMMOGRAM  12/29/2000   Mammogram, suspicious lesions -BX pending  . TONSILLECTOMY     Family History  Problem Relation Age of Onset  . Heart attack Father     several heart attacks  . Cancer Father     bone  . Arthritis Father   . Suicidality Mother   . Depression Mother   . Heart attack Brother   . Heart disease Brother   . Pneumonia Brother   . Suicidality Son   . Hypertension Daughter   . Depression Other     Most family members   Social History  Substance Use Topics  . Smoking status: Never Smoker  . Smokeless tobacco: Never Used  . Alcohol use No      Review of Systems  Per HPI    Objective:   Physical Exam  Constitutional: She appears well-developed and well-nourished. No distress.  HENT:  Head: Normocephalic and atraumatic.  Eyes: Conjunctivae are normal.  Cardiovascular: Normal rate, regular rhythm and normal heart sounds.   Pulmonary/Chest: Effort normal and breath sounds normal.  Abdominal: Soft. She exhibits no distension. There is no tenderness. There is no rebound and no guarding.  Musculoskeletal: She exhibits no edema.  Neurological: She is alert.  Pleasant. Answers some questions appropriately. Makes tangential statements.   Skin: Skin is warm and dry. She is not diaphoretic.  Psychiatric: She has a normal mood and affect. Her behavior is normal.  Vitals reviewed.     BP (!) 150/78 (BP Location: Right Arm, Patient Position: Sitting, Cuff Size: Normal)   Pulse 76   Temp 97.8 F (36.6 C) (Oral)   Ht 5\' 4"  (1.626 m)   Wt 188 lb (85.3 kg)   SpO2 94%   BMI 32.27 kg/m  Wt Readings from Last 3 Encounters:  10/22/16 188 lb (85.3 kg)  08/30/16 186 lb  9.6 oz (84.6 kg)  07/30/16 189 lb 3.2 oz (85.8 kg)   BP Readings from Last 3 Encounters:  10/22/16 (!) 150/78  08/30/16 128/84  08/17/16 151/68   Results for orders placed or performed in visit on 10/22/16  POC Urinalysis Dipstick  Result Value Ref Range   Color, UA Yellow    Clarity, UA Clear    Glucose, UA Negative    Bilirubin, UA Negative    Ketones, UA Negative    Spec Grav, UA 1.015 1.010 - 1.025   Blood, UA Negative    pH, UA 6.0 5.0 - 8.0   Protein, UA Negative    Urobilinogen, UA 0.2 0.2 or 1.0 E.U./dL   Nitrite, UA Negative    Leukocytes, UA Negative Negative       Assessment & Plan:  1. Urinary frequency - urinalysis normal, good hydration with SG 1.015 - POC Urinalysis Dipstick  2. Dysuria - likely irritation related to adult diaper use, discussed leaving off adult incontinence products as possible to allow increased air flow.  - Follow up if no improvement in 4-5 days or if she develops fever/chills, back pain  3. Late onset Alzheimer's disease with behavioral disturbance - known history Alzheimer's, no intervention needed at this time   Clarene Reamer, FNP-BC  [facility] [place], [place]  10/23/2016 6:56 AM

## 2016-10-22 NOTE — Telephone Encounter (Signed)
Patient's caregiver came in to schedule an acute appointment for patient today at 2:45pm. The patient normally sees Dr. Briscoe Deutscher. Patient has been Incontinent and Confused.

## 2016-10-22 NOTE — Patient Instructions (Signed)
Everything looks good in urine  Continue what you are doing  Let us know if anything changes

## 2016-10-23 DIAGNOSIS — F0280 Dementia in other diseases classified elsewhere without behavioral disturbance: Secondary | ICD-10-CM | POA: Diagnosis not present

## 2016-10-23 DIAGNOSIS — N390 Urinary tract infection, site not specified: Secondary | ICD-10-CM | POA: Diagnosis not present

## 2016-10-23 DIAGNOSIS — A499 Bacterial infection, unspecified: Secondary | ICD-10-CM | POA: Diagnosis not present

## 2016-10-23 DIAGNOSIS — G309 Alzheimer's disease, unspecified: Secondary | ICD-10-CM | POA: Diagnosis not present

## 2016-12-06 ENCOUNTER — Ambulatory Visit (INDEPENDENT_AMBULATORY_CARE_PROVIDER_SITE_OTHER): Payer: Medicare Other | Admitting: Family Medicine

## 2016-12-06 ENCOUNTER — Encounter: Payer: Self-pay | Admitting: Family Medicine

## 2016-12-06 VITALS — BP 132/78 | HR 67 | Temp 98.4°F | Ht 64.0 in | Wt 185.8 lb

## 2016-12-06 DIAGNOSIS — L989 Disorder of the skin and subcutaneous tissue, unspecified: Secondary | ICD-10-CM

## 2016-12-06 DIAGNOSIS — N390 Urinary tract infection, site not specified: Secondary | ICD-10-CM | POA: Diagnosis not present

## 2016-12-06 DIAGNOSIS — F0391 Unspecified dementia with behavioral disturbance: Secondary | ICD-10-CM | POA: Diagnosis not present

## 2016-12-06 DIAGNOSIS — T148XXA Other injury of unspecified body region, initial encounter: Secondary | ICD-10-CM

## 2016-12-06 DIAGNOSIS — F03918 Unspecified dementia, unspecified severity, with other behavioral disturbance: Secondary | ICD-10-CM

## 2016-12-06 MED ORDER — NYSTATIN-TRIAMCINOLONE 100000-0.1 UNIT/GM-% EX OINT: 1.0000 "application " | TOPICAL_OINTMENT | Freq: Two times a day (BID) | CUTANEOUS | 2 refills | Status: DC

## 2016-12-06 MED ORDER — QUETIAPINE FUMARATE 25 MG PO TABS: 25.0000 mg | ORAL_TABLET | Freq: Every evening | ORAL | 2 refills | Status: DC

## 2016-12-06 NOTE — Progress Notes (Signed)
Valerie Chen is a 83 y.o. female is here for follow up.  History of Present Illness:   Water quality scientist, CMA, acting as scribe for Dr. Juleen China.  CC:  Patient is here for follow up today.  Doing well.  Needs refill on Seroquel.  Patient has bruised, swollen area on left leg.  Not painful.  No other acute concerns or complaints.  HPI:  1. Psychosis in elderly with behavioral disturbance. No concerns. Needs refill of Seroquel.   2. Recurrent UTI. Unhappy with last visit. Udip completed that was negative. No Rx provided. Next day, worse and treated at [facility] with positive UA/ micro and subsequent UCx.   3. Skin lesion of left lower extremity. Left hip. Unknown length of time. No pain or itch. Treating with Hydrocortisone.    4. Bruise. Left lower leg. Pain with palpation. No known trauma.   Health Maintenance Due  Topic Date Due  . TETANUS/TDAP  10/12/1947   PMHx, SurgHx, SocialHx, FamHx, Medications, and Allergies were reviewed in the Visit Navigator and updated as appropriate.   Patient Active Problem List   Diagnosis Date Noted  . NSVT (nonsustained ventricular tachycardia) ([place]) 05/11/2016  . Coarse tremors 05/06/2016  . Syncope 12/29/2015  . Recurrent UTI 02/01/2015  . Essential hypertension 05/30/2013  . Edema   . Psychosis in elderly 03/12/2011  . Coronary artery disease   . Migraine headache   . Diastolic dysfunction   . TIA (transient ischemic attack)   . GI bleed   . Dyslipidemia   . Pain in joints 05/22/2009  . Vitamin D deficiency 01/27/2009  . Anemia 11/21/2008   Social History  Substance Use Topics  . Smoking status: Never Smoker  . Smokeless tobacco: Never Used  . Alcohol use No   Current Medications and Allergies:   .  amLODipine (NORVASC) 5 MG tablet, Take 2.5 mg by mouth daily as needed., Disp: , Rfl:  .  aspirin EC 81 MG tablet, Take 81 mg by mouth daily. , Disp: , Rfl:  .  Cholecalciferol (VITAMIN D-3) 1000 units CAPS, Take by mouth., Disp: , Rfl:  .   Cranberry (SM CRANBERRY) 300 MG tablet, Take 300 mg by mouth 2 (two) times daily., Disp: , Rfl:  .  cyanocobalamin 500 MCG tablet, Take 500 mcg by mouth daily. , Disp: , Rfl:  .  docusate sodium (COLACE) 100 MG capsule, Take 200 mg by mouth daily., Disp: , Rfl:  .  furosemide (LASIX) 40 MG tablet, Take 20 mg by mouth daily as needed for edema. , Disp: , Rfl:  .  potassium chloride SA (K-DUR,KLOR-CON) 20 MEQ tablet, Take 20 mEq by mouth daily as needed. Taken with Furosemide, Disp: , Rfl:  .  QUEtiapine (SEROQUEL) 25 MG tablet, Take 1 tablet (25 mg total) by mouth every evening. 6pm, Disp: 90 tablet, Rfl: 2  Allergies  Allergen Reactions  . Levaquin [Levofloxacin] Other (See Comments)    Reaction:  Altered mental status   . Statins Other (See Comments)    Severe myalgias   Review of Systems   Review of Systems  Constitutional: Negative for chills and fever.  HENT: Negative for congestion, ear pain and sore throat.   Eyes: Negative for blurred vision and pain.  Respiratory: Negative for cough and shortness of breath.   Cardiovascular: Negative for chest pain and palpitations.  Gastrointestinal: Negative for abdominal pain, nausea and vomiting.  Genitourinary: Negative for dysuria, frequency, hematuria and urgency.  Musculoskeletal: Negative for back pain and  neck pain.  Skin: Negative for rash.  Neurological: Negative for dizziness, loss of consciousness and headaches.  Endo/Heme/Allergies: Does not bruise/bleed easily.   Vitals:   Vitals:   12/06/16 1048  BP: 132/78  Pulse: 67  Temp: 98.4 F (36.9 C)  TempSrc: Oral  SpO2: 96%  Weight: 185 lb 12.8 oz (84.3 kg)  Height: 5\' 4"  (1.626 m)     Body mass index is 31.89 kg/m.   Physical Exam:   Physical Exam  Constitutional: She appears well-developed and well-nourished.  HENT:  Head: Normocephalic and atraumatic.  Eyes: EOM are normal. Pupils are equal, round, and reactive to light.  Neck: Normal range of motion. Neck  supple.  Cardiovascular: Normal rate, regular rhythm and intact distal pulses.   Pulmonary/Chest: Effort normal and breath sounds normal.  Abdominal: Soft. Bowel sounds are normal.  Skin:  (1) 3 mm circular red raised lesion on left hip without signs of infection or surrounding skin changes. (2) Healing bruise to left lower shin.  Psychiatric:  Pleasantly demented.  Nursing note and vitals reviewed.    Assessment and Plan:   Kasiya was seen today for follow-up.  Diagnoses and all orders for this visit:  Psychosis in elderly with behavioral disturbance Comments: Refill Seroquel. No changes. Orders: -     QUEtiapine (SEROQUEL) 25 MG tablet; Take 1 tablet (25 mg total) by mouth every evening. 6pm  Recurrent UTI Comments: Discussed recent visit with daughter. Will educate providers to send UA with micro and reflex to culture. Low threshold for treating.  Skin lesion of left lower extremity Comments: Looks irritated. Caregiver will watch. Rx nystatin to be used along with OTC hydrocortisone. Orders: -     nystatin-triamcinolone ointment (MYCOLOG); Apply 1 application topically 2 (two) times daily.  Bruise Comments: Healing. No red flags.    . Reviewed expectations re: course of current medical issues. . Discussed self-management of symptoms. . Outlined signs and symptoms indicating need for more acute intervention. . Patient verbalized understanding and all questions were answered. Marland Kitchen Health Maintenance issues including appropriate healthy diet, exercise, and smoking avoidance were discussed with patient. . See orders for this visit as documented in the electronic medical record. . Patient received an After Visit Summary.  CMA served as Education administrator during this visit. History, Physical, and Plan performed by medical provider. The above documentation has been reviewed and is accurate and complete. Briscoe Deutscher, D.O.  Briscoe Deutscher, DO [place], [place] 12/06/2016  Future  Appointments Date Time Provider [place]  03/01/2017 10:45 AM Briscoe Deutscher, DO [facility] None

## 2016-12-09 ENCOUNTER — Ambulatory Visit (INDEPENDENT_AMBULATORY_CARE_PROVIDER_SITE_OTHER): Payer: Medicare Other | Admitting: Family Medicine

## 2016-12-09 ENCOUNTER — Other Ambulatory Visit: Payer: Medicare Other

## 2016-12-09 DIAGNOSIS — R300 Dysuria: Secondary | ICD-10-CM

## 2016-12-09 LAB — URINALYSIS, ROUTINE W REFLEX MICROSCOPIC
Bilirubin Urine: NEGATIVE
Hgb urine dipstick: NEGATIVE
Ketones, ur: NEGATIVE
Nitrite: NEGATIVE
RBC / HPF: NONE SEEN (ref 0–?)
Specific Gravity, Urine: 1.01 (ref 1.000–1.030)
Total Protein, Urine: NEGATIVE
Urine Glucose: NEGATIVE
Urobilinogen, UA: 0.2 (ref 0.0–1.0)
pH: 7.5 (ref 5.0–8.0)

## 2016-12-09 LAB — POCT URINALYSIS DIPSTICK
Bilirubin, UA: NEGATIVE
Blood, UA: NEGATIVE
Glucose, UA: NEGATIVE
Ketones, UA: NEGATIVE
Leukocytes, UA: NEGATIVE
Nitrite, UA: NEGATIVE
Protein, UA: NEGATIVE
Spec Grav, UA: 1.01 (ref 1.010–1.025)
Urobilinogen, UA: 0.2 E.U./dL
pH, UA: 7.5 (ref 5.0–8.0)

## 2016-12-09 MED ORDER — CEPHALEXIN 500 MG PO CAPS: 500.0000 mg | ORAL_CAPSULE | Freq: Three times a day (TID) | ORAL | 0 refills | Status: DC

## 2016-12-09 NOTE — Progress Notes (Signed)
Results for orders placed or performed in visit on 12/09/16  Urinalysis, Routine w reflex microscopic  Result Value Ref Range   Color, Urine YELLOW Yellow;Lt. Yellow   APPearance CLEAR Clear   Specific Gravity, Urine 1.010 1.000 - 1.030   pH 7.5 5.0 - 8.0   Total Protein, Urine NEGATIVE Negative   Urine Glucose NEGATIVE Negative   Ketones, ur NEGATIVE Negative   Bilirubin Urine NEGATIVE Negative   Hgb urine dipstick NEGATIVE Negative   Urobilinogen, UA 0.2 0.0 - 1.0   Leukocytes, UA TRACE (A) Negative   Nitrite NEGATIVE Negative   WBC, UA 0-2/hpf 0-2/hpf   RBC / HPF none seen 0-2/hpf   Squamous Epithelial / LPF Rare(0-4/hpf) Rare(0-4/hpf)   Diagnoses and all orders for this visit:  Dysuria -     POCT urinalysis dipstick -     cephALEXin (KEFLEX) 500 MG capsule; Take 1 capsule (500 mg total) by mouth 3 (three) times daily. -     Urine Microscopic -     Urinalysis, Routine w reflex microscopic   Briscoe Deutscher, D.O. [place], [facility]

## 2016-12-14 ENCOUNTER — Ambulatory Visit: Payer: Medicare Other | Admitting: Family Medicine

## 2016-12-15 NOTE — Progress Notes (Signed)
Not seen by me that day. I accepted her for UA and UCx since I saw her 2 days prior. EW

## 2016-12-16 ENCOUNTER — Telehealth: Payer: Self-pay | Admitting: Family Medicine

## 2016-12-16 NOTE — Telephone Encounter (Signed)
Notified patients daughter of lab results and mailed them to her.

## 2016-12-16 NOTE — Telephone Encounter (Signed)
Patient's daughter Fraser Din came into the office stating she was upset and needed to speak to nurse about patient's lab results. Fraser Din did not want to stay to wait on provider and would like a call as soon as possible to discuss lab results from UTI for patient.

## 2016-12-21 ENCOUNTER — Telehealth: Payer: Self-pay | Admitting: Family Medicine

## 2016-12-21 DIAGNOSIS — A499 Bacterial infection, unspecified: Secondary | ICD-10-CM | POA: Diagnosis not present

## 2016-12-21 DIAGNOSIS — G309 Alzheimer's disease, unspecified: Secondary | ICD-10-CM | POA: Diagnosis not present

## 2016-12-21 DIAGNOSIS — R310 Gross hematuria: Secondary | ICD-10-CM | POA: Diagnosis not present

## 2016-12-21 DIAGNOSIS — N390 Urinary tract infection, site not specified: Secondary | ICD-10-CM | POA: Diagnosis not present

## 2016-12-21 NOTE — Telephone Encounter (Signed)
Daughter stopped by the office to talk about patient. She states that her mother was seen this morning at [facility] for another UTI. She stated that this was the worst one that she was aware of. She had bleeding and very dark urine.  She also wanted to discuss her  Concern that the keflex that was ordered on her 06/21 visit was not the correct medication and that it could have been avoided if a culture had been ordered. She states that they are very happy with Dr. Juleen China as a provider and just wanted to let her know what was going on with her mother.

## 2016-12-25 DIAGNOSIS — M79604 Pain in right leg: Secondary | ICD-10-CM | POA: Diagnosis not present

## 2017-01-04 DIAGNOSIS — G309 Alzheimer's disease, unspecified: Secondary | ICD-10-CM | POA: Diagnosis not present

## 2017-01-04 DIAGNOSIS — H6123 Impacted cerumen, bilateral: Secondary | ICD-10-CM | POA: Diagnosis not present

## 2017-01-04 DIAGNOSIS — A499 Bacterial infection, unspecified: Secondary | ICD-10-CM | POA: Diagnosis not present

## 2017-01-04 DIAGNOSIS — N390 Urinary tract infection, site not specified: Secondary | ICD-10-CM | POA: Diagnosis not present

## 2017-01-17 DIAGNOSIS — M7662 Achilles tendinitis, left leg: Secondary | ICD-10-CM | POA: Diagnosis not present

## 2017-01-17 DIAGNOSIS — M722 Plantar fascial fibromatosis: Secondary | ICD-10-CM | POA: Diagnosis not present

## 2017-01-17 DIAGNOSIS — M79672 Pain in left foot: Secondary | ICD-10-CM | POA: Diagnosis not present

## 2017-01-18 ENCOUNTER — Ambulatory Visit (INDEPENDENT_AMBULATORY_CARE_PROVIDER_SITE_OTHER): Payer: Medicare Other | Admitting: Podiatry

## 2017-01-18 ENCOUNTER — Encounter: Payer: Self-pay | Admitting: Podiatry

## 2017-01-18 DIAGNOSIS — M722 Plantar fascial fibromatosis: Secondary | ICD-10-CM

## 2017-01-18 DIAGNOSIS — M216X1 Other acquired deformities of right foot: Secondary | ICD-10-CM | POA: Diagnosis not present

## 2017-01-18 DIAGNOSIS — M21969 Unspecified acquired deformity of unspecified lower leg: Secondary | ICD-10-CM

## 2017-01-18 DIAGNOSIS — R262 Difficulty in walking, not elsewhere classified: Secondary | ICD-10-CM

## 2017-01-18 DIAGNOSIS — M216X2 Other acquired deformities of left foot: Secondary | ICD-10-CM | POA: Diagnosis not present

## 2017-01-18 NOTE — Patient Instructions (Signed)
Seen for pain in left heel. Cortisone injection given as per request. Both feet casted for Orthotics. Will call when they are ready.

## 2017-01-18 NOTE — Progress Notes (Signed)
Pain under left heel for 3 days.  SUBJECTIVE: 83 y.o.year old femalepresents with her care taker for left heel pain duration of 3 days. Care taker requested another cortisone injection and custom orthotics..  She received cortisone injections on 08/24/15, 05/25/16, and 05/12/16.  Past injections gave her good relief.    OBJECTIVE: DERMATOLOGIC EXAMINATION: No abnormal skin lesions.  VASCULAR EXAMINATION OF LOWER LIMBS: All pedal pulses are palpable with normal pulsation.  No edema or erythema noted at the affected left heel. Capillary Filling times within 3 seconds in all digits.  There is no temperature change in the affected area or lower limbs.   NEUROLOGIC EXAMINATION OF THE LOWER LIMBS: All epicritic and tactile sensations grossly intact.  MUSCULOSKELETAL EXAMINATION: Positive for high arch cavus foot. Positive of hallux valgus with bunion bilateral. Pain at plantar lateral left heel.   ASSESSMENT: Recurring plantar fasciitis left heel.  PLAN: Reviewed the findings with care taker and available treatment options, such as custom orthotics. As per request, left heel injected with mixture of 4 mg Dexamethasone, 1 cc of 0.5% Marcaine plain and 1 cc of Xylocaine plain. Patient tolerated well without difficulty.  Both feet casted for Orthotics.  Will call when orthotics are ready.

## 2017-01-21 ENCOUNTER — Observation Stay (HOSPITAL_BASED_OUTPATIENT_CLINIC_OR_DEPARTMENT_OTHER)
Admission: EM | Admit: 2017-01-21 | Discharge: 2017-01-22 | Disposition: A | Discharge status: Home / Self Care | Payer: Medicare Other | Source: Home / Self Care | Attending: Emergency Medicine | Admitting: Emergency Medicine

## 2017-01-21 ENCOUNTER — Emergency Department (HOSPITAL_COMMUNITY): Payer: Medicare Other

## 2017-01-21 ENCOUNTER — Emergency Department (HOSPITAL_COMMUNITY)
Admission: EM | Admit: 2017-01-21 | Discharge: 2017-01-21 | Disposition: A | Discharge status: Home / Self Care | Payer: Medicare Other | Attending: Emergency Medicine | Admitting: Emergency Medicine

## 2017-01-21 ENCOUNTER — Telehealth: Payer: Self-pay | Admitting: Family Medicine

## 2017-01-21 ENCOUNTER — Encounter (HOSPITAL_COMMUNITY): Payer: Self-pay | Admitting: *Deleted

## 2017-01-21 DIAGNOSIS — R55 Syncope and collapse: Secondary | ICD-10-CM

## 2017-01-21 DIAGNOSIS — R1907 Generalized intra-abdominal and pelvic swelling, mass and lump: Secondary | ICD-10-CM | POA: Diagnosis not present

## 2017-01-21 DIAGNOSIS — R1084 Generalized abdominal pain: Secondary | ICD-10-CM | POA: Diagnosis not present

## 2017-01-21 DIAGNOSIS — I10 Essential (primary) hypertension: Secondary | ICD-10-CM | POA: Insufficient documentation

## 2017-01-21 DIAGNOSIS — R079 Chest pain, unspecified: Secondary | ICD-10-CM | POA: Diagnosis not present

## 2017-01-21 DIAGNOSIS — R1900 Intra-abdominal and pelvic swelling, mass and lump, unspecified site: Secondary | ICD-10-CM | POA: Diagnosis not present

## 2017-01-21 DIAGNOSIS — Z515 Encounter for palliative care: Secondary | ICD-10-CM

## 2017-01-21 DIAGNOSIS — M549 Dorsalgia, unspecified: Secondary | ICD-10-CM | POA: Diagnosis not present

## 2017-01-21 DIAGNOSIS — I2510 Atherosclerotic heart disease of native coronary artery without angina pectoris: Secondary | ICD-10-CM | POA: Insufficient documentation

## 2017-01-21 DIAGNOSIS — Z7982 Long term (current) use of aspirin: Secondary | ICD-10-CM

## 2017-01-21 DIAGNOSIS — M5126 Other intervertebral disc displacement, lumbar region: Secondary | ICD-10-CM | POA: Diagnosis not present

## 2017-01-21 DIAGNOSIS — Z79899 Other long term (current) drug therapy: Secondary | ICD-10-CM

## 2017-01-21 DIAGNOSIS — R1032 Left lower quadrant pain: Secondary | ICD-10-CM | POA: Diagnosis not present

## 2017-01-21 DIAGNOSIS — R031 Nonspecific low blood-pressure reading: Secondary | ICD-10-CM | POA: Diagnosis not present

## 2017-01-21 DIAGNOSIS — M5489 Other dorsalgia: Secondary | ICD-10-CM | POA: Diagnosis not present

## 2017-01-21 DIAGNOSIS — G309 Alzheimer's disease, unspecified: Secondary | ICD-10-CM | POA: Insufficient documentation

## 2017-01-21 DIAGNOSIS — R1904 Left lower quadrant abdominal swelling, mass and lump: Secondary | ICD-10-CM | POA: Diagnosis not present

## 2017-01-21 DIAGNOSIS — F0390 Unspecified dementia without behavioral disturbance: Secondary | ICD-10-CM | POA: Diagnosis present

## 2017-01-21 LAB — COMPREHENSIVE METABOLIC PANEL
ALBUMIN: 3.9 g/dL (ref 3.5–5.0)
ALT: 19 U/L (ref 14–54)
AST: 23 U/L (ref 15–41)
Alkaline Phosphatase: 64 U/L (ref 38–126)
Anion gap: 10 (ref 5–15)
BILIRUBIN TOTAL: 0.6 mg/dL (ref 0.3–1.2)
BUN: 10 mg/dL (ref 6–20)
CHLORIDE: 106 mmol/L (ref 101–111)
CO2: 25 mmol/L (ref 22–32)
CREATININE: 0.73 mg/dL (ref 0.44–1.00)
Calcium: 9.7 mg/dL (ref 8.9–10.3)
GFR calc Af Amer: >60 mL/min (ref >60)
GLUCOSE: 85 mg/dL (ref 65–99)
Potassium: 3.8 mmol/L (ref 3.5–5.1)
Sodium: 141 mmol/L (ref 135–145)
Total Protein: 6.8 g/dL (ref 6.5–8.1)

## 2017-01-21 LAB — CBC WITH DIFFERENTIAL/PLATELET
BASOS ABS: 0 10*3/uL (ref 0.0–0.1)
BASOS PCT: 1 %
Eosinophils Absolute: 0 10*3/uL (ref 0.0–0.7)
Eosinophils Relative: 0 %
HEMATOCRIT: 42.5 % (ref 36.0–46.0)
Hemoglobin: 15 g/dL (ref 12.0–15.0)
LYMPHS PCT: 44 %
Lymphs Abs: 1.6 10*3/uL (ref 0.7–4.0)
MCH: 31.8 pg (ref 26.0–34.0)
MCHC: 35.3 g/dL (ref 30.0–36.0)
MCV: 90 fL (ref 78.0–100.0)
Monocytes Absolute: 0.3 10*3/uL (ref 0.1–1.0)
Monocytes Relative: 9 %
NEUTROS ABS: 1.8 10*3/uL (ref 1.7–7.7)
Neutrophils Relative %: 46 %
PLATELETS: 185 10*3/uL (ref 150–400)
RBC: 4.72 MIL/uL (ref 3.87–5.11)
RDW: 12.7 % (ref 11.5–15.5)
WBC: 3.8 10*3/uL — AB (ref 4.0–10.5)

## 2017-01-21 LAB — I-STAT TROPONIN, ED
TROPONIN I, POC: 0.01 ng/mL (ref 0.00–0.08)
TROPONIN I, POC: 0.01 ng/mL (ref 0.00–0.08)

## 2017-01-21 LAB — URINALYSIS, ROUTINE W REFLEX MICROSCOPIC
BILIRUBIN URINE: NEGATIVE
Glucose, UA: NEGATIVE mg/dL
Hgb urine dipstick: NEGATIVE
Ketones, ur: NEGATIVE mg/dL
Leukocytes, UA: NEGATIVE
NITRITE: NEGATIVE
PH: 8 (ref 5.0–8.0)
Protein, ur: NEGATIVE mg/dL
SPECIFIC GRAVITY, URINE: 1.003 — AB (ref 1.005–1.030)

## 2017-01-21 LAB — I-STAT CG4 LACTIC ACID, ED: Lactic Acid, Venous: 1.45 mmol/L (ref 0.5–1.9)

## 2017-01-21 MED ORDER — IOPAMIDOL (ISOVUE-370) INJECTION 76%
INTRAVENOUS | Status: AC
  Filled 2017-01-21: qty 100

## 2017-01-21 MED ORDER — MORPHINE SULFATE (PF) 2 MG/ML IV SOLN: 0.5000 mg | INTRAVENOUS | Status: DC | PRN

## 2017-01-21 MED ORDER — DOCUSATE SODIUM 100 MG PO CAPS
200.0000 mg | ORAL_CAPSULE | Freq: Every day | ORAL | Status: DC
  Administered 2017-01-22: 200 mg via ORAL
  Filled 2017-01-21: qty 2

## 2017-01-21 MED ORDER — QUETIAPINE FUMARATE 25 MG PO TABS
25.0000 mg | ORAL_TABLET | Freq: Every evening | ORAL | Status: DC
  Filled 2017-01-21: qty 1

## 2017-01-21 MED ORDER — HYDROCODONE-ACETAMINOPHEN 5-325 MG PO TABS: 1.0000 | ORAL_TABLET | Freq: Four times a day (QID) | ORAL | 0 refills | Status: DC | PRN

## 2017-01-21 MED ORDER — FENTANYL CITRATE (PF) 100 MCG/2ML IJ SOLN
25.0000 ug | Freq: Once | INTRAMUSCULAR | Status: AC
  Administered 2017-01-21: 25 ug via INTRAVENOUS
  Filled 2017-01-21: qty 2

## 2017-01-21 MED ORDER — SODIUM CHLORIDE 0.9 % IV BOLUS (SEPSIS)
1000.0000 mL | Freq: Once | INTRAVENOUS | Status: AC
  Administered 2017-01-21: 1000 mL via INTRAVENOUS

## 2017-01-21 MED ORDER — IOPAMIDOL (ISOVUE-300) INJECTION 61%
INTRAVENOUS | Status: DC
Start: 2017-01-21 — End: 2017-01-21
  Filled 2017-01-21: qty 100

## 2017-01-21 MED ORDER — IOPAMIDOL (ISOVUE-300) INJECTION 61%
100.0000 mL | Freq: Once | INTRAVENOUS | Status: AC | PRN
  Administered 2017-01-21: 100 mL via INTRAVENOUS

## 2017-01-21 NOTE — ED Notes (Signed)
Bed: RJ73 Expected date:  Expected time:  Means of arrival:  Comments: EMS-83 yo F. back pain, recent UTI treatment   Driscilla Moats, RN 01/21/17 0930

## 2017-01-21 NOTE — ED Provider Notes (Signed)
[facility] DEPT Provider Note   CSN: [id number] Arrival date & time: 01/21/17  0917     History   Chief Complaint Chief Complaint  Patient presents with  . Back Pain  . Flank Pain  . DNR    YELLOW COPY    HPI Valerie Chen is a 83 y.o. female hx of CAD, Diastolic dysfunction, UTI here presenting with flank pain, chest pain. Patient was recently diagnosed with urinary tract infection initially finished a course of Bactrim and then finish her course of amoxicillin. Patient lives at home with her daughter and was complaining of bilateral flank pain since this morning and was in too much pain to walk. After she got to the ER she states that her chest is hurting as well in the substernal area with no radiation. Patient demented and unable to give much history.  The history is provided by the patient and the EMS personnel.   Level V caveat- dementia   Past Medical History:  Diagnosis Date  . Alzheimer disease   . Arthritis   . CAD (coronary artery disease)    Probable CAD, nuclear February, 2012, possible anteroseptal and inferoseptal ischemia but the study was technically difficult  . Chest pain    Hospital February, 2012, nuclear, possible anteroseptal and inferoseptal ischemia, technically difficult, medical therapy  . Dementia    Significant  . Diastolic dysfunction    Echo, February, 2012  . Diastolic dysfunction   . DNR (do not resuscitate)   . Dyslipidemia   . Edema    hospitalizations September, 2013, some fluid overload  . Ejection fraction    EF 55%, echo, February, 2012  //   EF 55-60%, echo, February 25, 2012 // Echo 12/17: EF 60-65, no RWMA, Gr 1 DD, normal RVSF  . Essential hypertension   . GI bleed    February, 2012 incomplete colonoscopy, hemorrhoids, needs complete colonoscopy  . Hx: UTI (urinary tract infection)   . Hyperlipidemia   . Leg pain, bilateral   . Major depression    2 overdoses in 1975 & 1977  . Migraines    25 year  . Orthostatic  hypotension   . Suicide attempt ([place]) 1985   2 attempts with Valium  . TIA (transient ischemic attack)       ???question of a TIA in February, 2012, carotid Doppler, August 11, 2010 no significant abnormalities    Patient Active Problem List   Diagnosis Date Noted  . NSVT (nonsustained ventricular tachycardia) ([place]) 05/11/2016  . Coarse tremors 05/06/2016  . Syncope 12/29/2015  . Recurrent UTI 02/01/2015  . Essential hypertension 05/30/2013  . Edema   . Psychosis in elderly 03/12/2011  . Coronary artery disease   . Migraine headache   . Diastolic dysfunction   . TIA (transient ischemic attack)   . GI bleed   . Dyslipidemia   . Pain in joints 05/22/2009  . Vitamin D deficiency 01/27/2009  . Anemia 11/21/2008    Past Surgical History:  Procedure Laterality Date  . ABDOMINAL HYSTERECTOMY    . DIAGNOSTIC MAMMOGRAM  12/29/2000   Mammogram, suspicious lesions -BX pending  . TONSILLECTOMY      OB History    No data available       Home Medications    Prior to Admission medications   Medication Sig Start Date End Date Taking? Authorizing Provider  acetaminophen (TYLENOL) 500 MG tablet Take 1,000 mg by mouth every 6 (six) hours as needed for moderate pain.   Yes  [provider]  amLODipine (NORVASC) 5 MG tablet Take 2.5 mg by mouth daily as needed.   Yes [provider]  aspirin EC 81 MG tablet Take 81 mg by mouth daily.    Yes [provider]  Cholecalciferol (VITAMIN D-3) 1000 units CAPS Take 1,000 Units by mouth daily.    Yes [provider]  Cranberry (SM CRANBERRY) 300 MG tablet Take 300 mg by mouth 3 (three) times daily.    Yes [provider]  cyanocobalamin 500 MCG tablet Take 500 mcg by mouth daily.    Yes [provider]  docusate sodium (COLACE) 100 MG capsule Take 200 mg by mouth daily.   Yes [provider]  furosemide (LASIX) 40 MG tablet Take 20 mg by mouth daily as needed for edema.    Yes  [provider]  potassium chloride SA (K-DUR,KLOR-CON) 20 MEQ tablet Take 20 mEq by mouth daily as needed. Taken with Furosemide   Yes [provider]  QUEtiapine (SEROQUEL) 25 MG tablet Take 1 tablet (25 mg total) by mouth every evening. 6pm 12/06/16  Yes Briscoe Deutscher, DO  cephALEXin (KEFLEX) 500 MG capsule Take 1 capsule (500 mg total) by mouth 3 (three) times daily. Patient not taking: Reported on 01/21/2017 12/09/16   Briscoe Deutscher, DO  nystatin-triamcinolone ointment [facility]) Apply 1 application topically 2 (two) times daily. Patient not taking: Reported on 01/21/2017 12/06/16   Briscoe Deutscher, DO    Family History Family History  Problem Relation Age of Onset  . Heart attack Father        several heart attacks  . Cancer Father        bone  . Arthritis Father   . Suicidality Mother   . Depression Mother   . Heart attack Brother   . Heart disease Brother   . Pneumonia Brother   . Suicidality Son   . Hypertension Daughter   . Depression Other        Most family members    Social History Social History  Substance Use Topics  . Smoking status: Never Smoker  . Smokeless tobacco: Never Used  . Alcohol use No     Allergies   Levaquin [levofloxacin] and Statins   Review of Systems Review of Systems  Cardiovascular: Positive for chest pain.  Genitourinary: Positive for flank pain.  All other systems reviewed and are negative.    Physical Exam Updated Vital Signs BP (!) 157/62   Pulse (!) 55   Temp 98.5 F (36.9 C) (Rectal)   Resp 20   Ht 5\' 6"  (1.676 m)   Wt 79.4 kg (175 lb)   SpO2 99%   BMI 28.25 kg/m   Physical Exam  Constitutional:  Uncomfortable, demented, chronically ill   HENT:  Head: Normocephalic and atraumatic.  Mouth/Throat: Oropharynx is clear and moist.  Eyes: Pupils are equal, round, and reactive to light. Conjunctivae and EOM are normal.  Neck: Normal range of motion. Neck supple.  Cardiovascular: Normal rate, regular  rhythm and normal heart sounds.   Pulmonary/Chest: Effort normal and breath sounds normal. No respiratory distress. She has no wheezes. She has no rales.  Abdominal: Soft. Bowel sounds are normal. She exhibits no distension. There is no tenderness. There is no guarding.  Musculoskeletal: Normal range of motion. She exhibits no edema.  Splint L foot. Splint removed, no obvious cellulitis. Had plantar fasciitis and had steroid injection and the injection site in the plantar area doesn't appear infected  Neurological: She is alert.  Demented   Skin: Skin is warm.  Psychiatric:  Unable   Nursing note and vitals reviewed.    ED Treatments / Results  Labs (all labs ordered are listed, but only abnormal results are displayed) Labs Reviewed  CBC WITH DIFFERENTIAL/PLATELET - Abnormal; Notable for the following:       Result Value   WBC 3.8 (*)    All other components within normal limits  URINALYSIS, ROUTINE W REFLEX MICROSCOPIC - Abnormal; Notable for the following:    Color, Urine STRAW (*)    Specific Gravity, Urine 1.003 (*)    All other components within normal limits  CULTURE, BLOOD (ROUTINE X 2)  CULTURE, BLOOD (ROUTINE X 2)  URINE CULTURE  COMPREHENSIVE METABOLIC PANEL  I-STAT TROPONIN, ED  I-STAT CG4 LACTIC ACID, ED  I-STAT TROPONIN, ED    EKG  EKG Interpretation  Date/Time:  Friday January 21 2017 09:39:47 EDT Ventricular Rate:  67 PR Interval:    QRS Duration: 101 QT Interval:  434 QTC Calculation: 459 R Axis:   88 Text Interpretation:  Sinus rhythm Borderline right axis deviation Low voltage, extremity and precordial leads Baseline wander in lead(s) V5 No significant change since last tracing Confirmed by Wandra Arthurs [phone]) on 01/21/2017 9:42:43 AM Also confirmed by Wandra Arthurs [phone]), editor Hattie Perch ([id number])  on 01/21/2017 12:16:20 PM       Radiology Ct Abdomen Pelvis W Contrast  Result Date: 01/21/2017 CLINICAL DATA:  Abdominal pain and flank pain,  urinary tract symptoms, remote hysterectomy EXAM: CT ABDOMEN AND PELVIS WITH CONTRAST TECHNIQUE: Multidetector CT imaging of the abdomen and pelvis was performed using the standard protocol following bolus administration of intravenous contrast. CONTRAST:  116mL ISOVUE-300 IOPAMIDOL (ISOVUE-300) INJECTION 61% COMPARISON:  07/31/2008 FINDINGS: Lower chest: Minor bibasilar atelectasis. Normal heart size. No pericardial or pleural effusion. Degenerative changes of the lower thoracic spine. Hepatobiliary: Small punctate calcified granuloma in the right hepatic dome, image 19. Probable small hypodense 4 mm hepatic cyst, image 18. No other significant hepatic abnormality or biliary dilatation. Hepatic and portal veins are patent. Gallbladder biliary system unremarkable. Pancreas: Unremarkable. No pancreatic ductal dilatation or surrounding inflammatory changes. Spleen: Normal in size without focal abnormality. Adrenals/Urinary Tract: Adrenal glands are unremarkable. Kidneys are normal, without renal calculi, focal lesion, or hydronephrosis. Bladder is unremarkable. Stomach/Bowel: Negative for bowel obstruction, significant dilatation, ileus, or free air. Appendix not visualized. No acute inflammatory process. No fluid collection or abscess. In the left abdominal mesentery, there is a heterogeneous solid enhancing mass. This measures 6 x 4 x 5.1 cm. The mass appears centered in the mesenteric fat but does contact adjacent normal appearing small bowel without associated bowel obstruction, bowel wall thickening, or perforation. Differential considerations include carcinoid tumor, lymphoma, desmoid tumor, and less likely a metastatic process, as there is no additional mesenteric adenopathy or peritoneal disease. No ascites. This is new compared 07/31/2008. Vascular/Lymphatic: Aortic atherosclerosis evident. No occlusive process or aneurysm. No dissection. Negative for retroperitoneal hemorrhage. Mesenteric and renal  vasculature appear patent. No other adenopathy. Reproductive: Remote hysterectomy. No adnexal abnormality. No pelvic free fluid or fluid collection. Other: No abdominal wall hernia or abnormality. No abdominopelvic ascites. Musculoskeletal: Bones are osteopenic. Degenerative changes noted spine. No acute osseous finding or compression fracture. IMPRESSION: 6 x 4 x 5.1 cm left mid abdominal mesenteric mass as detailed above. This does contact small bowel but no associated bowel wall thickening, perforation, or obstruction. Considerations again include carcinoid, lymphoma, desmoid,  and less likely metastatic process. No other acute intra-abdominal or pelvic process. Electronically Signed   By: Jerilynn Mages.  Shick M.D.   On: 01/21/2017 12:38   Ct L-spine No Charge  Result Date: 01/21/2017 CLINICAL DATA:  83 year old female with continued back and flank pain following antibiotic treatment for urinary tract infection. EXAM: CT LUMBAR SPINE WITHOUT CONTRAST TECHNIQUE: Multidetector CT imaging of the lumbar spine was performed without intravenous contrast administration. Multiplanar CT image reconstructions were also generated. COMPARISON:  CT Abdomen and Pelvis today reported separately. Lumbar MRI 01/18/2016. FINDINGS: Segmentation: Normal lumbar segmentation which is the same numbering system used on the 2017 lumbar MRI. Alignment: Stable with relatively preserved lumbar lordosis. Stable grade 1 anterolisthesis of L4 on L5 and subtle anterolisthesis of L3 on L4. Stable subtle retrolisthesis of L2 on L3. Vertebrae: Osteopenia. No lumbar compression fracture. No acute osseous abnormality identified. Visible sacrum and SI joints intact. Paraspinal and other soft tissues: Reported on the CT Abdomen and Pelvis today separately. Negative visualized posterior paraspinal soft tissues. Disc levels: T11-T12:  Negative. T12-L1:  Negative. L1-L2:  Negative. L2-L3: Mild disc space loss with circumferential disc bulge and endplate spurring.  Moderate facet hypertrophy. No stenosis. L3-L4: Subtle anterolisthesis. Circumferential disc bulge. Moderate to severe facet hypertrophy greater on the right. Vacuum facet on the right. Stable mild right L3 foraminal stenosis. L4-L5: Grade 1 anterolisthesis appears stable. Circumferential disc bulge with broad-based posterior component. Severe facet hypertrophy. Vacuum facet on the right. Borderline to mild spinal stenosis appears stable. No foraminal stenosis. L5-S1: Chronic disc space loss. Circumferential but mostly right far lateral disc osteophyte complex. Mild facet hypertrophy. No stenosis. IMPRESSION: 1. No acute osseous abnormality and stable lumbar spine degeneration since the MRI on 01/18/2016. 2.  CT Abdomen and Pelvis today reported separately. Electronically Signed   By: Genevie Ann M.D.   On: 01/21/2017 12:26   Dg Chest Port 1 View  Result Date: 01/21/2017 CLINICAL DATA:  Chest pain.  Altered mental status. EXAM: PORTABLE CHEST 1 VIEW COMPARISON:  09/16/2015 FINDINGS: The heart size and mediastinal contours are within normal limits. Both lungs are clear. The visualized skeletal structures are unremarkable. IMPRESSION: No acute abnormality.  Aortic atherosclerosis. Electronically Signed   By: Lorriane Shire M.D.   On: 01/21/2017 09:48    Procedures Procedures (including critical care time)  Medications Ordered in ED Medications  iopamidol (ISOVUE-300) 61 % injection (not administered)  iopamidol (ISOVUE-370) 76 % injection (not administered)  sodium chloride 0.9 % bolus 1,000 mL (0 mLs Intravenous Stopped 01/21/17 1338)  iopamidol (ISOVUE-300) 61 % injection 100 mL (100 mLs Intravenous Contrast Given 01/21/17 1151)  fentaNYL (SUBLIMAZE) injection 25 mcg (25 mcg Intravenous Given 01/21/17 1339)     Initial Impression / Assessment and Plan / ED Course  I have reviewed the triage vital signs and the nursing notes.  Pertinent labs & imaging results that were available during my care of the  patient were reviewed by me and considered in my medical decision making (see chart for details).     Valerie Chen is a 82 y.o. female here with chest pain, flank pain. Recent UTI and just finished abx. Consider pyelo vs renal colic. Also chest pain with hx of CAD so consider ACS as well. Will get labs, lactate, culture, UA, CXR, CT ab/pel.   1:43 PM WBC nl, UA nl. CT ab/pel showed L mid mesenteric mass. This is likely causing her pain. I talked to Dr. Alvy Bimler from oncology. She states that patient is  demented and has multiple co morbidities and the location of the mass is not easily biopsied. She may need surgical excision but will likely need extensive discussion with PCP about options. Daughter asked about pain control. Pain controlled with fentanyl. Will dc home with vicodin prn. Will likely need to discuss long term pain control options with PCP.    Final Clinical Impressions(s) / ED Diagnoses   Final diagnoses:  Back pain    New Prescriptions New Prescriptions   No medications on file     Drenda Freeze, MD 01/21/17 1345

## 2017-01-21 NOTE — ED Notes (Signed)
ED Provider at bedside. 

## 2017-01-21 NOTE — ED Notes (Signed)
Bed: WA08 Expected date:  Expected time:  Means of arrival:  Comments: 83 yo abd pain, was seen here this am

## 2017-01-21 NOTE — H&P (Signed)
History and Physical    Valerie Chen TLX:[id number] DOB: 17-Oct-1928 DOA: 01/21/2017  PCP: Briscoe Deutscher, DO  Patient coming from: Home.  Chief Complaint: Loss of consciousness.  HPI: Valerie Chen is a 83 y.o. female with history of dementia who had come to the ER earlier today for abdominal pain with lower extremity weakness and CAT scan done showed abdominal Valerie Chen patient's daughter decided to take patient home and plan was to keep patient on comfort measures. At home this evening patient while sitting on the bedside have any loss consciousness and had some jerking spells. EMS was called and patient was brought to the ER.   ED Course: In the ER patient was back to her baseline. But at this time. His daughter feels that patient is 10 difficult to handle at home but once no active measures but keep patient comfortable. Patient is being admitted for further observation.  Review of Systems: As per HPI, rest all negative.   Past Medical History:  Diagnosis Date  . Alzheimer disease   . Arthritis   . CAD (coronary artery disease)    Probable CAD, nuclear February, 2012, possible anteroseptal and inferoseptal ischemia but the study was technically difficult  . Chest pain    Hospital February, 2012, nuclear, possible anteroseptal and inferoseptal ischemia, technically difficult, medical therapy  . Dementia    Significant  . Diastolic dysfunction    Echo, February, 2012  . Diastolic dysfunction   . DNR (do not resuscitate)   . Dyslipidemia   . Edema    hospitalizations September, 2013, some fluid overload  . Ejection fraction    EF 55%, echo, February, 2012  //   EF 55-60%, echo, February 25, 2012 // Echo 12/17: EF 60-65, no RWMA, Gr 1 DD, normal RVSF  . Essential hypertension   . GI bleed    February, 2012 incomplete colonoscopy, hemorrhoids, needs complete colonoscopy  . Hx: UTI (urinary tract infection)   . Hyperlipidemia   . Leg pain, bilateral   . Major depression    2  overdoses in 1975 & 1977  . Migraines    25 year  . Orthostatic hypotension   . Suicide attempt ([place]) 1985   2 attempts with Valium  . TIA (transient ischemic attack)       ???question of a TIA in February, 2012, carotid Doppler, August 11, 2010 no significant abnormalities    Past Surgical History:  Procedure Laterality Date  . ABDOMINAL HYSTERECTOMY    . DIAGNOSTIC MAMMOGRAM  12/29/2000   Mammogram, suspicious lesions -BX pending  . TONSILLECTOMY       reports that she has never smoked. She has never used smokeless tobacco. She reports that she uses drugs. She reports that she does not drink alcohol.  Allergies  Allergen Reactions  . Levaquin [Levofloxacin] Other (See Comments)    Reaction:  Altered mental status   . Statins Other (See Comments)    Severe myalgias    Family History  Problem Relation Age of Onset  . Heart attack Father        several heart attacks  . Cancer Father        bone  . Arthritis Father   . Suicidality Mother   . Depression Mother   . Heart attack Brother   . Heart disease Brother   . Pneumonia Brother   . Suicidality Son   . Hypertension Daughter   . Depression Other        Most family members  Prior to Admission medications   Medication Sig Start Date End Date Taking? Authorizing Provider  acetaminophen (TYLENOL) 500 MG tablet Take 1,000 mg by mouth every 6 (six) hours as needed for moderate pain.   Yes [provider]  aspirin EC 81 MG tablet Take 81 mg by mouth daily.    Yes [provider]  Cholecalciferol (VITAMIN D-3) 1000 units CAPS Take 1,000 Units by mouth daily.    Yes [provider]  Cranberry (SM CRANBERRY) 300 MG tablet Take 300 mg by mouth 3 (three) times daily.    Yes [provider]  cyanocobalamin 500 MCG tablet Take 500 mcg by mouth daily.    Yes [provider]  docusate sodium (COLACE) 100 MG capsule Take 200 mg by mouth daily.   Yes [provider]    furosemide (LASIX) 40 MG tablet Take 20 mg by mouth daily as needed for edema.    Yes [provider]  potassium chloride SA (K-DUR,KLOR-CON) 20 MEQ tablet Take 20 mEq by mouth daily as needed. Taken with Furosemide   Yes [provider]  QUEtiapine (SEROQUEL) 25 MG tablet Take 1 tablet (25 mg total) by mouth every evening. 6pm 12/06/16  Yes Briscoe Deutscher, DO  amLODipine (NORVASC) 5 MG tablet Take 2.5 mg by mouth daily as needed.    [provider]  cephALEXin (KEFLEX) 500 MG capsule Take 1 capsule (500 mg total) by mouth 3 (three) times daily. Patient not taking: Reported on 01/21/2017 12/09/16   Briscoe Deutscher, DO  HYDROcodone-acetaminophen (NORCO/VICODIN) 5-325 MG tablet Take 1 tablet by mouth every 6 (six) hours as needed. 01/21/17   Drenda Freeze, MD  nystatin-triamcinolone ointment [facility]) Apply 1 application topically 2 (two) times daily. Patient not taking: Reported on 01/21/2017 12/06/16   Briscoe Deutscher, DO    Physical Exam: Vitals:   01/21/17 1916 01/21/17 2100 01/21/17 2250 01/21/17 2252  BP: (!) 143/61 (!) 164/80 136/65   Pulse: 64 (!) 36 67   Resp: (!) 25 15 16    Temp: 97.6 F (36.4 C)  97.9 F (36.6 C)   TempSrc: Oral  Oral   SpO2: 98% 95% 100%   Weight:    80.3 kg (177 lb)  Height:    5\' 4"  (1.626 m)      Constitutional: Moderately built and nourished. Vitals:   01/21/17 1916 01/21/17 2100 01/21/17 2250 01/21/17 2252  BP: (!) 143/61 (!) 164/80 136/65   Pulse: 64 (!) 36 67   Resp: (!) 25 15 16    Temp: 97.6 F (36.4 C)  97.9 F (36.6 C)   TempSrc: Oral  Oral   SpO2: 98% 95% 100%   Weight:    80.3 kg (177 lb)  Height:    5\' 4"  (1.626 m)   Eyes: Anicteric. No pallor. ENMT: No discharge from the ears eyes nose or mouth. Neck: No JVD appreciated. No mass felt. Respiratory: No rhonchi or crepitations. Cardiovascular: S1-S2 regular no murmurs appreciated. Abdomen: Soft nontender bowel sounds present. Musculoskeletal: No edema. No  joint effusion. Skin: No rashes skin appears warm. Neurologic: Alert awake patient is profoundly demented. Moves all extremities. Psychiatric: Patient is demented.   Labs on Admission: I have personally reviewed following labs and imaging studies  CBC:  Recent Labs Lab 01/21/17 0924  WBC 3.8*  NEUTROABS 1.8  HGB 15.0  HCT 42.5  MCV 90.0  PLT 867   Basic Metabolic Panel:  Recent Labs Lab 01/21/17 0924  NA 141  K 3.8  CL 106  CO2 25  GLUCOSE 85  BUN 10  CREATININE 0.73  CALCIUM 9.7   GFR: Estimated Creatinine Clearance: 51.7 mL/min (by C-G formula based on SCr of 0.73 mg/dL). Liver Function Tests:  Recent Labs Lab 01/21/17 0924  AST 23  ALT 19  ALKPHOS 64  BILITOT 0.6  PROT 6.8  ALBUMIN 3.9   No results for input(s): LIPASE, AMYLASE in the last 168 hours. No results for input(s): AMMONIA in the last 168 hours. Coagulation Profile: No results for input(s): INR, PROTIME in the last 168 hours. Cardiac Enzymes: No results for input(s): CKTOTAL, CKMB, CKMBINDEX, TROPONINI in the last 168 hours. BNP (last 3 results) No results for input(s): PROBNP in the last 8760 hours. HbA1C: No results for input(s): HGBA1C in the last 72 hours. CBG: No results for input(s): GLUCAP in the last 168 hours. Lipid Profile: No results for input(s): CHOL, HDL, LDLCALC, TRIG, CHOLHDL, LDLDIRECT in the last 72 hours. Thyroid Function Tests: No results for input(s): TSH, T4TOTAL, FREET4, T3FREE, THYROIDAB in the last 72 hours. Anemia Panel: No results for input(s): VITAMINB12, FOLATE, FERRITIN, TIBC, IRON, RETICCTPCT in the last 72 hours. Urine analysis:    Component Value Date/Time   COLORURINE STRAW (A) 01/21/2017 0940   APPEARANCEUR CLEAR 01/21/2017 0940   LABSPEC 1.003 (L) 01/21/2017 0940   PHURINE 8.0 01/21/2017 0940   GLUCOSEU NEGATIVE 01/21/2017 0940   GLUCOSEU NEGATIVE 12/09/2016 1104   HGBUR NEGATIVE 01/21/2017 0940   BILIRUBINUR NEGATIVE 01/21/2017 0940    BILIRUBINUR Negative 12/09/2016 1048   KETONESUR NEGATIVE 01/21/2017 0940   PROTEINUR NEGATIVE 01/21/2017 0940   UROBILINOGEN 0.2 12/09/2016 1104   NITRITE NEGATIVE 01/21/2017 0940   LEUKOCYTESUR NEGATIVE 01/21/2017 0940   Sepsis Labs: @LABRCNTIP (procalcitonin:4,lacticidven:4) )No results found for this or any previous visit (from the past 240 hour(s)).   Radiological Exams on Admission: Ct Abdomen Pelvis W Contrast  Result Date: 01/21/2017 CLINICAL DATA:  Abdominal pain and flank pain, urinary tract symptoms, remote hysterectomy EXAM: CT ABDOMEN AND PELVIS WITH CONTRAST TECHNIQUE: Multidetector CT imaging of the abdomen and pelvis was performed using the standard protocol following bolus administration of intravenous contrast. CONTRAST:  186mL ISOVUE-300 IOPAMIDOL (ISOVUE-300) INJECTION 61% COMPARISON:  07/31/2008 FINDINGS: Lower chest: Minor bibasilar atelectasis. Normal heart size. No pericardial or pleural effusion. Degenerative changes of the lower thoracic spine. Hepatobiliary: Small punctate calcified granuloma in the right hepatic dome, image 19. Probable small hypodense 4 mm hepatic cyst, image 18. No other significant hepatic abnormality or biliary dilatation. Hepatic and portal veins are patent. Gallbladder biliary system unremarkable. Pancreas: Unremarkable. No pancreatic ductal dilatation or surrounding inflammatory changes. Spleen: Normal in size without focal abnormality. Adrenals/Urinary Tract: Adrenal glands are unremarkable. Kidneys are normal, without renal calculi, focal lesion, or hydronephrosis. Bladder is unremarkable. Stomach/Bowel: Negative for bowel obstruction, significant dilatation, ileus, or free air. Appendix not visualized. No acute inflammatory process. No fluid collection or abscess. In the left abdominal mesentery, there is a heterogeneous solid enhancing mass. This measures 6 x 4 x 5.1 cm. The mass appears centered in the mesenteric fat but does contact adjacent  normal appearing small bowel without associated bowel obstruction, bowel wall thickening, or perforation. Differential considerations include carcinoid tumor, lymphoma, desmoid tumor, and less likely a metastatic process, as there is no additional mesenteric adenopathy or peritoneal disease. No ascites. This is new compared 07/31/2008. Vascular/Lymphatic: Aortic atherosclerosis evident. No occlusive process or aneurysm. No dissection. Negative for retroperitoneal hemorrhage. Mesenteric and renal vasculature appear patent. No other adenopathy.  Reproductive: Remote hysterectomy. No adnexal abnormality. No pelvic free fluid or fluid collection. Other: No abdominal wall hernia or abnormality. No abdominopelvic ascites. Musculoskeletal: Bones are osteopenic. Degenerative changes noted spine. No acute osseous finding or compression fracture. IMPRESSION: 6 x 4 x 5.1 cm left mid abdominal mesenteric mass as detailed above. This does contact small bowel but no associated bowel wall thickening, perforation, or obstruction. Considerations again include carcinoid, lymphoma, desmoid, and less likely metastatic process. No other acute intra-abdominal or pelvic process. Electronically Signed   By: Jerilynn Mages.  Shick M.D.   On: 01/21/2017 12:38   Ct L-spine No Charge  Result Date: 01/21/2017 CLINICAL DATA:  83 year old female with continued back and flank pain following antibiotic treatment for urinary tract infection. EXAM: CT LUMBAR SPINE WITHOUT CONTRAST TECHNIQUE: Multidetector CT imaging of the lumbar spine was performed without intravenous contrast administration. Multiplanar CT image reconstructions were also generated. COMPARISON:  CT Abdomen and Pelvis today reported separately. Lumbar MRI 01/18/2016. FINDINGS: Segmentation: Normal lumbar segmentation which is the same numbering system used on the 2017 lumbar MRI. Alignment: Stable with relatively preserved lumbar lordosis. Stable grade 1 anterolisthesis of L4 on L5 and subtle  anterolisthesis of L3 on L4. Stable subtle retrolisthesis of L2 on L3. Vertebrae: Osteopenia. No lumbar compression fracture. No acute osseous abnormality identified. Visible sacrum and SI joints intact. Paraspinal and other soft tissues: Reported on the CT Abdomen and Pelvis today separately. Negative visualized posterior paraspinal soft tissues. Disc levels: T11-T12:  Negative. T12-L1:  Negative. L1-L2:  Negative. L2-L3: Mild disc space loss with circumferential disc bulge and endplate spurring. Moderate facet hypertrophy. No stenosis. L3-L4: Subtle anterolisthesis. Circumferential disc bulge. Moderate to severe facet hypertrophy greater on the right. Vacuum facet on the right. Stable mild right L3 foraminal stenosis. L4-L5: Grade 1 anterolisthesis appears stable. Circumferential disc bulge with broad-based posterior component. Severe facet hypertrophy. Vacuum facet on the right. Borderline to mild spinal stenosis appears stable. No foraminal stenosis. L5-S1: Chronic disc space loss. Circumferential but mostly right far lateral disc osteophyte complex. Mild facet hypertrophy. No stenosis. IMPRESSION: 1. No acute osseous abnormality and stable lumbar spine degeneration since the MRI on 01/18/2016. 2.  CT Abdomen and Pelvis today reported separately. Electronically Signed   By: Genevie Ann M.D.   On: 01/21/2017 12:26   Dg Chest Port 1 View  Result Date: 01/21/2017 CLINICAL DATA:  Chest pain.  Altered mental status. EXAM: PORTABLE CHEST 1 VIEW COMPARISON:  09/16/2015 FINDINGS: The heart size and mediastinal contours are within normal limits. Both lungs are clear. The visualized skeletal structures are unremarkable. IMPRESSION: No acute abnormality.  Aortic atherosclerosis. Electronically Signed   By: Lorriane Shire M.D.   On: 01/21/2017 09:48    EKG: Independently reviewed was done early in the morning shows normal sinus rhythm.  Assessment/Plan Principal Problem:   Syncope Active Problems:   Dementia    Abdominal mass    1. Syncope - possibly not clear. 2. Abdominal mass likely could be malignancy - daughter not planning further workup. 3. Dementia presently on Seroquel for behavioral disturbances.  Plan - patient's daughter wants to take patient complete comfort measures only. Patient is being admitted to [place] floor. Patient's daughter does not want any further workup including labs. Patient is a DO NOT RESUSCITATE.   DVT prophylaxis: Lovenox. Code Status: DO NOT RESUSCITATE.  Family Communication: Patient's daughter.  Disposition Plan: Home.  Consults called: Palliative care.  Admission status: Observation.    [name] MD [facility] Pager [phone]-  [phone].  If 7PM-7AM, please contact night-coverage [email or web address] Password [email or web address]  01/21/2017, 11:05 PM

## 2017-01-21 NOTE — Telephone Encounter (Signed)
Called [facility] [place] spoke to [place] and told her we have a patient to admit to Hospice for pain management and care.  Verbal orders given, Dr. Juleen China to be attending physician and okay to use standing orders for Hospice. Dewaine Oats verbalized understanding and said someone will see pt on Sunday, sooner if available. Told her okay.

## 2017-01-21 NOTE — Telephone Encounter (Signed)
Preferred hospice is [facility]

## 2017-01-21 NOTE — ED Provider Notes (Signed)
[facility] DEPT Provider Note   CSN: [id number] Arrival date & time: 01/21/17  1902     History   Chief Complaint Chief Complaint  Patient presents with  . Loss of Consciousness    HPI Faven Watterson is a 83 y.o. female.  HPI Level V caveat due to dementia Seen in the ER earlier today. Abdominal pain. Diagnosed with new abdominal mass. Patient is getting arrange for outpatient hospice. However pain has been an issue and patient went home and had a single episode. May have had some shaking. A little confused after. At this point family thinks it is unsafe at home. Patient would not one anything invasive done. Hospice is supposed to come to the house in 2 days. Past Medical History:  Diagnosis Date  . Alzheimer disease   . Arthritis   . CAD (coronary artery disease)    Probable CAD, nuclear February, 2012, possible anteroseptal and inferoseptal ischemia but the study was technically difficult  . Chest pain    Hospital February, 2012, nuclear, possible anteroseptal and inferoseptal ischemia, technically difficult, medical therapy  . Dementia    Significant  . Diastolic dysfunction    Echo, February, 2012  . Diastolic dysfunction   . DNR (do not resuscitate)   . Dyslipidemia   . Edema    hospitalizations September, 2013, some fluid overload  . Ejection fraction    EF 55%, echo, February, 2012  //   EF 55-60%, echo, February 25, 2012 // Echo 12/17: EF 60-65, no RWMA, Gr 1 DD, normal RVSF  . Essential hypertension   . GI bleed    February, 2012 incomplete colonoscopy, hemorrhoids, needs complete colonoscopy  . Hx: UTI (urinary tract infection)   . Hyperlipidemia   . Leg pain, bilateral   . Major depression    2 overdoses in 1975 & 1977  . Migraines    25 year  . Orthostatic hypotension   . Suicide attempt ([place]) 1985   2 attempts with Valium  . TIA (transient ischemic attack)       ???question of a TIA in February, 2012, carotid Doppler, August 11, 2010 no  significant abnormalities    Patient Active Problem List   Diagnosis Date Noted  . NSVT (nonsustained ventricular tachycardia) ([place]) 05/11/2016  . Coarse tremors 05/06/2016  . Syncope 12/29/2015  . Recurrent UTI 02/01/2015  . Essential hypertension 05/30/2013  . Edema   . Psychosis in elderly 03/12/2011  . Coronary artery disease   . Migraine headache   . Diastolic dysfunction   . TIA (transient ischemic attack)   . GI bleed   . Dyslipidemia   . Pain in joints 05/22/2009  . Vitamin D deficiency 01/27/2009  . Anemia 11/21/2008    Past Surgical History:  Procedure Laterality Date  . ABDOMINAL HYSTERECTOMY    . DIAGNOSTIC MAMMOGRAM  12/29/2000   Mammogram, suspicious lesions -BX pending  . TONSILLECTOMY      OB History    No data available       Home Medications    Prior to Admission medications   Medication Sig Start Date End Date Taking? Authorizing Provider  acetaminophen (TYLENOL) 500 MG tablet Take 1,000 mg by mouth every 6 (six) hours as needed for moderate pain.    [provider]  amLODipine (NORVASC) 5 MG tablet Take 2.5 mg by mouth daily as needed.    [provider]  aspirin EC 81 MG tablet Take 81 mg by mouth daily.     [provider]  cephALEXin (KEFLEX) 500 MG capsule Take 1 capsule (500 mg total) by mouth 3 (three) times daily. Patient not taking: Reported on 01/21/2017 12/09/16   Briscoe Deutscher, DO  Cholecalciferol (VITAMIN D-3) 1000 units CAPS Take 1,000 Units by mouth daily.     [provider]  Cranberry (SM CRANBERRY) 300 MG tablet Take 300 mg by mouth 3 (three) times daily.     [provider]  cyanocobalamin 500 MCG tablet Take 500 mcg by mouth daily.     [provider]  docusate sodium (COLACE) 100 MG capsule Take 200 mg by mouth daily.    [provider]  furosemide (LASIX) 40 MG tablet Take 20 mg by mouth daily as needed for edema.     [provider]    HYDROcodone-acetaminophen (NORCO/VICODIN) 5-325 MG tablet Take 1 tablet by mouth every 6 (six) hours as needed. 01/21/17   Drenda Freeze, MD  nystatin-triamcinolone ointment [facility]) Apply 1 application topically 2 (two) times daily. Patient not taking: Reported on 01/21/2017 12/06/16   Briscoe Deutscher, DO  potassium chloride SA (K-DUR,KLOR-CON) 20 MEQ tablet Take 20 mEq by mouth daily as needed. Taken with Furosemide    [provider]  QUEtiapine (SEROQUEL) 25 MG tablet Take 1 tablet (25 mg total) by mouth every evening. 6pm 12/06/16   Briscoe Deutscher, DO    Family History Family History  Problem Relation Age of Onset  . Heart attack Father        several heart attacks  . Cancer Father        bone  . Arthritis Father   . Suicidality Mother   . Depression Mother   . Heart attack Brother   . Heart disease Brother   . Pneumonia Brother   . Suicidality Son   . Hypertension Daughter   . Depression Other        Most family members    Social History Social History  Substance Use Topics  . Smoking status: Never Smoker  . Smokeless tobacco: Never Used  . Alcohol use No     Allergies   Levaquin [levofloxacin] and Statins   Review of Systems Review of Systems  Unable to perform ROS: Dementia     Physical Exam Updated Vital Signs BP (!) 143/61 (BP Location: Left Arm)   Pulse 64   Temp 97.6 F (36.4 C) (Oral)   Resp (!) 25   SpO2 98%   Physical Exam  Constitutional: She appears well-developed.  HENT:  Head: Atraumatic.  Eyes: Pupils are equal, round, and reactive to light.  Cardiovascular: Normal rate.   Pulmonary/Chest: Effort normal.  Abdominal: There is tenderness.  Neurological: She is alert.  Patient is awake but demented. At baseline per family member.  Skin: Skin is warm. Capillary refill takes less than 2 seconds.     ED Treatments / Results  Labs (all labs ordered are listed, but only abnormal results are displayed) Labs Reviewed - No data  to display  EKG  EKG Interpretation None       Radiology Ct Abdomen Pelvis W Contrast  Result Date: 01/21/2017 CLINICAL DATA:  Abdominal pain and flank pain, urinary tract symptoms, remote hysterectomy EXAM: CT ABDOMEN AND PELVIS WITH CONTRAST TECHNIQUE: Multidetector CT imaging of the abdomen and pelvis was performed using the standard protocol following bolus administration of intravenous contrast. CONTRAST:  128mL ISOVUE-300 IOPAMIDOL (ISOVUE-300) INJECTION 61% COMPARISON:  07/31/2008 FINDINGS: Lower chest: Minor bibasilar atelectasis. Normal heart size. No pericardial or  pleural effusion. Degenerative changes of the lower thoracic spine. Hepatobiliary: Small punctate calcified granuloma in the right hepatic dome, image 19. Probable small hypodense 4 mm hepatic cyst, image 18. No other significant hepatic abnormality or biliary dilatation. Hepatic and portal veins are patent. Gallbladder biliary system unremarkable. Pancreas: Unremarkable. No pancreatic ductal dilatation or surrounding inflammatory changes. Spleen: Normal in size without focal abnormality. Adrenals/Urinary Tract: Adrenal glands are unremarkable. Kidneys are normal, without renal calculi, focal lesion, or hydronephrosis. Bladder is unremarkable. Stomach/Bowel: Negative for bowel obstruction, significant dilatation, ileus, or free air. Appendix not visualized. No acute inflammatory process. No fluid collection or abscess. In the left abdominal mesentery, there is a heterogeneous solid enhancing mass. This measures 6 x 4 x 5.1 cm. The mass appears centered in the mesenteric fat but does contact adjacent normal appearing small bowel without associated bowel obstruction, bowel wall thickening, or perforation. Differential considerations include carcinoid tumor, lymphoma, desmoid tumor, and less likely a metastatic process, as there is no additional mesenteric adenopathy or peritoneal disease. No ascites. This is new compared 07/31/2008.  Vascular/Lymphatic: Aortic atherosclerosis evident. No occlusive process or aneurysm. No dissection. Negative for retroperitoneal hemorrhage. Mesenteric and renal vasculature appear patent. No other adenopathy. Reproductive: Remote hysterectomy. No adnexal abnormality. No pelvic free fluid or fluid collection. Other: No abdominal wall hernia or abnormality. No abdominopelvic ascites. Musculoskeletal: Bones are osteopenic. Degenerative changes noted spine. No acute osseous finding or compression fracture. IMPRESSION: 6 x 4 x 5.1 cm left mid abdominal mesenteric mass as detailed above. This does contact small bowel but no associated bowel wall thickening, perforation, or obstruction. Considerations again include carcinoid, lymphoma, desmoid, and less likely metastatic process. No other acute intra-abdominal or pelvic process. Electronically Signed   By: Jerilynn Mages.  Shick M.D.   On: 01/21/2017 12:38   Ct L-spine No Charge  Result Date: 01/21/2017 CLINICAL DATA:  83 year old female with continued back and flank pain following antibiotic treatment for urinary tract infection. EXAM: CT LUMBAR SPINE WITHOUT CONTRAST TECHNIQUE: Multidetector CT imaging of the lumbar spine was performed without intravenous contrast administration. Multiplanar CT image reconstructions were also generated. COMPARISON:  CT Abdomen and Pelvis today reported separately. Lumbar MRI 01/18/2016. FINDINGS: Segmentation: Normal lumbar segmentation which is the same numbering system used on the 2017 lumbar MRI. Alignment: Stable with relatively preserved lumbar lordosis. Stable grade 1 anterolisthesis of L4 on L5 and subtle anterolisthesis of L3 on L4. Stable subtle retrolisthesis of L2 on L3. Vertebrae: Osteopenia. No lumbar compression fracture. No acute osseous abnormality identified. Visible sacrum and SI joints intact. Paraspinal and other soft tissues: Reported on the CT Abdomen and Pelvis today separately. Negative visualized posterior paraspinal  soft tissues. Disc levels: T11-T12:  Negative. T12-L1:  Negative. L1-L2:  Negative. L2-L3: Mild disc space loss with circumferential disc bulge and endplate spurring. Moderate facet hypertrophy. No stenosis. L3-L4: Subtle anterolisthesis. Circumferential disc bulge. Moderate to severe facet hypertrophy greater on the right. Vacuum facet on the right. Stable mild right L3 foraminal stenosis. L4-L5: Grade 1 anterolisthesis appears stable. Circumferential disc bulge with broad-based posterior component. Severe facet hypertrophy. Vacuum facet on the right. Borderline to mild spinal stenosis appears stable. No foraminal stenosis. L5-S1: Chronic disc space loss. Circumferential but mostly right far lateral disc osteophyte complex. Mild facet hypertrophy. No stenosis. IMPRESSION: 1. No acute osseous abnormality and stable lumbar spine degeneration since the MRI on 01/18/2016. 2.  CT Abdomen and Pelvis today reported separately. Electronically Signed   By: Genevie Ann M.D.   On: 01/21/2017  12:26   Dg Chest Port 1 View  Result Date: 01/21/2017 CLINICAL DATA:  Chest pain.  Altered mental status. EXAM: PORTABLE CHEST 1 VIEW COMPARISON:  09/16/2015 FINDINGS: The heart size and mediastinal contours are within normal limits. Both lungs are clear. The visualized skeletal structures are unremarkable. IMPRESSION: No acute abnormality.  Aortic atherosclerosis. Electronically Signed   By: Lorriane Shire M.D.   On: 01/21/2017 09:48    Procedures Procedures (including critical care time)  Medications Ordered in ED Medications - No data to display   Initial Impression / Assessment and Plan / ED Course  I have reviewed the triage vital signs and the nursing notes.  Pertinent labs & imaging results that were available during my care of the patient were reviewed by me and considered in my medical decision making (see chart for details).     Patient with syncopal episode. Being arranged for home hospice but unsafe at home  until then. Has had falls. I think it is reasonable to bring in the hospital for more palliative care. Did have syncope and questionable seizure activity with some shaking. However with the palliative care and goals of care need to be discussed before more extensive workup was done. Lab work from earlier today were reviewed.  Final Clinical Impressions(s) / ED Diagnoses   Final diagnoses:  Admission for palliative care  Syncope, unspecified syncope type    New Prescriptions New Prescriptions   No medications on file     Davonna Belling, MD 01/21/17 2048

## 2017-01-21 NOTE — ED Notes (Signed)
Pt c/o chest pain - EKG performed. EDP YAO present to re evaluate

## 2017-01-21 NOTE — Telephone Encounter (Signed)
Called Valerie Chen to advise referral send to [facility] hospice.   Dr Juleen China, would you like to work this patient in on Monday or no?   Valerie Chen, please call Valerie Chen to advise either way

## 2017-01-21 NOTE — ED Notes (Signed)
BLOOD CULTURE X 2 3ML OBTAINED RT WRIST

## 2017-01-21 NOTE — Telephone Encounter (Signed)
Noted. Valerie Chen is currently working on concern with [name]

## 2017-01-21 NOTE — Progress Notes (Signed)
CM consulted due for medicare pt with 2 visits in the ED in last 6 months.  Pt noted to meet [place] [facility] network criteria.  Spoke with pt's daughter and POA, Mrs Benjamine Mola, who was interested in the referral.  Consult order placed for [facility].  Mrs Benjamine Mola states they found a large mass in pt's abdomen today and the plan is D/C home/self care with pain management until follow up with PCP on Tues to initiate hospice care.  CM advised pt to call PCP, Dr Juleen China, today to request a home hospice order so they could start the process today or over the weekend and then still see the pt on Tuesday. Pt's daughter was calling as CM left room.  Pt currently has a private duty aide that comes in 6 days a week, 12 hours a day.  Advised daughter hospice could assist with any HHS needs at their assessment. No further CM needs noted at this time.

## 2017-01-21 NOTE — Telephone Encounter (Signed)
Patient has been hospitalized and needs a hospital f/u. Patient was advised by doctors that she has a mass in her stomach that is pressing on her kidney, bladder and bowel. I scheduled the patient for Tuesday at 9:45 due to Dr. Juleen China schedule. Please notify patient's daughter if there is availability on Monday.

## 2017-01-21 NOTE — ED Triage Notes (Signed)
Pt was seen here earlier today and dx with abdominal mass. Pt was discharged around 1600 per family and while putting pt in bed patient passed out for about 3 seconds per family and caregiver. Pt arrived alert and confused (baseline for pt per EMS). Skin pink warm and dry.

## 2017-01-21 NOTE — ED Triage Notes (Signed)
Per EMS- Pt with HX of UTI Antibiotics completed. Here for back, flank pain and ? UTI symptoms. Saw PCP recently. Denies N/V/D and fever. EDP  Darl Householder present evaluating pt upon arrival to ED.

## 2017-01-21 NOTE — Telephone Encounter (Signed)
Elvina Sidle called back in. She spoke to the case worker. The case worker is asking that we place an order for hospice services today so that they can come out over the weekend for the patient.  Additionally they ask that we have Dr Juleen China look at the ED notes from today. I am including Lucina Mellow, RN to advise. Will call Mardene Celeste back @ [phone] to advise.

## 2017-01-21 NOTE — Discharge Instructions (Signed)
You have abdominal mass that is likely causing your pain.   Take tylenol as needed for pain.   Take vicodin for severe pain.   Please call your doctor and get follow up next week to discuss options for workup and pain control   Return to ER if she has uncontrolled abdominal pain, vomiting, fevers, dehydration

## 2017-01-22 DIAGNOSIS — M549 Dorsalgia, unspecified: Secondary | ICD-10-CM | POA: Diagnosis not present

## 2017-01-22 DIAGNOSIS — R55 Syncope and collapse: Secondary | ICD-10-CM

## 2017-01-22 MED ORDER — ACETAMINOPHEN 325 MG PO TABS
650.0000 mg | ORAL_TABLET | Freq: Four times a day (QID) | ORAL | Status: DC | PRN
  Administered 2017-01-22: 650 mg via ORAL
  Filled 2017-01-22: qty 2

## 2017-01-22 MED ORDER — POLYETHYLENE GLYCOL 3350 17 G PO PACK
17.0000 g | PACK | Freq: Every day | ORAL | Status: DC
  Administered 2017-01-22: 17 g via ORAL
  Filled 2017-01-22: qty 1

## 2017-01-22 MED ORDER — POLYETHYLENE GLYCOL 3350 17 G PO PACK: 17.0000 g | PACK | Freq: Every day | ORAL | 0 refills | Status: DC

## 2017-01-22 NOTE — Discharge Summary (Signed)
Physician Discharge Summary  Valerie Chen BOF:[id number] DOB: 1928-12-27 DOA: 01/21/2017  PCP: Briscoe Deutscher, DO  Admit date: 01/21/2017 Discharge date: 01/22/2017  Admitted From: Home Disposition:  Home with home hospice  Discharge Condition: Stable, guarded prognosis CODE STATUS: DNR  Diet recommendation: General   Brief/Interim Summary: Valerie Chen is a 83 y.o. female with history of dementia who had come to the ER earlier today for abdominal pain with lower extremity weakness and CAT scan done showed abdominal mesenteric mass. Patient's daughter decided to take patient home and plan was to keep patient on comfort measures with home hospice. Then, at home this evening, patient had syncopal episode and was again brought in to the hospital.   She was monitored overnight. Discussed with daughter at bedside who is declining all further work up, lab work, Social research officer, government and would like mother at home with hospice with needed equipment. CM worked to arrange for hospice evaluation as well as equipment to arrive at home this evening.   Discharge Diagnoses:  Principal Problem:   Syncope Active Problems:   Dementia   Abdominal mass  Syncope -Unclear etiology, but daughter declines further work up. Would stop norvasc.   Abdominal mesenteric mass -Differential includes carcinoid, lymphoma, desmoid, and less likely metastatic process. -Daughter does not want further work up or treatment -Enroll in home hospice   Alzheimer's dementia -Seroquel qhs    Discharge Instructions  Discharge Instructions    Diet general    Complete by:  As directed    Discharge instructions    Complete by:  As directed    You were cared for by a hospitalist during your hospital stay. If you have any questions about your discharge medications or the care you received while you were in the hospital after you are discharged, you can call the unit and asked to speak with the hospitalist on call if the hospitalist that took  care of you is not available. Once you are discharged, your primary care physician will handle any further medical issues. Please note that NO REFILLS for any discharge medications will be authorized once you are discharged, as it is imperative that you return to your primary care physician (or establish a relationship with a primary care physician if you do not have one) for your aftercare needs so that they can reassess your need for medications and monitor your lab values.   Increase activity slowly    Complete by:  As directed      Allergies as of 01/22/2017      Reactions   Levaquin [levofloxacin] Other (See Comments)   Reaction:  Altered mental status    Statins Other (See Comments)   Severe myalgias      Medication List    STOP taking these medications   amLODipine 5 MG tablet Commonly known as:  NORVASC   aspirin EC 81 MG tablet   cephALEXin 500 MG capsule Commonly known as:  KEFLEX   furosemide 40 MG tablet Commonly known as:  LASIX   potassium chloride SA 20 MEQ tablet Commonly known as:  K-DUR,KLOR-CON     TAKE these medications   acetaminophen 500 MG tablet Commonly known as:  TYLENOL Take 1,000 mg by mouth every 6 (six) hours as needed for moderate pain.   cyanocobalamin 500 MCG tablet Take 500 mcg by mouth daily.   docusate sodium 100 MG capsule Commonly known as:  COLACE Take 200 mg by mouth daily.   HYDROcodone-acetaminophen 5-325 MG tablet Commonly known as:  NORCO/VICODIN Take 1 tablet by mouth every 6 (six) hours as needed.   nystatin-triamcinolone ointment Commonly known as:  MYCOLOG Apply 1 application topically 2 (two) times daily.   polyethylene glycol packet Commonly known as:  MIRALAX / GLYCOLAX Take 17 g by mouth daily.   QUEtiapine 25 MG tablet Commonly known as:  SEROQUEL Take 1 tablet (25 mg total) by mouth every evening. 6pm   SM CRANBERRY 300 MG tablet Generic drug:  Cranberry Take 300 mg by mouth 3 (three) times daily.    Vitamin D-3 1000 units Caps Take 1,000 Units by mouth daily.      Follow-up Information    [place] Follow up.   Specialty:  Hospice and Palliative Medicine Why:  Hospice RN will contact you for initial visit Contact information: [place] [phone]          Allergies  Allergen Reactions  . Levaquin [Levofloxacin] Other (See Comments)    Reaction:  Altered mental status   . Statins Other (See Comments)    Severe myalgias    Consultations:  None   Procedures/Studies: Ct Abdomen Pelvis W Contrast  Result Date: 01/21/2017 CLINICAL DATA:  Abdominal pain and flank pain, urinary tract symptoms, remote hysterectomy EXAM: CT ABDOMEN AND PELVIS WITH CONTRAST TECHNIQUE: Multidetector CT imaging of the abdomen and pelvis was performed using the standard protocol following bolus administration of intravenous contrast. CONTRAST:  17mL ISOVUE-300 IOPAMIDOL (ISOVUE-300) INJECTION 61% COMPARISON:  07/31/2008 FINDINGS: Lower chest: Minor bibasilar atelectasis. Normal heart size. No pericardial or pleural effusion. Degenerative changes of the lower thoracic spine. Hepatobiliary: Small punctate calcified granuloma in the right hepatic dome, image 19. Probable small hypodense 4 mm hepatic cyst, image 18. No other significant hepatic abnormality or biliary dilatation. Hepatic and portal veins are patent. Gallbladder biliary system unremarkable. Pancreas: Unremarkable. No pancreatic ductal dilatation or surrounding inflammatory changes. Spleen: Normal in size without focal abnormality. Adrenals/Urinary Tract: Adrenal glands are unremarkable. Kidneys are normal, without renal calculi, focal lesion, or hydronephrosis. Bladder is unremarkable. Stomach/Bowel: Negative for bowel obstruction, significant dilatation, ileus, or free air. Appendix not visualized. No acute inflammatory process. No fluid collection or abscess. In the left abdominal mesentery, there  is a heterogeneous solid enhancing mass. This measures 6 x 4 x 5.1 cm. The mass appears centered in the mesenteric fat but does contact adjacent normal appearing small bowel without associated bowel obstruction, bowel wall thickening, or perforation. Differential considerations include carcinoid tumor, lymphoma, desmoid tumor, and less likely a metastatic process, as there is no additional mesenteric adenopathy or peritoneal disease. No ascites. This is new compared 07/31/2008. Vascular/Lymphatic: Aortic atherosclerosis evident. No occlusive process or aneurysm. No dissection. Negative for retroperitoneal hemorrhage. Mesenteric and renal vasculature appear patent. No other adenopathy. Reproductive: Remote hysterectomy. No adnexal abnormality. No pelvic free fluid or fluid collection. Other: No abdominal wall hernia or abnormality. No abdominopelvic ascites. Musculoskeletal: Bones are osteopenic. Degenerative changes noted spine. No acute osseous finding or compression fracture. IMPRESSION: 6 x 4 x 5.1 cm left mid abdominal mesenteric mass as detailed above. This does contact small bowel but no associated bowel wall thickening, perforation, or obstruction. Considerations again include carcinoid, lymphoma, desmoid, and less likely metastatic process. No other acute intra-abdominal or pelvic process. Electronically Signed   By: Jerilynn Mages.  Shick M.D.   On: 01/21/2017 12:38   Ct L-spine No Charge  Result Date: 01/21/2017 CLINICAL DATA:  83 year old female with continued back and flank pain following antibiotic treatment for urinary  tract infection. EXAM: CT LUMBAR SPINE WITHOUT CONTRAST TECHNIQUE: Multidetector CT imaging of the lumbar spine was performed without intravenous contrast administration. Multiplanar CT image reconstructions were also generated. COMPARISON:  CT Abdomen and Pelvis today reported separately. Lumbar MRI 01/18/2016. FINDINGS: Segmentation: Normal lumbar segmentation which is the same numbering system  used on the 2017 lumbar MRI. Alignment: Stable with relatively preserved lumbar lordosis. Stable grade 1 anterolisthesis of L4 on L5 and subtle anterolisthesis of L3 on L4. Stable subtle retrolisthesis of L2 on L3. Vertebrae: Osteopenia. No lumbar compression fracture. No acute osseous abnormality identified. Visible sacrum and SI joints intact. Paraspinal and other soft tissues: Reported on the CT Abdomen and Pelvis today separately. Negative visualized posterior paraspinal soft tissues. Disc levels: T11-T12:  Negative. T12-L1:  Negative. L1-L2:  Negative. L2-L3: Mild disc space loss with circumferential disc bulge and endplate spurring. Moderate facet hypertrophy. No stenosis. L3-L4: Subtle anterolisthesis. Circumferential disc bulge. Moderate to severe facet hypertrophy greater on the right. Vacuum facet on the right. Stable mild right L3 foraminal stenosis. L4-L5: Grade 1 anterolisthesis appears stable. Circumferential disc bulge with broad-based posterior component. Severe facet hypertrophy. Vacuum facet on the right. Borderline to mild spinal stenosis appears stable. No foraminal stenosis. L5-S1: Chronic disc space loss. Circumferential but mostly right far lateral disc osteophyte complex. Mild facet hypertrophy. No stenosis. IMPRESSION: 1. No acute osseous abnormality and stable lumbar spine degeneration since the MRI on 01/18/2016. 2.  CT Abdomen and Pelvis today reported separately. Electronically Signed   By: Genevie Ann M.D.   On: 01/21/2017 12:26   Dg Chest Port 1 View  Result Date: 01/21/2017 CLINICAL DATA:  Chest pain.  Altered mental status. EXAM: PORTABLE CHEST 1 VIEW COMPARISON:  09/16/2015 FINDINGS: The heart size and mediastinal contours are within normal limits. Both lungs are clear. The visualized skeletal structures are unremarkable. IMPRESSION: No acute abnormality.  Aortic atherosclerosis. Electronically Signed   By: Lorriane Shire M.D.   On: 01/21/2017 09:48       Discharge  Exam: Vitals:   01/22/17 0442 01/22/17 1334  BP: (!) 138/56 (!) 153/68  Pulse: (!) 59 68  Resp: 16 16  Temp: 97.8 F (36.6 C) (!) 97.5 F (36.4 C)   Vitals:   01/21/17 2250 01/21/17 2252 01/22/17 0442 01/22/17 1334  BP: 136/65  (!) 138/56 (!) 153/68  Pulse: 67  (!) 59 68  Resp: 16  16 16   Temp: 97.9 F (36.6 C)  97.8 F (36.6 C) (!) 97.5 F (36.4 C)  TempSrc: Oral  Oral Oral  SpO2: 100%  98% 100%  Weight:  80.3 kg (177 lb)    Height:  5\' 4"  (1.626 m)      General: Pt is alert, awake, not in acute distress Cardiovascular: RRR, S1/S2 +, no rubs, no gallops Respiratory: CTA bilaterally, no wheezing, no rhonchi Abdominal: Soft, NT, ND, bowel sounds + Extremities: no edema, no cyanosis    The results of significant diagnostics from this hospitalization (including imaging, microbiology, ancillary and laboratory) are listed below for reference.     Microbiology: Recent Results (from the past 240 hour(s))  Urine culture     Status: Abnormal (Preliminary result)   Collection Time: 01/21/17  9:40 AM  Result Value Ref Range Status   Specimen Description URINE, CLEAN CATCH  Final   Special Requests NONE  Final   Culture >=100,000 COLONIES/mL ENTEROCOCCUS FAECALIS (A)  Final   Report Status PENDING  Incomplete     Labs: BNP (last 3 results)  No results for input(s): BNP in the last 8760 hours. Basic Metabolic Panel:  Recent Labs Lab 01/21/17 0924  NA 141  K 3.8  CL 106  CO2 25  GLUCOSE 85  BUN 10  CREATININE 0.73  CALCIUM 9.7   Liver Function Tests:  Recent Labs Lab 01/21/17 0924  AST 23  ALT 19  ALKPHOS 64  BILITOT 0.6  PROT 6.8  ALBUMIN 3.9   No results for input(s): LIPASE, AMYLASE in the last 168 hours. No results for input(s): AMMONIA in the last 168 hours. CBC:  Recent Labs Lab 01/21/17 0924  WBC 3.8*  NEUTROABS 1.8  HGB 15.0  HCT 42.5  MCV 90.0  PLT 185   Cardiac Enzymes: No results for input(s): CKTOTAL, CKMB, CKMBINDEX, TROPONINI  in the last 168 hours. BNP: Invalid input(s): POCBNP CBG: No results for input(s): GLUCAP in the last 168 hours. D-Dimer No results for input(s): DDIMER in the last 72 hours. Hgb A1c No results for input(s): HGBA1C in the last 72 hours. Lipid Profile No results for input(s): CHOL, HDL, LDLCALC, TRIG, CHOLHDL, LDLDIRECT in the last 72 hours. Thyroid function studies No results for input(s): TSH, T4TOTAL, T3FREE, THYROIDAB in the last 72 hours.  Invalid input(s): FREET3 Anemia work up No results for input(s): VITAMINB12, FOLATE, FERRITIN, TIBC, IRON, RETICCTPCT in the last 72 hours. Urinalysis    Component Value Date/Time   COLORURINE STRAW (A) 01/21/2017 0940   APPEARANCEUR CLEAR 01/21/2017 0940   LABSPEC 1.003 (L) 01/21/2017 0940   PHURINE 8.0 01/21/2017 0940   GLUCOSEU NEGATIVE 01/21/2017 0940   GLUCOSEU NEGATIVE 12/09/2016 1104   HGBUR NEGATIVE 01/21/2017 0940   BILIRUBINUR NEGATIVE 01/21/2017 0940   BILIRUBINUR Negative 12/09/2016 1048   KETONESUR NEGATIVE 01/21/2017 0940   PROTEINUR NEGATIVE 01/21/2017 0940   UROBILINOGEN 0.2 12/09/2016 1104   NITRITE NEGATIVE 01/21/2017 0940   LEUKOCYTESUR NEGATIVE 01/21/2017 0940   Sepsis Labs Invalid input(s): PROCALCITONIN,  WBC,  LACTICIDVEN Microbiology Recent Results (from the past 240 hour(s))  Urine culture     Status: Abnormal (Preliminary result)   Collection Time: 01/21/17  9:40 AM  Result Value Ref Range Status   Specimen Description URINE, CLEAN CATCH  Final   Special Requests NONE  Final   Culture >=100,000 COLONIES/mL ENTEROCOCCUS FAECALIS (A)  Final   Report Status PENDING  Incomplete     Time coordinating discharge: 40 minutes  SIGNED:  Dessa Phi, DO [facility] Pager [phone]  If 7PM-7AM, please contact night-coverage [email or web address] Password [email or web address] 01/22/2017, 1:35 PM

## 2017-01-22 NOTE — Progress Notes (Signed)
Patient discharged to home, all discharge medications and instructions reviewed and questions answered.  Patient to be assisted to vehicle by wheelchair.  

## 2017-01-22 NOTE — Care Management Note (Signed)
Case Management Note  Patient Details  Name: Valerie Chen MRN: [id number] Date of Birth: 08-16-1928  Subjective/Objective:     Cementia, syncope, abdominal mass possible malignancy               Action/Plan: Discharge Planning: NCM spoke to pt's dtr, Rolm Bookbinder. States pt lives in her home and has 24 hour caregivers. Offered choice for Home Hospice/list provided. Dtr is requesting [facility]. Contacted [facility] with new referral. [facility] rep will meet with dtr this afternoon to arrange DME and initial appt. Dtr states she will need wheelchair and possibly a hospital bed.  PCP Briscoe Deutscher   Expected Discharge Date: 01/23/2017            Expected Discharge Plan:  Home w Hospice Care  In-House Referral:  NA  Discharge planning Services  CM Consult  Post Acute Care Choice:  Hospice Choice offered to:  Adult Children  DME Arranged:  Youth worker wheelchair with seat cushion, Hospital bed DME Agency:  [place]:  RN [facility] Agency:  [facility]  Status of Service:  Completed, signed off  If discussed at [place] of Stay Meetings, dates discussed:    Additional Comments:  Erenest Rasher, RN 01/22/2017, 11:00 AM

## 2017-01-22 NOTE — Care Management Obs Status (Signed)
[place] NOTIFICATION   Patient Details  Name: Valerie Chen MRN: [id number] Date of Birth: July 25, 1928   Medicare Observation Status Notification Given:  Yes    Erenest Rasher, RN 01/22/2017, 11:04 AM

## 2017-01-22 NOTE — Progress Notes (Signed)
[facility]) Hospital Liaison:  RN Visit.   Notified by Joen Laura, of patient/family request for [facility] services at home after discharge. Chart and patient information under review by [facility] physician. Hospice eligibility pending at this time. Writer spoke with daughter, Fraser Din at bedside to initiate education related to hospice philosophy, services and team approach to care.  Patient/family verbalized understanding of information given.  Per discussion, plan is for discharge to home by private vehicle  today.   Please send signed and completed DNR form home with patient/family.  Patient will need prescriptions for discharge comfort medications.  DME needs have been discussed, patient currently has the following equipment in the home:  Walker, 3n1, and shower chair.  Patient/family requests the following DME for delivery to the home: hospital bed, OBT and W/C.  Liaison contacted [facility] to arrange delivery to the home. [facility] equipment manager was notified of order.  Home address has been verified and is correct in the chart.  Rolm Bookbinder is the family member to contact to arrange time of delivery.  [facility]erral [facility] aware of the above.  Completed discharge summary will need to be faxed to [facility] at [phone] when final.  Please notify [facility] when patient is ready to leave the unit at discharge. (Call [phone] or [phone] after 5pm.)  [facility] information and contact numbers given to daughter,  Fraser Din during this visit.  Above information shared with Edwin Cap , [facility].  Please call with any hospice related questions. Thank you for this referral.  [facility] Liaison [phone] liaisons are now on [place].

## 2017-01-23 DIAGNOSIS — I509 Heart failure, unspecified: Secondary | ICD-10-CM | POA: Diagnosis not present

## 2017-01-23 DIAGNOSIS — D487 Neoplasm of uncertain behavior of other specified sites: Secondary | ICD-10-CM | POA: Diagnosis not present

## 2017-01-23 DIAGNOSIS — F339 Major depressive disorder, recurrent, unspecified: Secondary | ICD-10-CM | POA: Diagnosis not present

## 2017-01-23 DIAGNOSIS — G309 Alzheimer's disease, unspecified: Secondary | ICD-10-CM | POA: Diagnosis not present

## 2017-01-23 DIAGNOSIS — I2510 Atherosclerotic heart disease of native coronary artery without angina pectoris: Secondary | ICD-10-CM | POA: Diagnosis not present

## 2017-01-23 LAB — URINE CULTURE: Culture: >=100000 — AB

## 2017-01-24 DIAGNOSIS — I509 Heart failure, unspecified: Secondary | ICD-10-CM | POA: Diagnosis not present

## 2017-01-24 DIAGNOSIS — D487 Neoplasm of uncertain behavior of other specified sites: Secondary | ICD-10-CM | POA: Diagnosis not present

## 2017-01-24 DIAGNOSIS — G309 Alzheimer's disease, unspecified: Secondary | ICD-10-CM | POA: Diagnosis not present

## 2017-01-24 DIAGNOSIS — I2510 Atherosclerotic heart disease of native coronary artery without angina pectoris: Secondary | ICD-10-CM | POA: Diagnosis not present

## 2017-01-24 DIAGNOSIS — F339 Major depressive disorder, recurrent, unspecified: Secondary | ICD-10-CM | POA: Diagnosis not present

## 2017-01-24 LAB — GLUCOSE, CAPILLARY: Glucose-Capillary: 134 mg/dL — ABNORMAL HIGH (ref 65–99)

## 2017-01-24 NOTE — Telephone Encounter (Signed)
Patient has appointment scheduled on Tuesday 01/25/2017.

## 2017-01-25 ENCOUNTER — Ambulatory Visit: Payer: Medicare Other | Admitting: Family Medicine

## 2017-01-25 DIAGNOSIS — G309 Alzheimer's disease, unspecified: Secondary | ICD-10-CM | POA: Diagnosis not present

## 2017-01-25 DIAGNOSIS — I509 Heart failure, unspecified: Secondary | ICD-10-CM | POA: Diagnosis not present

## 2017-01-25 DIAGNOSIS — F339 Major depressive disorder, recurrent, unspecified: Secondary | ICD-10-CM | POA: Diagnosis not present

## 2017-01-25 DIAGNOSIS — I2510 Atherosclerotic heart disease of native coronary artery without angina pectoris: Secondary | ICD-10-CM | POA: Diagnosis not present

## 2017-01-25 DIAGNOSIS — D487 Neoplasm of uncertain behavior of other specified sites: Secondary | ICD-10-CM | POA: Diagnosis not present

## 2017-01-26 LAB — CULTURE, BLOOD (ROUTINE X 2)
CULTURE: NO GROWTH
Culture: NO GROWTH
SPECIAL REQUESTS: ADEQUATE
Special Requests: ADEQUATE

## 2017-01-27 DIAGNOSIS — G309 Alzheimer's disease, unspecified: Secondary | ICD-10-CM | POA: Diagnosis not present

## 2017-01-27 DIAGNOSIS — I509 Heart failure, unspecified: Secondary | ICD-10-CM | POA: Diagnosis not present

## 2017-01-27 DIAGNOSIS — I2510 Atherosclerotic heart disease of native coronary artery without angina pectoris: Secondary | ICD-10-CM | POA: Diagnosis not present

## 2017-01-27 DIAGNOSIS — D487 Neoplasm of uncertain behavior of other specified sites: Secondary | ICD-10-CM | POA: Diagnosis not present

## 2017-01-27 DIAGNOSIS — F339 Major depressive disorder, recurrent, unspecified: Secondary | ICD-10-CM | POA: Diagnosis not present

## 2017-01-28 ENCOUNTER — Telehealth: Payer: Self-pay | Admitting: Family Medicine

## 2017-01-28 NOTE — Telephone Encounter (Signed)
Valerie Chen and Valerie Chen walked in this morning with several questions. The imaging and prognosis received from the ER doctors seem to be contradictory to the prognosis that hospice is giving. According to Valerie Chen, the patient is progressing too remarkably for the prognosis given at the hospital to be correct. They believe that there has been a mistake in diagnosing.  Valerie Mellow, RN is checking with Dr Juleen China now on how to proceed.

## 2017-01-28 NOTE — Telephone Encounter (Signed)
Patient and family are coming in for appointment on Monday.

## 2017-01-31 ENCOUNTER — Ambulatory Visit (INDEPENDENT_AMBULATORY_CARE_PROVIDER_SITE_OTHER): Admitting: Family Medicine

## 2017-01-31 ENCOUNTER — Encounter: Payer: Self-pay | Admitting: Family Medicine

## 2017-01-31 VITALS — BP 130/76 | HR 67 | Temp 97.5°F | Ht 64.0 in | Wt 180.8 lb

## 2017-01-31 DIAGNOSIS — G8929 Other chronic pain: Secondary | ICD-10-CM | POA: Diagnosis not present

## 2017-01-31 DIAGNOSIS — R1900 Intra-abdominal and pelvic swelling, mass and lump, unspecified site: Secondary | ICD-10-CM | POA: Diagnosis not present

## 2017-01-31 DIAGNOSIS — M545 Low back pain: Secondary | ICD-10-CM | POA: Diagnosis not present

## 2017-01-31 NOTE — Patient Instructions (Addendum)
Give Tylenol 500 mg three times daily, scheduled.  Give with food.  Offer hydrocodone twice daily if needed.  Continue Seroquel.  We have placed a referral to Oncology, Dr. Antonieta Pert office.  They will contact you to schedule an appointment.

## 2017-01-31 NOTE — Progress Notes (Signed)
Valerie Chen is a 83 y.o. female is here for follow up.  History of Present Illness:   Water quality scientist, CMA, acting as scribe for Dr. Juleen China.  HPI:  Patient is here today to follow up from recent hospital visit.  Patient has been diagnosed with a mass in her abdomen.  Patient is accompanied by her daughter and a caregiver today.  They have also called patient's other daughter and have her on speaker phone during the visit today.  Family is questioning the diagnosis from the hospital.  Patient is having a lot of back pain and has been told she is here today to discuss her back pain.  She has not been told about the mass in her abdomen.  Patient complains sometimes of pain in the low back.  She also complains off and on of urinary symptoms but when tested does not have UTI.  Patient's appetite has been good, per family, and her bowel habits are regular.  Family would like to discuss hospice care.  Family states that she sometimes will not complain of any pain but they can read her body language well enough to know when she is in pain.  They do not feel they need referral to a surgeon at this time.  They would like to know the best options to give patient optimal quality of life for her remaining time.    Health Maintenance Due  Topic Date Due  . TETANUS/TDAP  10/12/1949  . INFLUENZA VACCINE  01/19/2017   Depression screen PHQ 2/9 11/20/2014  Decreased Interest 0  Down, Depressed, Hopeless 0  PHQ - 2 Score 0   PMHx, SurgHx, SocialHx, FamHx, Medications, and Allergies were reviewed in the Visit Navigator and updated as appropriate.   Patient Active Problem List   Diagnosis Date Noted  . Dementia 01/21/2017  . Abdominal mass 01/21/2017  . NSVT (nonsustained ventricular tachycardia) ([place]) 05/11/2016  . Coarse tremors 05/06/2016  . Syncope 12/29/2015  . Recurrent UTI 02/01/2015  . Essential hypertension 05/30/2013  . Edema   . Psychosis in elderly 03/12/2011  . Coronary artery disease     . Migraine headache   . Diastolic dysfunction   . TIA (transient ischemic attack)   . GI bleed   . Dyslipidemia   . Pain in joints 05/22/2009  . Vitamin D deficiency 01/27/2009  . Anemia 11/21/2008   Social History  Substance Use Topics  . Smoking status: Never Smoker  . Smokeless tobacco: Never Used  . Alcohol use No   Current Medications and Allergies:   Current Outpatient Prescriptions:  .  acetaminophen (TYLENOL) 500 MG tablet, Take 1,000 mg by mouth every 6 (six) hours as needed for moderate pain., Disp: , Rfl:  .  Cholecalciferol (VITAMIN D-3) 1000 units CAPS, Take 1,000 Units by mouth daily. , Disp: , Rfl:  .  Cranberry (SM CRANBERRY) 300 MG tablet, Take 300 mg by mouth 3 (three) times daily. , Disp: , Rfl:  .  cyanocobalamin 500 MCG tablet, Take 500 mcg by mouth daily. , Disp: , Rfl:  .  docusate sodium (COLACE) 100 MG capsule, Take 200 mg by mouth daily., Disp: , Rfl:  .  HYDROcodone-acetaminophen (NORCO/VICODIN) 5-325 MG tablet, Take 1 tablet by mouth every 6 (six) hours as needed., Disp: 10 tablet, Rfl: 0 .  nystatin-triamcinolone ointment (MYCOLOG), Apply 1 application topically 2 (two) times daily., Disp: 30 g, Rfl: 2 .  polyethylene glycol (MIRALAX / GLYCOLAX) packet, Take 17 g by mouth daily., Disp:  14 each, Rfl: 0 .  QUEtiapine (SEROQUEL) 25 MG tablet, Take 1 tablet (25 mg total) by mouth every evening. 6pm, Disp: 90 tablet, Rfl: 2   Allergies  Allergen Reactions  . Levaquin [Levofloxacin] Other (See Comments)    Reaction:  Altered mental status   . Statins Other (See Comments)    Severe myalgias   Review of Systems   Pertinent items are noted in the HPI. Otherwise, ROS is negative.  Vitals:   Vitals:   01/31/17 1133  BP: 130/76  Pulse: 67  Temp: (!) 97.5 F (36.4 C)  TempSrc: Oral  SpO2: 96%  Weight: 180 lb 12.8 oz (82 kg)  Height: 5\' 4"  (1.626 m)     Body mass index is 31.03 kg/m. Physical Exam:   Physical Exam  Constitutional: She appears  well-nourished.  Pleasant, demented. Obviously difficult time standing and walking due to pain - mostly in back.  HENT:  Head: Normocephalic and atraumatic.  Eyes: Pupils are equal, round, and reactive to light. EOM are normal.  Neck: Normal range of motion. Neck supple.  Cardiovascular: Normal rate, regular rhythm, normal heart sounds and intact distal pulses.   Pulmonary/Chest: Effort normal.  Abdominal: Soft.  Skin: Skin is warm.  Psychiatric: She has a normal mood and affect. Her behavior is normal.  Nursing note and vitals reviewed.   Assessment and Plan:   Valerie Chen was seen today for follow-up.  Diagnoses and all orders for this visit:  Abdominal mass, unspecified abdominal location Comments: The patient's daughters are interested in discussing expectations re: the abdominal mass with Oncology in the Hospice setting.  Orders: -     Ambulatory referral to Hematology / Oncology  Chronic midline low back pain, with sciatica presence unspecified Comments: We reviewed pain control options for this patient. Will schedule Tylenol 500 mg po TID nad use Norco prn between.   . Reviewed expectations re: course of current medical issues. . Discussed self-management of symptoms. . Outlined signs and symptoms indicating need for more acute intervention. . Patient verbalized understanding and all questions were answered. Marland Kitchen Health Maintenance issues including appropriate healthy diet, exercise, and smoking avoidance were discussed with patient. . See orders for this visit as documented in the electronic medical record. . Patient received an After Visit Summary.  CMA served as Education administrator during this visit. History, Physical, and Plan performed by medical provider. The above documentation has been reviewed and is accurate and complete. Briscoe Deutscher, D.O.  Briscoe Deutscher, DO [place], [place] 02/13/2017  Future Appointments Date Time Provider Omar  02/18/2017 10:30 AM  [facility] None  02/18/2017 11:00 AM [place], Rudell Cobb, MD [facility] None

## 2017-02-02 ENCOUNTER — Telehealth: Payer: Self-pay | Admitting: Hematology & Oncology

## 2017-02-02 NOTE — Telephone Encounter (Signed)
Called Patient's daughter Fraser Din to schedule a New Patient appt with Dr. Marin Olp at [facility]. Fraser Din stated that she wants to consult with Hospice before scheduling any appts with Dr. Marin Olp.       [facility] 2018  [facility].

## 2017-02-03 DIAGNOSIS — I2510 Atherosclerotic heart disease of native coronary artery without angina pectoris: Secondary | ICD-10-CM | POA: Diagnosis not present

## 2017-02-03 DIAGNOSIS — I509 Heart failure, unspecified: Secondary | ICD-10-CM | POA: Diagnosis not present

## 2017-02-03 DIAGNOSIS — F339 Major depressive disorder, recurrent, unspecified: Secondary | ICD-10-CM | POA: Diagnosis not present

## 2017-02-03 DIAGNOSIS — D487 Neoplasm of uncertain behavior of other specified sites: Secondary | ICD-10-CM | POA: Diagnosis not present

## 2017-02-03 DIAGNOSIS — G309 Alzheimer's disease, unspecified: Secondary | ICD-10-CM | POA: Diagnosis not present

## 2017-02-07 DIAGNOSIS — I2510 Atherosclerotic heart disease of native coronary artery without angina pectoris: Secondary | ICD-10-CM | POA: Diagnosis not present

## 2017-02-07 DIAGNOSIS — D487 Neoplasm of uncertain behavior of other specified sites: Secondary | ICD-10-CM | POA: Diagnosis not present

## 2017-02-07 DIAGNOSIS — G309 Alzheimer's disease, unspecified: Secondary | ICD-10-CM | POA: Diagnosis not present

## 2017-02-07 DIAGNOSIS — F339 Major depressive disorder, recurrent, unspecified: Secondary | ICD-10-CM | POA: Diagnosis not present

## 2017-02-07 DIAGNOSIS — I509 Heart failure, unspecified: Secondary | ICD-10-CM | POA: Diagnosis not present

## 2017-02-08 DIAGNOSIS — D487 Neoplasm of uncertain behavior of other specified sites: Secondary | ICD-10-CM | POA: Diagnosis not present

## 2017-02-08 DIAGNOSIS — G309 Alzheimer's disease, unspecified: Secondary | ICD-10-CM | POA: Diagnosis not present

## 2017-02-08 DIAGNOSIS — I2510 Atherosclerotic heart disease of native coronary artery without angina pectoris: Secondary | ICD-10-CM | POA: Diagnosis not present

## 2017-02-08 DIAGNOSIS — F339 Major depressive disorder, recurrent, unspecified: Secondary | ICD-10-CM | POA: Diagnosis not present

## 2017-02-08 DIAGNOSIS — I509 Heart failure, unspecified: Secondary | ICD-10-CM | POA: Diagnosis not present

## 2017-02-09 DIAGNOSIS — D487 Neoplasm of uncertain behavior of other specified sites: Secondary | ICD-10-CM | POA: Diagnosis not present

## 2017-02-09 DIAGNOSIS — I509 Heart failure, unspecified: Secondary | ICD-10-CM | POA: Diagnosis not present

## 2017-02-09 DIAGNOSIS — G309 Alzheimer's disease, unspecified: Secondary | ICD-10-CM | POA: Diagnosis not present

## 2017-02-09 DIAGNOSIS — F339 Major depressive disorder, recurrent, unspecified: Secondary | ICD-10-CM | POA: Diagnosis not present

## 2017-02-09 DIAGNOSIS — I2510 Atherosclerotic heart disease of native coronary artery without angina pectoris: Secondary | ICD-10-CM | POA: Diagnosis not present

## 2017-02-10 ENCOUNTER — Other Ambulatory Visit: Payer: Self-pay | Admitting: *Deleted

## 2017-02-10 DIAGNOSIS — R1900 Intra-abdominal and pelvic swelling, mass and lump, unspecified site: Secondary | ICD-10-CM

## 2017-02-16 DIAGNOSIS — I2510 Atherosclerotic heart disease of native coronary artery without angina pectoris: Secondary | ICD-10-CM | POA: Diagnosis not present

## 2017-02-16 DIAGNOSIS — G309 Alzheimer's disease, unspecified: Secondary | ICD-10-CM | POA: Diagnosis not present

## 2017-02-16 DIAGNOSIS — I509 Heart failure, unspecified: Secondary | ICD-10-CM | POA: Diagnosis not present

## 2017-02-16 DIAGNOSIS — F339 Major depressive disorder, recurrent, unspecified: Secondary | ICD-10-CM | POA: Diagnosis not present

## 2017-02-16 DIAGNOSIS — D487 Neoplasm of uncertain behavior of other specified sites: Secondary | ICD-10-CM | POA: Diagnosis not present

## 2017-02-17 ENCOUNTER — Encounter (HOSPITAL_COMMUNITY): Payer: Self-pay | Admitting: Emergency Medicine

## 2017-02-17 ENCOUNTER — Telehealth: Payer: Self-pay | Admitting: Family Medicine

## 2017-02-17 ENCOUNTER — Inpatient Hospital Stay (HOSPITAL_COMMUNITY)
Admission: EM | Admit: 2017-02-17 | Discharge: 2017-02-19 | DRG: 872 | Disposition: A | Discharge status: Home / Self Care | Payer: Medicare Other | Attending: Internal Medicine | Admitting: Internal Medicine

## 2017-02-17 ENCOUNTER — Emergency Department (HOSPITAL_COMMUNITY): Payer: Medicare Other

## 2017-02-17 DIAGNOSIS — Z90710 Acquired absence of both cervix and uterus: Secondary | ICD-10-CM

## 2017-02-17 DIAGNOSIS — R509 Fever, unspecified: Secondary | ICD-10-CM | POA: Diagnosis not present

## 2017-02-17 DIAGNOSIS — G459 Transient cerebral ischemic attack, unspecified: Secondary | ICD-10-CM | POA: Diagnosis not present

## 2017-02-17 DIAGNOSIS — I2510 Atherosclerotic heart disease of native coronary artery without angina pectoris: Secondary | ICD-10-CM | POA: Diagnosis not present

## 2017-02-17 DIAGNOSIS — F339 Major depressive disorder, recurrent, unspecified: Secondary | ICD-10-CM | POA: Diagnosis not present

## 2017-02-17 DIAGNOSIS — G309 Alzheimer's disease, unspecified: Secondary | ICD-10-CM | POA: Diagnosis present

## 2017-02-17 DIAGNOSIS — I5189 Other ill-defined heart diseases: Secondary | ICD-10-CM | POA: Diagnosis present

## 2017-02-17 DIAGNOSIS — I951 Orthostatic hypotension: Secondary | ICD-10-CM | POA: Diagnosis present

## 2017-02-17 DIAGNOSIS — N390 Urinary tract infection, site not specified: Secondary | ICD-10-CM | POA: Diagnosis not present

## 2017-02-17 DIAGNOSIS — Z8261 Family history of arthritis: Secondary | ICD-10-CM

## 2017-02-17 DIAGNOSIS — Z79899 Other long term (current) drug therapy: Secondary | ICD-10-CM

## 2017-02-17 DIAGNOSIS — I110 Hypertensive heart disease with heart failure: Secondary | ICD-10-CM | POA: Diagnosis present

## 2017-02-17 DIAGNOSIS — R1900 Intra-abdominal and pelvic swelling, mass and lump, unspecified site: Secondary | ICD-10-CM | POA: Diagnosis present

## 2017-02-17 DIAGNOSIS — J189 Pneumonia, unspecified organism: Secondary | ICD-10-CM | POA: Diagnosis not present

## 2017-02-17 DIAGNOSIS — I509 Heart failure, unspecified: Secondary | ICD-10-CM | POA: Diagnosis not present

## 2017-02-17 DIAGNOSIS — F0280 Dementia in other diseases classified elsewhere without behavioral disturbance: Secondary | ICD-10-CM | POA: Diagnosis present

## 2017-02-17 DIAGNOSIS — Z9889 Other specified postprocedural states: Secondary | ICD-10-CM

## 2017-02-17 DIAGNOSIS — R05 Cough: Secondary | ICD-10-CM | POA: Diagnosis not present

## 2017-02-17 DIAGNOSIS — D473 Essential (hemorrhagic) thrombocythemia: Secondary | ICD-10-CM | POA: Diagnosis not present

## 2017-02-17 DIAGNOSIS — Z818 Family history of other mental and behavioral disorders: Secondary | ICD-10-CM

## 2017-02-17 DIAGNOSIS — Z8249 Family history of ischemic heart disease and other diseases of the circulatory system: Secondary | ICD-10-CM

## 2017-02-17 DIAGNOSIS — F0390 Unspecified dementia without behavioral disturbance: Secondary | ICD-10-CM | POA: Diagnosis not present

## 2017-02-17 DIAGNOSIS — I5032 Chronic diastolic (congestive) heart failure: Secondary | ICD-10-CM | POA: Diagnosis present

## 2017-02-17 DIAGNOSIS — A419 Sepsis, unspecified organism: Principal | ICD-10-CM | POA: Diagnosis present

## 2017-02-17 DIAGNOSIS — R9431 Abnormal electrocardiogram [ECG] [EKG]: Secondary | ICD-10-CM | POA: Diagnosis not present

## 2017-02-17 DIAGNOSIS — Z888 Allergy status to other drugs, medicaments and biological substances status: Secondary | ICD-10-CM

## 2017-02-17 DIAGNOSIS — Z809 Family history of malignant neoplasm, unspecified: Secondary | ICD-10-CM

## 2017-02-17 DIAGNOSIS — Z66 Do not resuscitate: Secondary | ICD-10-CM | POA: Diagnosis present

## 2017-02-17 DIAGNOSIS — E876 Hypokalemia: Secondary | ICD-10-CM | POA: Diagnosis present

## 2017-02-17 DIAGNOSIS — D487 Neoplasm of uncertain behavior of other specified sites: Secondary | ICD-10-CM | POA: Diagnosis not present

## 2017-02-17 LAB — URINALYSIS, ROUTINE W REFLEX MICROSCOPIC
Bilirubin Urine: NEGATIVE
Glucose, UA: NEGATIVE mg/dL
Hgb urine dipstick: NEGATIVE
Leukocytes, UA: NEGATIVE
Nitrite: NEGATIVE
Protein, ur: NEGATIVE mg/dL
Specific Gravity, Urine: 1.015 (ref 1.005–1.030)
pH: 7.5 (ref 5.0–8.0)

## 2017-02-17 LAB — COMPREHENSIVE METABOLIC PANEL WITH GFR
ALT: 21 U/L (ref 14–54)
AST: 31 U/L (ref 15–41)
Albumin: 3.7 g/dL (ref 3.5–5.0)
Alkaline Phosphatase: 63 U/L (ref 38–126)
Anion gap: 11 (ref 5–15)
BUN: 10 mg/dL (ref 6–20)
CO2: 21 mmol/L — ABNORMAL LOW (ref 22–32)
Calcium: 8.9 mg/dL (ref 8.9–10.3)
Chloride: 105 mmol/L (ref 101–111)
Creatinine, Ser: 0.79 mg/dL (ref 0.44–1.00)
GFR calc Af Amer: >60 mL/min
GFR calc non Af Amer: >60 mL/min
Glucose, Bld: 182 mg/dL — ABNORMAL HIGH (ref 65–99)
Potassium: 3.6 mmol/L (ref 3.5–5.1)
Sodium: 137 mmol/L (ref 135–145)
Total Bilirubin: 0.7 mg/dL (ref 0.3–1.2)
Total Protein: 6.4 g/dL — ABNORMAL LOW (ref 6.5–8.1)

## 2017-02-17 LAB — LACTATE DEHYDROGENASE: LDH: 150 U/L (ref 98–192)

## 2017-02-17 LAB — CBC WITH DIFFERENTIAL/PLATELET
Basophils Absolute: 0 10*3/uL (ref 0.0–0.1)
Basophils Relative: 0 %
Eosinophils Absolute: 0 10*3/uL (ref 0.0–0.7)
Eosinophils Relative: 0 %
HCT: 41.8 % (ref 36.0–46.0)
Hemoglobin: 14.4 g/dL (ref 12.0–15.0)
Lymphocytes Relative: 5 %
Lymphs Abs: 0.6 10*3/uL — ABNORMAL LOW (ref 0.7–4.0)
MCH: 31.5 pg (ref 26.0–34.0)
MCHC: 34.4 g/dL (ref 30.0–36.0)
MCV: 91.5 fL (ref 78.0–100.0)
Monocytes Absolute: 0.8 10*3/uL (ref 0.1–1.0)
Monocytes Relative: 7 %
Neutro Abs: 10.3 10*3/uL — ABNORMAL HIGH (ref 1.7–7.7)
Neutrophils Relative %: 88 %
Platelets: 144 10*3/uL — ABNORMAL LOW (ref 150–400)
RBC: 4.57 MIL/uL (ref 3.87–5.11)
RDW: 13.1 % (ref 11.5–15.5)
WBC: 11.7 10*3/uL — ABNORMAL HIGH (ref 4.0–10.5)

## 2017-02-17 LAB — PROTIME-INR
INR: 1.01
Prothrombin Time: 13.2 s (ref 11.4–15.2)

## 2017-02-17 LAB — I-STAT CG4 LACTIC ACID, ED
Lactic Acid, Venous: 4.02 mmol/L (ref 0.5–1.9)
Lactic Acid, Venous: 0.99 mmol/L (ref 0.5–1.9)

## 2017-02-17 LAB — SAVE SMEAR

## 2017-02-17 MED ORDER — ENOXAPARIN SODIUM 40 MG/0.4ML ~~LOC~~ SOLN
40.0000 mg | SUBCUTANEOUS | Status: DC
  Administered 2017-02-17 – 2017-02-18 (×2): 40 mg via SUBCUTANEOUS
  Filled 2017-02-17 (×2): qty 0.4

## 2017-02-17 MED ORDER — SODIUM CHLORIDE 0.9 % IV BOLUS (SEPSIS)
1000.0000 mL | Freq: Once | INTRAVENOUS | Status: AC
  Administered 2017-02-17: 1000 mL via INTRAVENOUS

## 2017-02-17 MED ORDER — QUETIAPINE FUMARATE 25 MG PO TABS
25.0000 mg | ORAL_TABLET | Freq: Every evening | ORAL | Status: DC
  Administered 2017-02-17 – 2017-02-18 (×2): 25 mg via ORAL
  Filled 2017-02-17 (×2): qty 1

## 2017-02-17 MED ORDER — HYDROCODONE-ACETAMINOPHEN 5-325 MG PO TABS: 1.0000 | ORAL_TABLET | Freq: Four times a day (QID) | ORAL | Status: DC | PRN

## 2017-02-17 MED ORDER — DEXTROSE 5 % IV SOLN
1.0000 g | Freq: Three times a day (TID) | INTRAVENOUS | Status: DC
  Administered 2017-02-17 – 2017-02-19 (×5): 1 g via INTRAVENOUS
  Filled 2017-02-17 (×7): qty 1

## 2017-02-17 MED ORDER — SODIUM CHLORIDE 0.9 % IV BOLUS (SEPSIS)
500.0000 mL | Freq: Once | INTRAVENOUS | Status: AC
  Administered 2017-02-17: 500 mL via INTRAVENOUS

## 2017-02-17 MED ORDER — DEXTROSE 5 % IV SOLN
2.0000 g | Freq: Once | INTRAVENOUS | Status: AC
  Administered 2017-02-17: 2 g via INTRAVENOUS
  Filled 2017-02-17: qty 2

## 2017-02-17 MED ORDER — SODIUM CHLORIDE 0.9 % IV SOLN
INTRAVENOUS | Status: DC
  Administered 2017-02-17 – 2017-02-18 (×2): via INTRAVENOUS

## 2017-02-17 MED ORDER — VANCOMYCIN HCL IN DEXTROSE 1-5 GM/200ML-% IV SOLN
1000.0000 mg | Freq: Once | INTRAVENOUS | Status: AC
Start: 2017-02-17 — End: 2017-02-17
  Administered 2017-02-17: 1000 mg via INTRAVENOUS
  Filled 2017-02-17: qty 200

## 2017-02-17 NOTE — ED Provider Notes (Signed)
[facility] DEPT Provider Note   CSN: [id number] Arrival date & time: 02/17/17  1032     History   Chief Complaint Chief Complaint  Patient presents with  . Altered Mental Status  . Cough    HPI Valerie Chen is a 83 y.o. female.  HPI   83 year old female with a history of dementia, abdominal mesenteric mass, hypertension, on hospice care, presents with concern for altered mental status. Daughter reports that she is had mild cough, nasal congestion beginning yesterday. Yesterday she had fatigue, generalized weakness, however today had developed confusion. Reports changes from baseline include her inappropriately following commands, for example when they're taking her temperature at home she did not close her mouth around the thermometer. Reports typically she is able to sit up independently, however she has appeared significantly generally weak.  They reports she's had loose stools starting yesterday, 3 episodes yesterday and one today. Denies black or bloody stool. Denied vomiting. No fevers at home. Did have a temperature with EMS of 100.4 and was given tylenol.  Has not complained of abdominal pain or chest pain at home. Patient denies any symptoms at this time, but family reports she is a poor historian. No falls. No focal weakness or facial droop.    Past Medical History:  Diagnosis Date  . Alzheimer disease   . Arthritis   . CAD (coronary artery disease)    Probable CAD, nuclear February, 2012, possible anteroseptal and inferoseptal ischemia but the study was technically difficult  . Chest pain    Hospital February, 2012, nuclear, possible anteroseptal and inferoseptal ischemia, technically difficult, medical therapy  . Dementia    Significant  . Diastolic dysfunction    Echo, February, 2012  . Diastolic dysfunction   . DNR (do not resuscitate)   . Dyslipidemia   . Edema    hospitalizations September, 2013, some fluid overload  . Ejection fraction    EF 55%, echo,  February, 2012  //   EF 55-60%, echo, February 25, 2012 // Echo 12/17: EF 60-65, no RWMA, Gr 1 DD, normal RVSF  . Essential hypertension   . GI bleed    February, 2012 incomplete colonoscopy, hemorrhoids, needs complete colonoscopy  . Hx: UTI (urinary tract infection)   . Hyperlipidemia   . Leg pain, bilateral   . Major depression    2 overdoses in 1975 & 1977  . Migraines    25 year  . Orthostatic hypotension   . Suicide attempt ([place]) 1985   2 attempts with Valium  . TIA (transient ischemic attack)       ???question of a TIA in February, 2012, carotid Doppler, August 11, 2010 no significant abnormalities    Patient Active Problem List   Diagnosis Date Noted  . Sepsis ([place]) 02/17/2017  . Dementia 01/21/2017  . Abdominal mass 01/21/2017  . NSVT (nonsustained ventricular tachycardia) ([place]) 05/11/2016  . Coarse tremors 05/06/2016  . Syncope 12/29/2015  . HCAP (healthcare-associated pneumonia) 02/01/2015  . Recurrent UTI 02/01/2015  . Essential hypertension 05/30/2013  . Edema   . Psychosis in elderly 03/12/2011  . Coronary artery disease   . Migraine headache   . Diastolic dysfunction   . TIA (transient ischemic attack)   . GI bleed   . Dyslipidemia   . Pain in joints 05/22/2009  . Vitamin D deficiency 01/27/2009  . Anemia 11/21/2008    Past Surgical History:  Procedure Laterality Date  . ABDOMINAL HYSTERECTOMY    . DIAGNOSTIC MAMMOGRAM  12/29/2000  Mammogram, suspicious lesions -BX pending  . TONSILLECTOMY      OB History    No data available       Home Medications    Prior to Admission medications   Medication Sig Start Date End Date Taking? Authorizing Provider  acetaminophen (TYLENOL) 500 MG tablet Take 1,000 mg by mouth every 6 (six) hours as needed for moderate pain.   Yes [provider]  Cholecalciferol (VITAMIN D-3) 1000 units CAPS Take 1,000 Units by mouth daily.    Yes [provider]  Cranberry (SM CRANBERRY) 300 MG tablet  Take 300 mg by mouth 3 (three) times daily.    Yes [provider]  cyanocobalamin 500 MCG tablet Take 500 mcg by mouth daily.    Yes [provider]  docusate sodium (COLACE) 100 MG capsule Take 200 mg by mouth daily.   Yes [provider]  HYDROcodone-acetaminophen (NORCO/VICODIN) 5-325 MG tablet Take 1 tablet by mouth every 6 (six) hours as needed. 01/21/17  Yes Drenda Freeze, MD  QUEtiapine (SEROQUEL) 25 MG tablet Take 1 tablet (25 mg total) by mouth every evening. 6pm 12/06/16  Yes Briscoe Deutscher, DO  nystatin-triamcinolone ointment (MYCOLOG) Apply 1 application topically 2 (two) times daily. Patient not taking: Reported on 02/17/2017 12/06/16   Briscoe Deutscher, DO  polyethylene glycol (MIRALAX / GLYCOLAX) packet Take 17 g by mouth daily. Patient not taking: Reported on 02/17/2017 01/23/17   Dessa Phi Chahn-Yang, DO    Family History Family History  Problem Relation Age of Onset  . Heart attack Father        several heart attacks  . Cancer Father        bone  . Arthritis Father   . Suicidality Mother   . Depression Mother   . Heart attack Brother   . Heart disease Brother   . Pneumonia Brother   . Suicidality Son   . Hypertension Daughter   . Depression Other        Most family members    Social History Social History  Substance Use Topics  . Smoking status: Never Smoker  . Smokeless tobacco: Never Used  . Alcohol use No     Allergies   Levaquin [levofloxacin] and Statins   Review of Systems Review of Systems  Constitutional: Positive for fatigue. Negative for fever.  HENT: Positive for congestion. Negative for sore throat.   Eyes: Negative for visual disturbance.  Respiratory: Positive for cough. Negative for shortness of breath.   Cardiovascular: Negative for chest pain.  Gastrointestinal: Positive for diarrhea. Negative for abdominal pain, constipation, nausea and vomiting.  Genitourinary: Negative for difficulty urinating.    Musculoskeletal: Negative for back pain and neck pain.  Skin: Negative for rash.  Neurological: Negative for syncope and headaches.  Psychiatric/Behavioral: Positive for confusion.     Physical Exam Updated Vital Signs BP (!) 122/49 (BP Location: Left Arm)   Pulse 76   Temp 98.5 F (36.9 C) (Oral)   Resp 22   Ht 5\' 4"  (1.626 m)   Wt 81.6 kg (180 lb)   SpO2 99%   BMI 30.90 kg/m   Physical Exam  Constitutional: She appears well-developed and well-nourished. No distress.  HENT:  Head: Normocephalic and atraumatic.  Eyes: Conjunctivae are normal.  Neck: Normal range of motion.  Cardiovascular: Normal rate, regular rhythm, normal heart sounds and intact distal pulses.  Exam reveals no gallop and no friction rub.   No murmur heard. Pulmonary/Chest: Effort normal and breath  sounds normal. No respiratory distress. She has no wheezes. She has no rales.  Abdominal: Soft. She exhibits no distension. There is no tenderness. There is no guarding.  Musculoskeletal: She exhibits no edema or tenderness.  Neurological: She is alert. She has normal strength. No sensory deficit.  Skin: Skin is warm and dry. No rash noted. She is not diaphoretic. No erythema.  Nursing note and vitals reviewed.    ED Treatments / Results  Labs (all labs ordered are listed, but only abnormal results are displayed) Labs Reviewed  COMPREHENSIVE METABOLIC PANEL - Abnormal; Notable for the following:       Result Value   CO2 21 (*)    Glucose, Bld 182 (*)    Total Protein 6.4 (*)    All other components within normal limits  CBC WITH DIFFERENTIAL/PLATELET - Abnormal; Notable for the following:    WBC 11.7 (*)    Platelets 144 (*)    Neutro Abs 10.3 (*)    Lymphs Abs 0.6 (*)    All other components within normal limits  URINALYSIS, ROUTINE W REFLEX MICROSCOPIC - Abnormal; Notable for the following:    Ketones, ur TRACE (*)    All other components within normal limits  I-STAT CG4 LACTIC ACID, ED -  Abnormal; Notable for the following:    Lactic Acid, Venous 4.02 (*)    All other components within normal limits  CULTURE, BLOOD (ROUTINE X 2)  CULTURE, BLOOD (ROUTINE X 2)  PROTIME-INR  LACTATE DEHYDROGENASE  SAVE SMEAR  COMPREHENSIVE METABOLIC PANEL  CBC  LYMPHOCYTE SUBSETS, FLOW CYTOMETRY (INPT)  CEA  I-STAT CG4 LACTIC ACID, ED    EKG  EKG Interpretation  Date/Time:  Thursday February 17 2017 12:54:05 EDT Ventricular Rate:  71 PR Interval:    QRS Duration: 91 QT Interval:  412 QTC Calculation: 448 R Axis:   88 Text Interpretation:  Sinus rhythm Borderline right axis deviation Low voltage, precordial leads No significant change since last tracing Confirmed by Gareth Morgan [phone]) on 02/17/2017 9:24:49 PM       Radiology Dg Chest 2 View  Result Date: 02/17/2017 CLINICAL DATA:  Fever, cough and altered mental status. EXAM: CHEST  2 VIEW COMPARISON:  01/21/2017 FINDINGS: Cardiomediastinal silhouette is normal. Mediastinal contours appear intact. There is no evidence of focal airspace consolidation, pleural effusion or pneumothorax. Osseous structures are without acute abnormality. Soft tissues are grossly normal. IMPRESSION: No active cardiopulmonary disease. Electronically Signed   By: Fidela Salisbury M.D.   On: 02/17/2017 11:16    Procedures Procedures (including critical care time)  Medications Ordered in ED Medications  HYDROcodone-acetaminophen (NORCO/VICODIN) 5-325 MG per tablet 1 tablet (not administered)  QUEtiapine (SEROQUEL) tablet 25 mg (25 mg Oral Given 02/17/17 1736)  enoxaparin (LOVENOX) injection 40 mg (40 mg Subcutaneous Given 02/17/17 2103)  0.9 %  sodium chloride infusion ( Intravenous New Bag/Given 02/17/17 2103)  ceFEPIme (MAXIPIME) 1 g in dextrose 5 % 50 mL IVPB (1 g Intravenous New Bag/Given 02/17/17 2102)  sodium chloride 0.9 % bolus 1,000 mL (0 mLs Intravenous Stopped 02/17/17 1344)    And  sodium chloride 0.9 % bolus 1,000 mL (0 mLs Intravenous  Stopped 02/17/17 1535)    And  sodium chloride 0.9 % bolus 500 mL (0 mLs Intravenous Stopped 02/17/17 1535)  ceFEPIme (MAXIPIME) 2 g in dextrose 5 % 50 mL IVPB (0 g Intravenous Stopped 02/17/17 1344)  vancomycin (VANCOCIN) IVPB 1000 mg/200 mL premix (0 mg Intravenous Stopped 02/17/17 1535)  Initial Impression / Assessment and Plan / ED Course  I have reviewed the triage vital signs and the nursing notes.  Pertinent labs & imaging results that were available during my care of the patient were reviewed by me and considered in my medical decision making (see chart for details).     83 year old female with a history of dementia, abdominal mesenteric mass, hypertension, on hospice care, presents with concern for altered mental status. Family reports they would like admission and treatment for infection although she had been comfort care.   CXR without acute abnormalities. Patient with generalized weakness, mild leukocytosis, lactic acid 4, and have concern for sepsis.  Possible early pneumonia occult on XR at this time (clinically concerned given cough) or UTI (urinalysis pending at this time.) No other signs of rash, meningitis, intraabdominal cause.  Given 30cc/kg normal saline, vancomycin and cefepime. Will admit to hospitalist for further care.   Final Clinical Impressions(s) / ED Diagnoses   Final diagnoses:  Sepsis, due to unspecified organism [facility])    New Prescriptions Current Discharge Medication List       Gareth Morgan, MD 02/17/17 2137

## 2017-02-17 NOTE — Progress Notes (Signed)
Pharmacy Antibiotic Note  Petrice Beedy is a 83 y.o. female admitted on 02/17/2017 with pneumonia.  Pharmacy has been consulted for Cefepime dosing. Mild leukocytosis & febrile since admission, however subjective reports of fever + cough, altered mental status.   Renal function at baseline.  CrCl >71ml/min.   Plan: Cefepime 1gm IV q8h No dose adjustments anticipated- pharmacy to sign off.  Please re-consult if needed.   Height: 5\' 4"  (162.6 cm) Weight: 180 lb (81.6 kg) IBW/kg (Calculated) : 54.7  Temp (24hrs), Avg:99.1 F (37.3 C), Min:98.5 F (36.9 C), Max:99.6 F (37.6 C)   Recent Labs Lab 02/17/17 1137 02/17/17 1144 02/17/17 1403  WBC 11.7*  --   --   CREATININE 0.79  --   --   LATICACIDVEN  --  4.02* 0.99    Estimated Creatinine Clearance: 52.2 mL/min (by C-G formula based on SCr of 0.79 mg/dL).    Allergies  Allergen Reactions  . Levaquin [Levofloxacin] Other (See Comments)    Reaction:  Altered mental status   . Statins Other (See Comments)    Severe myalgias    Antimicrobials this admission: 8/30 Cefepime >>  8/30 Vanc x1 in ED   Dose adjustments this admission:  Microbiology results: 8/30 BCx:   Thank you for allowing pharmacy to be a part of this patient's care.  Biagio Borg 02/17/2017 3:04 PM

## 2017-02-17 NOTE — Telephone Encounter (Signed)
Noted.  Will provide prescriptions to [name] when patient is discharged.

## 2017-02-17 NOTE — ED Triage Notes (Signed)
Per EMS family requesting evaluation related to pt cough and confusion noted this morning; pt usually alert and oriented "with confusion at times." Pt confusion worse today. EMS gave 1 gram of tylenol and 500 ml NS en route to hospital.

## 2017-02-17 NOTE — Telephone Encounter (Signed)
Valerie Chen, home nurse advising that patient is admitted to [facility] with Pneumonia and UTI. A work up abdominal mass will occur.   Revoked hospice service b/c patient needs treatment.  Hospital bed, overbed table and a wheelchair through [facility] care.   Will need to send prescriptions for those supplies so she can have in her home once she's discharged.  Thank you,  -LL

## 2017-02-17 NOTE — ED Notes (Signed)
Bed: QM08 Expected date:  Expected time:  Means of arrival:  Comments: EMS-fever/altered

## 2017-02-17 NOTE — Progress Notes (Signed)
A consult was received from an ED physician for Vanc/Cefepime per pharmacy dosing.  The patient's profile has been reviewed for ht/wt/allergies/indication/available labs.   A one time order has been placed for Vanc 1gm, Cefepime 2gm.  Further antibiotics/pharmacy consults should be ordered by admitting physician if indicated.                       Thank you, Netta Cedars, PharmD, BCPS 02/17/2017@11 :48 AM

## 2017-02-17 NOTE — ED Notes (Signed)
Abnormal lab result MD Schlossman have been made aware 

## 2017-02-17 NOTE — Progress Notes (Signed)
[facility]: RN visit   Patient brought by EMS to [facility] ED for evaluation of cough and change in mental status.  Patient is resting/sleeping and appears comfortable at this time. Daughter at bedside.   Daughter expressed interest in [place] services if needed in order to pursue treatment.  Daughter was advised to see what the diagnosis is in order to make an informed decision and she agreed to this.   Luan Moore, CSW for [facility] notified of daughter's wishes and will follow up.   Will continue to follow and anticipate needs.  Thank you,  Farrel Gordon, [facility] Liaison [phone]

## 2017-02-17 NOTE — H&P (Addendum)
History and Physical    Valerie Chen OYD:[id number] DOB: Oct 31, 1928 DOA: 02/17/2017  I have briefly reviewed the patient's prior medical records in [place]  PCP: Briscoe Deutscher, DO  Patient coming from: Home  Chief Complaint: Confusion, fever, URI type symptoms  HPI: Valerie Chen is a 83 y.o. 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  Patient is quite demented and unable to contribute to the story.  Patient's daughter reports no complaints of pain, no abdominal pain, no chest pain nothing that the patient reported to the family.  The caregiver does say that patient has had 2 episodes of projectile vomiting yesterday.  They report that patient is usually quite mobile, has underlying dementia but has no difficulties eating drinking and moving around the house.  Daughter tells me that she rescinded hospice, as she was told by the hospice RNs that "may be too early for hospice".  They state they talked to Dr Antonieta Pert office and he plans to work her up with several labs to figure out what the mass is about.  ED Course: In the ED she is afebrile, normal heart rate, normal blood pressure and satting well on room air.  She has slight leukocytosis with a white count of 11.7.  Her lactic acid was found to be elevated at 4.0.  Chest x-ray did not show any significant airspace disease.  She was given fluids, broad-spectrum IV antibiotics for concern for HCAP and TRH was asked for admission  Review of Systems: As per HPI otherwise 10 point review of systems negative.   Past Medical History:  Diagnosis  Date  . Alzheimer disease   . Arthritis   . CAD (coronary artery disease)    Probable CAD, nuclear February, 2012, possible anteroseptal and inferoseptal ischemia but the study was technically difficult  . Chest pain    Hospital February, 2012, nuclear, possible anteroseptal and inferoseptal ischemia, technically difficult, medical therapy  . Dementia    Significant  . Diastolic dysfunction    Echo, February, 2012  . Diastolic dysfunction   . DNR (do not resuscitate)   . Dyslipidemia   . Edema    hospitalizations September, 2013, some fluid overload  . Ejection fraction    EF 55%, echo, February, 2012  //   EF 55-60%, echo, February 25, 2012 // Echo 12/17: EF 60-65, no RWMA, Gr 1 DD, normal RVSF  . Essential hypertension   . GI bleed    February, 2012 incomplete colonoscopy, hemorrhoids, needs complete colonoscopy  . Hx: UTI (urinary tract infection)   . Hyperlipidemia   . Leg pain, bilateral   . Major depression    2 overdoses in 1975 & 1977  . Migraines    25 year  . Orthostatic hypotension   . Suicide attempt ([place]) 1985   2 attempts with Valium  . TIA (transient ischemic attack)       ???question of a TIA in February, 2012, carotid Doppler, August 11, 2010 no significant abnormalities    Past Surgical History:  Procedure Laterality Date  . ABDOMINAL HYSTERECTOMY    . DIAGNOSTIC MAMMOGRAM  12/29/2000  Mammogram, suspicious lesions -BX pending  . TONSILLECTOMY       reports that she has never smoked. She has never used smokeless tobacco. She reports that she uses drugs. She reports that she does not drink alcohol.  Allergies  Allergen Reactions  . Levaquin [Levofloxacin] Other (See Comments)    Reaction:  Altered mental status   . Statins Other (See Comments)    Severe myalgias    Family History  Problem Relation Age of Onset  . Heart attack Father        several heart attacks  . Cancer Father        bone  . Arthritis Father   . Suicidality Mother   .  Depression Mother   . Heart attack Brother   . Heart disease Brother   . Pneumonia Brother   . Suicidality Son   . Hypertension Daughter   . Depression Other        Most family members    Prior to Admission medications   Medication Sig Start Date End Date Taking? Authorizing Provider  acetaminophen (TYLENOL) 500 MG tablet Take 1,000 mg by mouth every 6 (six) hours as needed for moderate pain.   Yes [provider]  Cholecalciferol (VITAMIN D-3) 1000 units CAPS Take 1,000 Units by mouth daily.    Yes [provider]  Cranberry (SM CRANBERRY) 300 MG tablet Take 300 mg by mouth 3 (three) times daily.    Yes [provider]  cyanocobalamin 500 MCG tablet Take 500 mcg by mouth daily.    Yes [provider]  docusate sodium (COLACE) 100 MG capsule Take 200 mg by mouth daily.   Yes [provider]  HYDROcodone-acetaminophen (NORCO/VICODIN) 5-325 MG tablet Take 1 tablet by mouth every 6 (six) hours as needed. 01/21/17  Yes Drenda Freeze, MD  QUEtiapine (SEROQUEL) 25 MG tablet Take 1 tablet (25 mg total) by mouth every evening. 6pm 12/06/16  Yes Briscoe Deutscher, DO  nystatin-triamcinolone ointment (MYCOLOG) Apply 1 application topically 2 (two) times daily. Patient not taking: Reported on 02/17/2017 12/06/16   Briscoe Deutscher, DO  polyethylene glycol (MIRALAX / GLYCOLAX) packet Take 17 g by mouth daily. Patient not taking: Reported on 02/17/2017 01/23/17   Shon Millet, DO    Physical Exam: Vitals:   02/17/17 1200 02/17/17 1230 02/17/17 1256 02/17/17 1300  BP: (!) 117/49 (!) 117/49  (!) 109/44  Pulse: 71 70  71  Resp: 16 16  20   Temp:   99.6 F (37.6 C)   TempSrc:   Rectal   SpO2: 97% 94%  94%  Weight:      Height:          Constitutional: NAD, calm, comfortable, demented Eyes: PERRL, lids and conjunctivae normal ENMT: Mucous membranes are moist. Posterior pharynx clear of any exudate or lesions. Neck: normal,  supple Respiratory: clear to auscultation bilaterally, no wheezing, no crackles. Normal respiratory effort. No accessory muscle use.  Cardiovascular: Regular rate and rhythm, no murmurs / rubs / gallops. No extremity edema. 2+ pedal pulses.  Abdomen: no tenderness, no masses palpated. Bowel sounds positive.  Musculoskeletal: no clubbing / cyanosis. Normal muscle tone.  Skin: no rashes, lesions, ulcers. No induration Neurologic: CN 2-12 grossly intact. Strength 5/5 in all 4.  Psychiatric: Normal judgment and insight. Alert and oriented x to self only.   Labs on Admission: I have personally reviewed following labs and imaging studies  CBC:  Recent Labs Lab 02/17/17 1137  WBC 11.7*  NEUTROABS 10.3*  HGB 14.4  HCT 41.8  MCV 91.5  PLT 678*   Basic Metabolic Panel:  Recent Labs Lab 02/17/17 1137  NA 137  K 3.6  CL 105  CO2 21*  GLUCOSE 182*  BUN 10  CREATININE 0.79  CALCIUM 8.9   GFR: Estimated Creatinine Clearance: 52.2 mL/min (by C-G formula based on SCr of 0.79 mg/dL). Liver Function Tests:  Recent Labs Lab 02/17/17 1137  AST 31  ALT 21  ALKPHOS 63  BILITOT 0.7  PROT 6.4*  ALBUMIN 3.7   No results for input(s): LIPASE, AMYLASE in the last 168 hours. No results for input(s): AMMONIA in the last 168 hours. Coagulation Profile:  Recent Labs Lab 02/17/17 1137  INR 1.01   Cardiac Enzymes: No results for input(s): CKTOTAL, CKMB, CKMBINDEX, TROPONINI in the last 168 hours. BNP (last 3 results) No results for input(s): PROBNP in the last 8760 hours. HbA1C: No results for input(s): HGBA1C in the last 72 hours. CBG: No results for input(s): GLUCAP in the last 168 hours. Lipid Profile: No results for input(s): CHOL, HDL, LDLCALC, TRIG, CHOLHDL, LDLDIRECT in the last 72 hours. Thyroid Function Tests: No results for input(s): TSH, T4TOTAL, FREET4, T3FREE, THYROIDAB in the last 72 hours. Anemia Panel: No results for input(s): VITAMINB12, FOLATE, FERRITIN, TIBC,  IRON, RETICCTPCT in the last 72 hours. Urine analysis:    Component Value Date/Time   COLORURINE YELLOW 02/17/2017 1331   APPEARANCEUR CLEAR 02/17/2017 1331   LABSPEC 1.015 02/17/2017 1331   PHURINE 7.5 02/17/2017 1331   GLUCOSEU NEGATIVE 02/17/2017 1331   GLUCOSEU NEGATIVE 12/09/2016 1104   HGBUR NEGATIVE 02/17/2017 1331   BILIRUBINUR NEGATIVE 02/17/2017 1331   BILIRUBINUR Negative 12/09/2016 1048   KETONESUR TRACE (A) 02/17/2017 1331   PROTEINUR NEGATIVE 02/17/2017 1331   UROBILINOGEN 0.2 12/09/2016 1104   NITRITE NEGATIVE 02/17/2017 1331   LEUKOCYTESUR NEGATIVE 02/17/2017 1331     Radiological Exams on Admission: Dg Chest 2 View  Result Date: 02/17/2017 CLINICAL DATA:  Fever, cough and altered mental status. EXAM: CHEST  2 VIEW COMPARISON:  01/21/2017 FINDINGS: Cardiomediastinal silhouette is normal. Mediastinal contours appear intact. There is no evidence of focal airspace consolidation, pleural effusion or pneumothorax. Osseous structures are without acute abnormality. Soft tissues are grossly normal. IMPRESSION: No active cardiopulmonary disease. Electronically Signed   By: Fidela Salisbury M.D.   On: 02/17/2017 11:16    EKG: Independently reviewed.  Sinus rhythm  Assessment/Plan Active Problems:   Diastolic dysfunction   Coronary artery disease   HCAP (healthcare-associated pneumonia)   Dementia   Abdominal mass   Concern for healthcare associated pneumonia -will admit patient to the hospital with concerns for HCAP given URI symptoms and reported fever at home -Started on cefepime in the emergency room, continue -Consider repeating chest x-ray in 1-2 days after hydration to see if pneumonia would be more apparent at that time  Abdominal mass -No daughter has decided to pursue further workup, she is to establish care with Dr. Marin Olp who will see patient in consultation  Dementia -Worsening in the setting of #1, continue her home Seroquel  Chronic diastolic  CHF -She appears euvolemic, monitor volume status in the setting of IV fluids  Coronary artery disease -No chest pain   DVT prophylaxis: Lovenox  Code Status: DNR  Family Communication: daughter bedside Disposition Plan: admit to [place], home when ready Consults called: none, tagged Dr. Marin Olp     Admission status: Observation   At the point of initial evaluation,  it is my clinical opinion that admission for OBSERVATION is reasonable and necessary because the patient's presenting complaints in the context of their chronic conditions represent sufficient risk of deterioration or significant morbidity to constitute reasonable grounds for close observation in the hospital setting, but that the patient may be medically stable for discharge from the hospital within 24 to 48 hours.     Marzetta Board, MD [facility] Pager [phone]  If 7PM-7AM, please contact night-coverage [email or web address] Password [email or web address]  02/17/2017, 2:40 PM

## 2017-02-17 NOTE — Progress Notes (Signed)
Called for report. 

## 2017-02-18 ENCOUNTER — Ambulatory Visit: Payer: Medicare Other | Admitting: Hematology & Oncology

## 2017-02-18 ENCOUNTER — Other Ambulatory Visit: Payer: Self-pay

## 2017-02-18 ENCOUNTER — Other Ambulatory Visit: Payer: Medicare Other

## 2017-02-18 ENCOUNTER — Inpatient Hospital Stay (HOSPITAL_COMMUNITY): Payer: Medicare Other

## 2017-02-18 DIAGNOSIS — R109 Unspecified abdominal pain: Secondary | ICD-10-CM | POA: Diagnosis not present

## 2017-02-18 DIAGNOSIS — I2510 Atherosclerotic heart disease of native coronary artery without angina pectoris: Secondary | ICD-10-CM | POA: Diagnosis present

## 2017-02-18 DIAGNOSIS — Z809 Family history of malignant neoplasm, unspecified: Secondary | ICD-10-CM | POA: Diagnosis not present

## 2017-02-18 DIAGNOSIS — Z8261 Family history of arthritis: Secondary | ICD-10-CM | POA: Diagnosis not present

## 2017-02-18 DIAGNOSIS — I951 Orthostatic hypotension: Secondary | ICD-10-CM | POA: Diagnosis present

## 2017-02-18 DIAGNOSIS — R1900 Intra-abdominal and pelvic swelling, mass and lump, unspecified site: Secondary | ICD-10-CM | POA: Diagnosis present

## 2017-02-18 DIAGNOSIS — Z66 Do not resuscitate: Secondary | ICD-10-CM | POA: Diagnosis present

## 2017-02-18 DIAGNOSIS — R05 Cough: Secondary | ICD-10-CM | POA: Diagnosis not present

## 2017-02-18 DIAGNOSIS — I5032 Chronic diastolic (congestive) heart failure: Secondary | ICD-10-CM | POA: Diagnosis present

## 2017-02-18 DIAGNOSIS — G459 Transient cerebral ischemic attack, unspecified: Secondary | ICD-10-CM | POA: Diagnosis present

## 2017-02-18 DIAGNOSIS — R6510 Systemic inflammatory response syndrome (SIRS) of non-infectious origin without acute organ dysfunction: Secondary | ICD-10-CM

## 2017-02-18 DIAGNOSIS — F0280 Dementia in other diseases classified elsewhere without behavioral disturbance: Secondary | ICD-10-CM | POA: Diagnosis present

## 2017-02-18 DIAGNOSIS — R1909 Other intra-abdominal and pelvic swelling, mass and lump: Secondary | ICD-10-CM | POA: Diagnosis not present

## 2017-02-18 DIAGNOSIS — F0390 Unspecified dementia without behavioral disturbance: Secondary | ICD-10-CM | POA: Diagnosis not present

## 2017-02-18 DIAGNOSIS — N390 Urinary tract infection, site not specified: Secondary | ICD-10-CM | POA: Diagnosis not present

## 2017-02-18 DIAGNOSIS — J189 Pneumonia, unspecified organism: Secondary | ICD-10-CM | POA: Diagnosis not present

## 2017-02-18 DIAGNOSIS — Z8249 Family history of ischemic heart disease and other diseases of the circulatory system: Secondary | ICD-10-CM | POA: Diagnosis not present

## 2017-02-18 DIAGNOSIS — I110 Hypertensive heart disease with heart failure: Secondary | ICD-10-CM | POA: Diagnosis present

## 2017-02-18 DIAGNOSIS — Z888 Allergy status to other drugs, medicaments and biological substances status: Secondary | ICD-10-CM | POA: Diagnosis not present

## 2017-02-18 DIAGNOSIS — G309 Alzheimer's disease, unspecified: Secondary | ICD-10-CM | POA: Diagnosis present

## 2017-02-18 DIAGNOSIS — Z79899 Other long term (current) drug therapy: Secondary | ICD-10-CM | POA: Diagnosis not present

## 2017-02-18 DIAGNOSIS — E876 Hypokalemia: Secondary | ICD-10-CM | POA: Diagnosis present

## 2017-02-18 DIAGNOSIS — Z818 Family history of other mental and behavioral disorders: Secondary | ICD-10-CM | POA: Diagnosis not present

## 2017-02-18 DIAGNOSIS — Z90710 Acquired absence of both cervix and uterus: Secondary | ICD-10-CM | POA: Diagnosis not present

## 2017-02-18 DIAGNOSIS — Z9889 Other specified postprocedural states: Secondary | ICD-10-CM | POA: Diagnosis not present

## 2017-02-18 DIAGNOSIS — A419 Sepsis, unspecified organism: Secondary | ICD-10-CM | POA: Diagnosis not present

## 2017-02-18 LAB — COMPREHENSIVE METABOLIC PANEL
ALBUMIN: 3.1 g/dL — AB (ref 3.5–5.0)
ALK PHOS: 55 U/L (ref 38–126)
ALT: 17 U/L (ref 14–54)
ANION GAP: 6 (ref 5–15)
AST: 19 U/L (ref 15–41)
BUN: 11 mg/dL (ref 6–20)
CALCIUM: 8.3 mg/dL — AB (ref 8.9–10.3)
CO2: 23 mmol/L (ref 22–32)
Chloride: 110 mmol/L (ref 101–111)
Creatinine, Ser: 0.61 mg/dL (ref 0.44–1.00)
GFR calc Af Amer: >60 mL/min (ref >60)
GFR calc non Af Amer: >60 mL/min (ref >60)
GLUCOSE: 92 mg/dL (ref 65–99)
Potassium: 3.3 mmol/L — ABNORMAL LOW (ref 3.5–5.1)
SODIUM: 139 mmol/L (ref 135–145)
Total Bilirubin: 1 mg/dL (ref 0.3–1.2)
Total Protein: 5.6 g/dL — ABNORMAL LOW (ref 6.5–8.1)

## 2017-02-18 LAB — CEA: CEA1: 1.5 ng/mL (ref 0.0–4.7)

## 2017-02-18 LAB — CBC
HCT: 37.9 % (ref 36.0–46.0)
HEMOGLOBIN: 13.2 g/dL (ref 12.0–15.0)
MCH: 32 pg (ref 26.0–34.0)
MCHC: 34.8 g/dL (ref 30.0–36.0)
MCV: 91.8 fL (ref 78.0–100.0)
Platelets: 128 10*3/uL — ABNORMAL LOW (ref 150–400)
RBC: 4.13 MIL/uL (ref 3.87–5.11)
RDW: 13.4 % (ref 11.5–15.5)
WBC: 9.8 10*3/uL (ref 4.0–10.5)

## 2017-02-18 MED ORDER — POTASSIUM CHLORIDE 20 MEQ/15ML (10%) PO SOLN
40.0000 meq | Freq: Once | ORAL | Status: AC
  Administered 2017-02-18: 40 meq via ORAL
  Filled 2017-02-18: qty 30

## 2017-02-18 MED ORDER — GADOBENATE DIMEGLUMINE 529 MG/ML IV SOLN: 17.0000 mL | Freq: Once | INTRAVENOUS | Status: DC | PRN

## 2017-02-18 NOTE — Progress Notes (Signed)
PROGRESS NOTE    Valerie Chen  ZOX:[id number] DOB: 1929/01/19 DOA: 02/17/2017 PCP: Briscoe Deutscher, DO     Brief Narrative:  Valerie Chen is a 83 y.o. 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 Daughter has rescinded hospice, as she was told by the hospice RNs that "may be too early for hospice".  They state they talked to Dr Antonieta Pert office and he plans to work her up with several labs to figure out what the mass is about.   Assessment & Plan:   Active Problems:   Diastolic dysfunction   Coronary artery disease   HCAP (healthcare-associated pneumonia)   Dementia   Abdominal mass  SIRS without source -UA unremarkable -CXR negative, lungs sound clear this morning -Unclear source of fever. Now afebrile, WBC normal. Blood cultures pending. Repeat CXR  -Continue empiric cefepime for now   Abdominal mass -Per Dr. Marin Olp. Blood work and MR abdomen with contrast pending   Dementia -Supportive care. Continue seroquel   Chronic diastolic CHF -She appears euvolemic, monitor volume status in the setting of IV fluids  Coronary artery disease -No chest pain  Hypokalemia -Replace, trend    DVT prophylaxis: lovenox Code Status: DNR Family Communication: caretaker at bedside Disposition Plan: pending work up   Consultants:   Oncology  Procedures:   None   Antimicrobials:  Anti-infectives    Start     Dose/Rate Route Frequency Ordered Stop   02/17/17 2200  ceFEPIme (MAXIPIME) 1 g in dextrose 5 % 50 mL IVPB     1 g 100 mL/hr over 30 Minutes Intravenous Every 8 hours  02/17/17 1510     02/17/17 1200  ceFEPIme (MAXIPIME) 2 g in dextrose 5 % 50 mL IVPB     2 g 100 mL/hr over 30 Minutes Intravenous  Once 02/17/17 1146 02/17/17 1344   02/17/17 1200  vancomycin (VANCOCIN) IVPB 1000 mg/200 mL premix     1,000 mg 200 mL/hr over 60 Minutes Intravenous  Once 02/17/17 1146 02/17/17 1535       Subjective: No complaints, pleasantly confused   Objective: Vitals:   02/17/17 1300 02/17/17 1701 02/17/17 2137 02/18/17 0638  BP: (!) 109/44 (!) 91/44 134/63 (!) 127/49  Pulse: 71 61 67 64  Resp: 20 18 20 20   Temp:  97.8 F (36.6 C) 98.2 F (36.8 C) 97.7 F (36.5 C)  TempSrc:  Oral Oral Oral  SpO2: 94% 100% 100% 97%  Weight:  81.6 kg (180 lb)    Height:  5\' 4"  (1.626 m)      Intake/Output Summary (Last 24 hours) at 02/18/17 1210 Last data filed at 02/18/17 0951  Gross per 24 hour  Intake             1160 ml  Output                0 ml  Net             1160 ml   Filed Weights   02/17/17 1049 02/17/17 1701  Weight: 81.6 kg (180 lb) 81.6 kg (180 lb)    Examination:  General exam:  Appears calm and comfortable  Respiratory system: Clear to auscultation. Respiratory effort normal. Cardiovascular system: S1 & S2 heard, RRR. No JVD, murmurs, rubs, gallops or clicks. No pedal edema. Gastrointestinal system: Abdomen is nondistended, soft and nontender. No organomegaly or masses felt. Normal bowel sounds heard. Central nervous system: Alert. No focal neurological deficits. Extremities: Symmetric 5 x 5 power. Skin: No rashes, lesions or ulcers Psychiatry: +Dementia   Data Reviewed: I have personally reviewed following labs and imaging studies  CBC:  Recent Labs Lab 02/17/17 1137 02/18/17 0339  WBC 11.7* 9.8  NEUTROABS 10.3*  --   HGB 14.4 13.2  HCT 41.8 37.9  MCV 91.5 91.8  PLT 144* 299*   Basic Metabolic Panel:  Recent Labs Lab 02/17/17 1137 02/18/17 0339  NA 137 139  K 3.6 3.3*  CL 105 110  CO2 21* 23  GLUCOSE 182* 92  BUN 10 11    CREATININE 0.79 0.61  CALCIUM 8.9 8.3*   GFR: Estimated Creatinine Clearance: 52.2 mL/min (by C-G formula based on SCr of 0.61 mg/dL). Liver Function Tests:  Recent Labs Lab 02/17/17 1137 02/18/17 0339  AST 31 19  ALT 21 17  ALKPHOS 63 55  BILITOT 0.7 1.0  PROT 6.4* 5.6*  ALBUMIN 3.7 3.1*   No results for input(s): LIPASE, AMYLASE in the last 168 hours. No results for input(s): AMMONIA in the last 168 hours. Coagulation Profile:  Recent Labs Lab 02/17/17 1137  INR 1.01   Cardiac Enzymes: No results for input(s): CKTOTAL, CKMB, CKMBINDEX, TROPONINI in the last 168 hours. BNP (last 3 results) No results for input(s): PROBNP in the last 8760 hours. HbA1C: No results for input(s): HGBA1C in the last 72 hours. CBG: No results for input(s): GLUCAP in the last 168 hours. Lipid Profile: No results for input(s): CHOL, HDL, LDLCALC, TRIG, CHOLHDL, LDLDIRECT in the last 72 hours. Thyroid Function Tests: No results for input(s): TSH, T4TOTAL, FREET4, T3FREE, THYROIDAB in the last 72 hours. Anemia Panel: No results for input(s): VITAMINB12, FOLATE, FERRITIN, TIBC, IRON, RETICCTPCT in the last 72 hours. Sepsis Labs:  Recent Labs Lab 02/17/17 1144 02/17/17 1403  LATICACIDVEN 4.02* 0.99    No results found for this or any previous visit (from the past 240 hour(s)).     Radiology Studies: Dg Chest 2 View  Result Date: 02/17/2017 CLINICAL DATA:  Fever, cough and altered mental status. EXAM: CHEST  2 VIEW COMPARISON:  01/21/2017 FINDINGS: Cardiomediastinal silhouette is normal. Mediastinal contours appear intact. There is no evidence of focal airspace consolidation, pleural effusion or pneumothorax. Osseous structures are without acute abnormality. Soft tissues are grossly normal. IMPRESSION: No active cardiopulmonary disease. Electronically Signed   By: Fidela Salisbury M.D.   On: 02/17/2017 11:16      Scheduled Meds: . enoxaparin (LOVENOX) injection  40 mg  Subcutaneous Q24H  . QUEtiapine  25 mg Oral QPM   Continuous Infusions: . ceFEPime (MAXIPIME) IV Stopped (02/18/17 0546)     LOS: 0 days    Time spent: 40 minutes   Dessa Phi, DO [facility] [email or web address] Password [email or web address] 02/18/2017, 12:10 PM

## 2017-02-18 NOTE — Consult Note (Signed)
Referral MD  Reason for Referral: Right abdominal mesenteric mass   Chief Complaint  Patient presents with  . Altered Mental Status  . Cough  : Unable to give history  HPI: Valerie Chen is a very nice 83 year old white female. She is originally from [place]. She reports she has Alzheimer's disease. She is currently living with a daughter. She does have a caregiver.  Back a month ago, she had some abdominal pain. She had a CT of the abdomen. This showed a 6 x 4 cm mesenteric mass in the abdomen area and it was not obstructing any organs. There is no lymphadenopathy. There is no hepatomegaly.  Because of her dementia, her family has not wanted any aggressive intervention.  She now is admitted with what appears to be a urinary tract infection.  She cannot give any history. However, her daughter and caregiver who are with her, say that she is fairly functional. She does have some cognitive skills. She is eating. She's not lost weight. His been no obvious nausea or vomiting. She's had no obvious change in bowel or bladder habits.  Labwork done today shows a white cell count 9.8. Hemoglobin 13.2. Platelet count 128,000. Her calcium is 8.3. Potassium 3.3. Albumin 3.1.  Her LDH that was done was 150.  There is no bleeding. She's had no cough.  A chest x-ray done on admission was negative.  She has been walking. She does like to get out of the house.  It seems like she has had a fairly decent quality of life.     Past Medical History:  Diagnosis Date  . Alzheimer disease   . Arthritis   . CAD (coronary artery disease)    Probable CAD, nuclear February, 2012, possible anteroseptal and inferoseptal ischemia but the study was technically difficult  . Chest pain    Hospital February, 2012, nuclear, possible anteroseptal and inferoseptal ischemia, technically difficult, medical therapy  . Dementia    Significant  . Diastolic dysfunction    Echo, February, 2012  .  Diastolic dysfunction   . DNR (do not resuscitate)   . Dyslipidemia   . Edema    hospitalizations September, 2013, some fluid overload  . Ejection fraction    EF 55%, echo, February, 2012  //   EF 55-60%, echo, February 25, 2012 // Echo 12/17: EF 60-65, no RWMA, Gr 1 DD, normal RVSF  . Essential hypertension   . GI bleed    February, 2012 incomplete colonoscopy, hemorrhoids, needs complete colonoscopy  . Hx: UTI (urinary tract infection)   . Hyperlipidemia   . Leg pain, bilateral   . Major depression    2 overdoses in 1975 & 1977  . Migraines    25 year  . Orthostatic hypotension   . Suicide attempt ([place]) 1985   2 attempts with Valium  . TIA (transient ischemic attack)       ???question of a TIA in February, 2012, carotid Doppler, August 11, 2010 no significant abnormalities  :  Past Surgical History:  Procedure Laterality Date  . ABDOMINAL HYSTERECTOMY    . DIAGNOSTIC MAMMOGRAM  12/29/2000   Mammogram, suspicious lesions -BX pending  . TONSILLECTOMY    :   Current Facility-Administered Medications:  .  0.9 %  sodium chloride infusion, , Intravenous, Continuous, Gherghe, Vella Redhead, MD, Last Rate: 100 mL/hr at 02/18/17 0515 .  ceFEPIme (MAXIPIME) 1 g in dextrose 5 % 50 mL IVPB, 1 g, Intravenous, Q8H, [place], Baltazar Najjar, RPH, Stopped  at 02/18/17 0546 .  enoxaparin (LOVENOX) injection 40 mg, 40 mg, Subcutaneous, Q24H, Caren Griffins, MD, 40 mg at 02/17/17 2103 .  HYDROcodone-acetaminophen (NORCO/VICODIN) 5-325 MG per tablet 1 tablet, 1 tablet, Oral, Q6H PRN, Cruzita Lederer, Costin M, MD .  QUEtiapine (SEROQUEL) tablet 25 mg, 25 mg, Oral, QPM, Gherghe, Costin M, MD, 25 mg at 02/17/17 1736:  . enoxaparin (LOVENOX) injection  40 mg Subcutaneous Q24H  . QUEtiapine  25 mg Oral QPM  :  Allergies  Allergen Reactions  . Levaquin [Levofloxacin] Other (See Comments)    Reaction:  Altered mental status   . Statins Other (See Comments)    Severe myalgias  :  Family History   Problem Relation Age of Onset  . Heart attack Father        several heart attacks  . Cancer Father        bone  . Arthritis Father   . Suicidality Mother   . Depression Mother   . Heart attack Brother   . Heart disease Brother   . Pneumonia Brother   . Suicidality Son   . Hypertension Daughter   . Depression Other        Most family members  :  Social History   Social History  . Marital status: Widowed    Spouse name: N/A  . Number of children: 3  . Years of education: HS   Occupational History  . RETIRED    Social History Main Topics  . Smoking status: Never Smoker  . Smokeless tobacco: Never Used  . Alcohol use No  . Drug use: Yes     Comment: hx of valium in 1980's - had 2 overdoses per daughter  . Sexual activity: No   Other Topics Concern  . Not on file   Social History Narrative   Has 24/7 caregiver.      2 husbands   3 children      Diet: Heart Health   Caffeine- yes, tea   Married- widow   House- 1 stories, 2 persons   Pets- no, puppy died 1 1/2 years ago   Current/Past profession- Retired Engineer, manufacturing systems   Exercise- Yes, walking   Living Will-Yes   DNR- Yes   POA/HPOA- Yes   Right-handed.     :  Pertinent items are noted in HPI.  Exam: Patient Vitals for the past 24 hrs:  BP Temp Temp src Pulse Resp SpO2 Height Weight  02/18/17 0638 (!) 127/49 97.7 F (36.5 C) Oral 64 20 97 % - -  02/17/17 2137 134/63 98.2 F (36.8 C) Oral 67 20 100 % - -  02/17/17 1701 (!) 91/44 97.8 F (36.6 C) Oral 61 18 100 % 5\' 4"  (1.626 m) 180 lb (81.6 kg)  02/17/17 1300 (!) 109/44 - - 71 20 94 % - -  02/17/17 1256 - 99.6 F (37.6 C) Rectal - - - - -  02/17/17 1230 (!) 117/49 - - 70 16 94 % - -  02/17/17 1200 (!) 117/49 - - 71 16 97 % - -  02/17/17 1130 (!) 132/53 - - 73 (!) 26 97 % - -  02/17/17 1100 (!) 137/50 - - 73 20 97 % - -  02/17/17 1049 (!) 122/49 98.5 F (36.9 C) Oral 76 (!) 22 99 % 5\' 4"  (1.626 m) 180 lb (81.6 kg)    As above    Recent Labs   02/17/17 1137 02/18/17 0339  WBC 11.7* 9.8  HGB 14.4 13.2  HCT 41.8 37.9  PLT 144* 128*    Recent Labs  02/17/17 1137 02/18/17 0339  NA 137 139  K 3.6 3.3*  CL 105 110  CO2 21* 23  GLUCOSE 182* 92  BUN 10 11  CREATININE 0.79 0.61  CALCIUM 8.9 8.3*    Blood smear review:  None  Pathology: None     Assessment and Plan:  Valerie Chen is an 82 year old white female. She has a mesenteric mass. From my perspective, this certainly is not large.  I'm not even sure this would be malignant. It might be a desmoid tumor. I suppose it could be a carcinoid.  Of note, she has had a hysterectomy in the past. I will leave this was for malignancy.   I spoke to her family. One of her daughters was on a cell phone. They do not want any invasive procedures.  We will send off a chromogranin A level. We will send off a CEA level.  Possibly, an MRI of the abdomen might give [place] more detail.  Again, quality of life is the key.  I suppose that if any treatment were to be entertained, radiotherapy might be the way to go if this was needed.  I would be surprised if this mesenteric mass would be the determinate of her prognosis.  I spent about 45 minutes with her and her family. They're all very nice. They are all very reasonable.  I would like to think that she would be a a hospice candidate based on her Alzheimer's alone.  Lattie Haw, MD  Hebrews 12:12

## 2017-02-19 ENCOUNTER — Inpatient Hospital Stay (HOSPITAL_COMMUNITY): Payer: Medicare Other

## 2017-02-19 DIAGNOSIS — N390 Urinary tract infection, site not specified: Secondary | ICD-10-CM

## 2017-02-19 DIAGNOSIS — F0390 Unspecified dementia without behavioral disturbance: Secondary | ICD-10-CM

## 2017-02-19 DIAGNOSIS — A419 Sepsis, unspecified organism: Principal | ICD-10-CM

## 2017-02-19 LAB — BASIC METABOLIC PANEL
ANION GAP: 7 (ref 5–15)
BUN: 10 mg/dL (ref 6–20)
CO2: 23 mmol/L (ref 22–32)
Calcium: 8.7 mg/dL — ABNORMAL LOW (ref 8.9–10.3)
Chloride: 108 mmol/L (ref 101–111)
Creatinine, Ser: 0.61 mg/dL (ref 0.44–1.00)
GFR calc Af Amer: >60 mL/min (ref >60)
GLUCOSE: 111 mg/dL — AB (ref 65–99)
POTASSIUM: 3.4 mmol/L — AB (ref 3.5–5.1)
SODIUM: 138 mmol/L (ref 135–145)

## 2017-02-19 LAB — BETA 2 MICROGLOBULIN, SERUM: Beta-2 Microglobulin: 2 mg/L (ref 0.6–2.4)

## 2017-02-19 LAB — CBC
HCT: 36.6 % (ref 36.0–46.0)
Hemoglobin: 12.4 g/dL (ref 12.0–15.0)
MCH: 31.3 pg (ref 26.0–34.0)
MCHC: 33.9 g/dL (ref 30.0–36.0)
MCV: 92.4 fL (ref 78.0–100.0)
PLATELETS: 142 10*3/uL — AB (ref 150–400)
RBC: 3.96 MIL/uL (ref 3.87–5.11)
RDW: 13.5 % (ref 11.5–15.5)
WBC: 6.3 10*3/uL (ref 4.0–10.5)

## 2017-02-19 LAB — CEA: CEA: 1.3 ng/mL (ref 0.0–4.7)

## 2017-02-19 MED ORDER — POTASSIUM CHLORIDE 20 MEQ/15ML (10%) PO SOLN
40.0000 meq | Freq: Once | ORAL | Status: AC
  Administered 2017-02-19: 40 meq via ORAL
  Filled 2017-02-19: qty 30

## 2017-02-19 MED ORDER — AMPICILLIN 500 MG PO CAPS: 500.0000 mg | ORAL_CAPSULE | Freq: Four times a day (QID) | ORAL | 0 refills | Status: AC

## 2017-02-19 MED ORDER — GUAIFENESIN-DM 100-10 MG/5ML PO SYRP: 5.0000 mL | ORAL_SOLUTION | ORAL | 0 refills | Status: DC | PRN

## 2017-02-19 NOTE — Progress Notes (Signed)
Patient discharged to home, all discharge medications and instructions reviewed and questions answered.  Patient to be assisted to vehicle by wheelchair.  

## 2017-02-19 NOTE — Progress Notes (Signed)
Patient in and out cathed per MD order, urine specimen collected and sent to lab for culture.

## 2017-02-19 NOTE — Care Management Note (Signed)
Case Management Note  Patient Details  Name: Valerie Chen MRN: [id number] Date of Birth: 16-Oct-1928  Subjective/Objective:   SIRS without source, abdominal mass, Dementia, HCAP                 Action/Plan: Discharge Planning: Spoke to dtr, Santiago Bumpers and caregiver at bedside. Dtrs revoked Hospice at this time. [facility] is aware (please see note). Pt has 24 hour caregivers at home. Pt has hospital bed, transfer chair, shower chair, RW and bedside commode at home.   PCP Briscoe Deutscher MD  Expected Discharge Date:  02/19/17               Expected Discharge Plan:  Home/Self Care  In-House Referral:  NA  Discharge planning Services  CM Consult  Post Acute Care Choice:  NA Choice offered to:  NA  DME Arranged:  N/A DME Agency:  NA  HH Arranged:  NA HH Agency:  NA  Status of Service:  Completed, signed off  If discussed at [place] of Stay Meetings, dates discussed:    Additional Comments:  Erenest Rasher, RN 02/19/2017, 10:18 AM

## 2017-02-19 NOTE — Discharge Summary (Signed)
Physician Discharge Summary  Valerie Chen IWP:[id number] DOB: March 01, 1929 DOA: 02/17/2017  PCP: Briscoe Deutscher, DO  Admit date: 02/17/2017 Discharge date: 02/19/2017  Admitted From: home Disposition:  home  Recommendations for Outpatient Follow-up:  1. Follow up with PCP in 1 week 2. Follow up with Dr. Marin Olp 3. Please obtain BMP in 1 week to follow up hypokalemia  4. 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 Daughter has rescinded hospice, as she was told by the hospice RNs that "may be too early for hospice". They state they talked to Dr Antonieta Pert office and he plans to work her up with several labs to figure out what the mass is about. She was admitted for further work up. She underwent MRI abdomen as well as CEA, chromogranin A, beta 2 microgloblin levels. Dr. Marin Olp evaluated patient. He has recommended further follow up with nuclear medicine scan. There was also question of possible UTI vs HCAP. Xray has been negative for acute infiltrate. She had a cough which may be a viral URI. UA was also unremarkable. At family's insistence, urine culture was obtained, which is pending at this time. They tell me that her UA is always normal and culture returns positive. Last urine culture was positive for enterococcus, pansensitive. Will discharge with oral ampicillin and  PCP to follow up on urine culture results for identification and sensitivity.   Discharge Diagnoses:  Active Problems:   Diastolic dysfunction   Coronary artery disease   HCAP (healthcare-associated pneumonia)   Dementia   Abdominal mass  Sepsis, possibly UTI, POA  -UA unremarkable. Family tells me that her UA is always normal but culture returns positive. Last urine culture was positive for enterococcus, pansensitive. Will discharge with oral ampicillin and PCP to follow up on urine culture results for identification and sensitivity.   Abdominal mass -Per Dr. Marin Olp. Chromogranin A and beta 2 microclobulin pending. Further imaging as outpatient.   Dementia -Supportive care. Continue seroquel   Chronic diastolic CHF -She appears euvolemic, monitor volume status in the setting of IV fluids  Coronary artery disease -No chest pain  Hypokalemia -Replace, trend    Discharge Instructions  Discharge Instructions    Call MD for:  difficulty breathing, headache or visual disturbances    Complete by:  As directed    Call MD for:  extreme fatigue    Complete by:  As directed    Call MD for:  hives    Complete by:  As directed    Call MD for:  persistant dizziness or light-headedness    Complete by:  As directed    Call MD for:  persistant nausea and vomiting    Complete by:  As directed    Call MD for:  severe uncontrolled pain    Complete by:  As directed    Call MD for:  temperature >100.4    Complete by:  As directed  Diet general    Complete by:  As directed    Discharge instructions    Complete by:  As directed    You were cared for by a hospitalist during your hospital stay. If you have any questions about your discharge medications or the care you received while you were in the hospital after you are discharged, you can call the unit and asked to speak with the hospitalist on call if the hospitalist that took care of you is not available. Once you are discharged,  your primary care physician will handle any further medical issues. Please note that NO REFILLS for any discharge medications will be authorized once you are discharged, as it is imperative that you return to your primary care physician (or establish a relationship with a primary care physician if you do not have one) for your aftercare needs so that they can reassess your need for medications and monitor your lab values.   Increase activity slowly    Complete by:  As directed      Allergies as of 02/19/2017      Reactions   Levaquin [levofloxacin] Other (See Comments)   Reaction:  Altered mental status    Statins Other (See Comments)   Severe myalgias      Medication List    TAKE these medications   acetaminophen 500 MG tablet Commonly known as:  TYLENOL Take 1,000 mg by mouth every 6 (six) hours as needed for moderate pain.   ampicillin 500 MG capsule Commonly known as:  PRINCIPEN Take 1 capsule (500 mg total) by mouth 4 (four) times daily.   cyanocobalamin 500 MCG tablet Take 500 mcg by mouth daily.   docusate sodium 100 MG capsule Commonly known as:  COLACE Take 200 mg by mouth daily.   guaiFENesin-dextromethorphan 100-10 MG/5ML syrup Commonly known as:  ROBITUSSIN DM Take 5 mLs by mouth every 4 (four) hours as needed for cough.   HYDROcodone-acetaminophen 5-325 MG tablet Commonly known as:  NORCO/VICODIN Take 1 tablet by mouth every 6 (six) hours as needed.   nystatin-triamcinolone ointment Commonly known as:  MYCOLOG Apply 1 application topically 2 (two) times daily.   polyethylene glycol packet Commonly known as:  MIRALAX / GLYCOLAX Take 17 g by mouth daily.   QUEtiapine 25 MG tablet Commonly known as:  SEROQUEL Take 1 tablet (25 mg total) by mouth every evening. 6pm   SM CRANBERRY 300 MG tablet Generic drug:  Cranberry Take 300 mg by mouth 3 (three) times daily.   Vitamin D-3 1000 units Caps Take 1,000 Units by mouth daily.            Discharge  Care Instructions        Start     Ordered   02/19/17 0000  ampicillin (PRINCIPEN) 500 MG capsule  4 times daily     02/19/17 0910   02/19/17 0000  Increase activity slowly     02/19/17 0910   02/19/17 0000  Diet general     02/19/17 0910   02/19/17 0000  Discharge instructions    Comments:  You were cared for by a hospitalist during your hospital stay. If you have any questions about your discharge medications or the care you received while you were in the hospital after you are discharged, you can call the unit and asked to speak with the hospitalist on call if the hospitalist that took care of you is not available. Once you are discharged, your primary care physician will handle  any further medical issues. Please note that NO REFILLS for any discharge medications will be authorized once you are discharged, as it is imperative that you return to your primary care physician (or establish a relationship with a primary care physician if you do not have one) for your aftercare needs so that they can reassess your need for medications and monitor your lab values.   02/19/17 0910   02/19/17 0000  Call MD for:  temperature >100.4     02/19/17 0910   02/19/17 0000  Call MD for:  persistant nausea and vomiting     02/19/17 0910   02/19/17 0000  Call MD for:  severe uncontrolled pain     02/19/17 0910   02/19/17 0000  Call MD for:  extreme fatigue     02/19/17 0910   02/19/17 0000  Call MD for:  persistant dizziness or light-headedness     02/19/17 0910   02/19/17 0000  Call MD for:  difficulty breathing, headache or visual disturbances     02/19/17 0910   02/19/17 0000  Call MD for:  hives     02/19/17 0910   02/19/17 0000  guaiFENesin-dextromethorphan (ROBITUSSIN DM) 100-10 MG/5ML syrup  Every 4 hours PRN     02/19/17 0910     Follow-up Information    Briscoe Deutscher, DO. Schedule an appointment as soon as possible for a visit in 1 week(s).   Specialty:  Family Medicine Why:  Follow up on  final urine culture result obtained 9/1 Contact information: [place] [address] [phone]        Volanda Napoleon, MD. Call.   Specialty:  Oncology Contact information: [address] [place] [address] [phone]          Allergies  Allergen Reactions  . Levaquin [Levofloxacin] Other (See Comments)    Reaction:  Altered mental status   . Statins Other (See Comments)    Severe myalgias    Consultations:  Oncology    Procedures/Studies: Dg Chest 2 View  Result Date: 02/17/2017 CLINICAL DATA:  Fever, cough and altered mental status. EXAM: CHEST  2 VIEW COMPARISON:  01/21/2017 FINDINGS: Cardiomediastinal silhouette is normal. Mediastinal contours appear intact. There is no evidence of focal airspace consolidation, pleural effusion or pneumothorax. Osseous structures are without acute abnormality. Soft tissues are grossly normal. IMPRESSION: No active cardiopulmonary disease. Electronically Signed   By: Fidela Salisbury M.D.   On: 02/17/2017 11:16   Mr Abdomen W Wo Contrast  Result Date: 02/19/2017 CLINICAL DATA:  Inpatient. Left mesenteric mass identified on recent CT abdomen/pelvis study performed for abdominal pain. Dementia. EXAM: MRI ABDOMEN WITHOUT AND WITH CONTRAST TECHNIQUE: Multiplanar multisequence MR imaging of the abdomen was performed both before and after the administration of intravenous contrast. CONTRAST:  17 cc MultiHance IV. COMPARISON:  01/21/2017 CT abdomen/pelvis. FINDINGS: By report, the patient suffers from severe dementia. Despite the best attempts by the patient and technologist, examination is significantly degraded by motion artifact. No evidence of intravenous contrast opacification on the postcontrast images. Per the technologist, the gauze surrounding the patient's IV site was wet, with no evidence of IV site infiltration. Lower chest: Mild dependent right basilar atelectasis . Hepatobiliary: Normal liver  size and configuration. No hepatic steatosis. Subcentimeter T2 hyperintense segment 7 right liver lobe lesion near the IVC is stable since 07/31/2008 CT, compatible with a benign lesion, probably a simple liver cyst. No additional liver lesions. Normal gallbladder with no cholelithiasis. No biliary  ductal dilatation. Common bile duct diameter 5 mm. No choledocholithiasis. Pancreas: No pancreatic mass or duct dilation.  No pancreas divisum. Spleen: Normal size. No mass. Adrenals/Urinary Tract: Normal adrenals. No hydronephrosis. Tiny subcentimeter simple appearing renal cysts in the lower right and upper left kidneys, which otherwise appear normal with no suspicious renal masses. Stomach/Bowel: Grossly normal stomach. Visualized small and large bowel is normal caliber, with no bowel wall thickening. Re- demonstrated is a 6.1 x 4.2 x 5.1 cm solid left mesenteric mass (series 7/image 47) with mixed T2 signal intensity (series 7/image 47), previously 6.1 x 4.1 x 5.1 cm on 01/21/2017 CT, unchanged, and new since 07/31/2008 CT. This mass appears intimately associated with multiple left abdominal small bowel loops. Vascular/Lymphatic: Normal caliber abdominal aorta. Otherwise no pathologically enlarged abdominal lymph nodes. Other: No abdominal ascites or focal fluid collection. Musculoskeletal: No aggressive appearing focal osseous lesions. IMPRESSION: 1. Despite the best efforts of the patient and MRI staff, the examination is significantly limited by motion artifact and by unsuccessful IV contrast administration. 2. Solid left mesenteric 6.1 x 4.2 x 5.1 cm mass, intimately associated with multiple left abdominal small bowel loops, stable in size since 01/21/2017 CT, new since 07/31/2008 CT. Differential includes desmoid tumor, lymphoma or carcinoid. Small bowel adenocarcinoma is less likely. PET-CT may be useful for further characterization as clinically warranted. 3. Otherwise unremarkable MRI abdomen, with no evidence  of bowel obstruction. Electronically Signed   By: Ilona Sorrel M.D.   On: 02/19/2017 00:29   Ct Abdomen Pelvis W Contrast  Result Date: 01/21/2017 CLINICAL DATA:  Abdominal pain and flank pain, urinary tract symptoms, remote hysterectomy EXAM: CT ABDOMEN AND PELVIS WITH CONTRAST TECHNIQUE: Multidetector CT imaging of the abdomen and pelvis was performed using the standard protocol following bolus administration of intravenous contrast. CONTRAST:  168mL ISOVUE-300 IOPAMIDOL (ISOVUE-300) INJECTION 61% COMPARISON:  07/31/2008 FINDINGS: Lower chest: Minor bibasilar atelectasis. Normal heart size. No pericardial or pleural effusion. Degenerative changes of the lower thoracic spine. Hepatobiliary: Small punctate calcified granuloma in the right hepatic dome, image 19. Probable small hypodense 4 mm hepatic cyst, image 18. No other significant hepatic abnormality or biliary dilatation. Hepatic and portal veins are patent. Gallbladder biliary system unremarkable. Pancreas: Unremarkable. No pancreatic ductal dilatation or surrounding inflammatory changes. Spleen: Normal in size without focal abnormality. Adrenals/Urinary Tract: Adrenal glands are unremarkable. Kidneys are normal, without renal calculi, focal lesion, or hydronephrosis. Bladder is unremarkable. Stomach/Bowel: Negative for bowel obstruction, significant dilatation, ileus, or free air. Appendix not visualized. No acute inflammatory process. No fluid collection or abscess. In the left abdominal mesentery, there is a heterogeneous solid enhancing mass. This measures 6 x 4 x 5.1 cm. The mass appears centered in the mesenteric fat but does contact adjacent normal appearing small bowel without associated bowel obstruction, bowel wall thickening, or perforation. Differential considerations include carcinoid tumor, lymphoma, desmoid tumor, and less likely a metastatic process, as there is no additional mesenteric adenopathy or peritoneal disease. No ascites. This is  new compared 07/31/2008. Vascular/Lymphatic: Aortic atherosclerosis evident. No occlusive process or aneurysm. No dissection. Negative for retroperitoneal hemorrhage. Mesenteric and renal vasculature appear patent. No other adenopathy. Reproductive: Remote hysterectomy. No adnexal abnormality. No pelvic free fluid or fluid collection. Other: No abdominal wall hernia or abnormality. No abdominopelvic ascites. Musculoskeletal: Bones are osteopenic. Degenerative changes noted spine. No acute osseous finding or compression fracture. IMPRESSION: 6 x 4 x 5.1 cm left mid abdominal mesenteric mass as detailed above. This does contact small bowel but no  associated bowel wall thickening, perforation, or obstruction. Considerations again include carcinoid, lymphoma, desmoid, and less likely metastatic process. No other acute intra-abdominal or pelvic process. Electronically Signed   By: Jerilynn Mages.  Shick M.D.   On: 01/21/2017 12:38   Ct L-spine No Charge  Result Date: 01/21/2017 CLINICAL DATA:  83 year old female with continued back and flank pain following antibiotic treatment for urinary tract infection. EXAM: CT LUMBAR SPINE WITHOUT CONTRAST TECHNIQUE: Multidetector CT imaging of the lumbar spine was performed without intravenous contrast administration. Multiplanar CT image reconstructions were also generated. COMPARISON:  CT Abdomen and Pelvis today reported separately. Lumbar MRI 01/18/2016. FINDINGS: Segmentation: Normal lumbar segmentation which is the same numbering system used on the 2017 lumbar MRI. Alignment: Stable with relatively preserved lumbar lordosis. Stable grade 1 anterolisthesis of L4 on L5 and subtle anterolisthesis of L3 on L4. Stable subtle retrolisthesis of L2 on L3. Vertebrae: Osteopenia. No lumbar compression fracture. No acute osseous abnormality identified. Visible sacrum and SI joints intact. Paraspinal and other soft tissues: Reported on the CT Abdomen and Pelvis today separately. Negative visualized  posterior paraspinal soft tissues. Disc levels: T11-T12:  Negative. T12-L1:  Negative. L1-L2:  Negative. L2-L3: Mild disc space loss with circumferential disc bulge and endplate spurring. Moderate facet hypertrophy. No stenosis. L3-L4: Subtle anterolisthesis. Circumferential disc bulge. Moderate to severe facet hypertrophy greater on the right. Vacuum facet on the right. Stable mild right L3 foraminal stenosis. L4-L5: Grade 1 anterolisthesis appears stable. Circumferential disc bulge with broad-based posterior component. Severe facet hypertrophy. Vacuum facet on the right. Borderline to mild spinal stenosis appears stable. No foraminal stenosis. L5-S1: Chronic disc space loss. Circumferential but mostly right far lateral disc osteophyte complex. Mild facet hypertrophy. No stenosis. IMPRESSION: 1. No acute osseous abnormality and stable lumbar spine degeneration since the MRI on 01/18/2016. 2.  CT Abdomen and Pelvis today reported separately. Electronically Signed   By: Genevie Ann M.D.   On: 01/21/2017 12:26   Dg Chest Port 1 View  Result Date: 02/19/2017 CLINICAL DATA:  Pneumonia. EXAM: PORTABLE CHEST 1 VIEW COMPARISON:  Two-view chest x-ray 02/17/2017 FINDINGS: The heart size is normal. Lung volumes are low. There is slight increase in a diffuse interstitial pattern. Mild bibasilar airspace opacities are present without significant consolidation. The visualized soft tissues and bony thorax are unremarkable. IMPRESSION: 1. Low lung volumes. 2. Bibasilar airspace disease likely reflects atelectasis. Electronically Signed   By: San Morelle M.D.   On: 02/19/2017 07:31   Dg Chest Port 1 View  Result Date: 01/21/2017 CLINICAL DATA:  Chest pain.  Altered mental status. EXAM: PORTABLE CHEST 1 VIEW COMPARISON:  09/16/2015 FINDINGS: The heart size and mediastinal contours are within normal limits. Both lungs are clear. The visualized skeletal structures are unremarkable. IMPRESSION: No acute abnormality.  Aortic  atherosclerosis. Electronically Signed   By: Lorriane Shire M.D.   On: 01/21/2017 09:48       Discharge Exam: Vitals:   02/18/17 2119 02/19/17 0425  BP: 130/67 (!) 142/59  Pulse: 70 67  Resp: 18 16  Temp: 98.3 F (36.8 C) 98 F (36.7 C)  SpO2: 100% 96%   Vitals:   02/18/17 0638 02/18/17 1445 02/18/17 2119 02/19/17 0425  BP: (!) 127/49 (!) 117/44 130/67 (!) 142/59  Pulse: 64 66 70 67  Resp: 20 18 18 16   Temp: 97.7 F (36.5 C) 98.1 F (36.7 C) 98.3 F (36.8 C) 98 F (36.7 C)  TempSrc: Oral Oral Oral Oral  SpO2: 97% 100% 100% 96%  Weight:      Height:        General: Pt is alert, awake, not in acute distress Cardiovascular: RRR, S1/S2 +, no rubs, no gallops Respiratory: CTA bilaterally, no wheezing, no rhonchi Abdominal: Soft, NT, ND, bowel sounds + Extremities: no edema, no cyanosis CNS: pleasantly confused due to dementia     The results of significant diagnostics from this hospitalization (including imaging, microbiology, ancillary and laboratory) are listed below for reference.     Microbiology: Recent Results (from the past 240 hour(s))  Culture, blood (Routine x 2)     Status: None (Preliminary result)   Collection Time: 02/17/17 11:36 AM  Result Value Ref Range Status   Specimen Description BLOOD LEFT ANTECUBITAL  Final   Special Requests   Final    BOTTLES DRAWN AEROBIC AND ANAEROBIC Blood Culture adequate volume   Culture   Final    NO GROWTH 2 DAYS Performed at [facility], [address] [place], [place] [address]    Report Status PENDING  Incomplete  Culture, blood (Routine x 2)     Status: None (Preliminary result)   Collection Time: 02/17/17 12:17 PM  Result Value Ref Range Status   Specimen Description BLOOD LEFT HAND  Final   Special Requests   Final    BOTTLES DRAWN AEROBIC AND ANAEROBIC Blood Culture adequate volume   Culture   Final    NO GROWTH 2 DAYS Performed at [facility], [place] [address], [place], [place] [address]     Report Status PENDING  Incomplete     Labs: BNP (last 3 results) No results for input(s): BNP in the last 8760 hours. Basic Metabolic Panel:  Recent Labs Lab 02/17/17 1137 02/18/17 0339 02/19/17 0413  NA 137 139 138  K 3.6 3.3* 3.4*  CL 105 110 108  CO2 21* 23 23  GLUCOSE 182* 92 111*  BUN 10 11 10   CREATININE 0.79 0.61 0.61  CALCIUM 8.9 8.3* 8.7*   Liver Function Tests:  Recent Labs Lab 02/17/17 1137 02/18/17 0339  AST 31 19  ALT 21 17  ALKPHOS 63 55  BILITOT 0.7 1.0  PROT 6.4* 5.6*  ALBUMIN 3.7 3.1*   No results for input(s): LIPASE, AMYLASE in the last 168 hours. No results for input(s): AMMONIA in the last 168 hours. CBC:  Recent Labs Lab 02/17/17 1137 02/18/17 0339 02/19/17 0413  WBC 11.7* 9.8 6.3  NEUTROABS 10.3*  --   --   HGB 14.4 13.2 12.4  HCT 41.8 37.9 36.6  MCV 91.5 91.8 92.4  PLT 144* 128* 142*   Cardiac Enzymes: No results for input(s): CKTOTAL, CKMB, CKMBINDEX, TROPONINI in the last 168 hours. BNP: Invalid input(s): POCBNP CBG: No results for input(s): GLUCAP in the last 168 hours. D-Dimer No results for input(s): DDIMER in the last 72 hours. Hgb A1c No results for input(s): HGBA1C in the last 72 hours. Lipid Profile No results for input(s): CHOL, HDL, LDLCALC, TRIG, CHOLHDL, LDLDIRECT in the last 72 hours. Thyroid function studies No results for input(s): TSH, T4TOTAL, T3FREE, THYROIDAB in the last 72 hours.  Invalid input(s): FREET3 Anemia work up No results for input(s): VITAMINB12, FOLATE, FERRITIN, TIBC, IRON, RETICCTPCT in the last 72 hours. Urinalysis    Component Value Date/Time   COLORURINE YELLOW 02/17/2017 1331   APPEARANCEUR CLEAR 02/17/2017 1331   LABSPEC 1.015 02/17/2017 1331   PHURINE 7.5 02/17/2017 1331   GLUCOSEU NEGATIVE 02/17/2017 1331   GLUCOSEU NEGATIVE 12/09/2016 1104  HGBUR NEGATIVE 02/17/2017 1331   BILIRUBINUR NEGATIVE 02/17/2017 1331   BILIRUBINUR Negative 12/09/2016 1048   KETONESUR TRACE  (A) 02/17/2017 1331   PROTEINUR NEGATIVE 02/17/2017 1331   UROBILINOGEN 0.2 12/09/2016 1104   NITRITE NEGATIVE 02/17/2017 1331   LEUKOCYTESUR NEGATIVE 02/17/2017 1331   Sepsis Labs Invalid input(s): PROCALCITONIN,  WBC,  LACTICIDVEN Microbiology Recent Results (from the past 240 hour(s))  Culture, blood (Routine x 2)     Status: None (Preliminary result)   Collection Time: 02/17/17 11:36 AM  Result Value Ref Range Status   Specimen Description BLOOD LEFT ANTECUBITAL  Final   Special Requests   Final    BOTTLES DRAWN AEROBIC AND ANAEROBIC Blood Culture adequate volume   Culture   Final    NO GROWTH 2 DAYS Performed at [facility], [place] [address], [place], [place] [address]    Report Status PENDING  Incomplete  Culture, blood (Routine x 2)     Status: None (Preliminary result)   Collection Time: 02/17/17 12:17 PM  Result Value Ref Range Status   Specimen Description BLOOD LEFT HAND  Final   Special Requests   Final    BOTTLES DRAWN AEROBIC AND ANAEROBIC Blood Culture adequate volume   Culture   Final    NO GROWTH 2 DAYS Performed at [facility], [place] [address], [place], [place] [address]    Report Status PENDING  Incomplete     Time coordinating discharge: 40 minutes  SIGNED:  Dessa Phi, DO [facility] Pager [phone]  If 7PM-7AM, please contact night-coverage [email or web address] Password [email or web address] 02/19/2017, 12:00 PM

## 2017-02-19 NOTE — Progress Notes (Signed)
Mrs. Opperman had her MRI last night. On foresee, it really did not help [place] all that much. There is the mesenteric mass. There is no obstruction. No other tumors are noted.  She has a cough. Chest x-ray was done this morning.  Her CEA is normal.  I'm awaiting the chromogranin A level.  I would like to do an Octreoscan. Apparently, the radiology department does not do that anymore. I think there might be a newer scan that might be able to help [place]. Unfortunately, I don't think this would be done over the weekend.  I spoke to her daughter today. I gave her my opinion as to what might be going on. I just would be surprised if this tumor caused her problems. I know that it is amongst the loops of bowel. If this is carcinoid, I think that she will have this long-term and that it will not cause any issues. I think the only way we will know is to repeat a CT scan 3 months down the road.  Sounds like she may be discharged over the weekend. Her daughter is a little concerned about this cough.  On her physical exam, she is afebrile. Her blood pressure 142/59. Her lungs sound pretty clear bilaterally. She may have some occasional wheeze. She has good air movement. Cardiac exam regular rate and rhythm. Abdomen is soft. Bowel sounds are present. There is no guarding or rebound tenderness.  From my point of view, I think the next test would be a nuclear medicine scan. I think the new scan for carcinoid/neuroendocrine tumors is dotatate.  If she is discharged over the weekend, we can certainly follow her up as an outpatient.  Lattie Haw, MD

## 2017-02-20 LAB — URINE CULTURE: CULTURE: NO GROWTH

## 2017-02-22 ENCOUNTER — Telehealth: Payer: Self-pay | Admitting: *Deleted

## 2017-02-22 ENCOUNTER — Ambulatory Visit (HOSPITAL_BASED_OUTPATIENT_CLINIC_OR_DEPARTMENT_OTHER): Payer: Medicare Other | Admitting: Hematology & Oncology

## 2017-02-22 ENCOUNTER — Other Ambulatory Visit: Payer: Medicare Other

## 2017-02-22 ENCOUNTER — Other Ambulatory Visit: Payer: Self-pay

## 2017-02-22 DIAGNOSIS — R262 Difficulty in walking, not elsewhere classified: Secondary | ICD-10-CM

## 2017-02-22 DIAGNOSIS — R1900 Intra-abdominal and pelvic swelling, mass and lump, unspecified site: Secondary | ICD-10-CM

## 2017-02-22 DIAGNOSIS — R1907 Generalized intra-abdominal and pelvic swelling, mass and lump: Secondary | ICD-10-CM

## 2017-02-22 DIAGNOSIS — F0390 Unspecified dementia without behavioral disturbance: Secondary | ICD-10-CM | POA: Diagnosis not present

## 2017-02-22 LAB — CULTURE, BLOOD (ROUTINE X 2)
CULTURE: NO GROWTH
Culture: NO GROWTH
SPECIAL REQUESTS: ADEQUATE
SPECIAL REQUESTS: ADEQUATE

## 2017-02-22 LAB — CHROMOGRANIN A: Chromogranin A: 2 nmol/L (ref 0–5)

## 2017-02-22 NOTE — Progress Notes (Signed)
Hematology and Oncology Follow Up Visit  Valerie Chen [id number] [birth year] 83 y.o. 02/22/2017   Principle Diagnosis:   Left mesenteric mass  Current Therapy:    Observation     Interim History:  Valerie Chen is in for her first office visit. I saw her in the hospital a week ago. Her family has called [place] before. She has this mesenteric mass that was found on a CT scan. This was done because of some abdominal pain. The CT scan showed that there was a 6 x 4 x 5.1 cm left abdominal mesenteric mass. It has some contact with adjacent normal appearing bowel. No other abnormalities were noted. She has a normal CEA. She is a normal beta-2 microglobulin. Her LDH is normal.  She has bad dementia. Her family really does not wish for anything aggressive to be done for her. However, they would like to know what this masses.  She was recently hospitalized because of a urinary tract infection. She may also had a little bit of bronchitis.  There is no obvious change in bowel or bladder habits. She has no nausea or vomiting. She's had no fever. She's had no rashes. There's been no leg swelling. She does wear compression stockings.  She really cannot give any kind of history because of the dementia.  She sees be eating okay. She comes in with her caregiver. Caregiver is absolutely wonderful.  I would have to say that overall, her performance status is ECOG 2.  Medications:  Current Outpatient Prescriptions:  .  polyethylene glycol (MIRALAX / GLYCOLAX) packet, Take 17 g by mouth daily. (Patient taking differently: Take 17 g by mouth daily as needed. ), Disp: 14 each, Rfl: 0 .  acetaminophen (TYLENOL) 500 MG tablet, Take 1,000 mg by mouth every 6 (six) hours as needed for moderate pain., Disp: , Rfl:  .  ampicillin (PRINCIPEN) 500 MG capsule, Take 1 capsule (500 mg total) by mouth 4 (four) times daily., Disp: 28 capsule, Rfl: 0 .  Cholecalciferol (VITAMIN D-3) 1000 units CAPS, Take 1,000 Units by mouth  daily. , Disp: , Rfl:  .  Cranberry (SM CRANBERRY) 300 MG tablet, Take 300 mg by mouth 3 (three) times daily. , Disp: , Rfl:  .  cyanocobalamin 500 MCG tablet, Take 500 mcg by mouth daily. , Disp: , Rfl:  .  docusate sodium (COLACE) 100 MG capsule, Take 200 mg by mouth daily., Disp: , Rfl:  .  guaiFENesin-dextromethorphan (ROBITUSSIN DM) 100-10 MG/5ML syrup, Take 5 mLs by mouth every 4 (four) hours as needed for cough., Disp: 118 mL, Rfl: 0 .  HYDROcodone-acetaminophen (NORCO/VICODIN) 5-325 MG tablet, Take 1 tablet by mouth every 6 (six) hours as needed., Disp: 10 tablet, Rfl: 0 .  QUEtiapine (SEROQUEL) 25 MG tablet, Take 1 tablet (25 mg total) by mouth every evening. 6pm, Disp: 90 tablet, Rfl: 2  Allergies:  Allergies  Allergen Reactions  . Levaquin [Levofloxacin] Other (See Comments)    Reaction:  Altered mental status   . Statins Other (See Comments)    Severe myalgias    Past Medical History, Surgical history, Social history, and Family History were reviewed and updated.  Review of Systems: Review of Systems  Constitutional: Negative for appetite change, fatigue, fever and unexpected weight change.  HENT:   Negative for lump/mass, mouth sores, sore throat and trouble swallowing.   Respiratory: Negative for cough, hemoptysis and shortness of breath.   Cardiovascular: Negative for leg swelling and palpitations.  Gastrointestinal: Negative for abdominal distention,  abdominal pain, blood in stool, constipation, diarrhea, nausea and vomiting.  Genitourinary: Negative for bladder incontinence, dysuria, frequency and hematuria.   Musculoskeletal: Negative for arthralgias, back pain, gait problem and myalgias.  Skin: Negative for itching and rash.  Neurological: Negative for dizziness, extremity weakness, gait problem, headaches, numbness, seizures and speech difficulty.  Hematological: Does not bruise/bleed easily.  Psychiatric/Behavioral: Negative for depression and sleep disturbance.  The patient is not nervous/anxious.     Physical Exam:  weight is 183 lb 1.9 oz (83.1 kg). Her oral temperature is 97.9 F (36.6 C). Her blood pressure is 146/56 (abnormal) and her pulse is 86. Her respiration is 18 and oxygen saturation is 99%.   Wt Readings from Last 3 Encounters:  02/22/17 183 lb 1.9 oz (83.1 kg)  02/17/17 180 lb (81.6 kg)  01/31/17 180 lb 12.8 oz (82 kg)    Physical Exam  Constitutional: She is oriented to person, place, and time.  HENT:  Head: Normocephalic and atraumatic.  Mouth/Throat: Oropharynx is clear and moist.  Eyes: Pupils are equal, round, and reactive to light. EOM are normal.  Neck: Normal range of motion.  Cardiovascular: Normal rate, regular rhythm and normal heart sounds.   Pulmonary/Chest: Effort normal and breath sounds normal.  Abdominal: Soft. Bowel sounds are normal.  Musculoskeletal: Normal range of motion. She exhibits no edema, tenderness or deformity.  Lymphadenopathy:    She has no cervical adenopathy.  Neurological: She is alert and oriented to person, place, and time.  Skin: Skin is warm and dry. No rash noted. No erythema.  Psychiatric: She has a normal mood and affect. Her behavior is normal. Judgment and thought content normal.  Vitals reviewed.    Lab Results  Component Value Date   WBC 6.3 02/19/2017   HGB 12.4 02/19/2017   HCT 36.6 02/19/2017   MCV 92.4 02/19/2017   PLT 142 (L) 02/19/2017     Chemistry      Component Value Date/Time   NA 138 02/19/2017 0413   K 3.4 (L) 02/19/2017 0413   CL 108 02/19/2017 0413   CO2 23 02/19/2017 0413   BUN 10 02/19/2017 0413   CREATININE 0.61 02/19/2017 0413      Component Value Date/Time   CALCIUM 8.7 (L) 02/19/2017 0413   ALKPHOS 55 02/18/2017 0339   AST 19 02/18/2017 0339   ALT 17 02/18/2017 0339   BILITOT 1.0 02/18/2017 0339         Impression and Plan: Valerie Chen is a 83 year old white female. She has bad dementia. However, she does have some  functionality.  I think the best thing for [place] to do is to follow-up with a CT scan in 6 weeks. I think this will really tell us if this mesenteric mass is aggressive and not. I would not think that it is aggressive. It might be a carcinoid. I had ordered a: Chromogranin A in the hospital while she was there this past weekend. I do not have the result back yet.  I really do not want for her through any intensive x-ray studies. I do not want to put her through any kind of radioactive tracer scans as these take quite a while and she really cannot be left alone because of the dementia.  We really need to "see the big picture" and just be as simple as possible with our approach.  She is totally asymptomatic with this mass. I would not imagine that this mass whatever cause her problems.  The CT scan  in 6 weeks will really help [place] out.  I spent about 35 minutes with her. One of her daughters was on a cell phone and asked questions. I answered the questions. Mrs. Duchesneau really cannot ask any questions. She is very fortunate to have such a wonderful caregiver and family.     Volanda Napoleon, MD 9/4/20182:43 PM

## 2017-02-22 NOTE — Telephone Encounter (Addendum)
  Transition Care Management Follow-up Telephone Call   Date discharged? 02/19/2017   How have you been since you were released from the hospital? "Doing well, much better" per pts daughter Rolm Bookbinder   Do you understand why you were in the hospital? yes   Do you understand the discharge instructions? yes   Where were you discharged to? Home    Items Reviewed:  Medications reviewed: yes  Allergies reviewed: yes  Dietary changes reviewed: yes. Daughter states that pt has a great appetite.  Referrals reviewed: yes. Pts daughter would like a referal to home health for a transport chair, bedside commode, shower chair with back, hospital bed, overbed table and a wheelchair through [place] care. Pts daughter states that they already have this all in the home from Hospice but she needs a new referral since Hospice is no longer involved.    Functional Questionnaire:   Activities of Daily Living (ADLs):   She states they are independent in the following: Pt has around the clock caregiver States they require assistance with the following: ambulation, bathing and hygiene, feeding, continence, grooming, toileting and dressing   Any transportation issues/concerns?: no   Any patient concerns? No. Daughter states that the hospital requested she had a repeat urine sample at PCP office and would like urine culture results to ensure she is on proper antibiotic.    Confirmed importance and date/time of follow-up visits scheduled yes  Provider Appointment booked with Dr Juleen China 02/23/2017 at 11:30.  Confirmed with patient if condition begins to worsen call PCP or go to the ER.  Patient was given the office number and encouraged to call back with question or concerns.  : yes

## 2017-02-23 ENCOUNTER — Ambulatory Visit (INDEPENDENT_AMBULATORY_CARE_PROVIDER_SITE_OTHER): Payer: Medicare Other | Admitting: Family Medicine

## 2017-02-23 ENCOUNTER — Encounter: Payer: Self-pay | Admitting: Family Medicine

## 2017-02-23 VITALS — BP 132/78 | HR 77 | Temp 98.4°F | Ht 64.0 in | Wt 181.8 lb

## 2017-02-23 DIAGNOSIS — F0281 Dementia in other diseases classified elsewhere with behavioral disturbance: Secondary | ICD-10-CM | POA: Diagnosis not present

## 2017-02-23 DIAGNOSIS — G309 Alzheimer's disease, unspecified: Secondary | ICD-10-CM | POA: Diagnosis not present

## 2017-02-23 DIAGNOSIS — A419 Sepsis, unspecified organism: Secondary | ICD-10-CM | POA: Diagnosis not present

## 2017-02-23 DIAGNOSIS — R262 Difficulty in walking, not elsewhere classified: Secondary | ICD-10-CM

## 2017-02-23 DIAGNOSIS — R1900 Intra-abdominal and pelvic swelling, mass and lump, unspecified site: Secondary | ICD-10-CM

## 2017-02-23 NOTE — Patient Instructions (Signed)
STOP: Aspirin, Ampicillin DECREASE: Seroquel to 1/2 tab per dose HOLD: Potassium for now

## 2017-02-23 NOTE — Telephone Encounter (Signed)
Patient has appointment with Valerie Chen today.

## 2017-02-23 NOTE — Telephone Encounter (Signed)
Patient has appointment today with Dr. Juleen China.

## 2017-02-23 NOTE — Progress Notes (Signed)
Valerie Chen is a 83 y.o. female is here for transitional care hospital follow up visit:  History of Present Illness:   Water quality scientist, CMA, acting as scribe for Dr. Juleen China.  I have reviewed the intake provided by my medical assistant today. I have reviewed the prior telephone transitional care contact documented in the patient's medical record.  HPI:   Primary Problems Addressed During Hospital Visit 1. Chief reason for visit was: confusion, fever, URI symptoms. 2. Visit date was 02/17/17 - 02/19/17. 3. Medical records from the ER are available for review. 3. The patient DC with following primary diagnoses: sepsis, UTI versus HCAP.  Medications (current list documented below) 1. New medications prescribed were obtained by the patient (caregivers). Barrier for patient: dementia. 2. I have reviewed the medications with the patient's caregiver. The caregiver can confirm the names of the medications. 3. The patient is taking the correct medications.  Diagnostic Tests, Consultations, and Other Outstanding Issues 1. There were diagnostic tests still pending at the time of discharge. If applicable, these have been reviewed.  2. The patient does not need additional tests or consults arranged. 3. Other issues needing attention today: Home Health paperwork now that she is not pursuing Hospice at this time.  Patient Empowerment and Education 1. The patient's caregiver can provide a brief explanation of the reason for the hospital visit and the nature of the care provided. 2. The patient or caregiver can explain how self-care/care in the home should be provided. 3. The patient and/or caregiver know what signs or symptoms that would indicate deterioration of the patient's condition and what should be done to best avoid rehospitalization if this occurs? Yes 4. Additional educational needs were met today as follows: signs re: UTI.   Home Status 1. The patient has has not had a significant decline  in physical functioning since ER visit. The patient is not independent in performing ADL's. 2. The patient has not had a significant decline in cognitive functioning as a result of hospitalization. 3. Home Health services have not been set up for the patient. The patient is not receiving these services.   4. The patient is receiving care management services.   Communication 1. I have communicated with other members of the patient's care team such as the PCP, specialty physicians, care management, home health agency, or allied health providers about the patient today. 2. I have communicated to the patient's pharmacy about discontinued medication(s).  Additional Notes Additional concerns addressed today include: I reviewed Dr. Antonieta Pert notes re: this abdominal mesenteric mass, with plans to repeat a CT in 6 weeks.   PMHx, SurgHx, SocialHx, FamHx, Medications, and Allergies were reviewed in the Visit Navigator and updated as appropriate.   Patient Active Problem List   Diagnosis Date Noted  . Difficulty walking 02/23/2017  . Dementia 01/21/2017  . Abdominal mass 01/21/2017  . NSVT (nonsustained ventricular tachycardia) ([place]) 05/11/2016  . Coarse tremors 05/06/2016  . Syncope 12/29/2015  . Recurrent UTI 02/01/2015  . Essential hypertension 05/30/2013  . Edema   . Psychosis in elderly 03/12/2011  . Coronary artery disease   . Migraine headache   . Diastolic dysfunction   . TIA (transient ischemic attack)   . GI bleed   . Dyslipidemia   . Pain in joints 05/22/2009  . Vitamin D deficiency 01/27/2009  . Anemia 11/21/2008   Social History  Substance Use Topics  . Smoking status: Never Smoker  . Smokeless tobacco: Never Used  . Alcohol use No  Current Medications and Allergies:   Current Outpatient Prescriptions:  .  acetaminophen (TYLENOL) 500 MG tablet, Take 1,000 mg by mouth every 6 (six) hours as needed for moderate pain., Disp: , Rfl:  .  ampicillin (PRINCIPEN) 500 MG  capsule, Take 1 capsule (500 mg total) by mouth 4 (four) times daily., Disp: 28 capsule, Rfl: 0 .  Cholecalciferol (VITAMIN D-3) 1000 units CAPS, Take 1,000 Units by mouth daily. , Disp: , Rfl:  .  Cranberry (SM CRANBERRY) 300 MG tablet, Take 300 mg by mouth 3 (three) times daily. , Disp: , Rfl:  .  cyanocobalamin 500 MCG tablet, Take 500 mcg by mouth daily. , Disp: , Rfl:  .  docusate sodium (COLACE) 100 MG capsule, Take 200 mg by mouth daily., Disp: , Rfl:  .  guaiFENesin-dextromethorphan (ROBITUSSIN DM) 100-10 MG/5ML syrup, Take 5 mLs by mouth every 4 (four) hours as needed for cough., Disp: 118 mL, Rfl: 0 .  QUEtiapine (SEROQUEL) 25 MG tablet, Take 1 tablet (25 mg total) by mouth every evening. 6pm, Disp: 90 tablet, Rfl: 2 .  HYDROcodone-acetaminophen (NORCO/VICODIN) 5-325 MG tablet, Take 1 tablet by mouth every 6 (six) hours as needed. (Patient not taking: Reported on 02/23/2017), Disp: 10 tablet, Rfl: 0 .  polyethylene glycol (MIRALAX / GLYCOLAX) packet, Take 17 g by mouth daily. (Patient not taking: Reported on 02/23/2017), Disp: 14 each, Rfl: 0  Allergies  Allergen Reactions  . Levaquin [Levofloxacin] Other (See Comments)    Reaction:  Altered mental status   . Statins Other (See Comments)    Severe myalgias   Review of Systems   Pertinent items are noted in the HPI. Otherwise, ROS is negative.  Vitals:   Vitals:   02/23/17 1133  BP: 132/78  Pulse: 77  Temp: 98.4 F (36.9 C)  TempSrc: Oral  SpO2: 96%  Weight: 181 lb 12.8 oz (82.5 kg)  Height: '5\' 4"'  (1.626 m)     Body mass index is 31.21 kg/m.   Physical Exam:   Physical Exam  Constitutional: She appears well-nourished.  Pleasant, demented.   HENT:  Head: Normocephalic and atraumatic.  Eyes: Pupils are equal, round, and reactive to light. EOM are normal.  Neck: Normal range of motion. Neck supple.  Cardiovascular: Normal rate, regular rhythm, normal heart sounds and intact distal pulses.   Pulmonary/Chest: Effort  normal.  Abdominal: Soft.  Skin: Skin is warm.  Psychiatric: She has a normal mood and affect. Her behavior is normal.  Nursing note and vitals reviewed.   Reviewed HOSPITAL LABS: Results for orders placed or performed during the hospital encounter of 02/17/17  Culture, blood (Routine x 2)  Result Value Ref Range   Specimen Description BLOOD LEFT ANTECUBITAL    Special Requests      BOTTLES DRAWN AEROBIC AND ANAEROBIC Blood Culture adequate volume   Culture      NO GROWTH 5 DAYS Performed at [facility], [place] [address] [place], [place] [address]    Report Status 02/22/2017 FINAL   Culture, blood (Routine x 2)  Result Value Ref Range   Specimen Description BLOOD LEFT HAND    Special Requests      BOTTLES DRAWN AEROBIC AND ANAEROBIC Blood Culture adequate volume   Culture      NO GROWTH 5 DAYS Performed at [facility], [place] [address] [place], [place] [address]    Report Status 02/22/2017 FINAL   Culture, Urine  Result Value Ref Range   Specimen Description  URINE, CATHETERIZED    Special Requests NONE    Culture      NO GROWTH Performed at [facility], [place] [address] [place], [place] [address]    Report Status 02/20/2017 FINAL   Comprehensive metabolic panel  Result Value Ref Range   Sodium 137 135 - 145 mmol/L   Potassium 3.6 3.5 - 5.1 mmol/L   Chloride 105 101 - 111 mmol/L   CO2 21 (L) 22 - 32 mmol/L   Glucose, Bld 182 (H) 65 - 99 mg/dL   BUN 10 6 - 20 mg/dL   Creatinine, Ser 0.79 0.44 - 1.00 mg/dL   Calcium 8.9 8.9 - 10.3 mg/dL   Total Protein 6.4 (L) 6.5 - 8.1 g/dL   Albumin 3.7 3.5 - 5.0 g/dL   AST 31 15 - 41 U/L   ALT 21 14 - 54 U/L   Alkaline Phosphatase 63 38 - 126 U/L   Total Bilirubin 0.7 0.3 - 1.2 mg/dL   GFR calc non Af Amer >60 >60 mL/min   GFR calc Af Amer >60 >60 mL/min   Anion gap 11 5 - 15  CBC with Differential  Result Value Ref Range   WBC 11.7 (H) 4.0 - 10.5 K/uL   RBC 4.57 3.87 - 5.11 MIL/uL   Hemoglobin 14.4 12.0 -  15.0 g/dL   HCT 41.8 36.0 - 46.0 %   MCV 91.5 78.0 - 100.0 fL   MCH 31.5 26.0 - 34.0 pg   MCHC 34.4 30.0 - 36.0 g/dL   RDW 13.1 11.5 - 15.5 %   Platelets 144 (L) 150 - 400 K/uL   Neutrophils Relative % 88 %   Neutro Abs 10.3 (H) 1.7 - 7.7 K/uL   Lymphocytes Relative 5 %   Lymphs Abs 0.6 (L) 0.7 - 4.0 K/uL   Monocytes Relative 7 %   Monocytes Absolute 0.8 0.1 - 1.0 K/uL   Eosinophils Relative 0 %   Eosinophils Absolute 0.0 0.0 - 0.7 K/uL   Basophils Relative 0 %   Basophils Absolute 0.0 0.0 - 0.1 K/uL  Protime-INR  Result Value Ref Range   Prothrombin Time 13.2 11.4 - 15.2 seconds   INR 1.01   Urinalysis, Routine w reflex microscopic  Result Value Ref Range   Color, Urine YELLOW YELLOW   APPearance CLEAR CLEAR   Specific Gravity, Urine 1.015 1.005 - 1.030   pH 7.5 5.0 - 8.0   Glucose, UA NEGATIVE NEGATIVE mg/dL   Hgb urine dipstick NEGATIVE NEGATIVE   Bilirubin Urine NEGATIVE NEGATIVE   Ketones, ur TRACE (A) NEGATIVE mg/dL   Protein, ur NEGATIVE NEGATIVE mg/dL   Nitrite NEGATIVE NEGATIVE   Leukocytes, UA NEGATIVE NEGATIVE  Comprehensive metabolic panel  Result Value Ref Range   Sodium 139 135 - 145 mmol/L   Potassium 3.3 (L) 3.5 - 5.1 mmol/L   Chloride 110 101 - 111 mmol/L   CO2 23 22 - 32 mmol/L   Glucose, Bld 92 65 - 99 mg/dL   BUN 11 6 - 20 mg/dL   Creatinine, Ser 0.61 0.44 - 1.00 mg/dL   Calcium 8.3 (L) 8.9 - 10.3 mg/dL   Total Protein 5.6 (L) 6.5 - 8.1 g/dL   Albumin 3.1 (L) 3.5 - 5.0 g/dL   AST 19 15 - 41 U/L   ALT 17 14 - 54 U/L   Alkaline Phosphatase 55 38 - 126 U/L   Total Bilirubin 1.0 0.3 - 1.2 mg/dL   GFR calc non Af Amer >  60 >60 mL/min   GFR calc Af Amer >60 >60 mL/min   Anion gap 6 5 - 15  CBC  Result Value Ref Range   WBC 9.8 4.0 - 10.5 K/uL   RBC 4.13 3.87 - 5.11 MIL/uL   Hemoglobin 13.2 12.0 - 15.0 g/dL   HCT 37.9 36.0 - 46.0 %   MCV 91.8 78.0 - 100.0 fL   MCH 32.0 26.0 - 34.0 pg   MCHC 34.8 30.0 - 36.0 g/dL   RDW 13.4 11.5 - 15.5 %    Platelets 128 (L) 150 - 400 K/uL  Lactate dehydrogenase  Result Value Ref Range   LDH 150 98 - 192 U/L  Save smear  Result Value Ref Range   Smear Review SMEAR STAINED AND AVAILABLE FOR REVIEW   CEA  Result Value Ref Range   CEA 1.5 0.0 - 4.7 ng/mL  Chromogranin A  Result Value Ref Range   Chromogranin A 2 0 - 5 nmol/L  CEA  Result Value Ref Range   CEA 1.3 0.0 - 4.7 ng/mL  Beta 2 microglobulin, serum  Result Value Ref Range   Beta-2 Microglobulin 2.0 0.6 - 2.4 mg/L  CBC  Result Value Ref Range   WBC 6.3 4.0 - 10.5 K/uL   RBC 3.96 3.87 - 5.11 MIL/uL   Hemoglobin 12.4 12.0 - 15.0 g/dL   HCT 36.6 36.0 - 46.0 %   MCV 92.4 78.0 - 100.0 fL   MCH 31.3 26.0 - 34.0 pg   MCHC 33.9 30.0 - 36.0 g/dL   RDW 13.5 11.5 - 15.5 %   Platelets 142 (L) 150 - 400 K/uL  Basic metabolic panel  Result Value Ref Range   Sodium 138 135 - 145 mmol/L   Potassium 3.4 (L) 3.5 - 5.1 mmol/L   Chloride 108 101 - 111 mmol/L   CO2 23 22 - 32 mmol/L   Glucose, Bld 111 (H) 65 - 99 mg/dL   BUN 10 6 - 20 mg/dL   Creatinine, Ser 0.61 0.44 - 1.00 mg/dL   Calcium 8.7 (L) 8.9 - 10.3 mg/dL   GFR calc non Af Amer >60 >60 mL/min   GFR calc Af Amer >60 >60 mL/min   Anion gap 7 5 - 15  I-Stat CG4 Lactic Acid, ED  Result Value Ref Range   Lactic Acid, Venous 4.02 (HH) 0.5 - 1.9 mmol/L   Comment NOTIFIED PHYSICIAN   I-Stat CG4 Lactic Acid, ED  Result Value Ref Range   Lactic Acid, Venous 0.99 0.5 - 1.9 mmol/L   Assessment and Plan:   Valerie Chen was seen today for follow-up.  Diagnoses and all orders for this visit:  Sepsis, due to unspecified organism [facility]) Comments: Cultures negative. Patient back to baseline.  Orders: -     DME Hospital bed -     Shower chair -     For home use only DME Overbed table -     DME Wheelchair manual  Abdominal mass, unspecified abdominal location Comments: Now followed by Dr. Marin Olp. The patient continues to have realistic expectations re: her life expectancy. They want  to maximize her comfort. Orders: -     DME Hospital bed -     Shower chair -     For home use only DME Overbed table -     DME Wheelchair manual  Alzheimer's dementia with behavioral disturbance, unspecified timing of dementia onset Comments: At baseline. Her caregiver notes that the patient seems to be too sedated about  30 minutes after taking Seroquel. Okay to decrease to 1/2 tab.  Orders: -     DME Hospital bed -     Shower chair -     For home use only DME Overbed table -     DME Wheelchair manual  Difficulty walking Comments: Home Health needs addressed today. Orders: -     DME Hospital bed -     Shower chair -     For home use only DME Overbed table -     DME Wheelchair manual   . Reviewed expectations re: course of current medical issues. . Discussed self-management of symptoms. . Outlined signs and symptoms indicating need for more acute intervention. . Patient verbalized understanding and all questions were answered. Marland Kitchen Health Maintenance issues including appropriate healthy diet, exercise, and smoking avoidance were discussed with patient. . See orders for this visit as documented in the electronic medical record. . Patient received an After Visit Summary.  CMA served as Education administrator during this visit. History, Physical, and Plan performed by medical provider. The above documentation has been reviewed and is accurate and complete. Briscoe Deutscher, D.O.  Briscoe Deutscher, DO [place], [facility] [place] [facility] 02/26/2017  Future Appointments Date Time Provider [place]  03/16/2017 10:30 AM Camelia Phenes, DPM [facility]  04/04/2017 10:30 AM [facility]  04/04/2017 11:15 AM [facility] None  04/04/2017 11:30 AM [place], Rudell Cobb, MD [facility] None  05/23/2017 11:00 AM Briscoe Deutscher, DO [facility] None

## 2017-03-01 ENCOUNTER — Ambulatory Visit: Payer: Medicare Other | Admitting: Family Medicine

## 2017-03-01 NOTE — Addendum Note (Signed)
Addended by: Briscoe Deutscher R on: 03/01/2017 11:26 AM   Modules accepted: Level of Service

## 2017-03-03 ENCOUNTER — Ambulatory Visit: Payer: Medicare Other | Admitting: Family Medicine

## 2017-03-16 ENCOUNTER — Ambulatory Visit: Payer: Medicare Other | Admitting: Podiatry

## 2017-03-23 MED ORDER — GADOBENATE DIMEGLUMINE 529 MG/ML IV SOLN
20.0000 mL | Freq: Once | INTRAVENOUS | Status: AC | PRN
  Administered 2017-03-23: 17 mL via INTRAVENOUS

## 2017-03-27 ENCOUNTER — Encounter (HOSPITAL_COMMUNITY): Payer: Self-pay

## 2017-03-27 ENCOUNTER — Emergency Department (HOSPITAL_COMMUNITY): Payer: Medicare Other

## 2017-03-27 ENCOUNTER — Emergency Department (HOSPITAL_COMMUNITY)
Admission: EM | Admit: 2017-03-27 | Discharge: 2017-03-27 | Disposition: A | Discharge status: Home / Self Care | Payer: Medicare Other | Attending: Emergency Medicine | Admitting: Emergency Medicine

## 2017-03-27 DIAGNOSIS — W19XXXA Unspecified fall, initial encounter: Secondary | ICD-10-CM | POA: Insufficient documentation

## 2017-03-27 DIAGNOSIS — Z79899 Other long term (current) drug therapy: Secondary | ICD-10-CM | POA: Diagnosis not present

## 2017-03-27 DIAGNOSIS — Y999 Unspecified external cause status: Secondary | ICD-10-CM | POA: Diagnosis not present

## 2017-03-27 DIAGNOSIS — R300 Dysuria: Secondary | ICD-10-CM | POA: Diagnosis not present

## 2017-03-27 DIAGNOSIS — R0782 Intercostal pain: Secondary | ICD-10-CM | POA: Insufficient documentation

## 2017-03-27 DIAGNOSIS — Y939 Activity, unspecified: Secondary | ICD-10-CM | POA: Diagnosis not present

## 2017-03-27 DIAGNOSIS — I2510 Atherosclerotic heart disease of native coronary artery without angina pectoris: Secondary | ICD-10-CM | POA: Insufficient documentation

## 2017-03-27 DIAGNOSIS — N390 Urinary tract infection, site not specified: Secondary | ICD-10-CM | POA: Diagnosis not present

## 2017-03-27 DIAGNOSIS — Y929 Unspecified place or not applicable: Secondary | ICD-10-CM | POA: Diagnosis not present

## 2017-03-27 DIAGNOSIS — R82998 Other abnormal findings in urine: Secondary | ICD-10-CM | POA: Diagnosis not present

## 2017-03-27 DIAGNOSIS — G308 Other Alzheimer's disease: Secondary | ICD-10-CM | POA: Diagnosis not present

## 2017-03-27 DIAGNOSIS — F0390 Unspecified dementia without behavioral disturbance: Secondary | ICD-10-CM | POA: Diagnosis not present

## 2017-03-27 DIAGNOSIS — R0781 Pleurodynia: Secondary | ICD-10-CM | POA: Diagnosis not present

## 2017-03-27 DIAGNOSIS — I10 Essential (primary) hypertension: Secondary | ICD-10-CM | POA: Diagnosis not present

## 2017-03-27 DIAGNOSIS — A499 Bacterial infection, unspecified: Secondary | ICD-10-CM | POA: Diagnosis not present

## 2017-03-27 DIAGNOSIS — S279XXA Injury of unspecified intrathoracic organ, initial encounter: Secondary | ICD-10-CM | POA: Diagnosis not present

## 2017-03-27 DIAGNOSIS — R9431 Abnormal electrocardiogram [ECG] [EKG]: Secondary | ICD-10-CM | POA: Diagnosis not present

## 2017-03-27 LAB — URINALYSIS, ROUTINE W REFLEX MICROSCOPIC
Bilirubin Urine: NEGATIVE
Glucose, UA: NEGATIVE mg/dL
HGB URINE DIPSTICK: NEGATIVE
KETONES UR: NEGATIVE mg/dL
Nitrite: NEGATIVE
PROTEIN: NEGATIVE mg/dL
Specific Gravity, Urine: 1.006 (ref 1.005–1.030)
pH: 8 (ref 5.0–8.0)

## 2017-03-27 LAB — BASIC METABOLIC PANEL
Anion gap: 8 (ref 5–15)
BUN: 7 mg/dL (ref 6–20)
CALCIUM: 9.4 mg/dL (ref 8.9–10.3)
CHLORIDE: 109 mmol/L (ref 101–111)
CO2: 23 mmol/L (ref 22–32)
CREATININE: 0.64 mg/dL (ref 0.44–1.00)
GFR calc Af Amer: >60 mL/min (ref >60)
GFR calc non Af Amer: >60 mL/min (ref >60)
GLUCOSE: 101 mg/dL — AB (ref 65–99)
Potassium: 3.6 mmol/L (ref 3.5–5.1)
Sodium: 140 mmol/L (ref 135–145)

## 2017-03-27 LAB — CBC
HEMATOCRIT: 43.9 % (ref 36.0–46.0)
Hemoglobin: 15.5 g/dL — ABNORMAL HIGH (ref 12.0–15.0)
MCH: 32 pg (ref 26.0–34.0)
MCHC: 35.3 g/dL (ref 30.0–36.0)
MCV: 90.5 fL (ref 78.0–100.0)
Platelets: 174 10*3/uL (ref 150–400)
RBC: 4.85 MIL/uL (ref 3.87–5.11)
RDW: 12.7 % (ref 11.5–15.5)
WBC: 4.7 10*3/uL (ref 4.0–10.5)

## 2017-03-27 LAB — I-STAT TROPONIN, ED: Troponin i, poc: 0 ng/mL (ref 0.00–0.08)

## 2017-03-27 NOTE — ED Triage Notes (Signed)
Patient present here with fall last night. Pt c/o pain to right rib cage. Pt c/o tenderness in the abdomen. Pt denies being on blood thinners. Pt have hx dementia. Pt coming from home.

## 2017-03-27 NOTE — Discharge Instructions (Signed)
We sent off a urine culture today.  Someone will call from the ED if the culture is abnormal to call in abx

## 2017-03-27 NOTE — ED Notes (Signed)
Patient transported to X-ray 

## 2017-03-27 NOTE — ED Notes (Signed)
Bed: WA16 Expected date:  Expected time:  Means of arrival:  Comments: EMS 

## 2017-03-27 NOTE — ED Provider Notes (Addendum)
[facility] DEPT Provider Note   CSN: [id number] Arrival date & time: 03/27/17  9470     History   Chief Complaint Fall last night Level V caveat: Dementia HPI Valerie Chen is a 83 y.o. female.  HPI Patient presents to the emergency room for evaluation of pain after fall last night. According to EMS report the patient had a fall last evening. She was complaining of right rib cage pain and some tenderness in her abdomen this morning. Family had her come to the emergency room for evaluation. The history is somewhat limited in that the patient has dementia. Patient did mention having some abdominal discomfort but denied any chest pain. She denies any headache. No vomiting or diarrhea. It is unclear why or how she fell.  Additional hx provided by daughter: The patient's daughter states that she has a moderate reversible she is able to observe her mother.  Her mother was attempting to get out of bed last evening when she slid off the bed and fell to the ground.  She is also concerned that she could be developing a UTI. SHe has been a little bit more confused lately and often times this is a sign of a urinary tract infection  Past Medical History:  Diagnosis Date  . Alzheimer disease   . Arthritis   . CAD (coronary artery disease)    Probable CAD, nuclear February, 2012, possible anteroseptal and inferoseptal ischemia but the study was technically difficult  . Chest pain    Hospital February, 2012, nuclear, possible anteroseptal and inferoseptal ischemia, technically difficult, medical therapy  . Dementia    Significant  . Diastolic dysfunction    Echo, February, 2012  . Diastolic dysfunction   . DNR (do not resuscitate)   . Dyslipidemia   . Edema    hospitalizations September, 2013, some fluid overload  . Ejection fraction    EF 55%, echo, February, 2012  //   EF 55-60%, echo, February 25, 2012 // Echo 12/17: EF 60-65, no RWMA, Gr 1 DD, normal RVSF  . Essential hypertension   .  GI bleed    February, 2012 incomplete colonoscopy, hemorrhoids, needs complete colonoscopy  . Hx: UTI (urinary tract infection)   . Hyperlipidemia   . Leg pain, bilateral   . Major depression    2 overdoses in 1975 & 1977  . Migraines    25 year  . Orthostatic hypotension   . Suicide attempt ([place]) 1985   2 attempts with Valium  . TIA (transient ischemic attack)       ???question of a TIA in February, 2012, carotid Doppler, August 11, 2010 no significant abnormalities    Patient Active Problem List   Diagnosis Date Noted  . Difficulty walking 02/23/2017  . Dementia 01/21/2017  . Abdominal mass 01/21/2017  . NSVT (nonsustained ventricular tachycardia) ([place]) 05/11/2016  . Coarse tremors 05/06/2016  . Syncope 12/29/2015  . Recurrent UTI 02/01/2015  . Essential hypertension 05/30/2013  . Edema   . Psychosis in elderly 03/12/2011  . Coronary artery disease   . Migraine headache   . Diastolic dysfunction   . TIA (transient ischemic attack)   . GI bleed   . Dyslipidemia   . Pain in joints 05/22/2009  . Vitamin D deficiency 01/27/2009  . Anemia 11/21/2008    Past Surgical History:  Procedure Laterality Date  . ABDOMINAL HYSTERECTOMY    . DIAGNOSTIC MAMMOGRAM  12/29/2000   Mammogram, suspicious lesions -BX pending  . TONSILLECTOMY  OB History    No data available       Home Medications    Prior to Admission medications   Medication Sig Start Date End Date Taking? Authorizing Provider  acetaminophen (TYLENOL) 500 MG tablet Take 1,000 mg by mouth every 6 (six) hours as needed for moderate pain.    [provider]  Cholecalciferol (VITAMIN D-3) 1000 units CAPS Take 1,000 Units by mouth daily.     [provider]  Cranberry (SM CRANBERRY) 300 MG tablet Take 300 mg by mouth 3 (three) times daily.     [provider]  cyanocobalamin 500 MCG tablet Take 500 mcg by mouth daily.     [provider]  docusate sodium (COLACE) 100 MG  capsule Take 200 mg by mouth daily.    [provider]  guaiFENesin-dextromethorphan (ROBITUSSIN DM) 100-10 MG/5ML syrup Take 5 mLs by mouth every 4 (four) hours as needed for cough. 02/19/17   Dessa Phi Chahn-Yang, DO  HYDROcodone-acetaminophen (NORCO/VICODIN) 5-325 MG tablet Take 1 tablet by mouth every 6 (six) hours as needed. Patient not taking: Reported on 02/23/2017 01/21/17   Drenda Freeze, MD  polyethylene glycol [facility] / Floria Raveling) packet Take 17 g by mouth daily. Patient not taking: Reported on 02/23/2017 01/23/17   Dessa Phi Chahn-Yang, DO  QUEtiapine (SEROQUEL) 25 MG tablet Take 1 tablet (25 mg total) by mouth every evening. 6pm 12/06/16   Briscoe Deutscher, DO    Family History Family History  Problem Relation Age of Onset  . Heart attack Father        several heart attacks  . Cancer Father        bone  . Arthritis Father   . Suicidality Mother   . Depression Mother   . Heart attack Brother   . Heart disease Brother   . Pneumonia Brother   . Suicidality Son   . Hypertension Daughter   . Depression Other        Most family members    Social History Social History  Substance Use Topics  . Smoking status: Never Smoker  . Smokeless tobacco: Never Used  . Alcohol use No     Allergies   Levaquin [levofloxacin] and Statins   Review of Systems Review of Systems  All other systems reviewed and are negative.    Physical Exam Updated Vital Signs BP (!) 166/70 (BP Location: Right Arm)   Pulse (!) 59   Temp 97.6 F (36.4 C) (Oral)   Resp 16   SpO2 97%   Physical Exam  Constitutional: No distress.  HENT:  Head: Normocephalic and atraumatic.  Right Ear: External ear normal.  Left Ear: External ear normal.  Eyes: Conjunctivae are normal. Right eye exhibits no discharge. Left eye exhibits no discharge. No scleral icterus.  Neck: Neck supple. No tracheal deviation present.  Cardiovascular: Normal rate, regular rhythm and intact distal pulses.     Pulmonary/Chest: Effort normal and breath sounds normal. No stridor. No respiratory distress. She has no wheezes. She has no rales. She exhibits no tenderness.  No tenderness to palpation of the left and right anterior and posterior chest wall  Abdominal: Soft. Bowel sounds are normal. She exhibits no distension. There is no tenderness. There is no rebound and no guarding.  Musculoskeletal: She exhibits no edema or tenderness.       Right shoulder: She exhibits no tenderness, no bony tenderness and no swelling.       Left shoulder: She exhibits no tenderness,  no bony tenderness and no swelling.       Right wrist: She exhibits no tenderness, no bony tenderness and no swelling.       Left wrist: She exhibits no tenderness, no bony tenderness and no swelling.       Right hip: She exhibits normal range of motion, no tenderness, no bony tenderness and no swelling.       Left hip: She exhibits normal range of motion, no tenderness and no bony tenderness.       Right ankle: She exhibits no swelling. No tenderness.       Left ankle: She exhibits no swelling. No tenderness.       Cervical back: She exhibits no tenderness, no bony tenderness and no swelling.       Thoracic back: She exhibits no tenderness, no bony tenderness and no swelling.       Lumbar back: She exhibits no tenderness, no bony tenderness and no swelling.  Neurological: She is alert. She has normal strength. No cranial nerve deficit (no facial droop, extraocular movements intact, no slurred speech) or sensory deficit. She exhibits normal muscle tone. She displays no seizure activity. Coordination normal. GCS eye subscore is 4. GCS verbal subscore is 4. GCS motor subscore is 6.  Skin: Skin is warm and dry. No rash noted. She is not diaphoretic.  Psychiatric: She has a normal mood and affect.  Nursing note and vitals reviewed.    ED Treatments / Results  Labs (all labs ordered are listed, but only abnormal results are displayed) Labs  Reviewed  CBC - Abnormal; Notable for the following:       Result Value   Hemoglobin 15.5 (*)    All other components within normal limits  BASIC METABOLIC PANEL - Abnormal; Notable for the following:    Glucose, Bld 101 (*)    All other components within normal limits  URINALYSIS, ROUTINE W REFLEX MICROSCOPIC - Abnormal; Notable for the following:    Color, Urine STRAW (*)    Leukocytes, UA SMALL (*)    Bacteria, UA RARE (*)    Squamous Epithelial / LPF 0-5 (*)    All other components within normal limits  URINE CULTURE  I-STAT TROPONIN, ED    Radiology Dg Chest 2 View  Result Date: 03/27/2017 CLINICAL DATA:  Patient present here with fall last night. Patient complains of pain to right rib cage. Patient complains of tenderness in the abdomen. Patient denies being on blood thinners. EXAM: CHEST  2 VIEW COMPARISON:  02/19/2017 FINDINGS: The heart size and mediastinal contours are within normal limits. Both lungs are clear. The visualized skeletal structures are unremarkable. IMPRESSION: No active cardiopulmonary disease. Electronically Signed   By: Kathreen Devoid   On: 03/27/2017 09:50   EKG   EKG Interpretation  Date/Time:  Sunday March 27 2017 09:14:41 EDT Ventricular Rate:  58 PR Interval:    QRS Duration: 87 QT Interval:  447 QTC Calculation: 439 R Axis:   74 Text Interpretation:  Sinus rhythm Low voltage, precordial leads No significant change since last tracing Confirmed by Dorie Rank [phone]) on 03/27/2017 11:43:37 AM       Procedures Procedures (including critical care time)  Medications Ordered in ED Medications - No data to display   Initial Impression / Assessment and Plan / ED Course  I have reviewed the triage vital signs and the nursing notes.  Pertinent labs & imaging results that were available during my care of the patient were  reviewed by me and considered in my medical decision making (see chart for details).  Clinical Course as of Mar 27 1142  Nancy Fetter  Mar 27, 2017  0908 Pt denies any pain on exam.  Will check labs, cxr.  Will get additional information from family when they arrive  [JK]    Clinical Course User Index [JK] Dorie Rank, MD   Labs and x-rays are normal.  UA does not suggest uti, will send culture based on daughter's concern.  No signs of serious injury.    At this time there does not appear to be any evidence of an acute emergency medical condition and the patient appears stable for discharge with appropriate outpatient follow up.   Final Clinical Impressions(s) / ED Diagnoses   Final diagnoses:  Fall, initial encounter    New Prescriptions New Prescriptions   No medications on file         Dorie Rank, MD 03/27/17 1146

## 2017-03-28 LAB — URINE CULTURE: Culture: >=100000 — AB

## 2017-03-29 ENCOUNTER — Telehealth: Payer: Self-pay | Admitting: Emergency Medicine

## 2017-03-29 NOTE — Telephone Encounter (Signed)
Post ED Visit - Positive Culture Follow-up  Culture report reviewed by antimicrobial stewardship pharmacist:  []  Elenor Quinones, Pharm.D. [x]  Heide Guile, Pharm.D., BCPS AQ-ID []  Parks Neptune, Pharm.D., BCPS []  Alycia Rossetti, Pharm.D., BCPS []  Malcolm, Pharm.D., BCPS, AAHIVP []  Legrand Como, Pharm.D., BCPS, AAHIVP []  Salome Arnt, PharmD, BCPS []  Dimitri Ped, PharmD, BCPS []  Vincenza Hews, PharmD, BCPS  Positive urine culture Treated with none, asymptomatic, no further patient follow-up is required at this time.  Hazle Nordmann 03/29/2017, 3:31 PM

## 2017-04-04 ENCOUNTER — Ambulatory Visit (HOSPITAL_BASED_OUTPATIENT_CLINIC_OR_DEPARTMENT_OTHER): Payer: Medicare Other | Admitting: Hematology & Oncology

## 2017-04-04 ENCOUNTER — Other Ambulatory Visit (HOSPITAL_BASED_OUTPATIENT_CLINIC_OR_DEPARTMENT_OTHER): Payer: Medicare Other

## 2017-04-04 ENCOUNTER — Ambulatory Visit (HOSPITAL_BASED_OUTPATIENT_CLINIC_OR_DEPARTMENT_OTHER)
Admission: RE | Admit: 2017-04-04 | Discharge: 2017-04-04 | Disposition: A | Discharge status: Home / Self Care | Payer: Medicare Other | Source: Ambulatory Visit | Attending: Hematology & Oncology | Admitting: Hematology & Oncology

## 2017-04-04 VITALS — BP 144/66 | HR 86 | Temp 98.0°F | Wt 177.0 lb

## 2017-04-04 DIAGNOSIS — R1907 Generalized intra-abdominal and pelvic swelling, mass and lump: Secondary | ICD-10-CM

## 2017-04-04 DIAGNOSIS — R1909 Other intra-abdominal and pelvic swelling, mass and lump: Secondary | ICD-10-CM

## 2017-04-04 DIAGNOSIS — K6389 Other specified diseases of intestine: Secondary | ICD-10-CM | POA: Diagnosis not present

## 2017-04-04 DIAGNOSIS — F0390 Unspecified dementia without behavioral disturbance: Secondary | ICD-10-CM | POA: Diagnosis not present

## 2017-04-04 DIAGNOSIS — R1900 Intra-abdominal and pelvic swelling, mass and lump, unspecified site: Secondary | ICD-10-CM

## 2017-04-04 DIAGNOSIS — I700 Atherosclerosis of aorta: Secondary | ICD-10-CM | POA: Insufficient documentation

## 2017-04-04 DIAGNOSIS — Z23 Encounter for immunization: Secondary | ICD-10-CM | POA: Diagnosis not present

## 2017-04-04 LAB — CMP (CANCER CENTER ONLY)
ALK PHOS: 80 U/L (ref 26–84)
ALT: 27 U/L (ref 10–47)
AST: 25 U/L (ref 11–38)
Albumin: 4 g/dL (ref 3.3–5.5)
BUN, Bld: 9 mg/dL (ref 7–22)
CALCIUM: 9.6 mg/dL (ref 8.0–10.3)
CHLORIDE: 104 meq/L (ref 98–108)
CO2: 28 mEq/L (ref 18–33)
Creat: 0.8 mg/dl (ref 0.6–1.2)
GLUCOSE: 91 mg/dL (ref 73–118)
POTASSIUM: 3.9 meq/L (ref 3.3–4.7)
Sodium: 141 mEq/L (ref 128–145)
Total Bilirubin: 0.8 mg/dl (ref 0.20–1.60)
Total Protein: 6.8 g/dL (ref 6.4–8.1)

## 2017-04-04 LAB — CBC WITH DIFFERENTIAL (CANCER CENTER ONLY)
BASO#: 0 10*3/uL (ref 0.0–0.2)
BASO%: 0.2 % (ref 0.0–2.0)
EOS ABS: 0 10*3/uL (ref 0.0–0.5)
EOS%: 0 % (ref 0.0–7.0)
HEMATOCRIT: 45.9 % (ref 34.8–46.6)
HGB: 15.8 g/dL (ref 11.6–15.9)
LYMPH#: 1.4 10*3/uL (ref 0.9–3.3)
LYMPH%: 32.9 % (ref 14.0–48.0)
MCH: 31.5 pg (ref 26.0–34.0)
MCHC: 34.4 g/dL (ref 32.0–36.0)
MCV: 91 fL (ref 81–101)
MONO#: 0.3 10*3/uL (ref 0.1–0.9)
MONO%: 8.2 % (ref 0.0–13.0)
NEUT#: 2.4 10*3/uL (ref 1.5–6.5)
NEUT%: 58.7 % (ref 39.6–80.0)
PLATELETS: 155 10*3/uL (ref 145–400)
RBC: 5.02 10*6/uL (ref 3.70–5.32)
RDW: 13 % (ref 11.1–15.7)
WBC: 4.1 10*3/uL (ref 3.9–10.0)

## 2017-04-04 LAB — LACTATE DEHYDROGENASE: LDH: 219 U/L (ref 125–245)

## 2017-04-04 MED ORDER — INFLUENZA VAC SPLIT QUAD 0.5 ML IM SUSY
PREFILLED_SYRINGE | INTRAMUSCULAR | Status: AC
  Filled 2017-04-04: qty 0.5

## 2017-04-04 MED ORDER — IOPAMIDOL (ISOVUE-300) INJECTION 61%
100.0000 mL | Freq: Once | INTRAVENOUS | Status: AC | PRN
  Administered 2017-04-04: 100 mL via INTRAVENOUS

## 2017-04-04 MED ORDER — INFLUENZA VAC SPLIT QUAD 0.5 ML IM SUSY
0.5000 mL | PREFILLED_SYRINGE | Freq: Once | INTRAMUSCULAR | Status: AC
  Administered 2017-04-04: 0.5 mL via INTRAMUSCULAR

## 2017-04-04 NOTE — Progress Notes (Signed)
Hematology and Oncology Follow Up Visit  Valerie Chen [id number] [birth year] 83 y.o. 04/04/2017   Principle Diagnosis:   Left mesenteric mass  Current Therapy:    Observation     Interim History:  Valerie Chen is in for Valerie Chen follow-up. Valerie Chen comes in with Valerie Chen caretaker. Valerie Chen Valerie Chen is on the cell phone listening.  We did do a CT scan on Valerie Chen today. The CT scan showed some growth of Valerie Chen mesenteric mass. It now measures 5.9 x 3.9 x 7.6 cm. In volume, this is 92 cm. Before, Valerie Chen tumor measured 64 cm and volume.  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 No portal venous gas is noted.  Valerie Chen's been totally asymptomatic. Valerie Chen is eating well. Valerie Chen's having no abdominal pain. There is no melena or hematochezia. Valerie Chen's had no nausea or vomiting. There's been no fever.  Valerie Chen has bad dementia.  Valerie Chen Valerie Chen does not want Valerie Chen to undergo any aggressive interventions if something were to happen.  Valerie Chen's had no problems with urine. Valerie Chen's had no hematuria.  Valerie Chen's had no leg swelling.  Overall, Valerie Chen performance status is Valerie Chen.   Medications:  Current Outpatient Prescriptions:  .  acetaminophen (TYLENOL) 500 MG tablet, Take 1,000 mg by mouth every 6 (six) hours as needed for moderate pain., Disp: , Rfl:  .  cephALEXin (KEFLEX) 500 MG capsule, Take 500 mg by mouth 3 (three) times daily., Disp: , Rfl: 0 .  Cholecalciferol (VITAMIN D-3) 1000 units CAPS, Take 1,000 Units by mouth daily. , Disp: , Rfl:  .  Cranberry (SM CRANBERRY) 300 MG tablet, Take 300 mg by mouth 3 (three) times daily. , Disp: , Rfl:  .  cyanocobalamin 500 MCG tablet, Take 500 mcg by mouth daily. , Disp: , Rfl:  .  docusate sodium (COLACE) 100 MG capsule, Take 200 mg by mouth daily., Disp: , Rfl:  .  guaiFENesin-dextromethorphan (ROBITUSSIN DM) 100-10 MG/5ML syrup, Take 5 mLs by mouth every 4 (four) hours as needed for  cough., Disp: 118 mL, Rfl: 0 .  HYDROcodone-acetaminophen (NORCO/VICODIN) 5-325 MG tablet, Take 1 tablet by mouth every 6 (six) hours as needed., Disp: 10 tablet, Rfl: 0 .  polyethylene glycol (MIRALAX / GLYCOLAX) packet, Take 17 g by mouth daily., Disp: 14 each, Rfl: 0 .  QUEtiapine (SEROQUEL) 25 MG tablet, Take 1 tablet (25 mg total) by mouth every evening. 6pm, Disp: 90 tablet, Rfl: Chen  Current Facility-Administered Medications:  .  Influenza vac split quadrivalent PF (FLUARIX) injection 0.5 mL, 0.5 mL, Intramuscular, Once, Ennever, Rudell Cobb, MD  Allergies:  Allergies  Allergen Reactions  . Levaquin [Levofloxacin] Other (See Comments)    Reaction:  Altered mental status   . Statins Other (See Comments)    Severe myalgias    Past Medical History, Surgical history, Social history, and Valerie Chen History were reviewed and updated.  Review of Systems: Review of Systems  Constitutional: Negative for appetite change, fatigue, fever and unexpected weight change.  HENT:   Negative for lump/mass, mouth sores, sore throat and trouble swallowing.   Respiratory: Negative for cough, hemoptysis and shortness of breath.   Cardiovascular: Negative for leg swelling and palpitations.  Gastrointestinal: Negative for abdominal distention, abdominal pain, blood in stool, constipation, diarrhea, nausea and vomiting.  Genitourinary: Negative for bladder incontinence, dysuria, frequency and hematuria.   Musculoskeletal: Negative for arthralgias, back pain, gait problem and myalgias.  Skin: Negative for  itching and rash.  Neurological: Negative for dizziness, extremity weakness, gait problem, headaches, numbness, seizures and speech difficulty.  Hematological: Does not bruise/bleed easily.  Psychiatric/Behavioral: Negative for depression and sleep disturbance. The patient is not nervous/anxious.     Physical Exam:  weight is 177 lb (80.3 kg). Valerie Chen oral temperature is 98 F (36.7 C). Valerie Chen blood pressure is  144/66 (abnormal) and Valerie Chen pulse is 86.   Wt Readings from Last 3 Encounters:  04/04/17 177 lb (80.3 kg)  02/23/17 181 lb 12.8 oz (82.5 kg)  02/22/17 183 lb 1.9 oz (83.1 kg)    Physical Exam  Constitutional: Valerie Chen is oriented to person, place, and time.  HENT:  Head: Normocephalic and atraumatic.  Mouth/Throat: Oropharynx is clear and moist.  Eyes: Pupils are equal, round, and reactive to light. EOM are normal.  Neck: Normal range of motion.  Cardiovascular: Normal rate, regular rhythm and normal heart sounds.   Pulmonary/Chest: Effort normal and breath sounds normal.  Abdominal: Soft. Bowel sounds are normal.  Musculoskeletal: Normal range of motion. Valerie Chen exhibits no edema, tenderness or deformity.  Lymphadenopathy:    Valerie Chen has no cervical adenopathy.  Neurological: Valerie Chen is alert and oriented to person, place, and time.  Skin: Skin is warm and dry. No rash noted. No erythema.  Psychiatric: Valerie Chen has a normal mood and affect. Valerie Chen behavior is normal. Judgment and thought content normal.  Vitals reviewed.    Lab Results  Component Value Date   WBC 4.1 04/04/2017   HGB 15.8 04/04/2017   HCT 45.9 04/04/2017   MCV 91 04/04/2017   PLT 155 04/04/2017     Chemistry      Component Value Date/Time   NA 140 03/27/2017 0924   K 3.6 03/27/2017 0924   CL 109 03/27/2017 0924   CO2 23 03/27/2017 0924   BUN 7 03/27/2017 0924   CREATININE 0.64 03/27/2017 0924      Component Value Date/Time   CALCIUM 9.4 03/27/2017 0924   ALKPHOS 55 02/18/2017 0339   AST 19 02/18/2017 0339   ALT 17 02/18/2017 0339   BILITOT 1.0 02/18/2017 0339         Impression and Plan: Valerie Chen is a 83 year old white female. Valerie Chen has bad dementia. However, Valerie Chen does have some functionality.  Again, Valerie Chen is a symptomatic with respect to both the mass and the pneumatosis.  I am not sure what this pneumatosis signifies. I don't see anything that would suggest bowel ischemia.  I think we should repeat Valerie Chen CT scan  in 3 months. I think this would be reasonable.  If Valerie Chen has any abdominal symptoms before then, we can certainly see Valerie Chen back and scan Valerie Chen sooner.  Valerie Chen did get Valerie Chen flu shot.  I spent about 40 minutes today with Valerie Chen and Valerie Chen Valerie Chen who is on the cell phone. I was answering questions. I reviewed the CT scan and labs.  Volanda Napoleon, MD 10/15/201811:57 AM

## 2017-04-06 LAB — CHROMOGRANIN A: Chromogranin A: 1 nmol/L (ref 0–5)

## 2017-05-04 ENCOUNTER — Emergency Department (HOSPITAL_COMMUNITY): Payer: Medicare Other

## 2017-05-04 ENCOUNTER — Other Ambulatory Visit: Payer: Self-pay

## 2017-05-04 ENCOUNTER — Encounter (HOSPITAL_COMMUNITY): Payer: Self-pay | Admitting: Emergency Medicine

## 2017-05-04 ENCOUNTER — Emergency Department (HOSPITAL_COMMUNITY)
Admission: EM | Admit: 2017-05-04 | Discharge: 2017-05-05 | Disposition: A | Discharge status: Home / Self Care | Payer: Medicare Other | Attending: Emergency Medicine | Admitting: Emergency Medicine

## 2017-05-04 DIAGNOSIS — F0281 Dementia in other diseases classified elsewhere with behavioral disturbance: Secondary | ICD-10-CM

## 2017-05-04 DIAGNOSIS — R410 Disorientation, unspecified: Secondary | ICD-10-CM | POA: Diagnosis not present

## 2017-05-04 DIAGNOSIS — N3000 Acute cystitis without hematuria: Secondary | ICD-10-CM | POA: Diagnosis not present

## 2017-05-04 DIAGNOSIS — I10 Essential (primary) hypertension: Secondary | ICD-10-CM | POA: Diagnosis not present

## 2017-05-04 DIAGNOSIS — Z79899 Other long term (current) drug therapy: Secondary | ICD-10-CM | POA: Insufficient documentation

## 2017-05-04 DIAGNOSIS — B9689 Other specified bacterial agents as the cause of diseases classified elsewhere: Secondary | ICD-10-CM | POA: Diagnosis not present

## 2017-05-04 DIAGNOSIS — R402441 Other coma, without documented Glasgow coma scale score, or with partial score reported, in the field [EMT or ambulance]: Secondary | ICD-10-CM | POA: Diagnosis not present

## 2017-05-04 DIAGNOSIS — I2510 Atherosclerotic heart disease of native coronary artery without angina pectoris: Secondary | ICD-10-CM | POA: Insufficient documentation

## 2017-05-04 DIAGNOSIS — G300 Alzheimer's disease with early onset: Secondary | ICD-10-CM | POA: Diagnosis not present

## 2017-05-04 DIAGNOSIS — I517 Cardiomegaly: Secondary | ICD-10-CM | POA: Diagnosis not present

## 2017-05-04 LAB — URINALYSIS, ROUTINE W REFLEX MICROSCOPIC
BACTERIA UA: NONE SEEN
Bilirubin Urine: NEGATIVE
Glucose, UA: NEGATIVE mg/dL
Hgb urine dipstick: NEGATIVE
KETONES UR: NEGATIVE mg/dL
Nitrite: NEGATIVE
PH: 8 (ref 5.0–8.0)
PROTEIN: NEGATIVE mg/dL
Specific Gravity, Urine: 1.006 (ref 1.005–1.030)

## 2017-05-04 LAB — CBC WITH DIFFERENTIAL/PLATELET
BASOS PCT: 0 %
Basophils Absolute: 0 10*3/uL (ref 0.0–0.1)
Eosinophils Absolute: 0 10*3/uL (ref 0.0–0.7)
Eosinophils Relative: 0 %
HEMATOCRIT: 41.7 % (ref 36.0–46.0)
HEMOGLOBIN: 14.4 g/dL (ref 12.0–15.0)
LYMPHS ABS: 1.9 10*3/uL (ref 0.7–4.0)
Lymphocytes Relative: 42 %
MCH: 31.4 pg (ref 26.0–34.0)
MCHC: 34.5 g/dL (ref 30.0–36.0)
MCV: 91 fL (ref 78.0–100.0)
MONOS PCT: 8 %
Monocytes Absolute: 0.4 10*3/uL (ref 0.1–1.0)
NEUTROS ABS: 2.2 10*3/uL (ref 1.7–7.7)
NEUTROS PCT: 50 %
Platelets: 173 10*3/uL (ref 150–400)
RBC: 4.58 MIL/uL (ref 3.87–5.11)
RDW: 12.6 % (ref 11.5–15.5)
WBC: 4.5 10*3/uL (ref 4.0–10.5)

## 2017-05-04 LAB — I-STAT CHEM 8, ED
BUN: 9 mg/dL (ref 6–20)
CREATININE: 0.7 mg/dL (ref 0.44–1.00)
Calcium, Ion: 1.02 mmol/L — ABNORMAL LOW (ref 1.15–1.40)
Chloride: 109 mmol/L (ref 101–111)
Glucose, Bld: 89 mg/dL (ref 65–99)
HEMATOCRIT: 39 % (ref 36.0–46.0)
Hemoglobin: 13.3 g/dL (ref 12.0–15.0)
Potassium: 3.3 mmol/L — ABNORMAL LOW (ref 3.5–5.1)
Sodium: 140 mmol/L (ref 135–145)
TCO2: 21 mmol/L — AB (ref 22–32)

## 2017-05-04 NOTE — ED Provider Notes (Signed)
[facility] DEPT Provider Note   CSN: [id number] Arrival date & time: 05/04/17  2201     History   Chief Complaint Chief Complaint  Patient presents with  . Increased Confusion  . Abdominal Pain    HPI Valerie Chen is a 83 y.o. female.  The history is provided by the patient.  Altered Mental Status   This is a recurrent problem. The current episode started 2 days ago. The problem has not changed since onset.Associated symptoms include confusion. Pertinent negatives include no somnolence, no seizures, no unresponsiveness and no violence. Risk factors: advanced Alzheimer's and abdominal mass. Her past medical history is significant for dementia. Her past medical history does not include seizures.    Past Medical History:  Diagnosis Date  . Alzheimer disease   . Arthritis   . CAD (coronary artery disease)    Probable CAD, nuclear February, 2012, possible anteroseptal and inferoseptal ischemia but the study was technically difficult  . Chest pain    Hospital February, 2012, nuclear, possible anteroseptal and inferoseptal ischemia, technically difficult, medical therapy  . Dementia    Significant  . Diastolic dysfunction    Echo, February, 2012  . Diastolic dysfunction   . DNR (do not resuscitate)   . Dyslipidemia   . Edema    hospitalizations September, 2013, some fluid overload  . Ejection fraction    EF 55%, echo, February, 2012  //   EF 55-60%, echo, February 25, 2012 // Echo 12/17: EF 60-65, no RWMA, Gr 1 DD, normal RVSF  . Essential hypertension   . GI bleed    February, 2012 incomplete colonoscopy, hemorrhoids, needs complete colonoscopy  . Hx: UTI (urinary tract infection)   . Hyperlipidemia   . Leg pain, bilateral   . Major depression    2 overdoses in 1975 & 1977  . Migraines    25 year  . Orthostatic hypotension   . Suicide attempt ([place]) 1985   2 attempts with Valium  . TIA (transient ischemic attack)       ???question of  a TIA in February, 2012, carotid Doppler, August 11, 2010 no significant abnormalities    Patient Active Problem List   Diagnosis Date Noted  . Difficulty walking 02/23/2017  . Dementia 01/21/2017  . Abdominal mass 01/21/2017  . NSVT (nonsustained ventricular tachycardia) ([place]) 05/11/2016  . Coarse tremors 05/06/2016  . Syncope 12/29/2015  . Recurrent UTI 02/01/2015  . Essential hypertension 05/30/2013  . Edema   . Psychosis in elderly 03/12/2011  . Coronary artery disease   . Migraine headache   . Diastolic dysfunction   . TIA (transient ischemic attack)   . GI bleed   . Dyslipidemia   . Pain in joints 05/22/2009  . Vitamin D deficiency 01/27/2009  . Anemia 11/21/2008    Past Surgical History:  Procedure Laterality Date  . ABDOMINAL HYSTERECTOMY    . DIAGNOSTIC MAMMOGRAM  12/29/2000   Mammogram, suspicious lesions -BX pending  . TONSILLECTOMY      OB History    No data available       Home Medications    Prior to Admission medications   Medication Sig Start Date End Date Taking? Authorizing Provider  acetaminophen (TYLENOL) 500 MG tablet Take 1,000 mg by mouth every 6 (six) hours as needed for moderate pain.   Yes [provider]  Cholecalciferol (VITAMIN D-3) 1000 units CAPS Take 1,000 Units by mouth daily.    Yes [provider]  Cranberry (SM  CRANBERRY) 300 MG tablet Take 300 mg by mouth 3 (three) times daily.    Yes [provider]  cyanocobalamin 500 MCG tablet Take 500 mcg by mouth daily.    Yes [provider]  docusate sodium (COLACE) 100 MG capsule Take 200 mg by mouth daily.   Yes [provider]  guaiFENesin-dextromethorphan (ROBITUSSIN DM) 100-10 MG/5ML syrup Take 5 mLs by mouth every 4 (four) hours as needed for cough. 02/19/17  Yes Dessa Phi, DO  HYDROcodone-acetaminophen (NORCO/VICODIN) 5-325 MG tablet Take 1 tablet by mouth every 6 (six) hours as needed. 01/21/17  Yes Drenda Freeze, MD    polyethylene glycol [facility] / Floria Raveling) packet Take 17 g by mouth daily. 01/23/17  Yes Dessa Phi, DO  QUEtiapine (SEROQUEL) 25 MG tablet Take 1 tablet (25 mg total) by mouth every evening. 6pm 12/06/16  Yes Briscoe Deutscher, DO    Family History Family History  Problem Relation Age of Onset  . Heart attack Father        several heart attacks  . Cancer Father        bone  . Arthritis Father   . Suicidality Mother   . Depression Mother   . Heart attack Brother   . Heart disease Brother   . Pneumonia Brother   . Suicidality Son   . Hypertension Daughter   . Depression Other        Most family members    Social History Social History   Tobacco Use  . Smoking status: Never Smoker  . Smokeless tobacco: Never Used  Substance Use Topics  . Alcohol use: No  . Drug use: Yes    Comment: hx of valium in 1980's - had 2 overdoses per daughter     Allergies   Levaquin [levofloxacin] and Statins   Review of Systems Review of Systems  Constitutional: Negative for fever.  HENT: Negative for facial swelling.   Respiratory: Negative for shortness of breath.   Musculoskeletal: Negative for gait problem.  Neurological: Negative for seizures.  Psychiatric/Behavioral: Positive for confusion. Negative for decreased concentration.  All other systems reviewed and are negative.    Physical Exam Updated Vital Signs BP (!) 153/74 (BP Location: Right Arm)   Pulse (!) 57   Temp (!) 97.5 F (36.4 C) (Oral)   Resp 18   SpO2 98%   Physical Exam  Constitutional: She appears well-developed and well-nourished. No distress.  HENT:  Head: Normocephalic and atraumatic.  Right Ear: External ear normal.  Left Ear: External ear normal.  Mouth/Throat: Oropharynx is clear and moist. No oropharyngeal exudate.  Eyes: Conjunctivae are normal. Pupils are equal, round, and reactive to light.  Neck: Normal range of motion. Neck supple. No JVD present.  Cardiovascular: Normal rate, regular  rhythm, normal heart sounds and intact distal pulses.  Pulmonary/Chest: Effort normal and breath sounds normal. No stridor. She has no wheezes. She has no rales.  Abdominal: Soft. Bowel sounds are normal. She exhibits no mass. There is no tenderness. There is no rebound and no guarding.  Musculoskeletal: Normal range of motion. She exhibits no edema.  Lymphadenopathy:    She has no cervical adenopathy.  Neurological: She is alert. She displays normal reflexes.  Skin: Skin is warm and dry. Capillary refill takes less than 2 seconds.  Psychiatric: She has a normal mood and affect. Her mood appears not anxious.     ED Treatments / Results   Vitals:   05/04/17 2219  BP: (!) 153/74  Pulse: (!) 57  Resp: 18  Temp: (!) 97.5 F (36.4 C)  SpO2: 98%    Labs (all labs ordered are listed, but only abnormal results are displayed) Results for orders placed or performed during the hospital encounter of 05/04/17  CBC with Differential/Platelet  Result Value Ref Range   WBC 4.5 4.0 - 10.5 K/uL   RBC 4.58 3.87 - 5.11 MIL/uL   Hemoglobin 14.4 12.0 - 15.0 g/dL   HCT 41.7 36.0 - 46.0 %   MCV 91.0 78.0 - 100.0 fL   MCH 31.4 26.0 - 34.0 pg   MCHC 34.5 30.0 - 36.0 g/dL   RDW 12.6 11.5 - 15.5 %   Platelets 173 150 - 400 K/uL   Neutrophils Relative % 50 %   Neutro Abs 2.2 1.7 - 7.7 K/uL   Lymphocytes Relative 42 %   Lymphs Abs 1.9 0.7 - 4.0 K/uL   Monocytes Relative 8 %   Monocytes Absolute 0.4 0.1 - 1.0 K/uL   Eosinophils Relative 0 %   Eosinophils Absolute 0.0 0.0 - 0.7 K/uL   Basophils Relative 0 %   Basophils Absolute 0.0 0.0 - 0.1 K/uL  Comprehensive metabolic panel  Result Value Ref Range   Sodium 139 135 - 145 mmol/L   Potassium 3.3 (L) 3.5 - 5.1 mmol/L   Chloride 109 101 - 111 mmol/L   CO2 21 (L) 22 - 32 mmol/L   Glucose, Bld 93 65 - 99 mg/dL   BUN 11 6 - 20 mg/dL   Creatinine, Ser 0.73 0.44 - 1.00 mg/dL   Calcium 8.9 8.9 - 10.3 mg/dL   Total Protein 5.8 (L) 6.5 - 8.1 g/dL    Albumin 3.6 3.5 - 5.0 g/dL   AST 18 15 - 41 U/L   ALT 18 14 - 54 U/L   Alkaline Phosphatase 65 38 - 126 U/L   Total Bilirubin 0.8 0.3 - 1.2 mg/dL   GFR calc non Af Amer >60 >60 mL/min   GFR calc Af Amer >60 >60 mL/min   Anion gap 9 5 - 15  Lipase, blood  Result Value Ref Range   Lipase 30 11 - 51 U/L  Urinalysis, Routine w reflex microscopic  Result Value Ref Range   Color, Urine STRAW (A) YELLOW   APPearance CLEAR CLEAR   Specific Gravity, Urine 1.006 1.005 - 1.030   pH 8.0 5.0 - 8.0   Glucose, UA NEGATIVE NEGATIVE mg/dL   Hgb urine dipstick NEGATIVE NEGATIVE   Bilirubin Urine NEGATIVE NEGATIVE   Ketones, ur NEGATIVE NEGATIVE mg/dL   Protein, ur NEGATIVE NEGATIVE mg/dL   Nitrite NEGATIVE NEGATIVE   Leukocytes, UA LARGE (A) NEGATIVE   RBC / HPF 0-5 0 - 5 RBC/hpf   WBC, UA TOO NUMEROUS TO COUNT 0 - 5 WBC/hpf   Bacteria, UA NONE SEEN NONE SEEN   Squamous Epithelial / LPF 0-5 (A) NONE SEEN  Ammonia  Result Value Ref Range   Ammonia 20 9 - 35 umol/L  I-stat chem 8, ed  Result Value Ref Range   Sodium 140 135 - 145 mmol/L   Potassium 3.3 (L) 3.5 - 5.1 mmol/L   Chloride 109 101 - 111 mmol/L   BUN 9 6 - 20 mg/dL   Creatinine, Ser 0.70 0.44 - 1.00 mg/dL   Glucose, Bld 89 65 - 99 mg/dL   Calcium, Ion 1.02 (L) 1.15 - 1.40 mmol/L   TCO2 21 (L) 22 - 32 mmol/L   Hemoglobin 13.3 12.0 - 15.0  g/dL   HCT 39.0 36.0 - 46.0 %   Dg Chest 2 View  Result Date: 05/05/2017 CLINICAL DATA:  Increasing confusion EXAM: CHEST  2 VIEW COMPARISON:  03/27/2017 FINDINGS: Mild cardiomegaly. No focal consolidation or effusion. Aortic atherosclerosis. No pneumothorax. Degenerative changes of the spine. IMPRESSION: Mild cardiomegaly.  Negative for edema or infiltrate. Electronically Signed   By: Donavan Foil M.D.   On: 05/05/2017 00:03      Procedures Procedures (including critical care time)    Final Clinical Impressions(s) / ED Diagnoses   UTI:  No signs of systemic infection nor sepsis.   Patient is well appearing with normal vitals.  Family is comfortable taking patient home with close follow up.  One dose of IV antibiotics given, urine cultured per protocol and keflex RX for 10 days.    All questions answered to the patient's satisfaction.   Strict return precautions for swelling or the lips or tongue, chest pain, dyspnea on exertion, new weakness or numbness changes in vision or speech, fevers, weakness persistent pain, Inability to tolerate liquids or food, changes in voice cough, altered mental status or any concerns. No signs of systemic illness or infection. The patient is nontoxic-appearing on exam and vital signs are within normal limits.    I have reviewed the triage vital signs and the nursing notes. Pertinent labs &imaging results that were available during my care of the patient were reviewed by me and considered in my medical decision making (see chart for details).  After history, exam, and medical workup I feel the patient has been appropriately medically screened and is safe for discharge home. Pertinent diagnoses were discussed with the patient. Patient was given return precautions.     Katlynn Naser, MD 05/05/17 [phone]

## 2017-05-04 NOTE — ED Notes (Signed)
Bed: [facility] Expected date:  Expected time:  Means of arrival:  Comments: EMS 83 yo female increased confusion/has Alzheimers/also complaining abdominal pain-?hx tumor in abdomen-from home

## 2017-05-04 NOTE — ED Triage Notes (Signed)
Pt brought in by EMS from home with c/o increased confusion and abdominal pain.  Pt has Alzheimer's Dementia, but daughter reported that pt has been more confused than usual.  Pt's daughter also reported that pt has been complaining of intermittent headache for the past few days.  Pt c/o abdominal pain to EMS on scene---- pt has hx of "stomach tumor"; pt denies nausea or vomiting.

## 2017-05-05 DIAGNOSIS — N3000 Acute cystitis without hematuria: Secondary | ICD-10-CM | POA: Diagnosis not present

## 2017-05-05 LAB — COMPREHENSIVE METABOLIC PANEL
ALK PHOS: 65 U/L (ref 38–126)
ALT: 18 U/L (ref 14–54)
AST: 18 U/L (ref 15–41)
Albumin: 3.6 g/dL (ref 3.5–5.0)
Anion gap: 9 (ref 5–15)
BILIRUBIN TOTAL: 0.8 mg/dL (ref 0.3–1.2)
BUN: 11 mg/dL (ref 6–20)
CO2: 21 mmol/L — ABNORMAL LOW (ref 22–32)
CREATININE: 0.73 mg/dL (ref 0.44–1.00)
Calcium: 8.9 mg/dL (ref 8.9–10.3)
Chloride: 109 mmol/L (ref 101–111)
GFR calc Af Amer: >60 mL/min (ref >60)
GLUCOSE: 93 mg/dL (ref 65–99)
POTASSIUM: 3.3 mmol/L — AB (ref 3.5–5.1)
Sodium: 139 mmol/L (ref 135–145)
TOTAL PROTEIN: 5.8 g/dL — AB (ref 6.5–8.1)

## 2017-05-05 LAB — AMMONIA: Ammonia: 20 umol/L (ref 9–35)

## 2017-05-05 LAB — LIPASE, BLOOD: Lipase: 30 U/L (ref 11–51)

## 2017-05-05 MED ORDER — ACETAMINOPHEN 325 MG PO TABS
ORAL_TABLET | ORAL | Status: DC
Start: 2017-05-05 — End: 2017-05-05
  Filled 2017-05-05: qty 2

## 2017-05-05 MED ORDER — DEXTROSE 5 % IV SOLN
1.0000 g | Freq: Once | INTRAVENOUS | Status: AC
  Administered 2017-05-05: 1 g via INTRAVENOUS
  Filled 2017-05-05: qty 10

## 2017-05-05 MED ORDER — ACETAMINOPHEN 500 MG PO TABS
1000.0000 mg | ORAL_TABLET | Freq: Once | ORAL | Status: AC
  Administered 2017-05-05: 1000 mg via ORAL

## 2017-05-05 MED ORDER — CEPHALEXIN 500 MG PO CAPS: 500.0000 mg | ORAL_CAPSULE | Freq: Four times a day (QID) | ORAL | 0 refills | Status: DC

## 2017-05-06 LAB — URINE CULTURE
Culture: >=100000 — AB
Special Requests: NORMAL

## 2017-05-07 ENCOUNTER — Telehealth: Payer: Self-pay

## 2017-05-07 NOTE — Telephone Encounter (Signed)
Post ED Visit - Positive Culture Follow-up  Culture report reviewed by antimicrobial stewardship pharmacist:  []  Elenor Quinones, Pharm.D. []  Heide Guile, Pharm.D., BCPS AQ-ID []  Parks Neptune, Pharm.D., BCPS []  Alycia Rossetti, Pharm.D., BCPS []  [place]ace, Pharm.D., BCPS, AAHIVP []  Legrand Como, Pharm.D., BCPS, AAHIVP []  Salome Arnt, PharmD, BCPS []  Dimitri Ped, PharmD, BCPS []  Vincenza Hews, PharmD, BCPS  Positive urine culture Treated with Cephalexin,  no further patient follow-up is required at this time.  Genia Del 05/07/2017, 11:19 AM

## 2017-05-16 DIAGNOSIS — G309 Alzheimer's disease, unspecified: Secondary | ICD-10-CM | POA: Diagnosis not present

## 2017-05-16 DIAGNOSIS — F0280 Dementia in other diseases classified elsewhere without behavioral disturbance: Secondary | ICD-10-CM | POA: Diagnosis not present

## 2017-05-16 DIAGNOSIS — R399 Unspecified symptoms and signs involving the genitourinary system: Secondary | ICD-10-CM | POA: Diagnosis not present

## 2017-05-23 ENCOUNTER — Ambulatory Visit (INDEPENDENT_AMBULATORY_CARE_PROVIDER_SITE_OTHER): Payer: Medicare Other | Admitting: Family Medicine

## 2017-05-23 ENCOUNTER — Encounter: Payer: Self-pay | Admitting: Family Medicine

## 2017-05-23 VITALS — BP 130/72 | HR 73 | Temp 97.6°F | Ht 64.0 in | Wt 173.6 lb

## 2017-05-23 DIAGNOSIS — F03918 Unspecified dementia, unspecified severity, with other behavioral disturbance: Secondary | ICD-10-CM

## 2017-05-23 DIAGNOSIS — R1900 Intra-abdominal and pelvic swelling, mass and lump, unspecified site: Secondary | ICD-10-CM | POA: Diagnosis not present

## 2017-05-23 DIAGNOSIS — F0391 Unspecified dementia with behavioral disturbance: Secondary | ICD-10-CM

## 2017-05-23 DIAGNOSIS — N390 Urinary tract infection, site not specified: Secondary | ICD-10-CM | POA: Diagnosis not present

## 2017-05-23 LAB — BASIC METABOLIC PANEL
BUN: 11 mg/dL (ref 6–23)
CO2: 24 mEq/L (ref 19–32)
Calcium: 9.8 mg/dL (ref 8.4–10.5)
Chloride: 103 mEq/L (ref 96–112)
Creatinine, Ser: 0.92 mg/dL (ref 0.40–1.20)
GFR: 61.43 mL/min (ref >60.00)
Glucose, Bld: 107 mg/dL — ABNORMAL HIGH (ref 70–99)
Potassium: 4.6 mEq/L (ref 3.5–5.1)
Sodium: 137 mEq/L (ref 135–145)

## 2017-05-23 MED ORDER — NITROFURANTOIN MACROCRYSTAL 50 MG PO CAPS: 50.0000 mg | ORAL_CAPSULE | Freq: Every day | ORAL | 11 refills | Status: DC

## 2017-05-23 NOTE — Progress Notes (Signed)
Valerie Chen is a 82 y.o. female is here for follow up.  History of Present Illness:   HPI: See Assessment and Plan section for Problem Based Charting of issues discussed today.  Health Maintenance Due  Topic Date Due  . TETANUS/TDAP  10/12/1947   Depression screen PHQ 2/9 11/20/2014  Decreased Interest 0  Down, Depressed, Hopeless 0  PHQ - 2 Score 0   PMHx, SurgHx, SocialHx, FamHx, Medications, and Allergies were reviewed in the Visit Navigator and updated as appropriate.   Patient Active Problem List   Diagnosis Date Noted  . Difficulty walking 02/23/2017  . Dementia 01/21/2017  . Abdominal mass 01/21/2017  . NSVT (nonsustained ventricular tachycardia) ([place]) 05/11/2016  . Coarse tremors 05/06/2016  . Syncope 12/29/2015  . Recurrent UTI 02/01/2015  . Essential hypertension 05/30/2013  . Edema   . Psychosis in elderly 03/12/2011  . Coronary artery disease   . Migraine headache   . Diastolic dysfunction   . TIA (transient ischemic attack)   . GI bleed   . Dyslipidemia   . Pain in joints 05/22/2009  . Vitamin D deficiency 01/27/2009  . Anemia 11/21/2008   Social History   Tobacco Use  . Smoking status: Never Smoker  . Smokeless tobacco: Never Used  Substance Use Topics  . Alcohol use: No  . Drug use: Yes    Comment: hx of valium in 1980's - had 2 overdoses per daughter   Current Medications and Allergies:   Current Outpatient Medications:  .  acetaminophen (TYLENOL) 500 MG tablet, Take 1,000 mg by mouth every 6 (six) hours as needed for moderate pain., Disp: , Rfl:  .  Cholecalciferol (VITAMIN D-3) 1000 units CAPS, Take 1,000 Units by mouth daily. , Disp: , Rfl:  .  Cranberry (SM CRANBERRY) 300 MG tablet, Take 300 mg by mouth 3 (three) times daily. , Disp: , Rfl:  .  cyanocobalamin 500 MCG tablet, Take 500 mcg by mouth daily. , Disp: , Rfl:  .  docusate sodium (COLACE) 100 MG capsule, Take 200 mg by mouth daily., Disp: , Rfl:  .  QUEtiapine (SEROQUEL) 25 MG  tablet, Take 1 tablet (25 mg total) by mouth every evening. 6pm, Disp: 90 tablet, Rfl: 2 .  nitrofurantoin (MACRODANTIN) 50 MG capsule, Take 1 capsule (50 mg total) by mouth at bedtime., Disp: 30 capsule, Rfl: 11   Allergies  Allergen Reactions  . Levaquin [Levofloxacin] Other (See Comments)    Reaction:  Altered mental status   . Statins Other (See Comments)    Severe myalgias   Review of Systems   Pertinent items are noted in the HPI. Otherwise, ROS is negative.  Vitals:   Vitals:   05/23/17 1103  BP: 130/72  Pulse: 73  Temp: 97.6 F (36.4 C)  TempSrc: Oral  SpO2: 96%  Weight: 173 lb 9.6 oz (78.7 kg)  Height: 5\' 4"  (1.626 m)     Body mass index is 29.8 kg/m.   Physical Exam:   Physical Exam  Constitutional: She appears well-nourished.  HENT:  Head: Normocephalic and atraumatic.  Eyes: EOM are normal. Pupils are equal, round, and reactive to light.  Neck: Normal range of motion. Neck supple.  Cardiovascular: Normal rate, regular rhythm, normal heart sounds and intact distal pulses.  Pulmonary/Chest: Effort normal.  Abdominal: Soft.  Skin: Skin is warm.  Psychiatric: She has a normal mood and affect. Her behavior is normal.  Nursing note and vitals reviewed.   Results for orders placed or  performed during the hospital encounter of 05/04/17  Urine culture  Result Value Ref Range   Specimen Description URINE, CLEAN CATCH    Special Requests Normal    Culture (A)     >=100,000 COLONIES/mL GROUP B STREP(S.AGALACTIAE)ISOLATED TESTING AGAINST S. AGALACTIAE NOT ROUTINELY PERFORMED DUE TO PREDICTABILITY OF AMP/PEN/VAN SUSCEPTIBILITY. Performed at [facility], [place] [address], [place], [place] [address]    Report Status 05/06/2017 FINAL   CBC with Differential/Platelet  Result Value Ref Range   WBC 4.5 4.0 - 10.5 K/uL   RBC 4.58 3.87 - 5.11 MIL/uL   Hemoglobin 14.4 12.0 - 15.0 g/dL   HCT 41.7 36.0 - 46.0 %   MCV 91.0 78.0 - 100.0 fL   MCH 31.4 26.0 - 34.0  pg   MCHC 34.5 30.0 - 36.0 g/dL   RDW 12.6 11.5 - 15.5 %   Platelets 173 150 - 400 K/uL   Neutrophils Relative % 50 %   Neutro Abs 2.2 1.7 - 7.7 K/uL   Lymphocytes Relative 42 %   Lymphs Abs 1.9 0.7 - 4.0 K/uL   Monocytes Relative 8 %   Monocytes Absolute 0.4 0.1 - 1.0 K/uL   Eosinophils Relative 0 %   Eosinophils Absolute 0.0 0.0 - 0.7 K/uL   Basophils Relative 0 %   Basophils Absolute 0.0 0.0 - 0.1 K/uL  Comprehensive metabolic panel  Result Value Ref Range   Sodium 139 135 - 145 mmol/L   Potassium 3.3 (L) 3.5 - 5.1 mmol/L   Chloride 109 101 - 111 mmol/L   CO2 21 (L) 22 - 32 mmol/L   Glucose, Bld 93 65 - 99 mg/dL   BUN 11 6 - 20 mg/dL   Creatinine, Ser 0.73 0.44 - 1.00 mg/dL   Calcium 8.9 8.9 - 10.3 mg/dL   Total Protein 5.8 (L) 6.5 - 8.1 g/dL   Albumin 3.6 3.5 - 5.0 g/dL   AST 18 15 - 41 U/L   ALT 18 14 - 54 U/L   Alkaline Phosphatase 65 38 - 126 U/L   Total Bilirubin 0.8 0.3 - 1.2 mg/dL   GFR calc non Af Amer >60 >60 mL/min   GFR calc Af Amer >60 >60 mL/min   Anion gap 9 5 - 15  Lipase, blood  Result Value Ref Range   Lipase 30 11 - 51 U/L  Urinalysis, Routine w reflex microscopic  Result Value Ref Range   Color, Urine STRAW (A) YELLOW   APPearance CLEAR CLEAR   Specific Gravity, Urine 1.006 1.005 - 1.030   pH 8.0 5.0 - 8.0   Glucose, UA NEGATIVE NEGATIVE mg/dL   Hgb urine dipstick NEGATIVE NEGATIVE   Bilirubin Urine NEGATIVE NEGATIVE   Ketones, ur NEGATIVE NEGATIVE mg/dL   Protein, ur NEGATIVE NEGATIVE mg/dL   Nitrite NEGATIVE NEGATIVE   Leukocytes, UA LARGE (A) NEGATIVE   RBC / HPF 0-5 0 - 5 RBC/hpf   WBC, UA TOO NUMEROUS TO COUNT 0 - 5 WBC/hpf   Bacteria, UA NONE SEEN NONE SEEN   Squamous Epithelial / LPF 0-5 (A) NONE SEEN  Ammonia  Result Value Ref Range   Ammonia 20 9 - 35 umol/L  I-stat chem 8, ed  Result Value Ref Range   Sodium 140 135 - 145 mmol/L   Potassium 3.3 (L) 3.5 - 5.1 mmol/L   Chloride 109 101 - 111 mmol/L   BUN 9 6 - 20 mg/dL    Creatinine, Ser 0.70 0.44 - 1.00 mg/dL  Glucose, Bld 89 65 - 99 mg/dL   Calcium, Ion 1.02 (L) 1.15 - 1.40 mmol/L   TCO2 21 (L) 22 - 32 mmol/L   Hemoglobin 13.3 12.0 - 15.0 g/dL   HCT 39.0 36.0 - 46.0 %   Assessment and Plan:   Diagnoses and all orders for this visit:  Frequent UTI Comments: Frequent UTI. Family requests preventive medication.  She has had 2 documented UTIs more recently.  Both grew over 100,000 bacteria that was sensitive prescribed medications.  She did finish Bactrim yesterday. Okay daily preventive treatment as below. Orders: -     nitrofurantoin (MACRODANTIN) 50 MG capsule; Take 1 capsule (50 mg total) by mouth at bedtime. -     Basic metabolic panel  Abdominal mass, unspecified abdominal location Comments: Doing well. Followed by Oncology. Next scan in January. Normal BM twice daily.   Psychosis in elderly with behavioral disturbance Comments: Doing very well with lowered dose.   Records requested if needed. Time spent with the patient: 25 minutes, of which >50% was spent in obtaining information about her symptoms, reviewing her previous labs, evaluations, and treatments, counseling her about her condition (please see the discussed topics above), and developing a plan to further investigate it; she had a number of questions which I addressed.   . Reviewed expectations re: course of current medical issues. . Discussed self-management of symptoms. . Outlined signs and symptoms indicating need for more acute intervention. . Patient verbalized understanding and all questions were answered. Marland Kitchen Health Maintenance issues including appropriate healthy diet, exercise, and smoking avoidance were discussed with patient. . See orders for this visit as documented in the electronic medical record. . Patient received an After Visit Summary.  Briscoe Deutscher, DO [place] 05/23/2017  Future Appointments  Date Time Provider [place]  07/05/2017   9:00 AM [facility]  07/05/2017  9:45 AM [facility] None  07/05/2017 10:00 AM [place], Rudell Cobb, MD [facility] None  08/23/2017 10:40 AM Briscoe Deutscher, DO [facility]

## 2017-06-02 ENCOUNTER — Ambulatory Visit (INDEPENDENT_AMBULATORY_CARE_PROVIDER_SITE_OTHER): Payer: Medicare Other | Admitting: Family Medicine

## 2017-06-02 ENCOUNTER — Other Ambulatory Visit: Payer: Self-pay

## 2017-06-02 ENCOUNTER — Emergency Department (HOSPITAL_COMMUNITY)
Admission: EM | Admit: 2017-06-02 | Discharge: 2017-06-03 | Disposition: A | Discharge status: Home / Self Care | Payer: Medicare Other | Attending: Emergency Medicine | Admitting: Emergency Medicine

## 2017-06-02 ENCOUNTER — Encounter: Payer: Self-pay | Admitting: Family Medicine

## 2017-06-02 ENCOUNTER — Encounter (HOSPITAL_COMMUNITY): Payer: Self-pay

## 2017-06-02 ENCOUNTER — Emergency Department (HOSPITAL_COMMUNITY): Payer: Medicare Other

## 2017-06-02 VITALS — BP 140/68 | HR 68 | Temp 97.4°F | Wt 176.2 lb

## 2017-06-02 DIAGNOSIS — H6121 Impacted cerumen, right ear: Secondary | ICD-10-CM | POA: Diagnosis not present

## 2017-06-02 DIAGNOSIS — I10 Essential (primary) hypertension: Secondary | ICD-10-CM | POA: Insufficient documentation

## 2017-06-02 DIAGNOSIS — R079 Chest pain, unspecified: Secondary | ICD-10-CM | POA: Diagnosis not present

## 2017-06-02 DIAGNOSIS — Z7982 Long term (current) use of aspirin: Secondary | ICD-10-CM | POA: Diagnosis not present

## 2017-06-02 DIAGNOSIS — R55 Syncope and collapse: Secondary | ICD-10-CM | POA: Insufficient documentation

## 2017-06-02 DIAGNOSIS — G309 Alzheimer's disease, unspecified: Secondary | ICD-10-CM | POA: Insufficient documentation

## 2017-06-02 DIAGNOSIS — R0602 Shortness of breath: Secondary | ICD-10-CM | POA: Insufficient documentation

## 2017-06-02 DIAGNOSIS — J9811 Atelectasis: Secondary | ICD-10-CM | POA: Diagnosis not present

## 2017-06-02 DIAGNOSIS — R300 Dysuria: Secondary | ICD-10-CM | POA: Diagnosis not present

## 2017-06-02 DIAGNOSIS — R7989 Other specified abnormal findings of blood chemistry: Secondary | ICD-10-CM | POA: Diagnosis not present

## 2017-06-02 DIAGNOSIS — Z79899 Other long term (current) drug therapy: Secondary | ICD-10-CM | POA: Diagnosis not present

## 2017-06-02 DIAGNOSIS — R233 Spontaneous ecchymoses: Secondary | ICD-10-CM | POA: Diagnosis not present

## 2017-06-02 DIAGNOSIS — I2510 Atherosclerotic heart disease of native coronary artery without angina pectoris: Secondary | ICD-10-CM | POA: Insufficient documentation

## 2017-06-02 LAB — URINALYSIS, ROUTINE W REFLEX MICROSCOPIC
Bilirubin Urine: NEGATIVE
GLUCOSE, UA: NEGATIVE mg/dL
Hgb urine dipstick: NEGATIVE
Ketones, ur: NEGATIVE mg/dL
LEUKOCYTES UA: NEGATIVE
Nitrite: NEGATIVE
Protein, ur: NEGATIVE mg/dL
Specific Gravity, Urine: 1.005 (ref 1.005–1.030)
pH: 8 (ref 5.0–8.0)

## 2017-06-02 LAB — CBC WITH DIFFERENTIAL/PLATELET
Basophils Absolute: 0 10*3/uL (ref 0.0–0.1)
Basophils Absolute: 0 10*3/uL (ref 0.0–0.1)
Basophils Relative: 0.5 % (ref 0.0–3.0)
Basophils Relative: 0 %
EOS ABS: 0 10*3/uL (ref 0.0–0.7)
Eosinophils Absolute: 0 10*3/uL (ref 0.0–0.7)
Eosinophils Relative: 0 % (ref 0.0–5.0)
Eosinophils Relative: 0 %
HCT: 45.6 % (ref 36.0–46.0)
HEMATOCRIT: 41.3 % (ref 36.0–46.0)
HEMOGLOBIN: 14.4 g/dL (ref 12.0–15.0)
Hemoglobin: 15.2 g/dL — ABNORMAL HIGH (ref 12.0–15.0)
LYMPHS ABS: 1.8 10*3/uL (ref 0.7–4.0)
LYMPHS PCT: 36 %
Lymphocytes Relative: 23.4 % (ref 12.0–46.0)
Lymphs Abs: 1.3 10*3/uL (ref 0.7–4.0)
MCH: 31.5 pg (ref 26.0–34.0)
MCHC: 33.4 g/dL (ref 30.0–36.0)
MCHC: 34.9 g/dL (ref 30.0–36.0)
MCV: 92.5 fl (ref 78.0–100.0)
MCV: 90.4 fL (ref 78.0–100.0)
MONOS PCT: 6 %
Monocytes Absolute: 0.5 10*3/uL (ref 0.1–1.0)
Monocytes Absolute: 0.3 10*3/uL (ref 0.1–1.0)
Monocytes Relative: 7.9 % (ref 3.0–12.0)
NEUTROS ABS: 3 10*3/uL (ref 1.7–7.7)
NEUTROS PCT: 58 %
Neutro Abs: 3.9 10*3/uL (ref 1.4–7.7)
Neutrophils Relative %: 68.2 % (ref 43.0–77.0)
Platelets: 188 10*3/uL (ref 150.0–400.0)
Platelets: 160 10*3/uL (ref 150–400)
RBC: 4.93 Mil/uL (ref 3.87–5.11)
RBC: 4.57 MIL/uL (ref 3.87–5.11)
RDW: 13 % (ref 11.5–15.5)
RDW: 12.6 % (ref 11.5–15.5)
WBC: 5.7 10*3/uL (ref 4.0–10.5)
WBC: 5.1 10*3/uL (ref 4.0–10.5)

## 2017-06-02 LAB — POC URINALSYSI DIPSTICK (AUTOMATED)
Bilirubin, UA: NEGATIVE
Blood, UA: NEGATIVE
Glucose, UA: NEGATIVE
Ketones, UA: NEGATIVE
Leukocytes, UA: NEGATIVE
Nitrite, UA: NEGATIVE
Protein, UA: NEGATIVE
Spec Grav, UA: 1.01 (ref 1.010–1.025)
Urobilinogen, UA: 0.2 E.U./dL
pH, UA: 6 (ref 5.0–8.0)

## 2017-06-02 LAB — COMPREHENSIVE METABOLIC PANEL
ALK PHOS: 70 U/L (ref 38–126)
ALT: 12 U/L (ref 0–35)
ALT: 14 U/L (ref 14–54)
AST: 13 U/L (ref 0–37)
AST: 17 U/L (ref 15–41)
Albumin: 3.9 g/dL (ref 3.5–5.2)
Albumin: 3.4 g/dL — ABNORMAL LOW (ref 3.5–5.0)
Alkaline Phosphatase: 71 U/L (ref 39–117)
Anion gap: 9 (ref 5–15)
BILIRUBIN TOTAL: 0.8 mg/dL (ref 0.3–1.2)
BUN: 11 mg/dL (ref 6–23)
BUN: 10 mg/dL (ref 6–20)
CALCIUM: 9 mg/dL (ref 8.9–10.3)
CO2: 28 mEq/L (ref 19–32)
CO2: 24 mmol/L (ref 22–32)
CREATININE: 0.77 mg/dL (ref 0.44–1.00)
Calcium: 9.1 mg/dL (ref 8.4–10.5)
Chloride: 103 mEq/L (ref 96–112)
Chloride: 106 mmol/L (ref 101–111)
Creatinine, Ser: 0.87 mg/dL (ref 0.40–1.20)
GFR calc non Af Amer: >60 mL/min (ref >60)
GFR: 65.52 mL/min (ref >60.00)
Glucose, Bld: 85 mg/dL (ref 70–99)
Glucose, Bld: 89 mg/dL (ref 65–99)
Potassium: 4.2 mEq/L (ref 3.5–5.1)
Potassium: 3.5 mmol/L (ref 3.5–5.1)
SODIUM: 139 mmol/L (ref 135–145)
Sodium: 137 mEq/L (ref 135–145)
TOTAL PROTEIN: 5.6 g/dL — AB (ref 6.5–8.1)
Total Bilirubin: 0.6 mg/dL (ref 0.2–1.2)
Total Protein: 6.6 g/dL (ref 6.0–8.3)

## 2017-06-02 LAB — D-DIMER, QUANTITATIVE: D-Dimer, Quant: 1.11 ug/mL-FEU — ABNORMAL HIGH (ref 0.00–0.50)

## 2017-06-02 LAB — URINALYSIS, MICROSCOPIC ONLY

## 2017-06-02 LAB — I-STAT TROPONIN, ED: Troponin i, poc: 0.01 ng/mL (ref 0.00–0.08)

## 2017-06-02 LAB — BRAIN NATRIURETIC PEPTIDE: B Natriuretic Peptide: 46.5 pg/mL (ref 0.0–100.0)

## 2017-06-02 MED ORDER — CEFTRIAXONE SODIUM 1 G IJ SOLR
1.0000 g | Freq: Once | INTRAMUSCULAR | Status: AC
  Administered 2017-06-02: 1 g via INTRAMUSCULAR

## 2017-06-02 MED ORDER — NITROFURANTOIN MONOHYD MACRO 100 MG PO CAPS: 100.0000 mg | ORAL_CAPSULE | Freq: Two times a day (BID) | ORAL | 0 refills | Status: DC

## 2017-06-02 MED ORDER — IOPAMIDOL (ISOVUE-370) INJECTION 76%
INTRAVENOUS | Status: AC
  Administered 2017-06-03: 100 mL
  Filled 2017-06-02: qty 100

## 2017-06-02 NOTE — ED Provider Notes (Signed)
[facility] EMERGENCY DEPARTMENT Provider Note   CSN: [id number] Arrival date & time: 06/02/17  2031     History   Chief Complaint Chief Complaint  Patient presents with  . Near Syncope    HPI Valerie Chen is a 83 y.o. female.  HPI 83 year old female with a history of Alzheimer's dementia, CAD, CHF, HLD, HTN, recurrent UTIs on chronic Macrobid presenting with a near syncopal episode.  Patient lives at home with her daughter and has 24-hour nursing care by CNA.  Patient got up from bed and walked to the bathroom back and forth twice and then while she was in the bathroom daughter noted that she complained of chest pain and looked like she was short of breath and the daughter thought that she might pass out.  She was assisted to the bed and her symptoms improved. Before this event, she was noted to have a rash on her legs and was taken to her PCP and given a shot of rocephin for the rash. Pt a very poor historian and denies chest pain, SOB, HA, neck pain, abd pain, N/V/D at this time. Daughter states that she sometimes has panic attacks and typically present with CP and SOB.   Past Medical History:  Diagnosis Date  . Alzheimer disease   . Arthritis   . CAD (coronary artery disease)    Probable CAD, nuclear February, 2012, possible anteroseptal and inferoseptal ischemia but the study was technically difficult  . Chest pain    Hospital February, 2012, nuclear, possible anteroseptal and inferoseptal ischemia, technically difficult, medical therapy  . Dementia    Significant  . Diastolic dysfunction    Echo, February, 2012  . Diastolic dysfunction   . DNR (do not resuscitate)   . Dyslipidemia   . Edema    hospitalizations September, 2013, some fluid overload  . Ejection fraction    EF 55%, echo, February, 2012  //   EF 55-60%, echo, February 25, 2012 // Echo 12/17: EF 60-65, no RWMA, Gr 1 DD, normal RVSF  . Essential hypertension   . GI bleed    February, 2012  incomplete colonoscopy, hemorrhoids, needs complete colonoscopy  . Hx: UTI (urinary tract infection)   . Hyperlipidemia   . Leg pain, bilateral   . Major depression    2 overdoses in 1975 & 1977  . Migraines    25 year  . Orthostatic hypotension   . Suicide attempt ([place]) 1985   2 attempts with Valium  . TIA (transient ischemic attack)       ???question of a TIA in February, 2012, carotid Doppler, August 11, 2010 no significant abnormalities    Patient Active Problem List   Diagnosis Date Noted  . Difficulty walking 02/23/2017  . Dementia 01/21/2017  . Abdominal mass 01/21/2017  . NSVT (nonsustained ventricular tachycardia) ([place]) 05/11/2016  . Coarse tremors 05/06/2016  . Syncope 12/29/2015  . Recurrent UTI 02/01/2015  . Essential hypertension 05/30/2013  . Edema   . Psychosis in elderly 03/12/2011  . Coronary artery disease   . Migraine headache   . Diastolic dysfunction   . TIA (transient ischemic attack)   . GI bleed   . Dyslipidemia   . Pain in joints 05/22/2009  . Vitamin D deficiency 01/27/2009  . Anemia 11/21/2008    Past Surgical History:  Procedure Laterality Date  . ABDOMINAL HYSTERECTOMY    . DIAGNOSTIC MAMMOGRAM  12/29/2000   Mammogram, suspicious lesions -BX pending  . TONSILLECTOMY  OB History    No data available       Home Medications    Prior to Admission medications   Medication Sig Start Date End Date Taking? Authorizing Provider  acetaminophen (TYLENOL) 500 MG tablet Take 1,000 mg by mouth every 6 (six) hours as needed for moderate pain.   Yes [provider]  aspirin EC 81 MG tablet Take 81 mg by mouth daily.   Yes [provider]  Cholecalciferol (VITAMIN D-3) 1000 units CAPS Take 1,000 Units by mouth daily.    Yes [provider]  Cranberry (SM CRANBERRY) 300 MG tablet Take 300 mg by mouth 3 (three) times daily.    Yes [provider]  cyanocobalamin 500 MCG tablet Take 500 mcg by mouth daily.     Yes [provider]  docusate sodium (COLACE) 100 MG capsule Take 100 mg by mouth 3 (three) times daily.    Yes [provider]  nitrofurantoin (MACRODANTIN) 50 MG capsule Take 1 capsule (50 mg total) by mouth at bedtime. 05/23/17  Yes Briscoe Deutscher, DO  QUEtiapine (SEROQUEL) 25 MG tablet Take 1 tablet (25 mg total) by mouth every evening. 6pm Patient taking differently: Take 12.5 mg by mouth daily at 6 PM.  12/06/16  Yes Briscoe Deutscher, DO  nitrofurantoin, macrocrystal-monohydrate, (MACROBID) 100 MG capsule Take 1 capsule (100 mg total) by mouth 2 (two) times daily. 06/02/17   Briscoe Deutscher, DO    Family History Family History  Problem Relation Age of Onset  . Heart attack Father        several heart attacks  . Cancer Father        bone  . Arthritis Father   . Suicidality Mother   . Depression Mother   . Heart attack Brother   . Heart disease Brother   . Pneumonia Brother   . Suicidality Son   . Hypertension Daughter   . Depression Other        Most family members    Social History Social History   Tobacco Use  . Smoking status: Never Smoker  . Smokeless tobacco: Never Used  Substance Use Topics  . Alcohol use: No  . Drug use: Yes    Comment: hx of valium in 1980's - had 2 overdoses per daughter     Allergies   Levaquin [levofloxacin]; Rocephin [ceftriaxone]; and Statins   Review of Systems Review of Systems  Unable to perform ROS: Dementia     Physical Exam Updated Vital Signs BP 103/82   Pulse (!) 58   Temp 98.6 F (37 C) (Oral)   Resp 18   SpO2 99%   Physical Exam  Constitutional: She appears well-developed and well-nourished. No distress.  HENT:  Head: Normocephalic and atraumatic.  Eyes: Conjunctivae and EOM are normal. Pupils are equal, round, and reactive to light.  Neck: Normal range of motion. Neck supple. No neck rigidity.  Cardiovascular: Normal rate, regular rhythm, normal heart sounds, intact distal pulses and normal  pulses.  No murmur heard. Pulmonary/Chest: Effort normal and breath sounds normal. No tachypnea. No respiratory distress. She has no decreased breath sounds.  Abdominal: Soft. There is no tenderness.  Musculoskeletal: She exhibits no edema.  Neurological: She is alert. She is disoriented. GCS eye subscore is 4. GCS verbal subscore is 5. GCS motor subscore is 6.  CN 2-12 grossly intact. Sensation intact throughout. Moving all extremities to command with no focal deficit.  Skin: Skin is warm and dry. Petechiae (noted to  b/l LE below the knees.) noted.  Psychiatric: She has a normal mood and affect.  Nursing note and vitals reviewed.    ED Treatments / Results  Labs (all labs ordered are listed, but only abnormal results are displayed) Labs Reviewed  COMPREHENSIVE METABOLIC PANEL - Abnormal; Notable for the following components:      Result Value   Total Protein 5.6 (*)    Albumin 3.4 (*)    All other components within normal limits  URINALYSIS, ROUTINE W REFLEX MICROSCOPIC - Abnormal; Notable for the following components:   Color, Urine STRAW (*)    All other components within normal limits  D-DIMER, QUANTITATIVE (NOT AT [facility]) - Abnormal; Notable for the following components:   D-Dimer, Quant 1.11 (*)    All other components within normal limits  URINE CULTURE  CBC WITH DIFFERENTIAL/PLATELET  BRAIN NATRIURETIC PEPTIDE  I-STAT TROPONIN, ED  I-STAT TROPONIN, ED    EKG  EKG Interpretation  Date/Time:  Thursday June 02 2017 20:46:24 EST Ventricular Rate:  58 PR Interval:    QRS Duration: 91 QT Interval:  445 QTC Calculation: 438 R Axis:   74 Text Interpretation:  Sinus rhythm Low voltage, precordial leads No significant change since last tracing Confirmed by Gareth Morgan [phone]) on 06/02/2017 9:03:21 PM       Radiology Dg Chest 2 View  Result Date: 06/02/2017 CLINICAL DATA:  Lightheadedness.  Presyncope. EXAM: CHEST  2 VIEW COMPARISON:  Chest radiograph  05/04/2017 FINDINGS: Shallow lung inflation with bilateral basilar predominant opacities, likely atelectasis. Mild pulmonary vascular congestion. No overt edema. No pneumothorax or pleural effusion. IMPRESSION: Bibasilar atelectasis and pulmonary vascular congestion without overt pulmonary edema. Electronically Signed   By: Ulyses Jarred M.D.   On: 06/02/2017 22:58    Procedures Procedures (including critical care time)  Medications Ordered in ED Medications  iopamidol (ISOVUE-370) 76 % injection (100 mLs  Contrast Given 06/03/17 0006)     Initial Impression / Assessment and Plan / ED Course  I have reviewed the triage vital signs and the nursing notes.  Pertinent labs & imaging results that were available during my care of the patient were reviewed by me and considered in my medical decision making (see chart for details).     83 year old female with the above history presenting after an episode of presyncope associated with chest pain and tachypnea that resolved after rest.  Denies any chest pain, shortness of breath, lightheadedness at this time.  Otherwise well-appearing.  Afebrile and hemodynamic stable.  Given aspirin and nitro by EMS.  On chronic Macrobid for UTIs and was given Rocephin today for a rash on her legs.  Patient noted to have small petechiae to her bilateral lower extremities below the knees with no subtendinous emphysema, bullae or purpura.  Afebrile, no signs of meningismus.  Rashes do not appear infectious and low concern for SJS/TEN, nec fasc, or tick borne illness.  EKG with no acute ischemic changes similar to prior. CXR with pulmonary vascular congestion with no obvious pulmonary edema.  Will order troponin to rule out ACS and BNP to ensure she is not in heart failure.  D-dimer ordered to rule out PE.  UA, CBC, CMP ordered.  UA negative for signs of infection.  Troponin negative.  CBC and CMP unremarkable.  D-dimer elevated to 1100.  Will order CT PE to rule out  PE. Patient has a nonfocal neuro exam and symptoms resolved therefore low suspicion for CVA.  Will order 3-hour troponin to rule  out ACS.  If CT PE shows PE, will need treatment however if both are negative, patient can follow-up with PCP for reevaluation.  Daughter is amenable with this plan as she is her primary caretaker and lives with her.  Final Clinical Impressions(s) / ED Diagnoses   Final diagnoses:  Near syncope    ED Discharge Orders    None       Alison Breeding [place], MD 06/03/17 1062    Gareth Morgan, MD 06/05/17 [id number]

## 2017-06-02 NOTE — ED Triage Notes (Signed)
Pt coming from home by [facility]. Pt was ambulating from bathroom felt like she was going to pass out. Pt did not fall, no LOC. Pt also c/o chest pain mid chest. Pt states it hurts a lot. Ems gave 324 asa and 1 nitro. Pt states that chest pain hurts a little now. Pt is prone to uti's  Pt given preventative antibiotics at home. Pt was at pcp and was given rocephin for rash on her legs per family.

## 2017-06-02 NOTE — Progress Notes (Signed)
Valerie Chen is a 83 y.o. female here for an acute visit. Her caregiver is with her.   History of Present Illness:   HPI:   1. Dysuria: Genito-Urinary ROS: positive for - incontinence and urinary frequency/urgency AND negative for - hematuria or pelvic pain. Frequent UTIs. Low dose Macrobid started a few weeks ago. Increase in confusion.   2. Rash: Lower legs. Caregiver was out of town over the weekend. Not sure if patient was wearing her compression hose.    3. Impacted cerumen of right ear: Hx of the same and wants checked.   PMHx, SurgHx, SocialHx, Medications, and Allergies were reviewed in the Visit Navigator and updated as appropriate.  Current Medications:   .  acetaminophen (TYLENOL) 500 MG tablet, Take 1,000 mg by mouth every 6 (six) hours as needed for moderate pain., Disp: , Rfl:  .  Cholecalciferol (VITAMIN D-3) 1000 units CAPS, Take 1,000 Units by mouth daily. , Disp: , Rfl:  .  Cranberry (SM CRANBERRY) 300 MG tablet, Take 300 mg by mouth 3 (three) times daily. , Disp: , Rfl:  .  cyanocobalamin 500 MCG tablet, Take 500 mcg by mouth daily. , Disp: , Rfl:  .  docusate sodium (COLACE) 100 MG capsule, Take 200 mg by mouth daily., Disp: , Rfl:  .  nitrofurantoin (MACRODANTIN) 50 MG capsule, Take 1 capsule (50 mg total) by mouth at bedtime., Disp: 30 capsule, Rfl: 11 .  QUEtiapine (SEROQUEL) 25 MG tablet, Take 1 tablet (25 mg total) by mouth every evening. 6pm, Disp: 90 tablet, Rfl: 2   Allergies  Allergen Reactions  . Levaquin [Levofloxacin] Other (See Comments)    Altered mental status   . Rocephin [Ceftriaxone] Other (See Comments)    Uncontrollable chills/shaking, dizziness, and disorientation after receiving this (??)  . Statins Other (See Comments)    Severe myalgias   Review of Systems:   Pertinent items are noted in the HPI. Otherwise, ROS is negative.  Vitals:   Vitals:   06/02/17 1400  BP: 140/68  Pulse: 68  Temp: (!) 97.4 F (36.3 C)  TempSrc: Oral   SpO2: 97%  Weight: 176 lb 3.2 oz (79.9 kg)     Body mass index is 30.24 kg/m.   Physical Exam:   Physical Exam  Constitutional: She appears well-nourished.  HENT:  Head: Normocephalic and atraumatic.  Right Ear: Tympanic membrane normal.  Left cerumen impaction.  Eyes: EOM are normal. Pupils are equal, round, and reactive to light.  Neck: Normal range of motion. Neck supple.  Cardiovascular: Normal rate, regular rhythm, normal heart sounds and intact distal pulses.  Pulmonary/Chest: Effort normal.  Abdominal: Soft. Bowel sounds are normal. She exhibits mass.  Skin: Skin is warm.  Psychiatric: She has a normal mood and affect. Her behavior is normal.  Nursing note and vitals reviewed.   Results for orders placed or performed in visit on 06/02/17  Urine Culture  Result Value Ref Range   MICRO NUMBER: [id number]    SPECIMEN QUALITY: ADEQUATE    Sample Source URINE    STATUS: FINAL    Result:      Multiple organisms present, each less than 10,000 CFU/mL. These organisms, commonly found on external and internal genitalia, are considered to be colonizers. No further testing performed.  CBC with Differential/Platelet  Result Value Ref Range   WBC 5.7 4.0 - 10.5 K/uL   RBC 4.93 3.87 - 5.11 Mil/uL   Hemoglobin 15.2 (H) 12.0 - 15.0 g/dL   HCT  45.6 36.0 - 46.0 %   MCV 92.5 78.0 - 100.0 fl   MCHC 33.4 30.0 - 36.0 g/dL   RDW 13.0 11.5 - 15.5 %   Platelets 188.0 150.0 - 400.0 K/uL   Neutrophils Relative % 68.2 43.0 - 77.0 %   Lymphocytes Relative 23.4 12.0 - 46.0 %   Monocytes Relative 7.9 3.0 - 12.0 %   Eosinophils Relative 0.0 0.0 - 5.0 %   Basophils Relative 0.5 0.0 - 3.0 %   Neutro Abs 3.9 1.4 - 7.7 K/uL   Lymphs Abs 1.3 0.7 - 4.0 K/uL   Monocytes Absolute 0.5 0.1 - 1.0 K/uL   Eosinophils Absolute 0.0 0.0 - 0.7 K/uL   Basophils Absolute 0.0 0.0 - 0.1 K/uL  Comprehensive metabolic panel  Result Value Ref Range   Sodium 137 135 - 145 mEq/L   Potassium 4.2 3.5 - 5.1 mEq/L     Chloride 103 96 - 112 mEq/L   CO2 28 19 - 32 mEq/L   Glucose, Bld 85 70 - 99 mg/dL   BUN 11 6 - 23 mg/dL   Creatinine, Ser 0.87 0.40 - 1.20 mg/dL   Total Bilirubin 0.6 0.2 - 1.2 mg/dL   Alkaline Phosphatase 71 39 - 117 U/L   AST 13 0 - 37 U/L   ALT 12 0 - 35 U/L   Total Protein 6.6 6.0 - 8.3 g/dL   Albumin 3.9 3.5 - 5.2 g/dL   Calcium 9.1 8.4 - 10.5 mg/dL   GFR 65.52 >60.00 mL/min  Urine Microscopic Only  Result Value Ref Range   WBC, UA 0-2/hpf 0-2/hpf   RBC / HPF 0-2/hpf 0-2/hpf   Squamous Epithelial / LPF Rare(0-4/hpf) Rare(0-4/hpf)  POCT Urinalysis Dipstick (Automated)  Result Value Ref Range   Color, UA Yellow    Clarity, UA Clear    Glucose, UA Negative    Bilirubin, UA Negative    Ketones, UA Negative    Spec Grav, UA 1.010 1.010 - 1.025   Blood, UA Negative    pH, UA 6.0 5.0 - 8.0   Protein, UA Negative    Urobilinogen, UA 0.2 0.2 or 1.0 E.U./dL   Nitrite, UA Negative    Leukocytes, UA Negative Negative   Assessment and Plan:   Jamileth was seen today for rash.  Diagnoses and all orders for this visit:  Dysuria Comments: Hx of frequent UTI, most recently with resulting sepsis. POC UA in office negative, but has been in the past with positive culture. She is also on low dose Macrobid. Will treat today based on usual symptoms of UTI for patient. Red flags reviewed. UCx ordered.  Orders: -     POCT Urinalysis Dipstick (Automated) -     Urine Culture -     CBC with Differential/Platelet -     Comprehensive metabolic panel -     Urine Microscopic Only -     cefTRIAXone (ROCEPHIN) injection 1 g -     nitrofurantoin, macrocrystal-monohydrate, (MACROBID) 100 MG capsule; Take 1 capsule (100 mg total) by mouth 2 (two) times daily.  Petechial rash Comments: Unclear etiology. Counseled compression stockings. Will check CBC.   Impacted cerumen of right ear Comments: Lavage in usual manner without complications and with relief of symptoms.   . Reviewed  expectations re: course of current medical issues. . Discussed self-management of symptoms. . Outlined signs and symptoms indicating need for more acute intervention. . Patient verbalized understanding and all questions were answered. Marland Kitchen Health Maintenance issues including  appropriate healthy diet, exercise, and smoking avoidance were discussed with patient. . See orders for this visit as documented in the electronic medical record. . Patient received an After Visit Summary.  Briscoe Deutscher, DO [place] [place] 06/05/2017  Future Appointments  Date Time Provider [place]  07/05/2017  9:00 AM [facility]  07/05/2017  9:45 AM [facility] LAB [facility] None  07/05/2017 10:00 AM [place], Rudell Cobb, MD [facility] None  08/23/2017 10:40 AM Briscoe Deutscher, DO [facility]

## 2017-06-03 ENCOUNTER — Emergency Department (HOSPITAL_COMMUNITY): Payer: Medicare Other

## 2017-06-03 DIAGNOSIS — R55 Syncope and collapse: Secondary | ICD-10-CM | POA: Diagnosis not present

## 2017-06-03 DIAGNOSIS — R7989 Other specified abnormal findings of blood chemistry: Secondary | ICD-10-CM | POA: Diagnosis not present

## 2017-06-03 LAB — URINE CULTURE
MICRO NUMBER:: 81402817
SPECIMEN QUALITY:: ADEQUATE

## 2017-06-03 LAB — I-STAT TROPONIN, ED: Troponin i, poc: 0.01 ng/mL (ref 0.00–0.08)

## 2017-06-04 LAB — URINE CULTURE: Culture: NO GROWTH

## 2017-06-09 DIAGNOSIS — R0682 Tachypnea, not elsewhere classified: Secondary | ICD-10-CM | POA: Diagnosis not present

## 2017-06-09 DIAGNOSIS — F419 Anxiety disorder, unspecified: Secondary | ICD-10-CM | POA: Diagnosis not present

## 2017-07-05 ENCOUNTER — Inpatient Hospital Stay: Payer: Medicare Other

## 2017-07-05 ENCOUNTER — Other Ambulatory Visit: Payer: Self-pay

## 2017-07-05 ENCOUNTER — Encounter (HOSPITAL_BASED_OUTPATIENT_CLINIC_OR_DEPARTMENT_OTHER): Payer: Self-pay

## 2017-07-05 ENCOUNTER — Ambulatory Visit (HOSPITAL_BASED_OUTPATIENT_CLINIC_OR_DEPARTMENT_OTHER)
Admission: RE | Admit: 2017-07-05 | Discharge: 2017-07-05 | Disposition: A | Discharge status: Home / Self Care | Payer: Medicare Other | Source: Ambulatory Visit | Attending: Hematology & Oncology | Admitting: Hematology & Oncology

## 2017-07-05 ENCOUNTER — Inpatient Hospital Stay: Payer: Medicare Other | Attending: Hematology & Oncology | Admitting: Hematology & Oncology

## 2017-07-05 DIAGNOSIS — R1900 Intra-abdominal and pelvic swelling, mass and lump, unspecified site: Secondary | ICD-10-CM | POA: Insufficient documentation

## 2017-07-05 DIAGNOSIS — R1909 Other intra-abdominal and pelvic swelling, mass and lump: Secondary | ICD-10-CM | POA: Diagnosis not present

## 2017-07-05 DIAGNOSIS — Z23 Encounter for immunization: Secondary | ICD-10-CM

## 2017-07-05 DIAGNOSIS — F0390 Unspecified dementia without behavioral disturbance: Secondary | ICD-10-CM | POA: Insufficient documentation

## 2017-07-05 DIAGNOSIS — R1907 Generalized intra-abdominal and pelvic swelling, mass and lump: Secondary | ICD-10-CM

## 2017-07-05 LAB — CMP (CANCER CENTER ONLY)
ALBUMIN: 3.9 g/dL (ref 3.5–5.0)
ALT: 22 U/L (ref 0–55)
AST: 19 U/L (ref 5–34)
Alkaline Phosphatase: 76 U/L (ref 40–150)
Anion gap: 9 (ref 3–11)
BILIRUBIN TOTAL: 0.6 mg/dL (ref 0.2–1.2)
BUN: 7 mg/dL (ref 7–26)
CO2: 27 mmol/L (ref 22–29)
CREATININE: 0.8 mg/dL (ref 0.60–1.10)
Calcium: 9.6 mg/dL (ref 8.4–10.4)
Chloride: 105 mmol/L (ref 98–109)
GFR, Est AFR Am: >60 mL/min (ref >60)
GFR, Estimated: >60 mL/min (ref >60)
GLUCOSE: 86 mg/dL (ref 70–140)
Potassium: 4.6 mmol/L (ref 3.3–4.7)
Sodium: 141 mmol/L (ref 136–145)
TOTAL PROTEIN: 6.8 g/dL (ref 6.4–8.3)

## 2017-07-05 LAB — CBC WITH DIFFERENTIAL (CANCER CENTER ONLY)
BASOS ABS: 0 10*3/uL (ref 0.0–0.1)
BASOS PCT: 0 %
Eosinophils Absolute: 0 10*3/uL (ref 0.0–0.5)
Eosinophils Relative: 0 %
HEMATOCRIT: 45.5 % (ref 34.8–46.6)
Hemoglobin: 15.7 g/dL (ref 11.6–15.9)
LYMPHS PCT: 28 %
Lymphs Abs: 1.6 10*3/uL (ref 0.9–3.3)
MCH: 31.5 pg (ref 26.0–34.0)
MCHC: 34.5 g/dL (ref 32.0–36.0)
MCV: 91.2 fL (ref 81.0–101.0)
MONOS PCT: 8 %
Monocytes Absolute: 0.5 10*3/uL (ref 0.1–0.9)
NEUTROS ABS: 3.7 10*3/uL (ref 1.5–6.5)
NEUTROS PCT: 64 %
Platelet Count: 162 10*3/uL (ref 145–400)
RBC: 4.99 MIL/uL (ref 3.70–5.32)
RDW: 12.9 % (ref 11.1–15.7)
WBC Count: 5.8 10*3/uL (ref 3.9–10.3)

## 2017-07-05 LAB — LACTATE DEHYDROGENASE: LDH: 184 U/L (ref 125–245)

## 2017-07-05 MED ORDER — IOPAMIDOL (ISOVUE-300) INJECTION 61%
100.0000 mL | Freq: Once | INTRAVENOUS | Status: AC | PRN
  Administered 2017-07-05: 100 mL via INTRAVENOUS

## 2017-07-05 NOTE — Progress Notes (Signed)
Hematology and Oncology Follow Up Visit  Valerie Chen [id number] 09/26/1928 84 y.o. 07/05/2017   Principle Diagnosis:   Left mesenteric mass  Current Therapy:    Observation     Interim History:  Valerie Chen is in for her follow-up. She comes in with her caretaker. Her daughter is on the cell phone listening.  We did do a CT scan on Valerie Chen today.  It seems like the mesenteric mass is relatively stable in size.  The CT scan showed that the mesenteric mass measured 6.8 x 5.6 x 4 cm.  I am not sure what the radiologist means by "mesenteric mass has shifted position".  The mass is intimately related to the proximal small bowel.  Otherwise, everything else looks fine..  She has had no nausea or vomiting.  She has had no abdominal pain.  There is been no change in bowel or bladder habits.  She has had no bleeding.  She has had no fever.  She enjoyed the holiday season.  Overall, her performance status is ECOG 2.   Medications:  Current Outpatient Medications:  .  acetaminophen (TYLENOL) 500 MG tablet, Take 1,000 mg by mouth every 6 (six) hours as needed for moderate pain., Disp: , Rfl:  .  Cholecalciferol (VITAMIN D-3) 1000 units CAPS, Take 1,000 Units by mouth daily. , Disp: , Rfl:  .  cyanocobalamin 500 MCG tablet, Take 500 mcg by mouth daily. , Disp: , Rfl:  .  docusate sodium (COLACE) 100 MG capsule, Take 100 mg by mouth daily. , Disp: , Rfl:  .  Melatonin 1 MG/ML LIQD, Take 5 mLs by mouth. Daily at 6:00PM, Disp: , Rfl:  .  UNABLE TO FIND, Fern dophilus one capsule daily at 9:00AM, Disp: , Rfl:  .  UNABLE TO FIND, Theanine 100 mg one capsule daily at 3:00PM, Disp: , Rfl:  .  UNABLE TO FIND, Theanine 200 mg one capsule daily at 8:00AM, Disp: , Rfl:  .  UNABLE TO FIND, Maxi one multivitamin one daily at 9:00AM, Disp: , Rfl:   Allergies:  Allergies  Allergen Reactions  . Levaquin [Levofloxacin] Other (See Comments)    Altered mental status   . Rocephin [Ceftriaxone] Other  (See Comments)    Uncontrollable chills/shaking, dizziness, and disorientation after receiving this (??)  . Statins Other (See Comments)    Severe myalgias    Past Medical History, Surgical history, Social history, and Family History were reviewed and updated.  Review of Systems: Review of Systems  Constitutional: Negative for appetite change, fatigue, fever and unexpected weight change.  HENT:   Negative for lump/mass, mouth sores, sore throat and trouble swallowing.   Respiratory: Negative for cough, hemoptysis and shortness of breath.   Cardiovascular: Negative for leg swelling and palpitations.  Gastrointestinal: Negative for abdominal distention, abdominal pain, blood in stool, constipation, diarrhea, nausea and vomiting.  Genitourinary: Negative for bladder incontinence, dysuria, frequency and hematuria.   Musculoskeletal: Negative for arthralgias, back pain, gait problem and myalgias.  Skin: Negative for itching and rash.  Neurological: Negative for dizziness, extremity weakness, gait problem, headaches, numbness, seizures and speech difficulty.  Hematological: Does not bruise/bleed easily.  Psychiatric/Behavioral: Negative for depression and sleep disturbance. The patient is not nervous/anxious.     Physical Exam:  weight is 170 lb (77.1 kg). Her oral temperature is 98.1 F (36.7 C). Her blood pressure is 148/72 (abnormal) and her pulse is 72. Her respiration is 20 and oxygen saturation is 98%.   Wt Readings from  Last 3 Encounters:  07/05/17 170 lb (77.1 kg)  06/02/17 176 lb 3.2 oz (79.9 kg)  05/23/17 173 lb 9.6 oz (78.7 kg)    Physical Exam  Constitutional: She is oriented to person, place, and time.  HENT:  Head: Normocephalic and atraumatic.  Mouth/Throat: Oropharynx is clear and moist.  Eyes: EOM are normal. Pupils are equal, round, and reactive to light.  Neck: Normal range of motion.  Cardiovascular: Normal rate, regular rhythm and normal heart sounds.    Pulmonary/Chest: Effort normal and breath sounds normal.  Abdominal: Soft. Bowel sounds are normal.  Musculoskeletal: Normal range of motion. She exhibits no edema, tenderness or deformity.  Lymphadenopathy:    She has no cervical adenopathy.  Neurological: She is alert and oriented to person, place, and time.  Skin: Skin is warm and dry. No rash noted. No erythema.  Psychiatric: She has a normal mood and affect. Her behavior is normal. Judgment and thought content normal.  Vitals reviewed.    Lab Results  Component Value Date   WBC 5.1 06/02/2017   HGB 14.4 06/02/2017   HCT 45.5 07/05/2017   MCV 91.2 07/05/2017   PLT 160 06/02/2017     Chemistry      Component Value Date/Time   NA 139 06/02/2017 2042   NA 141 04/04/2017 1103   K 3.5 06/02/2017 2042   K 3.9 04/04/2017 1103   CL 106 06/02/2017 2042   CL 104 04/04/2017 1103   CO2 24 06/02/2017 2042   CO2 28 04/04/2017 1103   BUN 10 06/02/2017 2042   BUN 9 04/04/2017 1103   CREATININE 0.77 06/02/2017 2042   CREATININE 0.8 04/04/2017 1103      Component Value Date/Time   CALCIUM 9.0 06/02/2017 2042   CALCIUM 9.6 04/04/2017 1103   ALKPHOS 70 06/02/2017 2042   ALKPHOS 80 04/04/2017 1103   AST 17 06/02/2017 2042   AST 25 04/04/2017 1103   ALT 14 06/02/2017 2042   ALT 27 04/04/2017 1103   BILITOT 0.8 06/02/2017 2042   BILITOT 0.80 04/04/2017 1103         Impression and Plan: Valerie Chen is a 84 year old white female. She has bad dementia. However, she does have some functionality.  She continues to do quite well.  Again, I do not see any indication that we have to intervene with this mesenteric mass.  It certainly could be a GIST tumor.  She is not a great candidate for any type of surgical resection.  Her labs all look fine.  We will get her back in 4 months.  I will rescan her when we see her back.  Again, I am glad that her quality of life is doing so well.  Her caretaker has done an incredible job with  her and given her the opportunity to have that great quality of life.  Volanda Napoleon, MD 1/15/201910:50 AM

## 2017-07-09 DIAGNOSIS — R300 Dysuria: Secondary | ICD-10-CM | POA: Diagnosis not present

## 2017-07-09 DIAGNOSIS — J018 Other acute sinusitis: Secondary | ICD-10-CM | POA: Diagnosis not present

## 2017-07-15 DIAGNOSIS — J018 Other acute sinusitis: Secondary | ICD-10-CM | POA: Diagnosis not present

## 2017-07-15 DIAGNOSIS — R05 Cough: Secondary | ICD-10-CM | POA: Diagnosis not present

## 2017-07-15 DIAGNOSIS — R300 Dysuria: Secondary | ICD-10-CM | POA: Diagnosis not present

## 2017-07-23 DIAGNOSIS — R300 Dysuria: Secondary | ICD-10-CM | POA: Diagnosis not present

## 2017-07-23 DIAGNOSIS — J018 Other acute sinusitis: Secondary | ICD-10-CM | POA: Diagnosis not present

## 2017-08-22 ENCOUNTER — Telehealth: Payer: Self-pay | Admitting: Family Medicine

## 2017-08-22 DIAGNOSIS — R300 Dysuria: Secondary | ICD-10-CM | POA: Diagnosis not present

## 2017-08-22 NOTE — Telephone Encounter (Signed)
See note.  Copied from [place]. Topic: General - Other >> Aug 22, 2017 12:39 PM Scherrie Gerlach wrote: Reason for CRM: Langley Gauss the caregiver called to advise the pt may have UTI.  Pt has appt tomorrow, but they do not want to wait until tomorrow to have this addressed.  Pt's urine has a bad odor and is dark in color. No appt at the office and denise was upset pt could not be seen today.  I offered to send a message to the office, or see if another office, called [facility] and told her it was just up the street, and she declined.  Langley Gauss states she will call the family and "let them know". This is a Pharmacist, hospital

## 2017-08-22 NOTE — Telephone Encounter (Signed)
Patients care giver is taking patient to urgent care. She has appointment tomorrow to see Dr. Juleen China

## 2017-08-22 NOTE — Telephone Encounter (Signed)
DO you want to get a urine sample?

## 2017-08-22 NOTE — Telephone Encounter (Signed)
Have her come by and get urine with micro and culture.

## 2017-08-23 ENCOUNTER — Ambulatory Visit (INDEPENDENT_AMBULATORY_CARE_PROVIDER_SITE_OTHER): Payer: Medicare Other | Admitting: Family Medicine

## 2017-08-23 ENCOUNTER — Encounter: Payer: Self-pay | Admitting: Family Medicine

## 2017-08-23 VITALS — BP 110/78 | HR 83 | Resp 12 | Ht 62.0 in | Wt 167.0 lb

## 2017-08-23 DIAGNOSIS — H6121 Impacted cerumen, right ear: Secondary | ICD-10-CM

## 2017-08-23 DIAGNOSIS — R1900 Intra-abdominal and pelvic swelling, mass and lump, unspecified site: Secondary | ICD-10-CM

## 2017-08-23 DIAGNOSIS — N390 Urinary tract infection, site not specified: Secondary | ICD-10-CM

## 2017-08-23 LAB — COMPREHENSIVE METABOLIC PANEL
ALT: 21 U/L (ref 0–35)
AST: 19 U/L (ref 0–37)
Albumin: 4 g/dL (ref 3.5–5.2)
Alkaline Phosphatase: 68 U/L (ref 39–117)
BUN: 9 mg/dL (ref 6–23)
CO2: 30 mEq/L (ref 19–32)
Calcium: 10 mg/dL (ref 8.4–10.5)
Chloride: 104 mEq/L (ref 96–112)
Creatinine, Ser: 0.78 mg/dL (ref 0.40–1.20)
GFR: 74.28 mL/min (ref >60.00)
Glucose, Bld: 80 mg/dL (ref 70–99)
Potassium: 4.4 mEq/L (ref 3.5–5.1)
Sodium: 140 mEq/L (ref 135–145)
Total Bilirubin: 0.6 mg/dL (ref 0.2–1.2)
Total Protein: 6.8 g/dL (ref 6.0–8.3)

## 2017-08-23 LAB — CBC WITH DIFFERENTIAL/PLATELET
Basophils Absolute: 0 10*3/uL (ref 0.0–0.1)
Basophils Relative: 0.2 % (ref 0.0–3.0)
Eosinophils Absolute: 0 10*3/uL (ref 0.0–0.7)
Eosinophils Relative: 0 % (ref 0.0–5.0)
HCT: 45 % (ref 36.0–46.0)
Hemoglobin: 15.6 g/dL — ABNORMAL HIGH (ref 12.0–15.0)
Lymphocytes Relative: 34.6 % (ref 12.0–46.0)
Lymphs Abs: 1.3 10*3/uL (ref 0.7–4.0)
MCHC: 34.7 g/dL (ref 30.0–36.0)
MCV: 90.7 fl (ref 78.0–100.0)
Monocytes Absolute: 0.3 10*3/uL (ref 0.1–1.0)
Monocytes Relative: 8.2 % (ref 3.0–12.0)
Neutro Abs: 2.2 10*3/uL (ref 1.4–7.7)
Neutrophils Relative %: 57 % (ref 43.0–77.0)
Platelets: 194 10*3/uL (ref 150.0–400.0)
RBC: 4.96 Mil/uL (ref 3.87–5.11)
RDW: 13.1 % (ref 11.5–15.5)
WBC: 3.9 10*3/uL — ABNORMAL LOW (ref 4.0–10.5)

## 2017-08-23 MED ORDER — TRIMETHOPRIM 100 MG PO TABS: 100.0000 mg | ORAL_TABLET | Freq: Every day | ORAL | 11 refills | Status: DC

## 2017-08-23 NOTE — Progress Notes (Addendum)
Valerie Chen is a 84 y.o. female is here for follow up.  History of Present Illness:   Shaune Pascal CMA acting as scribe for Dr. Juleen China.  HPI: Patient comes in today for her three month follow up.   UTI: Patient was seen yesterday for UTI at Urgent care. She was put on Cipro BID for three days.   Impacted ear: Right ear is impacted today. Will do ear lavage.   Review of Systems  Constitutional: Negative for chills and fever.  HENT: Positive for hearing loss.        Ear wax in right ear.   Eyes: Negative for blurred vision and double vision.  Respiratory: Negative for cough and hemoptysis.   Cardiovascular: Negative for chest pain and palpitations.  Gastrointestinal: Negative for nausea and vomiting.  Genitourinary: Positive for frequency and hematuria.  Musculoskeletal: Negative for back pain and neck pain.  Neurological: Negative for headaches.   Health Maintenance Due  Topic Date Due  . TETANUS/TDAP  10/12/1947   Depression screen PHQ 2/9 06/02/2017 11/20/2014  Decreased Interest 0 0  Down, Depressed, Hopeless 0 0  PHQ - 2 Score 0 0   PMHx, SurgHx, SocialHx, FamHx, Medications, and Allergies were reviewed in the Visit Navigator and updated as appropriate.   Patient Active Problem List   Diagnosis Date Noted  . Difficulty walking 02/23/2017  . Dementia, previously on Seroquel, able to wean with family and caregiver support 01/21/2017  . Abdominal mass, monitored by [facility] 01/21/2017  . NSVT (nonsustained ventricular tachycardia) ([place]) 05/11/2016  . Recurrent UTI, on daily Trimethroprim 02/01/2015  . Essential hypertension, on no medications 05/30/2013  . Coronary artery disease   . Diastolic dysfunction   . TIA (transient ischemic attack), questionable, 07/2010, with negative carotid dopplers   . History of GI bleed, with hemorrhoids, last colonoscopy 2012 at time of bleed   . Dyslipidemia, no statin   . Arthralgia of multiple joints 05/22/2009  . Vitamin D  deficiency 01/27/2009   Social History   Tobacco Use  . Smoking status: Never Smoker  . Smokeless tobacco: Never Used  Substance Use Topics  . Alcohol use: No  . Drug use: Yes    Comment: hx of valium in 1980's - had 2 overdoses per daughter   Current Medications and Allergies:   .  acetaminophen (TYLENOL) 500 MG tablet, Take 1,000 mg by mouth every 6 (six) hours as needed for moderate pain., Disp: , Rfl:  .  Cholecalciferol (VITAMIN D-3) 1000 units CAPS, Take 1,000 Units by mouth daily. , Disp: , Rfl:  .  cyanocobalamin 500 MCG tablet, Take 500 mcg by mouth daily. , Disp: , Rfl:  .  docusate sodium (COLACE) 100 MG capsule, Take 100 mg by mouth daily. , Disp: , Rfl:  .  Melatonin 1 MG/ML LIQD, Take 5 mLs by mouth. Daily at 6:00PM, Disp: , Rfl:  .  UNABLE TO FIND, Fern dophilus one capsule daily at 9:00AM, Disp: , Rfl:  .  UNABLE TO FIND, Theanine 100 mg one capsule daily at 3:00PM, Disp: , Rfl:  .  UNABLE TO FIND, Maxi one multivitamin one daily at 9:00AM, Disp: , Rfl:   Allergies  Allergen Reactions  . Levaquin [Levofloxacin] Other (See Comments)    Altered mental status   . Rocephin [Ceftriaxone] Other (See Comments)    Uncontrollable chills/shaking, dizziness, and disorientation after receiving this (??)  . Statins Other (See Comments)    Severe myalgias   Review of Systems  Pertinent items are noted in the HPI. Otherwise, ROS is negative.  Vitals:   Vitals:   08/23/17 1044  BP: 110/78  Pulse: 83  Resp: 12  Weight: 167 lb (75.8 kg)  Height: 5\' 2"  (1.575 m)     Body mass index is 30.54 kg/m.  Physical Exam:   Physical Exam  Constitutional: She appears well-nourished.  HENT:  Head: Normocephalic and atraumatic.  Right Ear: Tympanic membrane normal.  Left cerumen impaction.  Eyes: EOM are normal. Pupils are equal, round, and reactive to light.  Neck: Normal range of motion. Neck supple.  Cardiovascular: Normal rate, regular rhythm, normal heart sounds and  intact distal pulses.  Pulmonary/Chest: Effort normal.  Abdominal: Soft. Bowel sounds are normal. She exhibits mass.  Skin: Skin is warm.  Psychiatric: She has a normal mood and affect. Her behavior is normal.  Nursing note and vitals reviewed.  Results for orders placed or performed in visit on 08/23/17  Comprehensive metabolic panel  Result Value Ref Range   Sodium 140 135 - 145 mEq/L   Potassium 4.4 3.5 - 5.1 mEq/L   Chloride 104 96 - 112 mEq/L   CO2 30 19 - 32 mEq/L   Glucose, Bld 80 70 - 99 mg/dL   BUN 9 6 - 23 mg/dL   Creatinine, Ser 0.78 0.40 - 1.20 mg/dL   Total Bilirubin 0.6 0.2 - 1.2 mg/dL   Alkaline Phosphatase 68 39 - 117 U/L   AST 19 0 - 37 U/L   ALT 21 0 - 35 U/L   Total Protein 6.8 6.0 - 8.3 g/dL   Albumin 4.0 3.5 - 5.2 g/dL   Calcium 10.0 8.4 - 10.5 mg/dL   GFR 74.28 >60.00 mL/min  CBC with Differential/Platelet  Result Value Ref Range   WBC 3.9 (L) 4.0 - 10.5 K/uL   RBC 4.96 3.87 - 5.11 Mil/uL   Hemoglobin 15.6 (H) 12.0 - 15.0 g/dL   HCT 45.0 36.0 - 46.0 %   MCV 90.7 78.0 - 100.0 fl   MCHC 34.7 30.0 - 36.0 g/dL   RDW 13.1 11.5 - 15.5 %   Platelets 194.0 150.0 - 400.0 K/uL   Neutrophils Relative % 57.0 43.0 - 77.0 %   Lymphocytes Relative 34.6 12.0 - 46.0 %   Monocytes Relative 8.2 3.0 - 12.0 %   Eosinophils Relative 0.0 0.0 - 5.0 %   Basophils Relative 0.2 0.0 - 3.0 %   Neutro Abs 2.2 1.4 - 7.7 K/uL   Lymphs Abs 1.3 0.7 - 4.0 K/uL   Monocytes Absolute 0.3 0.1 - 1.0 K/uL   Eosinophils Absolute 0.0 0.0 - 0.7 K/uL   Basophils Absolute 0.0 0.0 - 0.1 K/uL   Assessment and Plan:   Current Problem List updated with notes today.   Patient Active Problem List   Diagnosis Date Noted  . Difficulty walking 02/23/2017  . Dementia, previously on Seroquel, able to wean with family and caregiver support 01/21/2017  . Abdominal mass, monitored by [facility] 01/21/2017  . NSVT (nonsustained ventricular tachycardia) ([place]) 05/11/2016  . Recurrent UTI, on daily  Trimethroprim 02/01/2015  . Essential hypertension, on no medications 05/30/2013  . Coronary artery disease   . Diastolic dysfunction   . TIA (transient ischemic attack), questionable, 07/2010, with negative carotid dopplers   . History of GI bleed, with hemorrhoids, last colonoscopy 2012 at time of bleed   . Dyslipidemia, no statin   . Arthralgia of multiple joints 05/22/2009  . Vitamin D deficiency 01/27/2009  Ivan was seen today for follow-up and cough.  Diagnoses and all orders for this visit:  Frequent UTI Comments: Finishing Cipro for current UTI, then will continue on daily Trimethriprim for prophylaxis.  Orders: -     trimethoprim (TRIMPEX) 100 MG tablet; Take 1 tablet (100 mg total) by mouth daily.  Abdominal mass Comments: Will call to make an earlier appointment with Oncology for check-in. Orders: -     Comprehensive metabolic panel -     CBC with Differential/Platelet  Impacted cerumen of right ear Comments: Lavage today.  Procedure: Cerumen Disimpaction Hydrogen peroxide drops applied and gentle ear lavage performed on the right. There were no complications and following the disimpaction the tympanic membrane was visible on the right. Tympanic membranes are intact following the procedure.  Auditory canals are normal.  The patient reported relief of symptoms after removal of cerumen.   Orders Placed This Encounter  Procedures  . Comprehensive metabolic panel  . CBC with Differential/Platelet   Meds ordered this encounter  Medications  . trimethoprim (TRIMPEX) 100 MG tablet    Sig: Take 1 tablet (100 mg total) by mouth daily.    Dispense:  30 tablet    Refill:  11    . Reviewed expectations re: course of current medical issues. . Discussed self-management of symptoms. . Outlined signs and symptoms indicating need for more acute intervention. . Patient verbalized understanding and all questions were answered. Marland Kitchen Health Maintenance issues including  appropriate healthy diet, exercise, and smoking avoidance were discussed with patient. . See orders for this visit as documented in the electronic medical record. . Patient received an After Visit Summary.  CMA served as Education administrator during this visit. History, Physical, and Plan performed by medical provider. The above documentation has been reviewed and is accurate and complete. Briscoe Deutscher, D.O.  Briscoe Deutscher, DO [place], [facility] 08/27/2017

## 2017-08-27 ENCOUNTER — Encounter: Payer: Self-pay | Admitting: Family Medicine

## 2017-09-06 ENCOUNTER — Other Ambulatory Visit: Payer: Self-pay | Admitting: Family

## 2017-09-07 ENCOUNTER — Encounter (HOSPITAL_BASED_OUTPATIENT_CLINIC_OR_DEPARTMENT_OTHER): Payer: Self-pay

## 2017-09-07 ENCOUNTER — Ambulatory Visit (HOSPITAL_BASED_OUTPATIENT_CLINIC_OR_DEPARTMENT_OTHER)
Admission: RE | Admit: 2017-09-07 | Discharge: 2017-09-07 | Disposition: A | Discharge status: Home / Self Care | Payer: Medicare Other | Source: Ambulatory Visit | Attending: Hematology & Oncology | Admitting: Hematology & Oncology

## 2017-09-07 DIAGNOSIS — R1907 Generalized intra-abdominal and pelvic swelling, mass and lump: Secondary | ICD-10-CM | POA: Diagnosis not present

## 2017-09-07 DIAGNOSIS — R1900 Intra-abdominal and pelvic swelling, mass and lump, unspecified site: Secondary | ICD-10-CM | POA: Diagnosis not present

## 2017-09-07 DIAGNOSIS — I700 Atherosclerosis of aorta: Secondary | ICD-10-CM | POA: Diagnosis not present

## 2017-09-07 MED ORDER — IOPAMIDOL (ISOVUE-300) INJECTION 61%
100.0000 mL | Freq: Once | INTRAVENOUS | Status: AC | PRN
  Administered 2017-09-07: 100 mL via INTRAVENOUS

## 2017-09-08 ENCOUNTER — Telehealth: Payer: Self-pay | Admitting: *Deleted

## 2017-09-08 NOTE — Telephone Encounter (Addendum)
Patient caregiver, Langley Gauss aware of the results. Langley Gauss is on DPR  ----- Message from Volanda Napoleon, MD sent at 09/07/2017  5:55 PM EDT ----- Call - the mass is about 10% larger.  This is still considered stable!!  We are still doing ok!!! Laurey Arrow

## 2017-11-01 ENCOUNTER — Inpatient Hospital Stay: Payer: Medicare Other | Attending: Hematology & Oncology | Admitting: Hematology & Oncology

## 2017-11-01 ENCOUNTER — Other Ambulatory Visit (HOSPITAL_BASED_OUTPATIENT_CLINIC_OR_DEPARTMENT_OTHER): Payer: Medicare Other

## 2017-11-01 ENCOUNTER — Inpatient Hospital Stay: Payer: Medicare Other

## 2017-11-01 DIAGNOSIS — R1907 Generalized intra-abdominal and pelvic swelling, mass and lump: Secondary | ICD-10-CM

## 2017-11-01 DIAGNOSIS — F0390 Unspecified dementia without behavioral disturbance: Secondary | ICD-10-CM | POA: Diagnosis not present

## 2017-11-01 DIAGNOSIS — R1909 Other intra-abdominal and pelvic swelling, mass and lump: Secondary | ICD-10-CM | POA: Diagnosis not present

## 2017-11-01 DIAGNOSIS — R1900 Intra-abdominal and pelvic swelling, mass and lump, unspecified site: Secondary | ICD-10-CM

## 2017-11-01 LAB — CBC WITH DIFFERENTIAL (CANCER CENTER ONLY)
BASOS PCT: 0 %
Basophils Absolute: 0 10*3/uL (ref 0.0–0.1)
EOS ABS: 0 10*3/uL (ref 0.0–0.5)
EOS PCT: 0 %
HEMATOCRIT: 47.5 % — AB (ref 34.8–46.6)
Hemoglobin: 16.6 g/dL — ABNORMAL HIGH (ref 11.6–15.9)
LYMPHS ABS: 1.7 10*3/uL (ref 0.9–3.3)
Lymphocytes Relative: 38 %
MCH: 31.9 pg (ref 26.0–34.0)
MCHC: 34.9 g/dL (ref 32.0–36.0)
MCV: 91.3 fL (ref 81.0–101.0)
Monocytes Absolute: 0.5 10*3/uL (ref 0.1–0.9)
Monocytes Relative: 10 %
Neutro Abs: 2.2 10*3/uL (ref 1.5–6.5)
Neutrophils Relative %: 52 %
PLATELETS: 194 10*3/uL (ref 145–400)
RBC: 5.2 MIL/uL (ref 3.70–5.32)
RDW: 13.1 % (ref 11.1–15.7)
WBC: 4.4 10*3/uL (ref 3.9–10.0)

## 2017-11-01 LAB — CMP (CANCER CENTER ONLY)
ALT: 29 U/L (ref 0–55)
AST: 25 U/L (ref 5–34)
Albumin: 4.3 g/dL (ref 3.5–5.0)
Alkaline Phosphatase: 90 U/L (ref 40–150)
Anion gap: 10 (ref 3–11)
BILIRUBIN TOTAL: 0.5 mg/dL (ref 0.2–1.2)
BUN: 9 mg/dL (ref 7–26)
CO2: 25 mmol/L (ref 22–29)
CREATININE: 0.91 mg/dL (ref 0.60–1.10)
Calcium: 9.9 mg/dL (ref 8.4–10.4)
Chloride: 106 mmol/L (ref 98–109)
GFR, EST NON AFRICAN AMERICAN: 55 mL/min — AB (ref >60)
GFR, Est AFR Am: >60 mL/min (ref >60)
Glucose, Bld: 72 mg/dL (ref 70–140)
Potassium: 4.2 mmol/L (ref 3.5–5.1)
Sodium: 141 mmol/L (ref 136–145)
TOTAL PROTEIN: 7.1 g/dL (ref 6.4–8.3)

## 2017-11-01 LAB — LACTATE DEHYDROGENASE: LDH: 212 U/L (ref 125–245)

## 2017-11-01 NOTE — Progress Notes (Signed)
Hematology and Oncology Follow Up Visit  Valerie Chen [id number] [birth year] 84 y.o. 11/01/2017   Principle Diagnosis:   Left mesenteric mass  Current Therapy:    Observation     Interim History:  Valerie Chen is in for her follow-up. She comes in with her caretaker. Her daughter is on the cell phone listening.  It looks like this mesenteric mass is still growing.  Had a CT scan done she apparently in March.  This was done on March 20.  The CT scan showed that this mesenteric mass now measured 5.6 x 6.7 x 4.5 cm.  His volume was 88 cm.  This is up by 10% compared to her last CT scan in January.  She is not symptomatic.  She has had no nausea or vomiting.  She has had no abdominal pain.  The CT scan apparently was done because she was having some urinary issues.  These have resolved.  She seems to be eating okay.  She is had no problems with her appetite.  There is no bleeding.  Overall, her performance status is ECOG 2.   Medications:  Current Outpatient Medications:  .  acetaminophen (TYLENOL) 500 MG tablet, Take 1,000 mg by mouth every 6 (six) hours as needed for moderate pain., Disp: , Rfl:  .  Cholecalciferol (VITAMIN D-3) 1000 units CAPS, Take 1,000 Units by mouth daily. , Disp: , Rfl:  .  cyanocobalamin 500 MCG tablet, Take 500 mcg by mouth daily. , Disp: , Rfl:  .  docusate sodium (COLACE) 100 MG capsule, Take 100 mg by mouth daily. , Disp: , Rfl:  .  Melatonin 1 MG/ML LIQD, Take 5 mLs by mouth. Daily at 6:00PM, Disp: , Rfl:  .  trimethoprim (TRIMPEX) 100 MG tablet, Take 1 tablet (100 mg total) by mouth daily., Disp: 30 tablet, Rfl: 11 .  UNABLE TO FIND, Fern dophilus one capsule daily at 9:00AM, Disp: , Rfl:  .  UNABLE TO FIND, Theanine 100 mg one capsule daily at 3:00PM, Disp: , Rfl:  .  UNABLE TO FIND, Maxi one multivitamin one daily at 9:00AM, Disp: , Rfl:   Allergies:  Allergies  Allergen Reactions  . Levaquin [Levofloxacin] Other (See Comments)    Altered  mental status   . Rocephin [Ceftriaxone] Other (See Comments)    Uncontrollable chills/shaking, dizziness, and disorientation after receiving this (??)  . Statins Other (See Comments)    Severe myalgias    Past Medical History, Surgical history, Social history, and Family History were reviewed and updated.  Review of Systems: Review of Systems  Constitutional: Negative for appetite change, fatigue, fever and unexpected weight change.  HENT:   Negative for lump/mass, mouth sores, sore throat and trouble swallowing.   Respiratory: Negative for cough, hemoptysis and shortness of breath.   Cardiovascular: Negative for leg swelling and palpitations.  Gastrointestinal: Negative for abdominal distention, abdominal pain, blood in stool, constipation, diarrhea, nausea and vomiting.  Genitourinary: Negative for bladder incontinence, dysuria, frequency and hematuria.   Musculoskeletal: Negative for arthralgias, back pain, gait problem and myalgias.  Skin: Negative for itching and rash.  Neurological: Negative for dizziness, extremity weakness, gait problem, headaches, numbness, seizures and speech difficulty.  Hematological: Does not bruise/bleed easily.  Psychiatric/Behavioral: Negative for depression and sleep disturbance. The patient is not nervous/anxious.     Physical Exam:  weight is 160 lb 8 oz (72.8 kg). Her axillary temperature is 94 F (34.4 C) (abnormal). Her blood pressure is 134/51 (abnormal) and her pulse is  74. Her respiration is 16 and oxygen saturation is 100%.   Wt Readings from Last 3 Encounters:  11/01/17 160 lb 8 oz (72.8 kg)  08/23/17 167 lb (75.8 kg)  07/05/17 170 lb (77.1 kg)    Physical Exam  Constitutional: She is oriented to person, place, and time.  HENT:  Head: Normocephalic and atraumatic.  Mouth/Throat: Oropharynx is clear and moist.  Eyes: Pupils are equal, round, and reactive to light. EOM are normal.  Neck: Normal range of motion.  Cardiovascular:  Normal rate, regular rhythm and normal heart sounds.  Pulmonary/Chest: Effort normal and breath sounds normal.  Abdominal: Soft. Bowel sounds are normal.  Musculoskeletal: Normal range of motion. She exhibits no edema, tenderness or deformity.  Lymphadenopathy:    She has no cervical adenopathy.  Neurological: She is alert and oriented to person, place, and time.  Skin: Skin is warm and dry. No rash noted. No erythema.  Psychiatric: She has a normal mood and affect. Her behavior is normal. Judgment and thought content normal.  Vitals reviewed.    Lab Results  Component Value Date   WBC 4.4 11/01/2017   HGB 16.6 (H) 11/01/2017   HCT 47.5 (H) 11/01/2017   MCV 91.3 11/01/2017   PLT 194 11/01/2017     Chemistry      Component Value Date/Time   NA 140 08/23/2017 1110   NA 141 04/04/2017 1103   K 4.4 08/23/2017 1110   K 3.9 04/04/2017 1103   CL 104 08/23/2017 1110   CL 104 04/04/2017 1103   CO2 30 08/23/2017 1110   CO2 28 04/04/2017 1103   BUN 9 08/23/2017 1110   BUN 9 04/04/2017 1103   CREATININE 0.78 08/23/2017 1110   CREATININE 0.80 07/05/2017 0921   CREATININE 0.8 04/04/2017 1103      Component Value Date/Time   CALCIUM 10.0 08/23/2017 1110   CALCIUM 9.6 04/04/2017 1103   ALKPHOS 68 08/23/2017 1110   ALKPHOS 80 04/04/2017 1103   AST 19 08/23/2017 1110   AST 19 07/05/2017 0921   ALT 21 08/23/2017 1110   ALT 22 07/05/2017 0921   ALT 27 04/04/2017 1103   BILITOT 0.6 08/23/2017 1110   BILITOT 0.6 07/05/2017 0921         Impression and Plan: Valerie Chen is a 84 year old white female. She has bad dementia. However, she does have some functionality.  At this point, I think another CT scan in July would be necessary.  I think if this next CT scan shows growth that is significant, then we may have to consider a biopsy or may be an MRI to see if this can give [place] some more information.  Her daughter listened by cell phone.  I spent about 30 minutes with Valerie Chen  and her caretaker.  All the time was spent face-to-face.  I reviewed her scan results.  I went over the labs.  We will see her back in July.  We will get her CT scan the same day that I see her.    Volanda Napoleon, MD 5/14/201910:43 AM

## 2017-11-27 NOTE — Progress Notes (Signed)
Valerie Chen is a 84 y.o. female is here for follow up.  History of Present Illness:   HPI: Doing well overall. No UTI symptoms in > 3 months since starting Trimethoprim. Abdominal mass enlarging. Plan is to get another scan in one month. Possible biopsy/MRI after.  Good mood. Eating well. No bowel changes. Needs TDAP.  Skin lesions on flank and back. Dark. Changing.   Right breast/axilla enlargement with inverted nipple. No mammograms in several years.   Health Maintenance Due  Topic Date Due  . TETANUS/TDAP  10/12/1947   Depression screen PHQ 2/9 06/02/2017 11/20/2014  Decreased Interest 0 0  Down, Depressed, Hopeless 0 0  PHQ - 2 Score 0 0   PMHx, SurgHx, SocialHx, FamHx, Medications, and Allergies were reviewed in the Visit Navigator and updated as appropriate.   Patient Active Problem List   Diagnosis Date Noted  . Breast mass, right 11/29/2017  . Skin lesion of back 11/29/2017  . Difficulty walking 02/23/2017  . Dementia, previously on Seroquel, able to wean with family and caregiver support 01/21/2017  . Abdominal mass, monitored by [facility] 01/21/2017  . NSVT (nonsustained ventricular tachycardia) ([place]) 05/11/2016  . Recurrent UTI, on daily Trimethroprim 02/01/2015  . Essential hypertension, on no medications 05/30/2013  . Coronary artery disease   . Diastolic dysfunction   . TIA (transient ischemic attack), questionable, 07/2010, with negative carotid dopplers   . History of GI bleed, with hemorrhoids, last colonoscopy 2012 at time of bleed   . Dyslipidemia, no statin   . Arthralgia of multiple joints 05/22/2009  . Vitamin D deficiency 01/27/2009   Social History   Tobacco Use  . Smoking status: Never Smoker  . Smokeless tobacco: Never Used  Substance Use Topics  . Alcohol use: No  . Drug use: Yes    Comment: hx of valium in 1980's - had 2 overdoses per daughter   Current Medications and Allergies:   .  acetaminophen (TYLENOL) 500 MG tablet, Take 1,000  mg by mouth every 6 (six) hours as needed for moderate pain., Disp: , Rfl:  .  Cholecalciferol (VITAMIN D-3) 1000 units CAPS, Take 1,000 Units by mouth daily. , Disp: , Rfl:  .  cyanocobalamin 500 MCG tablet, Take 500 mcg by mouth daily. , Disp: , Rfl:  .  Melatonin 1 MG/ML LIQD, Take 2.5 mLs by mouth. Daily at 6:00PM, Disp: , Rfl:  .  trimethoprim (TRIMPEX) 100 MG tablet, Take 1 tablet (100 mg total) by mouth daily., Disp: 30 tablet, Rfl: 11 .  UNABLE TO FIND, Fern dophilus one capsule daily at 9:00AM, Disp: , Rfl:  .  UNABLE TO FIND, Theanine 100 mg one capsule daily at 3:00PM, Disp: , Rfl:  .  UNABLE TO FIND, Maxi one multivitamin one daily at 9:00AM, Disp: , Rfl:    Allergies  Allergen Reactions  . Levaquin [Levofloxacin] Other (See Comments)    Altered mental status   . Rocephin [Ceftriaxone] Other (See Comments)    Uncontrollable chills/shaking, dizziness, and disorientation after receiving this (??)  . Statins Other (See Comments)    Severe myalgias   Review of Systems   Pertinent items are noted in the HPI. Otherwise, ROS is negative.  Vitals:   Vitals:   11/29/17 0904  BP: 140/70  Pulse: 68  Temp: 97.8 F (36.6 C)  TempSrc: Oral  SpO2: 96%  Weight: 160 lb 3.2 oz (72.7 kg)  Height: 5\' 2"  (1.575 m)     Body mass index is 29.3  kg/m.  Physical Exam:   Physical Exam  Constitutional: She appears well-nourished.  Pleasantly demented. Cooperative.  HENT:  Head: Normocephalic and atraumatic.  Eyes: Pupils are equal, round, and reactive to light. EOM are normal.  Neck: Normal range of motion. Neck supple.  Cardiovascular: Normal rate, regular rhythm, normal heart sounds and intact distal pulses.  Pulmonary/Chest: Effort normal. Right breast exhibits inverted nipple and mass. Right breast exhibits no nipple discharge, no skin change and no tenderness. Left breast exhibits no inverted nipple, no mass, no nipple discharge and no skin change.  Abdominal: Soft.  Skin:  Skin is warm.  Two dark and irritated SKs on right flank and back.  Psychiatric: She has a normal mood and affect. Her behavior is normal.  Nursing note and vitals reviewed.  Assessment and Plan:   Diagnoses and all orders for this visit:  Skin lesion of back Comments: Schedule follow up for shave biopsy x 2.  Breast mass, right -     MM DIAG BREAST TOMO BILATERAL; Future -     US BREAST LTD UNI RIGHT INC AXILLA; Future  Alzheimer's dementia without behavioral disturbance, unspecified timing of dementia onset Comments: Doing well. Loves caregiver.  Frequent UTI Comments: Doing well.    . Reviewed expectations re: course of current medical issues. . Discussed self-management of symptoms. . Outlined signs and symptoms indicating need for more acute intervention. . Patient verbalized understanding and all questions were answered. Marland Kitchen Health Maintenance issues including appropriate healthy diet, exercise, and smoking avoidance were discussed with patient. . See orders for this visit as documented in the electronic medical record. . Patient received an After Visit Summary.   Briscoe Deutscher, DO [place], [facility] 11/29/2017

## 2017-11-28 ENCOUNTER — Ambulatory Visit (INDEPENDENT_AMBULATORY_CARE_PROVIDER_SITE_OTHER): Payer: Medicare Other | Admitting: Family Medicine

## 2017-11-28 ENCOUNTER — Encounter: Payer: Self-pay | Admitting: Family Medicine

## 2017-11-28 VITALS — BP 140/70 | HR 68 | Temp 97.8°F | Ht 62.0 in | Wt 160.2 lb

## 2017-11-28 DIAGNOSIS — N6310 Unspecified lump in the right breast, unspecified quadrant: Secondary | ICD-10-CM | POA: Diagnosis not present

## 2017-11-28 DIAGNOSIS — F0280 Dementia in other diseases classified elsewhere without behavioral disturbance: Secondary | ICD-10-CM

## 2017-11-28 DIAGNOSIS — L989 Disorder of the skin and subcutaneous tissue, unspecified: Secondary | ICD-10-CM

## 2017-11-28 DIAGNOSIS — G309 Alzheimer's disease, unspecified: Secondary | ICD-10-CM

## 2017-11-28 DIAGNOSIS — N390 Urinary tract infection, site not specified: Secondary | ICD-10-CM

## 2017-11-29 DIAGNOSIS — N6310 Unspecified lump in the right breast, unspecified quadrant: Secondary | ICD-10-CM | POA: Insufficient documentation

## 2017-11-29 DIAGNOSIS — L989 Disorder of the skin and subcutaneous tissue, unspecified: Secondary | ICD-10-CM | POA: Insufficient documentation

## 2017-12-04 NOTE — Progress Notes (Signed)
Valerie Chen is a 84 y.o. female is here for follow up.  History of Present Illness:   HPI: Patient presents for mole removal x 3 - will send for biopsy.  There are no preventive care reminders to display for this patient. Depression screen [facility] 2/9 06/02/2017 11/20/2014  Decreased Interest 0 0  Down, Depressed, Hopeless 0 0  PHQ - 2 Score 0 0   PMHx, SurgHx, SocialHx, FamHx, Medications, and Allergies were reviewed in the Visit Navigator and updated as appropriate.   Patient Active Problem List   Diagnosis Date Noted  . Breast mass, right 11/29/2017  . Skin lesion of back 11/29/2017  . Difficulty walking 02/23/2017  . Dementia, previously on Seroquel, able to wean with family and caregiver support 01/21/2017  . Abdominal mass, monitored by [facility] 01/21/2017  . NSVT (nonsustained ventricular tachycardia) ([place]) 05/11/2016  . Recurrent UTI, on daily Trimethroprim 02/01/2015  . Essential hypertension, on no medications 05/30/2013  . Coronary artery disease   . Diastolic dysfunction   . TIA (transient ischemic attack), questionable, 07/2010, with negative carotid dopplers   . History of GI bleed, with hemorrhoids, last colonoscopy 2012 at time of bleed   . Dyslipidemia, no statin   . Arthralgia of multiple joints 05/22/2009  . Vitamin D deficiency 01/27/2009   Social History   Tobacco Use  . Smoking status: Never Smoker  . Smokeless tobacco: Never Used  Substance Use Topics  . Alcohol use: No  . Drug use: Yes    Comment: hx of valium in 1980's - had 2 overdoses per daughter   Current Medications and Allergies:   Current Outpatient Medications:  .  acetaminophen (TYLENOL) 500 MG tablet, Take 1,000 mg by mouth every 6 (six) hours as needed for moderate pain., Disp: , Rfl:  .  Cholecalciferol (VITAMIN D-3) 1000 units CAPS, Take 1,000 Units by mouth daily. , Disp: , Rfl:  .  cyanocobalamin 500 MCG tablet, Take 500 mcg by mouth daily. , Disp: , Rfl:  .  Melatonin 1 MG/ML  LIQD, Take 5 mLs by mouth. Daily at 6:00PM, Disp: , Rfl:  .  trimethoprim (TRIMPEX) 100 MG tablet, Take 1 tablet (100 mg total) by mouth daily., Disp: 30 tablet, Rfl: 11 .  UNABLE TO FIND, Fern dophilus one capsule daily at 9:00AM, Disp: , Rfl:  .  UNABLE TO FIND, [place] one multivitamin one daily at 9:00AM, Disp: , Rfl:    Allergies  Allergen Reactions  . Levaquin [Levofloxacin] Other (See Comments)    Altered mental status   . Rocephin [Ceftriaxone] Other (See Comments)    Uncontrollable chills/shaking, dizziness, and disorientation after receiving this (??)  . Statins Other (See Comments)    Severe myalgias   Review of Systems   Pertinent items are noted in the HPI. Otherwise, ROS is negative.  Vitals:   Vitals:   12/05/17 1012  BP: 136/74  Pulse: 72  Temp: 98.6 F (37 C)  TempSrc: Oral  SpO2: 97%  Weight: 161 lb 3.2 oz (73.1 kg)  Height: 5\' 2"  (1.575 m)     Body mass index is 29.48 kg/m.  Physical Exam:   Physical Exam  Skin:       Assessment and Plan:   [name] was seen today for follow-up.  Diagnoses and all orders for this visit:  Skin lesion of back  Procedure Note:   Procedure:  Skin biopsy Indication:  Suspicious lesion(s)  Risks including unsuccessful procedure, bleeding, infection, bruising, scar, a need for  another complete procedure and others were explained to the patient in detail as well as the benefits. Verbal informed consent was obtained.  Lesion #1 on upper back measuring 3 mm Lesion #2 on lower back measuring 2 cm Lesion #3 on right flank measuring 2 cm  Skin over lesion was prepped with Betadine and alcohol  and anesthetized with 1 cc of 2% lidocaine and epinephrine, using a 25-gauge 1 inch needle.  Shave biopsy with a sterile Dermablade was carried out in the usual fashion. Drysol for hemostasis. Band-Aid was applied with antibiotic ointment.  . Reviewed expectations re: course of current medical issues. . Discussed self-management  of symptoms. . Outlined signs and symptoms indicating need for more acute intervention. . Patient verbalized understanding and all questions were answered. Marland Kitchen Health Maintenance issues including appropriate healthy diet, exercise, and smoking avoidance were discussed with patient. . See orders for this visit as documented in the electronic medical record. . Patient received an After Visit Summary.  Briscoe Deutscher, DO [name] [place] 12/05/2017  Future Appointments  Date Time Provider [place]  12/06/2017 10:10 AM GI-BCG DIAG [place] 2 GI-BCGMM GI-BREAST CE  12/06/2017 10:20 AM GI-BCG [place] 2 GI-BCGUS GI-BREAST CE  01/10/2018  8:30 AM [facility]-CT 1 [facility]  01/10/2018  9:15 AM [facility] LAB [facility] None  01/10/2018  9:30 AM [place], Rudell Cobb, MD [facility] None

## 2017-12-05 ENCOUNTER — Encounter: Payer: Self-pay | Admitting: Family Medicine

## 2017-12-05 ENCOUNTER — Ambulatory Visit (INDEPENDENT_AMBULATORY_CARE_PROVIDER_SITE_OTHER): Payer: Medicare Other | Admitting: Family Medicine

## 2017-12-05 VITALS — BP 136/74 | HR 72 | Temp 98.6°F | Ht 62.0 in | Wt 161.2 lb

## 2017-12-05 DIAGNOSIS — L821 Other seborrheic keratosis: Secondary | ICD-10-CM

## 2017-12-05 DIAGNOSIS — L989 Disorder of the skin and subcutaneous tissue, unspecified: Secondary | ICD-10-CM

## 2017-12-06 ENCOUNTER — Ambulatory Visit
Admission: RE | Admit: 2017-12-06 | Discharge: 2017-12-06 | Disposition: A | Discharge status: Home / Self Care | Payer: Medicare Other | Source: Ambulatory Visit | Attending: Family Medicine | Admitting: Family Medicine

## 2017-12-06 DIAGNOSIS — R928 Other abnormal and inconclusive findings on diagnostic imaging of breast: Secondary | ICD-10-CM | POA: Diagnosis not present

## 2017-12-06 DIAGNOSIS — N6310 Unspecified lump in the right breast, unspecified quadrant: Secondary | ICD-10-CM

## 2017-12-06 DIAGNOSIS — N6001 Solitary cyst of right breast: Secondary | ICD-10-CM | POA: Diagnosis not present

## 2018-01-09 IMAGING — MR MR ABDOMEN WO/W CM
13 of 22 series · 20 of 48 positions shown · IV contrast (multihance)
Comparison: 01/21/2017 CT abdomen/pelvis.

CLINICAL DATA: Inpatient. Left mesenteric mass identified on recent
CT abdomen/pelvis study performed for abdominal pain. Dementia.

EXAM:
MRI ABDOMEN WITHOUT AND WITH CONTRAST
TECHNIQUE: Multiplanar multisequence MR imaging of the abdomen was performed
both before and after the administration of intravenous contrast.
CONTRAST:  17 cc MultiHance IV.

[Series 3: T2 fat-sat · axial · 5.0mm · 0.78mm/px · 1 of 45 slices shown (1 of 2)]
[im 1/45]
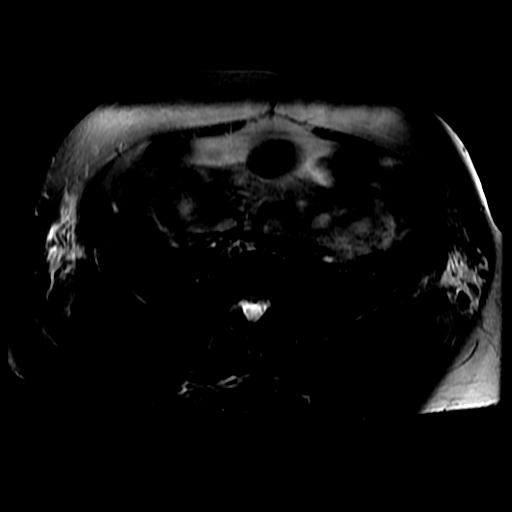

[Series 4: DWI b500 · axial · 6.0mm · 1.48mm/px · 1 of 60 slices shown (1 of 2)]
[im 1/60]
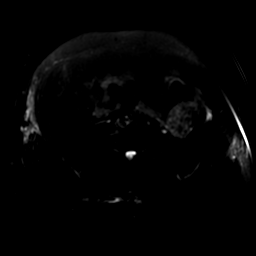

[Series 5: T2 · axial · 5.0mm · 0.78mm/px · 1 of 58 slices shown (1 of 2)]
[im 1/58]
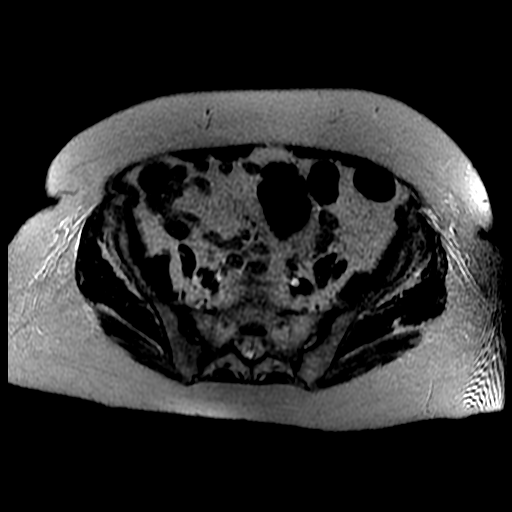

[Series 6: T2 · coronal · 5.0mm · 0.78mm/px · 1 of 34 slices shown (2 of 2)]
[im 1/34]
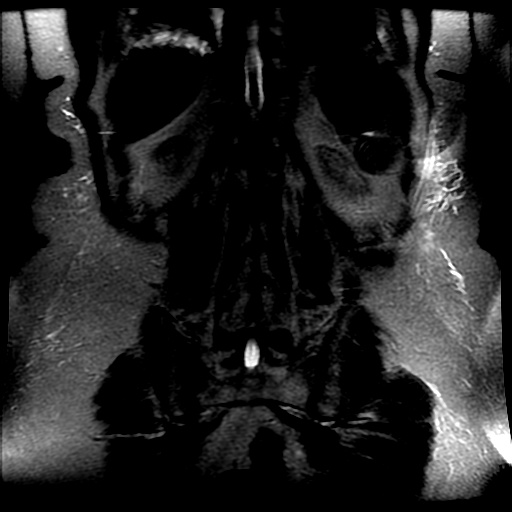

[Series 7: T2 fat-sat · axial · 5.0mm · 0.78mm/px · 1 of 58 slices shown (2 of 2)]
[im 1/58]
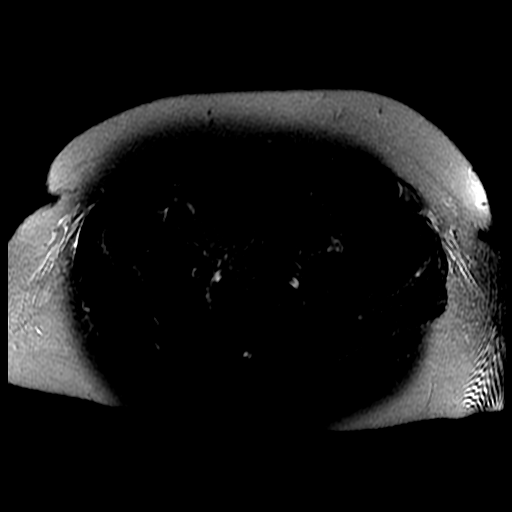

[Series 8: DWI b500 · axial · 6.0mm · 1.48mm/px · 1 of 60 slices shown (2 of 2)]
[im 1/60]
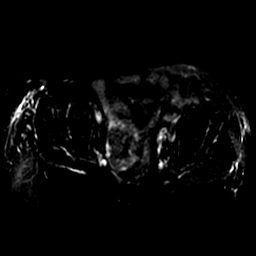

[Series 9: bSSFP · axial · 5.0mm · 0.78mm/px · z∈[-97,+193]mm · 2 of 59 slices shown]
[im 1/59]
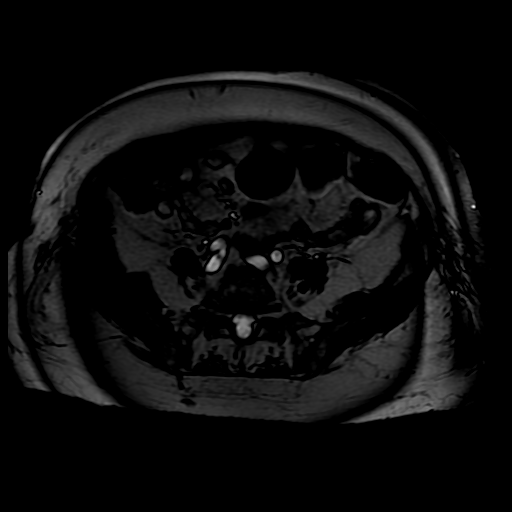
[im 59/59]
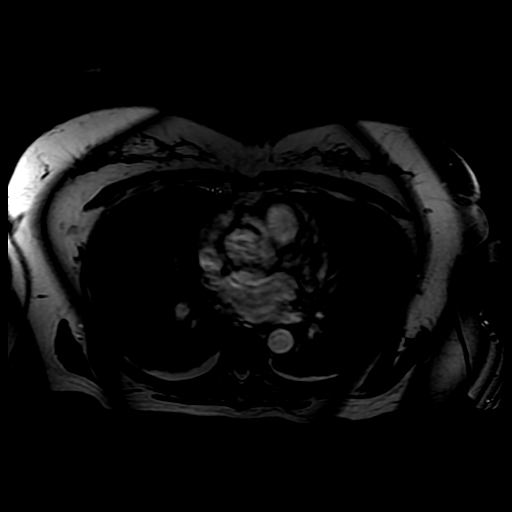

[Series 12: ax dualecho · axial · 5.0mm · 0.78mm/px · z∈[-162,+193]mm · 4 of 144 slices shown]
[im 1/144]
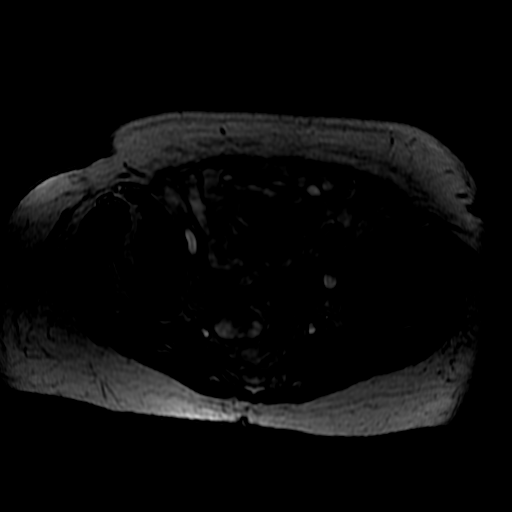
[im 48/144]
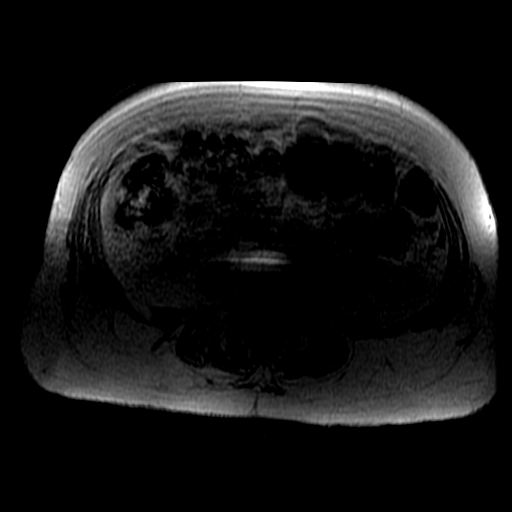
[im 96/144]
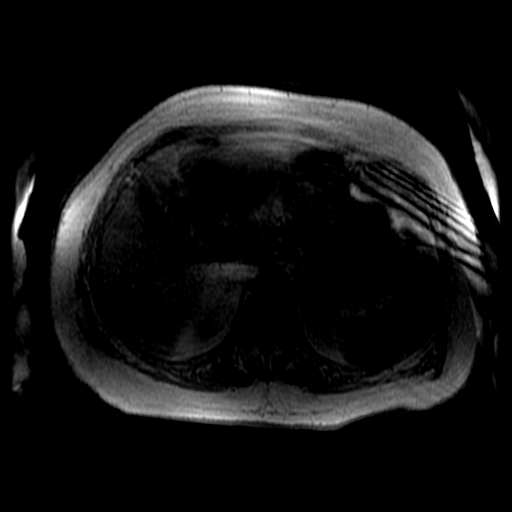
[im 144/144]
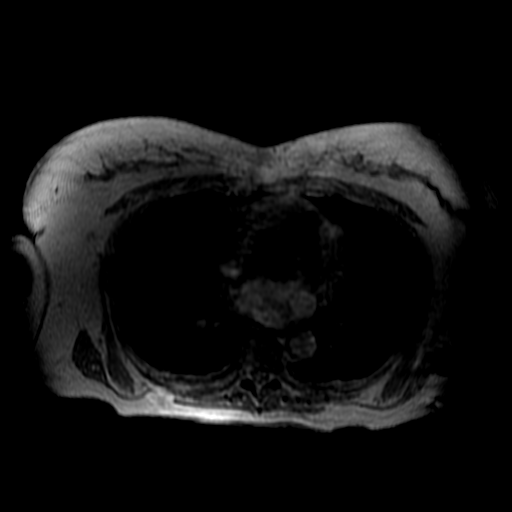

[Series 13: T1 · axial · 5.0mm · 0.78mm/px · 1 of 35 slices shown]
[im 1/35]
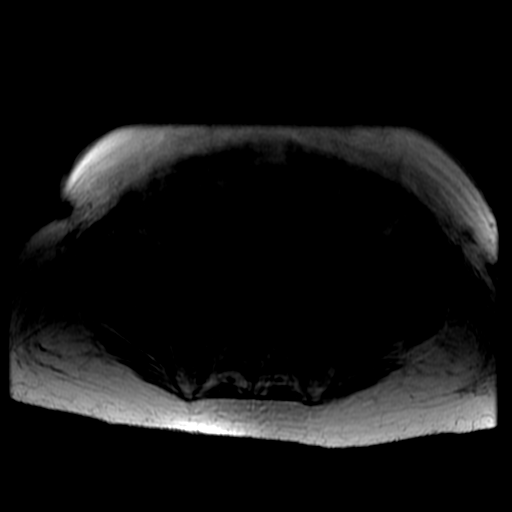

[Series 400: DWI · axial · 6.0mm · 1.48mm/px · 1 of 30 slices shown (1 of 2)]
[im 1/30]
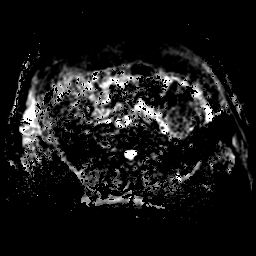

[Series 800: DWI · axial · 6.0mm · 1.48mm/px · 1 of 30 slices shown (2 of 2)]
[im 1/30]
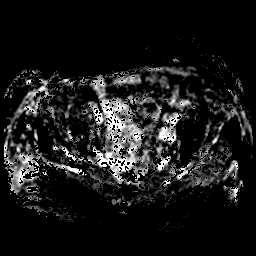

[Series 1500: T1 dynamic · axial · 5.0mm · 0.78mm/px · z∈[-117,+121]mm · 3 of 96 slices shown (1 of 2)]
[im 1/96]
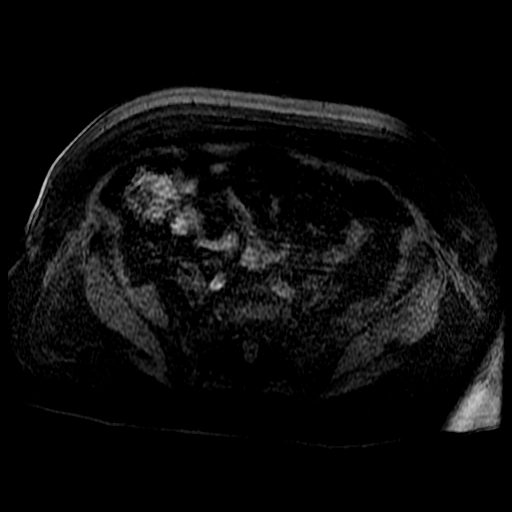
[im 48/96]
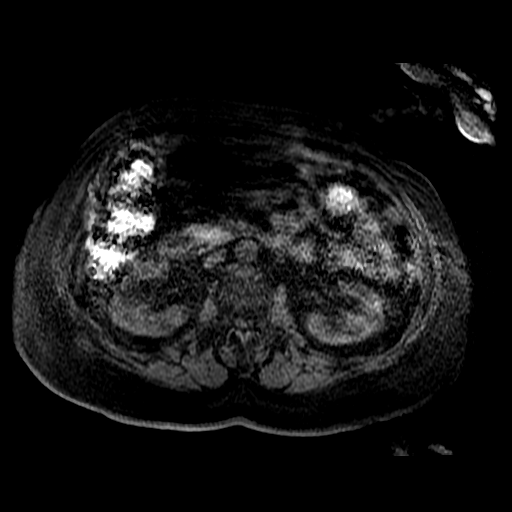
[im 96/96]
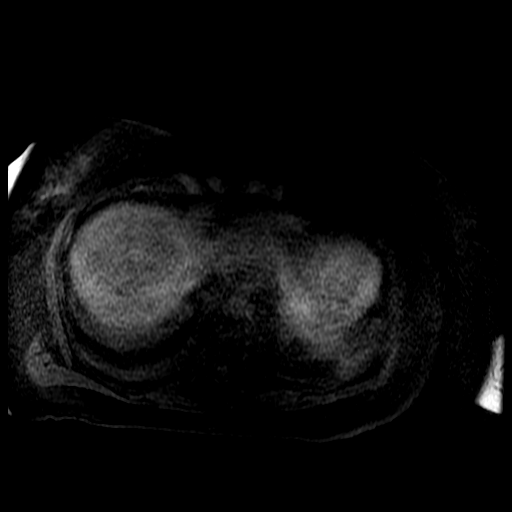

[Series 1501: T1 dynamic · axial · 5.0mm · 0.78mm/px · z∈[-117,+1]mm · 2 of 96 slices shown (2 of 2)]
[im 1/96]
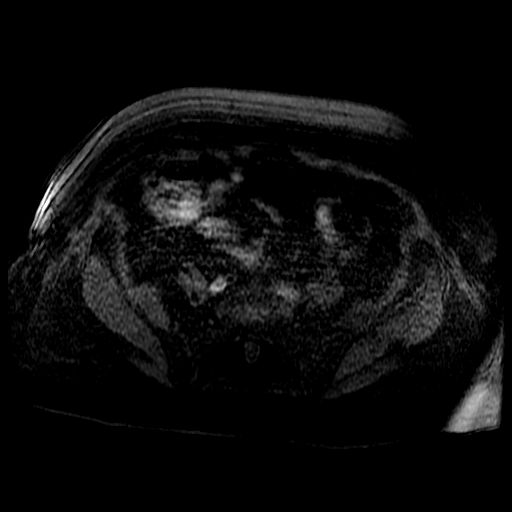
[im 48/96]
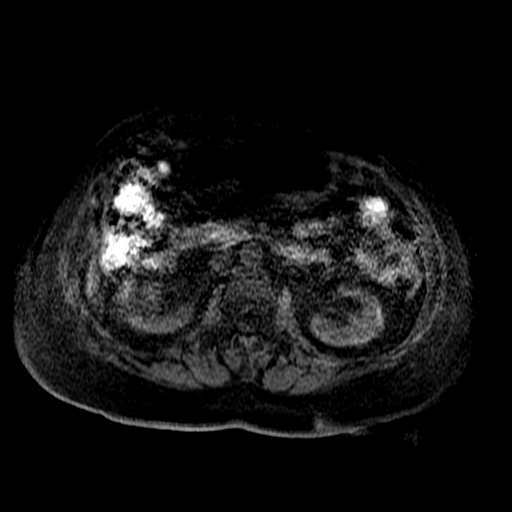

[20 of 48 positions shown; findings below may reference images not displayed]

FINDINGS: By report, the patient suffers from severe dementia. Despite the
best attempts by the patient and technologist, examination is
significantly degraded by motion artifact. No evidence of
intravenous contrast opacification on the postcontrast images. Per
the technologist, the gauze surrounding the patient's IV site was
wet, with no evidence of IV site infiltration.

Lower chest: Mild dependent right basilar atelectasis .

Hepatobiliary: Normal liver size and configuration. No hepatic
steatosis. Subcentimeter T2 hyperintense segment 7 right liver lobe
lesion near the IVC is stable since 07/31/2008 CT, compatible with a
benign lesion, probably a simple liver cyst. No additional liver
lesions. Normal gallbladder with no cholelithiasis. No biliary
ductal dilatation. Common bile duct diameter 5 mm. No
choledocholithiasis.

Pancreas: No pancreatic mass or duct dilation.  No pancreas divisum.

Spleen: Normal size. No mass.

Adrenals/Urinary Tract: Normal adrenals. No hydronephrosis. Tiny
subcentimeter simple appearing renal cysts in the lower right and
upper left kidneys, which otherwise appear normal with no suspicious
renal masses.

Stomach/Bowel: Grossly normal stomach. Visualized small and large
bowel is normal caliber, with no bowel wall thickening. Re-
demonstrated is a 6.1 x 4.2 x 5.1 cm solid left mesenteric mass
(series 7/image 47) with mixed T2 signal intensity (series 7/image
47), previously 6.1 x 4.1 x 5.1 cm on 01/21/2017 CT, unchanged, and
new since 07/31/2008 CT. This mass appears intimately associated
with multiple left abdominal small bowel loops.

Vascular/Lymphatic: Normal caliber abdominal aorta. Otherwise no
pathologically enlarged abdominal lymph nodes.

Other: No abdominal ascites or focal fluid collection.

Musculoskeletal: No aggressive appearing focal osseous lesions.
IMPRESSION: 1. Despite the best efforts of the patient and MRI staff, the
examination is significantly limited by motion artifact and by
unsuccessful IV contrast administration.
2. Solid left mesenteric 6.1 x 4.2 x 5.1 cm mass, intimately
associated with multiple left abdominal small bowel loops, stable in
size since 01/21/2017 CT, new since 07/31/2008 CT. Differential
includes desmoid tumor, lymphoma or carcinoid. Small bowel
adenocarcinoma is less likely. PET-CT may be useful for further
characterization as clinically warranted.
3. Otherwise unremarkable MRI abdomen, with no evidence of bowel
obstruction.

## 2018-01-10 ENCOUNTER — Encounter (HOSPITAL_BASED_OUTPATIENT_CLINIC_OR_DEPARTMENT_OTHER): Payer: Self-pay

## 2018-01-10 ENCOUNTER — Other Ambulatory Visit: Payer: Self-pay

## 2018-01-10 ENCOUNTER — Inpatient Hospital Stay: Payer: Medicare Other

## 2018-01-10 ENCOUNTER — Encounter: Payer: Self-pay | Admitting: Family

## 2018-01-10 ENCOUNTER — Inpatient Hospital Stay: Payer: Medicare Other | Attending: Hematology & Oncology | Admitting: Family

## 2018-01-10 ENCOUNTER — Ambulatory Visit (HOSPITAL_BASED_OUTPATIENT_CLINIC_OR_DEPARTMENT_OTHER)
Admission: RE | Admit: 2018-01-10 | Discharge: 2018-01-10 | Disposition: A | Discharge status: Home / Self Care | Payer: Medicare Other | Source: Ambulatory Visit | Attending: Hematology & Oncology | Admitting: Hematology & Oncology

## 2018-01-10 VITALS — BP 145/82 | HR 60 | Temp 97.8°F | Resp 17 | Wt 159.4 lb

## 2018-01-10 DIAGNOSIS — R1909 Other intra-abdominal and pelvic swelling, mass and lump: Secondary | ICD-10-CM | POA: Insufficient documentation

## 2018-01-10 DIAGNOSIS — R1900 Intra-abdominal and pelvic swelling, mass and lump, unspecified site: Secondary | ICD-10-CM

## 2018-01-10 DIAGNOSIS — F0390 Unspecified dementia without behavioral disturbance: Secondary | ICD-10-CM | POA: Insufficient documentation

## 2018-01-10 DIAGNOSIS — R1907 Generalized intra-abdominal and pelvic swelling, mass and lump: Secondary | ICD-10-CM

## 2018-01-10 HISTORY — DX: Malignant (primary) neoplasm, unspecified: C80.1

## 2018-01-10 LAB — CBC WITH DIFFERENTIAL (CANCER CENTER ONLY)
Basophils Absolute: 0 10*3/uL (ref 0.0–0.1)
Basophils Relative: 0 %
EOS ABS: 0 10*3/uL (ref 0.0–0.5)
Eosinophils Relative: 0 %
HCT: 45.2 % (ref 34.8–46.6)
HEMOGLOBIN: 15.8 g/dL (ref 11.6–15.9)
LYMPHS ABS: 1.3 10*3/uL (ref 0.9–3.3)
LYMPHS PCT: 27 %
MCH: 32 pg (ref 26.0–34.0)
MCHC: 35 g/dL (ref 32.0–36.0)
MCV: 91.7 fL (ref 81.0–101.0)
Monocytes Absolute: 0.4 10*3/uL (ref 0.1–0.9)
Monocytes Relative: 8 %
NEUTROS PCT: 65 %
Neutro Abs: 3.2 10*3/uL (ref 1.5–6.5)
Platelet Count: 182 10*3/uL (ref 145–400)
RBC: 4.93 MIL/uL (ref 3.70–5.32)
RDW: 12.6 % (ref 11.1–15.7)
WBC: 4.9 10*3/uL (ref 3.9–10.0)

## 2018-01-10 LAB — CMP (CANCER CENTER ONLY)
ALT: 26 U/L (ref 10–47)
AST: 28 U/L (ref 11–38)
Albumin: 3.5 g/dL (ref 3.5–5.0)
Alkaline Phosphatase: 80 U/L (ref 26–84)
Anion gap: 6 (ref 5–15)
BUN: 9 mg/dL (ref 7–22)
CO2: 31 mmol/L (ref 18–33)
CREATININE: 0.7 mg/dL (ref 0.60–1.20)
Calcium: 9.6 mg/dL (ref 8.0–10.3)
Chloride: 106 mmol/L (ref 98–108)
Glucose, Bld: 90 mg/dL (ref 73–118)
POTASSIUM: 4 mmol/L (ref 3.3–4.7)
SODIUM: 143 mmol/L (ref 128–145)
TOTAL PROTEIN: 6.8 g/dL (ref 6.4–8.1)
Total Bilirubin: 1 mg/dL (ref 0.2–1.6)

## 2018-01-10 LAB — LACTATE DEHYDROGENASE: LDH: 202 U/L — ABNORMAL HIGH (ref 98–192)

## 2018-01-10 MED ORDER — IOPAMIDOL (ISOVUE-300) INJECTION 61%
100.0000 mL | Freq: Once | INTRAVENOUS | Status: AC | PRN
  Administered 2018-01-10: 100 mL via INTRAVENOUS

## 2018-01-10 NOTE — Progress Notes (Signed)
Hematology and Oncology Follow Up Visit  Valerie Chen [id number] December 05, 1928 84 y.o. 01/10/2018   Principle Diagnosis:  Left mesenteric mass  Current Therapy:   Observation   Interim History:  Valerie Chen is here today with her care giver for follow-up. She is doing well and has no complaints at this time. She does have significant dementia and is pleasantly confused. Some information was obtained from her care giver. 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  She is eating well and staying hydrated daily. Her weight is stable.  She denies fever, chills, n/v, cough, rash, dizziness, SOB, chest pain, palpitations, abdominal pain or changes in bowel or bladder habits.  No swelling, tenderness, numbness or tingling in her extremities. No c/o pain.  No lymphadenopathy noted on her exam. No episodes of bleeding, no bruising or petechiae.   ECOG Performance Status: 1 - Symptomatic but completely ambulatory  Medications:  Allergies as of 01/10/2018      Reactions   Levaquin [levofloxacin] Other (See Comments)   Altered mental status    Rocephin [ceftriaxone] Other (See Comments)   Uncontrollable chills/shaking, dizziness, and disorientation after receiving this (??)   Statins Other (See Comments)   Severe myalgias      Medication List        Accurate as of 01/10/18 10:00 AM. Always use your most recent med list.          acetaminophen 500 MG tablet Commonly known as:  TYLENOL Take 1,000 mg by mouth every 6 (six) hours as needed for moderate pain.   Melatonin 1 MG/ML Liqd Take 5 mLs by mouth. Daily at 6:00PM   trimethoprim 100 MG tablet Commonly known as:  TRIMPEX Take 1 tablet (100 mg total) by mouth daily.   UNABLE TO FIND Fern dophilus one capsule daily at [place] one multivitamin one daily at 9:00AM   vitamin B-12 500 MCG tablet Commonly known as:  CYANOCOBALAMIN Take 500  mcg by mouth daily.   Vitamin D-3 1000 units Caps Take 1,000 Units by mouth daily.       Allergies:  Allergies  Allergen Reactions  . Levaquin [Levofloxacin] Other (See Comments)    Altered mental status   . Rocephin [Ceftriaxone] Other (See Comments)    Uncontrollable chills/shaking, dizziness, and disorientation after receiving this (??)  . Statins Other (See Comments)    Severe myalgias    Past Medical History, Surgical history, Social history, and Family History were reviewed and updated.  Review of Systems: All other 10 point review of systems is negative.   Physical Exam:  weight is 159 lb 6.4 oz (72.3 kg). Her oral temperature is 97.8 F (36.6 C). Her blood pressure is 145/82 (abnormal) and her pulse is 60. Her respiration is 17 and oxygen saturation is 99%.   Wt Readings from Last 3 Encounters:  01/10/18 159 lb 6.4 oz (72.3 kg)  12/05/17 161 lb 3.2 oz (73.1 kg)  11/29/17 160 lb 3.2 oz (72.7 kg)    Ocular: Sclerae unicteric, pupils equal, round and reactive to light Ear-nose-throat: Oropharynx clear, dentition fair Lymphatic: No cervical, supraclavicular or axillary adenopathy Lungs no rales or rhonchi, good excursion bilaterally Heart regular rate and rhythm, no murmur appreciated Abd soft, nontender, positive bowel sounds, no liver or spleen tip palpated on exam, no fluid wave  MSK no focal spinal tenderness, no joint edema Neuro: non-focal, well-oriented, appropriate affect Breasts: Deferred  Lab Results  Component Value Date   WBC 4.9 01/10/2018   HGB 15.8 01/10/2018   HCT 45.2 01/10/2018   MCV 91.7 01/10/2018   PLT 182 01/10/2018   No results found for: FERRITIN, IRON, TIBC, UIBC, IRONPCTSAT Lab Results  Component Value Date   RBC 4.93 01/10/2018   No results found for: KPAFRELGTCHN, LAMBDASER, KAPLAMBRATIO No results found for: IGGSERUM, IGA, IGMSERUM No results found for: Odetta Pink,  SPEI   Chemistry      Component Value Date/Time   NA 143 01/10/2018 0821   NA 141 04/04/2017 1103   K 4.0 01/10/2018 0821   K 3.9 04/04/2017 1103   CL 106 01/10/2018 0821   CL 104 04/04/2017 1103   CO2 31 01/10/2018 0821   CO2 28 04/04/2017 1103   BUN 9 01/10/2018 0821   BUN 9 04/04/2017 1103   CREATININE 0.70 01/10/2018 0821   CREATININE 0.8 04/04/2017 1103      Component Value Date/Time   CALCIUM 9.6 01/10/2018 0821   CALCIUM 9.6 04/04/2017 1103   ALKPHOS 80 01/10/2018 0821   ALKPHOS 80 04/04/2017 1103   AST 28 01/10/2018 0821   ALT 26 01/10/2018 0821   ALT 27 04/04/2017 1103   BILITOT 1.0 01/10/2018 0821      Impression and Plan: Valerie Chen is a very pleasant 84 yo caucasian female with a left mesenteric mass which CT scan today showed to be enlarging. Results were not available until after they left today so I was able to call her care giver and go over them with her. Dr. Marin Olp would like to se them along with her daughter  to discuss further evaluation and treatment so they are coming in at 3:30 Thursday.  They will contact our office with any other questions or concerns and go to the ED in the event of an emergency.   Valerie Peace, NP 7/23/201910:00 AM

## 2018-01-12 ENCOUNTER — Other Ambulatory Visit: Payer: Self-pay

## 2018-01-12 ENCOUNTER — Encounter: Payer: Self-pay | Admitting: Hematology & Oncology

## 2018-01-12 ENCOUNTER — Inpatient Hospital Stay (HOSPITAL_BASED_OUTPATIENT_CLINIC_OR_DEPARTMENT_OTHER): Payer: Medicare Other | Admitting: Hematology & Oncology

## 2018-01-12 VITALS — BP 149/50 | HR 64 | Temp 98.2°F | Resp 16 | Wt 159.0 lb

## 2018-01-12 DIAGNOSIS — F0390 Unspecified dementia without behavioral disturbance: Secondary | ICD-10-CM

## 2018-01-12 DIAGNOSIS — C49A3 Gastrointestinal stromal tumor of small intestine: Secondary | ICD-10-CM

## 2018-01-12 DIAGNOSIS — R1909 Other intra-abdominal and pelvic swelling, mass and lump: Secondary | ICD-10-CM

## 2018-01-12 HISTORY — DX: Gastrointestinal stromal tumor of small intestine: C49.A3

## 2018-01-12 NOTE — Progress Notes (Signed)
Hematology and Oncology Follow Up Visit  Valerie Chen [id number] 08-05-1928 84 y.o. 01/12/2018   Principle Diagnosis:  Left mesenteric mass  Current Therapy:   Observation   Interim History:  Valerie Chen is here today with her care giver for follow-up. She is doing well and has no complaints at this time. She does have significant dementia and is pleasantly confused. Some information was obtained from her care giver. 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  This mass measures 7.8 x 4.7 cm.  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  Now that there is a likely diagnosis of GIST, I think that it is reasonable to start her on [place].  We could avoid a biopsy.  I think if Gleevec does work on this mass and we see shrinkage, then we can feel confident that this is a GIST.  Her daughter was listening on a cell phone.  I talked to her at length.  I explained what it [place] is and how it works.  It is a 400 mg dose.  It is done daily.  I think toxicity would be very tolerable.  She is going to talk with the family.  They will decide whether or not Valerie Chen will be on [place].  I know this is a tough decision given her dementia.  However, she seems to be in good physical shape so I think it would be worthwhile trying Gleevec so that we can see if there is a tumor response and if so, this will help prevent side effects down the road.  ECOG Performance Status: 1 - Symptomatic but completely ambulatory  Medications:  Allergies as of 01/12/2018      Reactions   Levaquin [levofloxacin] Other (See Comments)   Altered mental status    Rocephin [ceftriaxone] Other (See Comments)   Uncontrollable chills/shaking, dizziness, and disorientation after receiving this (??)   Statins Other (See Comments)   Severe myalgias      Medication List        Accurate as of 01/12/18  5:02 PM. Always use your most  recent med list.          acetaminophen 500 MG tablet Commonly known as:  TYLENOL Take 1,000 mg by mouth every 6 (six) hours as needed for moderate pain.   Melatonin 1 MG/ML Liqd Take 2.5 mLs by mouth. Daily at 6:00PM   UNABLE TO FIND Fern dophilus one capsule daily at [place] one multivitamin one daily at 9:00AM   vitamin B-12 500 MCG tablet Commonly known as:  CYANOCOBALAMIN Take 500 mcg by mouth daily.   Vitamin D-3 1000 units Caps Take 1,000 Units by mouth daily.       Allergies:  Allergies  Allergen Reactions  . Levaquin [Levofloxacin] Other (See Comments)    Altered mental status   . Rocephin [Ceftriaxone] Other (See Comments)    Uncontrollable chills/shaking, dizziness, and disorientation after receiving this (??)  . Statins Other (See Comments)    Severe myalgias    Past Medical History, Surgical history, Social history, and Family History were reviewed and updated.  Review of Systems: All other 10 point review of systems is negative.   Physical Exam:  weight is 159 lb (72.1 kg). Her oral temperature is 98.2 F (36.8 C). Her blood pressure is 149/50 (abnormal) and her pulse is 64. Her respiration is 16 and oxygen saturation is  97%.   Wt Readings from Last 3 Encounters:  01/12/18 159 lb (72.1 kg)  01/10/18 159 lb 6.4 oz (72.3 kg)  12/05/17 161 lb 3.2 oz (73.1 kg)    Ocular: Sclerae unicteric, pupils equal, round and reactive to light Ear-nose-throat: Oropharynx clear, dentition fair Lymphatic: No cervical, supraclavicular or axillary adenopathy Lungs no rales or rhonchi, good excursion bilaterally Heart regular rate and rhythm, no murmur appreciated Abd soft, nontender, positive bowel sounds, no liver or spleen tip palpated on exam, no fluid wave  MSK no focal spinal tenderness, no joint edema Neuro: non-focal, well-oriented, appropriate affect Breasts: Deferred   Lab Results  Component Value Date   WBC 4.9 01/10/2018    HGB 15.8 01/10/2018   HCT 45.2 01/10/2018   MCV 91.7 01/10/2018   PLT 182 01/10/2018   No results found for: FERRITIN, IRON, TIBC, UIBC, IRONPCTSAT Lab Results  Component Value Date   RBC 4.93 01/10/2018   No results found for: KPAFRELGTCHN, LAMBDASER, KAPLAMBRATIO No results found for: IGGSERUM, IGA, IGMSERUM No results found for: Odetta Pink, SPEI   Chemistry      Component Value Date/Time   NA 143 01/10/2018 0821   NA 141 04/04/2017 1103   K 4.0 01/10/2018 0821   K 3.9 04/04/2017 1103   CL 106 01/10/2018 0821   CL 104 04/04/2017 1103   CO2 31 01/10/2018 0821   CO2 28 04/04/2017 1103   BUN 9 01/10/2018 0821   BUN 9 04/04/2017 1103   CREATININE 0.70 01/10/2018 0821   CREATININE 0.8 04/04/2017 1103      Component Value Date/Time   CALCIUM 9.6 01/10/2018 0821   CALCIUM 9.6 04/04/2017 1103   ALKPHOS 80 01/10/2018 0821   ALKPHOS 80 04/04/2017 1103   AST 28 01/10/2018 0821   ALT 26 01/10/2018 0821   ALT 27 04/04/2017 1103   BILITOT 1.0 01/10/2018 0821      Impression and Plan: Valerie Chen is a very pleasant 84 yo caucasian female with a left mesenteric mass which CT scan today showed to be enlarging.  I have thought in the past that this is a GIST.  Hopefully, we will be right about this.  Again, her daughter is going to talk with the other family members and let us know if Ms. however will be placed onto [place].  If she is on Gleevec, then we will plan to see her back in 3 weeks so we can monitor her blood counts and any side effects.    Valerie Napoleon, MD 7/25/20195:02 PM

## 2018-01-25 ENCOUNTER — Telehealth: Payer: Self-pay | Admitting: Family Medicine

## 2018-01-25 DIAGNOSIS — R1900 Intra-abdominal and pelvic swelling, mass and lump, unspecified site: Secondary | ICD-10-CM

## 2018-01-25 NOTE — Telephone Encounter (Signed)
Patients daughter came in office in reference to checking on referral to surgeon for patient. Patients daughter request a call back due to not being able to get in my chart at this time. OK to leave message.

## 2018-01-25 NOTE — Telephone Encounter (Signed)
Left message on daughters cell phone that a referral has been placed to [facility]. They will give her a call to schedule.

## 2018-02-03 DIAGNOSIS — N390 Urinary tract infection, site not specified: Secondary | ICD-10-CM | POA: Diagnosis not present

## 2018-02-03 DIAGNOSIS — R350 Frequency of micturition: Secondary | ICD-10-CM | POA: Diagnosis not present

## 2018-02-03 DIAGNOSIS — A499 Bacterial infection, unspecified: Secondary | ICD-10-CM | POA: Diagnosis not present

## 2018-02-11 DIAGNOSIS — A499 Bacterial infection, unspecified: Secondary | ICD-10-CM | POA: Diagnosis not present

## 2018-02-11 DIAGNOSIS — R300 Dysuria: Secondary | ICD-10-CM | POA: Diagnosis not present

## 2018-02-11 DIAGNOSIS — N390 Urinary tract infection, site not specified: Secondary | ICD-10-CM | POA: Diagnosis not present

## 2018-03-03 DIAGNOSIS — N390 Urinary tract infection, site not specified: Secondary | ICD-10-CM | POA: Diagnosis not present

## 2018-03-03 DIAGNOSIS — R350 Frequency of micturition: Secondary | ICD-10-CM | POA: Diagnosis not present

## 2018-03-03 DIAGNOSIS — A499 Bacterial infection, unspecified: Secondary | ICD-10-CM | POA: Diagnosis not present

## 2018-03-07 ENCOUNTER — Ambulatory Visit (INDEPENDENT_AMBULATORY_CARE_PROVIDER_SITE_OTHER): Payer: Medicare Other | Admitting: Family Medicine

## 2018-03-07 ENCOUNTER — Encounter: Payer: Self-pay | Admitting: Family Medicine

## 2018-03-07 VITALS — BP 128/64 | HR 66 | Temp 97.7°F | Ht 62.0 in | Wt 159.8 lb

## 2018-03-07 DIAGNOSIS — F0280 Dementia in other diseases classified elsewhere without behavioral disturbance: Secondary | ICD-10-CM | POA: Diagnosis not present

## 2018-03-07 DIAGNOSIS — C49A3 Gastrointestinal stromal tumor of small intestine: Secondary | ICD-10-CM

## 2018-03-07 DIAGNOSIS — N390 Urinary tract infection, site not specified: Secondary | ICD-10-CM | POA: Diagnosis not present

## 2018-03-07 DIAGNOSIS — G309 Alzheimer's disease, unspecified: Secondary | ICD-10-CM | POA: Diagnosis not present

## 2018-03-07 MED ORDER — NITROFURANTOIN MONOHYD MACRO 100 MG PO CAPS: 100.0000 mg | ORAL_CAPSULE | Freq: Every day | ORAL | 2 refills | Status: DC

## 2018-03-07 NOTE — Progress Notes (Signed)
Valerie Chen is a 84 y.o. female is here for follow up.  History of Present Illness:  Shaune Pascal CMA acting as scribe for Dr. Juleen China.  HPI: Patient comes in today for her follow up. Patient has been seen twice in the last month for UTI. She is currently on Cipro for the second UTI. Did well while on Trimethiprime, but daughter stopped as she was concerned about confusion. UTI noticed due to urine odor.   Biopsy areas have healed well.  Eating well. Walking daily. BM x 2 daily. No abdominal pain.  Daughter wanted to mas evaluated by surgeon, so put in referral to surgery. Following up with Onc shortly after. Talked daughters out of trying homeopathic medicine at the last visit.   Health Maintenance Due  Topic Date Due  . INFLUENZA VACCINE  01/19/2018   Depression screen [facility] 2/9 06/02/2017 11/20/2014  Decreased Interest 0 0  Down, Depressed, Hopeless 0 0  PHQ - 2 Score 0 0   PMHx, SurgHx, SocialHx, FamHx, Medications, and Allergies were reviewed in the Visit Navigator and updated as appropriate.   Patient Active Problem List   Diagnosis Date Noted  . GIST (gastrointestinal stromal tumor) of small bowel, malignant ([place]) 01/12/2018  . Breast mass, right 11/29/2017  . Skin lesion of back 11/29/2017  . Difficulty walking 02/23/2017  . Dementia, previously on Seroquel, able to wean with family and caregiver support 01/21/2017  . Abdominal mass, monitored by [facility] 01/21/2017  . NSVT (nonsustained ventricular tachycardia) ([place]) 05/11/2016  . Recurrent UTI, on daily Trimethroprim 02/01/2015  . Essential hypertension, on no medications 05/30/2013  . Coronary artery disease   . Diastolic dysfunction   . TIA (transient ischemic attack), questionable, 07/2010, with negative carotid dopplers   . History of GI bleed, with hemorrhoids, last colonoscopy 2012 at time of bleed   . Dyslipidemia, no statin   . Arthralgia of multiple joints 05/22/2009  . Vitamin D deficiency 01/27/2009    Social History   Tobacco Use  . Smoking status: Never Smoker  . Smokeless tobacco: Never Used  Substance Use Topics  . Alcohol use: No  . Drug use: Yes    Comment: hx of valium in 1980's - had 2 overdoses per daughter   Current Medications and Allergies:   .  acetaminophen (TYLENOL) 500 MG tablet, Take 1,000 mg by mouth every 6 (six) hours as needed for moderate pain., Disp: , Rfl:  .  Cholecalciferol (VITAMIN D-3) 1000 units CAPS, Take 1,000 Units by mouth daily. , Disp: , Rfl:  .  cyanocobalamin 500 MCG tablet, Take 500 mcg by mouth daily. , Disp: , Rfl:  .  Melatonin 1 MG/ML LIQD, Take 2.5 mLs by mouth. Daily at 6:00PM , Disp: , Rfl:  .  UNABLE TO FIND, Fern dophilus one capsule daily at 9:00AM, Disp: , Rfl:  .  UNABLE TO FIND, Maxi one multivitamin one daily at 9:00AM, Disp: , Rfl:     Allergies  Allergen Reactions  . Levaquin [Levofloxacin] Other (See Comments)    Altered mental status   . Rocephin [Ceftriaxone] Other (See Comments)    Uncontrollable chills/shaking, dizziness, and disorientation after receiving this (??)  . Statins Other (See Comments)    Severe myalgias   Review of Systems   Pertinent items are noted in the HPI. Otherwise, ROS is negative.  Vitals:   Vitals:   03/07/18 1015  BP: 128/64  Pulse: 66  Temp: 97.7 F (36.5 C)  TempSrc: Oral  SpO2:  98%  Weight: 159 lb 12.8 oz (72.5 kg)  Height: 5\' 2"  (1.575 m)     Body mass index is 29.23 kg/m.  Physical Exam:   Physical Exam  Constitutional: She appears well-nourished.  HENT:  Head: Normocephalic and atraumatic.  Eyes: Pupils are equal, round, and reactive to light. EOM are normal.  Neck: Normal range of motion. Neck supple.  Cardiovascular: Normal rate, regular rhythm, normal heart sounds and intact distal pulses.  Pulmonary/Chest: Effort normal.  Abdominal: Soft.  Skin: Skin is warm.  Psychiatric: She has a normal mood and affect. Her behavior is normal.  Nursing note and vitals  reviewed.  Assessment and Plan:   Diagnoses and all orders for this visit:  Recurrent UTI -     nitrofurantoin, macrocrystal-monohydrate, (MACROBID) 100 MG capsule; Take 1 capsule (100 mg total) by mouth daily. -     Ambulatory referral to Urology  Alzheimer's dementia without behavioral disturbance, unspecified timing of dementia onset  GIST (gastrointestinal stromal tumor) of small bowel, malignant ([place])   . Reviewed expectations re: course of current medical issues. . Discussed self-management of symptoms. . Outlined signs and symptoms indicating need for more acute intervention. . Patient verbalized understanding and all questions were answered. Marland Kitchen Health Maintenance issues including appropriate healthy diet, exercise, and smoking avoidance were discussed with patient. . See orders for this visit as documented in the electronic medical record. . Patient received an After Visit Summary.  CMA served as Education administrator during this visit. History, Physical, and Plan performed by medical provider. The above documentation has been reviewed and is accurate and complete. Briscoe Deutscher, D.O.  Briscoe Deutscher, DO [place], [facility] 03/07/2018

## 2018-03-10 DIAGNOSIS — G309 Alzheimer's disease, unspecified: Secondary | ICD-10-CM | POA: Diagnosis not present

## 2018-03-10 DIAGNOSIS — K6389 Other specified diseases of intestine: Secondary | ICD-10-CM | POA: Diagnosis not present

## 2018-03-10 DIAGNOSIS — F0280 Dementia in other diseases classified elsewhere without behavioral disturbance: Secondary | ICD-10-CM | POA: Diagnosis not present

## 2018-03-13 ENCOUNTER — Other Ambulatory Visit: Payer: 59

## 2018-03-13 ENCOUNTER — Ambulatory Visit: Payer: 59 | Admitting: Hematology & Oncology

## 2018-03-29 DIAGNOSIS — N390 Urinary tract infection, site not specified: Secondary | ICD-10-CM | POA: Diagnosis not present

## 2018-03-29 DIAGNOSIS — G309 Alzheimer's disease, unspecified: Secondary | ICD-10-CM | POA: Diagnosis not present

## 2018-03-29 DIAGNOSIS — R300 Dysuria: Secondary | ICD-10-CM | POA: Diagnosis not present

## 2018-03-29 DIAGNOSIS — F0280 Dementia in other diseases classified elsewhere without behavioral disturbance: Secondary | ICD-10-CM | POA: Diagnosis not present

## 2018-04-02 ENCOUNTER — Emergency Department (HOSPITAL_COMMUNITY): Payer: Medicare Other

## 2018-04-02 ENCOUNTER — Encounter (HOSPITAL_COMMUNITY): Payer: Self-pay

## 2018-04-02 ENCOUNTER — Emergency Department (HOSPITAL_COMMUNITY)
Admission: EM | Admit: 2018-04-02 | Discharge: 2018-04-02 | Disposition: A | Discharge status: Home / Self Care | Payer: Medicare Other | Attending: Emergency Medicine | Admitting: Emergency Medicine

## 2018-04-02 DIAGNOSIS — I10 Essential (primary) hypertension: Secondary | ICD-10-CM | POA: Diagnosis not present

## 2018-04-02 DIAGNOSIS — F0280 Dementia in other diseases classified elsewhere without behavioral disturbance: Secondary | ICD-10-CM | POA: Insufficient documentation

## 2018-04-02 DIAGNOSIS — Z66 Do not resuscitate: Secondary | ICD-10-CM | POA: Diagnosis not present

## 2018-04-02 DIAGNOSIS — Z859 Personal history of malignant neoplasm, unspecified: Secondary | ICD-10-CM | POA: Diagnosis not present

## 2018-04-02 DIAGNOSIS — Z79899 Other long term (current) drug therapy: Secondary | ICD-10-CM | POA: Insufficient documentation

## 2018-04-02 DIAGNOSIS — G309 Alzheimer's disease, unspecified: Secondary | ICD-10-CM | POA: Diagnosis not present

## 2018-04-02 DIAGNOSIS — I2510 Atherosclerotic heart disease of native coronary artery without angina pectoris: Secondary | ICD-10-CM | POA: Insufficient documentation

## 2018-04-02 DIAGNOSIS — F329 Major depressive disorder, single episode, unspecified: Secondary | ICD-10-CM | POA: Diagnosis not present

## 2018-04-02 DIAGNOSIS — F0390 Unspecified dementia without behavioral disturbance: Secondary | ICD-10-CM | POA: Diagnosis not present

## 2018-04-02 DIAGNOSIS — I959 Hypotension, unspecified: Secondary | ICD-10-CM | POA: Diagnosis not present

## 2018-04-02 DIAGNOSIS — Z8673 Personal history of transient ischemic attack (TIA), and cerebral infarction without residual deficits: Secondary | ICD-10-CM | POA: Insufficient documentation

## 2018-04-02 DIAGNOSIS — R55 Syncope and collapse: Secondary | ICD-10-CM | POA: Diagnosis not present

## 2018-04-02 LAB — CBC WITH DIFFERENTIAL/PLATELET
Abs Immature Granulocytes: 0.01 10*3/uL (ref 0.00–0.07)
BASOS ABS: 0 10*3/uL (ref 0.0–0.1)
BASOS PCT: 0 %
EOS ABS: 0 10*3/uL (ref 0.0–0.5)
EOS PCT: 1 %
HEMATOCRIT: 45.4 % (ref 36.0–46.0)
Hemoglobin: 15.5 g/dL — ABNORMAL HIGH (ref 12.0–15.0)
Immature Granulocytes: 0 %
LYMPHS ABS: 2.7 10*3/uL (ref 0.7–4.0)
Lymphocytes Relative: 49 %
MCH: 31.3 pg (ref 26.0–34.0)
MCHC: 34.1 g/dL (ref 30.0–36.0)
MCV: 91.5 fL (ref 80.0–100.0)
MONOS PCT: 8 %
Monocytes Absolute: 0.5 10*3/uL (ref 0.1–1.0)
NEUTROS PCT: 42 %
NRBC: 0 % (ref 0.0–0.2)
Neutro Abs: 2.4 10*3/uL (ref 1.7–7.7)
PLATELETS: 191 10*3/uL (ref 150–400)
RBC: 4.96 MIL/uL (ref 3.87–5.11)
RDW: 12.4 % (ref 11.5–15.5)
WBC: 5.5 10*3/uL (ref 4.0–10.5)

## 2018-04-02 LAB — COMPREHENSIVE METABOLIC PANEL
ALT: 25 U/L (ref 0–44)
AST: 24 U/L (ref 15–41)
Albumin: 3.8 g/dL (ref 3.5–5.0)
Alkaline Phosphatase: 74 U/L (ref 38–126)
Anion gap: 13 (ref 5–15)
BILIRUBIN TOTAL: 0.8 mg/dL (ref 0.3–1.2)
BUN: 12 mg/dL (ref 8–23)
CHLORIDE: 102 mmol/L (ref 98–111)
CO2: 25 mmol/L (ref 22–32)
Calcium: 9.7 mg/dL (ref 8.9–10.3)
Creatinine, Ser: 0.82 mg/dL (ref 0.44–1.00)
GFR calc Af Amer: >60 mL/min (ref >60)
Glucose, Bld: 102 mg/dL — ABNORMAL HIGH (ref 70–99)
POTASSIUM: 3.6 mmol/L (ref 3.5–5.1)
Sodium: 140 mmol/L (ref 135–145)
TOTAL PROTEIN: 6.5 g/dL (ref 6.5–8.1)

## 2018-04-02 LAB — URINALYSIS, ROUTINE W REFLEX MICROSCOPIC
BILIRUBIN URINE: NEGATIVE
Glucose, UA: NEGATIVE mg/dL
HGB URINE DIPSTICK: NEGATIVE
Ketones, ur: NEGATIVE mg/dL
NITRITE: NEGATIVE
PH: 7 (ref 5.0–8.0)
Protein, ur: NEGATIVE mg/dL
SPECIFIC GRAVITY, URINE: 1.005 (ref 1.005–1.030)

## 2018-04-02 LAB — I-STAT TROPONIN, ED: TROPONIN I, POC: 0 ng/mL (ref 0.00–0.08)

## 2018-04-02 MED ORDER — CEPHALEXIN 500 MG PO CAPS: 500.0000 mg | ORAL_CAPSULE | Freq: Four times a day (QID) | ORAL | 0 refills | Status: DC

## 2018-04-02 MED ORDER — SODIUM CHLORIDE 0.9 % IV BOLUS
1000.0000 mL | Freq: Once | INTRAVENOUS | Status: AC
  Administered 2018-04-02: 1000 mL via INTRAVENOUS

## 2018-04-02 NOTE — ED Triage Notes (Signed)
Patient coming from home with c/o syncope.episode. Patient was in the bathroom having a BM went this happen. Pt alert and oriented to self. Hx Alzheimer/dementia

## 2018-04-02 NOTE — Discharge Instructions (Addendum)
Drink plenty of fluids and follow-up with your doctor later this week 

## 2018-04-02 NOTE — ED Notes (Signed)
Bed: WA20 Expected date:  Expected time:  Means of arrival:  Comments: 84 yo syncope

## 2018-04-02 NOTE — ED Provider Notes (Addendum)
[facility] DEPT Provider Note   CSN: [id number] Arrival date & time: 04/02/18  1825     History   Chief Complaint No chief complaint on file.   HPI Valerie Chen is a 84 y.o. female.  Patient passed on the commode.  Daughter also states that she been more confused lately and she is afraid she may have a urinary tract infection.  The history is provided by the patient and a relative. No language interpreter was used.  Illness  This is a recurrent problem. The current episode started 3 to 5 hours ago. The problem occurs rarely. The problem has been resolved. Pertinent negatives include no chest pain. Nothing aggravates the symptoms. Nothing relieves the symptoms. She has tried nothing for the symptoms. The treatment provided no relief.    Past Medical History:  Diagnosis Date  . Alzheimer disease ([place])   . Arthritis   . CAD (coronary artery disease)    Probable CAD, nuclear February, 2012, possible anteroseptal and inferoseptal ischemia but the study was technically difficult  . Cancer ([place])   . Chest pain    Hospital February, 2012, nuclear, possible anteroseptal and inferoseptal ischemia, technically difficult, medical therapy  . Dementia ([place])    Significant  . Diastolic dysfunction    Echo, February, 2012  . Diastolic dysfunction   . DNR (do not resuscitate)   . Dyslipidemia   . Edema    hospitalizations September, 2013, some fluid overload  . Ejection fraction    EF 55%, echo, February, 2012  //   EF 55-60%, echo, February 25, 2012 // Echo 12/17: EF 60-65, no RWMA, Gr 1 DD, normal RVSF  . Essential hypertension   . GI bleed    February, 2012 incomplete colonoscopy, hemorrhoids, needs complete colonoscopy  . GIST (gastrointestinal stromal tumor) of small bowel, malignant ([place]) 01/12/2018  . Hx: UTI (urinary tract infection)   . Hyperlipidemia   . Leg pain, bilateral   . Major depression    2 overdoses in 1975 & 1977  . Migraines      25 year  . Orthostatic hypotension   . Suicide attempt ([place]) 1985   2 attempts with Valium  . TIA (transient ischemic attack)       ???question of a TIA in February, 2012, carotid Doppler, August 11, 2010 no significant abnormalities    Patient Active Problem List   Diagnosis Date Noted  . GIST (gastrointestinal stromal tumor) of small bowel, malignant ([place]) 01/12/2018  . Breast mass, right 11/29/2017  . Skin lesion of back 11/29/2017  . Difficulty walking 02/23/2017  . Dementia, previously on Seroquel, able to wean with family and caregiver support 01/21/2017  . Abdominal mass, monitored by [facility] 01/21/2017  . NSVT (nonsustained ventricular tachycardia) ([place]) 05/11/2016  . Recurrent UTI, on daily Trimethroprim 02/01/2015  . Essential hypertension, on no medications 05/30/2013  . Coronary artery disease   . Diastolic dysfunction   . TIA (transient ischemic attack), questionable, 07/2010, with negative carotid dopplers   . History of GI bleed, with hemorrhoids, last colonoscopy 2012 at time of bleed   . Dyslipidemia, no statin   . Arthralgia of multiple joints 05/22/2009  . Vitamin D deficiency 01/27/2009    Past Surgical History:  Procedure Laterality Date  . ABDOMINAL HYSTERECTOMY    . DIAGNOSTIC MAMMOGRAM  12/29/2000   Mammogram, suspicious lesions -BX pending  . TONSILLECTOMY       OB History   None      Home  Medications    Prior to Admission medications   Medication Sig Start Date End Date Taking? Authorizing Provider  acetaminophen (TYLENOL) 500 MG tablet Take 1,000 mg by mouth every 6 (six) hours as needed for moderate pain.   Yes [provider]  Cholecalciferol (VITAMIN D-3) 1000 units CAPS Take 1,000 Units by mouth daily.    Yes [provider]  cyanocobalamin 500 MCG tablet Take 500 mcg by mouth daily.    Yes [provider]  Melatonin 1 MG/ML LIQD Take 2.5 mLs by mouth. Daily at 6:00PM    Yes [provider]   UNABLE TO [place] dophilus one capsule daily at 9:00AM   Yes [provider]  UNABLE TO [place] one multivitamin one daily at 9:00AM   Yes [provider]  UNABLE TO FIND Take 1 Scoop by mouth daily. D-mannose , Bladder and UTI support-- add 1 scoop to 6 oz of water   Yes [provider]  cephALEXin (KEFLEX) 500 MG capsule Take 1 capsule (500 mg total) by mouth 4 (four) times daily. 04/02/18   Milton Ferguson, MD  nitrofurantoin, macrocrystal-monohydrate, (MACROBID) 100 MG capsule Take 1 capsule (100 mg total) by mouth daily. Patient not taking: Reported on 04/02/2018 03/07/18   Briscoe Deutscher, DO    Family History Family History  Problem Relation Age of Onset  . Heart attack Father        several heart attacks  . Cancer Father        bone  . Arthritis Father   . Suicidality Mother   . Depression Mother   . Heart attack Brother   . Heart disease Brother   . Pneumonia Brother   . Suicidality Son   . Hypertension Daughter   . Depression Other        Most family members    Social History Social History   Tobacco Use  . Smoking status: Never Smoker  . Smokeless tobacco: Never Used  Substance Use Topics  . Alcohol use: No  . Drug use: Yes    Comment: hx of valium in 1980's - had 2 overdoses per daughter     Allergies   Levaquin [levofloxacin]; Rocephin [ceftriaxone]; and Statins   Review of Systems Review of Systems  Unable to perform ROS: Dementia  Cardiovascular: Negative for chest pain.     Physical Exam Updated Vital Signs BP (!) 115/57   Pulse 60   Temp (!) 97.4 F (36.3 C) (Axillary)   Resp (!) 24   SpO2 96%   Physical Exam  Constitutional: She appears well-developed.  HENT:  Head: Normocephalic.  Eyes: Conjunctivae and EOM are normal. No scleral icterus.  Neck: Neck supple. No thyromegaly present.  Cardiovascular: Normal rate and regular rhythm. Exam reveals no gallop and no friction rub.  No murmur  heard. Pulmonary/Chest: No stridor. She has no wheezes. She has no rales. She exhibits no tenderness.  Abdominal: She exhibits no distension. There is no tenderness. There is no rebound.  Musculoskeletal: Normal range of motion. She exhibits no edema.  Lymphadenopathy:    She has no cervical adenopathy.  Neurological: She is alert. She exhibits normal muscle tone. Coordination normal.  Oriented to person only  Skin: No rash noted. No erythema.     ED Treatments / Results  Labs (all labs ordered are listed, but only abnormal results are displayed) Labs Reviewed  CBC WITH DIFFERENTIAL/PLATELET - Abnormal; Notable for the following components:      Result Value  Hemoglobin 15.5 (*)    All other components within normal limits  COMPREHENSIVE METABOLIC PANEL - Abnormal; Notable for the following components:   Glucose, Bld 102 (*)    All other components within normal limits  URINALYSIS, ROUTINE W REFLEX MICROSCOPIC - Abnormal; Notable for the following components:   Leukocytes, UA SMALL (*)    Bacteria, UA FEW (*)    All other components within normal limits  URINE CULTURE  I-STAT TROPONIN, ED    EKG None  Radiology Dg Chest 2 View  Result Date: 04/02/2018 CLINICAL DATA:  Patient with syncopal episode. EXAM: CHEST - 2 VIEW COMPARISON:  Chest radiograph 06/02/2017 FINDINGS: Normal cardiac and mediastinal contours. No consolidative pulmonary opacities. No pleural effusion or pneumothorax. No aggressive or acute appearing osseous lesions. IMPRESSION: No active cardiopulmonary disease. Electronically Signed   By: Lovey Newcomer M.D.   On: 04/02/2018 20:47   Ct Head Wo Contrast  Result Date: 04/02/2018 CLINICAL DATA:  Syncopal episode. EXAM: CT HEAD WITHOUT CONTRAST TECHNIQUE: Contiguous axial images were obtained from the base of the skull through the vertex without intravenous contrast. COMPARISON:  05/04/2017 FINDINGS: Brain: Chronic cerebral atrophy with moderate small vessel  ischemia. No acute intracranial hemorrhage, midline shift or edema. No intra-axial mass nor extra-axial fluid. Vascular: Atherosclerosis of the carotid siphons. Skull: No acute skull fracture Sinuses/Orbits: Mild-to-moderate ethmoid sinus mucosal thickening with small left ethmoid sinus osteoma. Intact orbits and globes. Other: None IMPRESSION: Atrophy with moderate small vessel ischemia. No acute intracranial abnormality. No significant change. Electronically Signed   By: Ashley Royalty M.D.   On: 04/02/2018 19:37    Procedures Procedures (including critical care time)  Medications Ordered in ED Medications  sodium chloride 0.9 % bolus 1,000 mL (0 mLs Intravenous Stopped 04/02/18 2001)    EKG Interpretation  Date/Time:    Ventricular Rate:    PR Interval:    QRS Duration:   QT Interval:    QTC Calculation:   R Axis:     Text Interpretation:          Initial Impression / Assessment and Plan / ED Course  I have reviewed the triage vital signs and the nursing notes.  Pertinent labs & imaging results that were available during my care of the patient were reviewed by me and considered in my medical decision making (see chart for details).    Patient with syncope while on the commode.  Labs unremarkable x-rays unremarkable including CT scan.  Urine shows possible urinary tract infection.  She will be empirically treated with Keflex and have urine culture done and will follow-up with PCP  Final Clinical Impressions(s) / ED Diagnoses   Final diagnoses:  Syncope and collapse    ED Discharge Orders         Ordered    cephALEXin (KEFLEX) 500 MG capsule  4 times daily     04/02/18 2147           Milton Ferguson, MD 04/02/18 2150    Milton Ferguson, MD 04/18/18 [phone]

## 2018-04-04 DIAGNOSIS — Z87440 Personal history of urinary (tract) infections: Secondary | ICD-10-CM | POA: Diagnosis not present

## 2018-04-04 DIAGNOSIS — N3942 Incontinence without sensory awareness: Secondary | ICD-10-CM | POA: Diagnosis not present

## 2018-04-04 DIAGNOSIS — N952 Postmenopausal atrophic vaginitis: Secondary | ICD-10-CM | POA: Diagnosis not present

## 2018-04-04 LAB — URINE CULTURE

## 2018-04-12 ENCOUNTER — Ambulatory Visit (INDEPENDENT_AMBULATORY_CARE_PROVIDER_SITE_OTHER): Payer: Medicare Other

## 2018-04-12 DIAGNOSIS — Z23 Encounter for immunization: Secondary | ICD-10-CM

## 2018-06-04 DIAGNOSIS — Z888 Allergy status to other drugs, medicaments and biological substances status: Secondary | ICD-10-CM | POA: Diagnosis not present

## 2018-06-04 DIAGNOSIS — J069 Acute upper respiratory infection, unspecified: Secondary | ICD-10-CM | POA: Diagnosis not present

## 2018-06-04 DIAGNOSIS — Z79899 Other long term (current) drug therapy: Secondary | ICD-10-CM | POA: Diagnosis not present

## 2018-06-04 DIAGNOSIS — R05 Cough: Secondary | ICD-10-CM | POA: Diagnosis not present

## 2018-06-04 DIAGNOSIS — F0390 Unspecified dementia without behavioral disturbance: Secondary | ICD-10-CM | POA: Diagnosis not present

## 2018-06-04 DIAGNOSIS — I4891 Unspecified atrial fibrillation: Secondary | ICD-10-CM | POA: Diagnosis not present

## 2018-06-04 DIAGNOSIS — R9431 Abnormal electrocardiogram [ECG] [EKG]: Secondary | ICD-10-CM | POA: Diagnosis not present

## 2018-06-04 DIAGNOSIS — E785 Hyperlipidemia, unspecified: Secondary | ICD-10-CM | POA: Diagnosis not present

## 2018-06-04 DIAGNOSIS — Z7982 Long term (current) use of aspirin: Secondary | ICD-10-CM | POA: Diagnosis not present

## 2018-06-04 DIAGNOSIS — F329 Major depressive disorder, single episode, unspecified: Secondary | ICD-10-CM | POA: Diagnosis not present

## 2018-06-04 DIAGNOSIS — Z883 Allergy status to other anti-infective agents status: Secondary | ICD-10-CM | POA: Diagnosis not present

## 2018-06-04 DIAGNOSIS — J439 Emphysema, unspecified: Secondary | ICD-10-CM | POA: Diagnosis not present

## 2018-06-05 NOTE — Progress Notes (Signed)
Valerie Chen is a 84 y.o. female is here for follow up.  History of Present Illness:   Valerie Chen, CMA acting as scribe for Dr. Briscoe Deutscher.   HPI: Patient in office for evaluation of cough. She has had cough for 6 days, is productive, with some noted wheezing. She has not had any fever, nausea or vomiting. Hx of PNA without other symptoms. Hx of frequent UTIs. Mental status at baseline. Daughter with her today since CNA is also sick. Saw surgeon, recommended no intervention of mass and said not concerned about future obstruction. Will monitor q 6 months with scan, but no intervention.   Depression screen [facility] 2/9 06/02/2017 11/20/2014  Decreased Interest 0 0  Down, Depressed, Hopeless 0 0  PHQ - 2 Score 0 0   PMHx, SurgHx, SocialHx, FamHx, Medications, and Allergies were reviewed in the Visit Navigator and updated as appropriate.   Patient Active Problem List   Diagnosis Date Noted  . GIST (gastrointestinal stromal tumor) of small bowel, malignant ([place]) 01/12/2018  . Breast mass, right 11/29/2017  . Skin lesion of back 11/29/2017  . Difficulty walking 02/23/2017  . Dementia, previously on Seroquel, able to wean with family and caregiver support 01/21/2017  . Abdominal mass, monitored by [facility] 01/21/2017  . NSVT (nonsustained ventricular tachycardia) ([place]) 05/11/2016  . Recurrent UTI, on daily Trimethroprim 02/01/2015  . Essential hypertension, on no medications 05/30/2013  . Coronary artery disease   . Diastolic dysfunction   . TIA (transient ischemic attack), questionable, 07/2010, with negative carotid dopplers   . History of GI bleed, with hemorrhoids, last colonoscopy 2012 at time of bleed   . Dyslipidemia, no statin   . Arthralgia of multiple joints 05/22/2009  . Vitamin D deficiency 01/27/2009   Social History   Tobacco Use  . Smoking status: Never Smoker  . Smokeless tobacco: Never Used  Substance Use Topics  . Alcohol use: No  . Drug use: Yes    Comment:  hx of valium in 1980's - had 2 overdoses per daughter   Current Medications and Allergies:   .  acetaminophen (TYLENOL) 500 MG tablet, Take 1,000 mg by mouth every 6 (six) hours as needed for moderate pain., Disp: , Rfl:  .  Cholecalciferol (VITAMIN D-3) 1000 units CAPS, Take 1,000 Units by mouth daily. , Disp: , Rfl:  .  cyanocobalamin 500 MCG tablet, Take 500 mcg by mouth daily. , Disp: , Rfl:  .  Melatonin 1 MG/ML LIQD, Take 2.5 mLs by mouth. Daily at 6:00PM , Disp: , Rfl:  .  nitrofurantoin, macrocrystal-monohydrate, (MACROBID) 100 MG capsule, Take 1 capsule (100 mg total) by mouth daily. (Patient not taking: Reported on 04/02/2018), Disp: 30 capsule, Rfl: 2 .  UNABLE TO FIND, Fern dophilus one capsule daily at 9:00AM, Disp: , Rfl:  .  UNABLE TO FIND, Maxi one multivitamin one daily at 9:00AM, Disp: , Rfl:  .  UNABLE TO FIND, Take 1 Scoop by mouth daily. D-mannose , Bladder and UTI support-- add 1 scoop to 6 oz of water, Disp: , Rfl:    Allergies  Allergen Reactions  . Levaquin [Levofloxacin] Other (See Comments)    Altered mental status   . Rocephin [Ceftriaxone] Other (See Comments)    Uncontrollable chills/shaking, dizziness, and disorientation after receiving this (??)  . Statins Other (See Comments)    Severe myalgias   Review of Systems   Pertinent items are noted in the HPI. Otherwise, a complete ROS is negative.  Vitals:  Vitals:   06/06/18 1400  BP: 136/74  Pulse: 91  Temp: 98.6 F (37 C)  TempSrc: Oral  SpO2: 96%  Weight: 160 lb (72.6 kg)  Height: 5\' 2"  (1.575 m)     Body mass index is 29.26 kg/m.  Physical Exam:   Physical Exam Vitals signs and nursing note reviewed.  HENT:     Head: Normocephalic and atraumatic.  Eyes:     Pupils: Pupils are equal, round, and reactive to light.  Neck:     Musculoskeletal: Normal range of motion and neck supple.  Cardiovascular:     Rate and Rhythm: Normal rate and regular rhythm.     Heart sounds: Normal heart  sounds.  Pulmonary:     Effort: Pulmonary effort is normal. No tachypnea.     Breath sounds: Examination of the right-middle field reveals rhonchi. Wheezing and rhonchi present.  Abdominal:     Palpations: Abdomen is soft.  Skin:    General: Skin is warm.  Psychiatric:        Behavior: Behavior normal.    Results for orders placed or performed in visit on 06/06/18  POCT Urinalysis Dipstick (Automated)  Result Value Ref Range   Color, UA yellow    Clarity, UA clear    Glucose, UA Negative Negative   Bilirubin, UA Negative    Ketones, UA Negative    Spec Grav, UA 1.010 1.010 - 1.025   Blood, UA Negative    pH, UA 6.0 5.0 - 8.0   Protein, UA Negative Negative   Urobilinogen, UA 0.2 0.2 or 1.0 E.U./dL   Nitrite, UA Negative    Leukocytes, UA Negative Negative   Assessment and Plan:   Valerie Chen was seen today for follow-up.  Diagnoses and all orders for this visit:  Frequent urination Comments: POC UA clear, but patient on daily Macrobid. Will add UCX. Orders: -     POCT Urinalysis Dipstick (Automated) -     Comprehensive metabolic panel -     Urine Culture  Cough Comments: Concern for PNA. Will treat. See orders. Nebulizer in office with some relief of symptoms. Orders: -     CBC with Differential/Platelet -     DG Chest 2 View -     azithromycin (ZITHROMAX) 250 MG tablet; 2 po on day one, then one po each day after. -     benzonatate (TESSALON) 200 MG capsule; Take 1 capsule (200 mg total) by mouth 2 (two) times daily as needed for cough. -     predniSONE (DELTASONE) 5 MG tablet; 6-5-4-3-2-1-off  Recurrent UTI, on daily Trimethroprim -     Urine Culture   . Orders and follow up as documented in [place], reviewed diet, exercise and weight control, cardiovascular risk and specific lipid/LDL goals reviewed, reviewed medications and side effects in detail.  . Reviewed expectations re: course of current medical issues. . Outlined signs and symptoms indicating need for  more acute intervention. . Patient verbalized understanding and all questions were answered. . Patient received an After Visit Summary.  CMA served as Education administrator during this visit. History, Physical, and Plan performed by medical provider. The above documentation has been reviewed and is accurate and complete. Briscoe Deutscher, D.O.  Briscoe Deutscher, DO [place], [facility] 06/07/2018

## 2018-06-06 ENCOUNTER — Ambulatory Visit (INDEPENDENT_AMBULATORY_CARE_PROVIDER_SITE_OTHER): Payer: Medicare Other | Admitting: Family Medicine

## 2018-06-06 ENCOUNTER — Telehealth: Payer: Self-pay | Admitting: Family Medicine

## 2018-06-06 ENCOUNTER — Encounter: Payer: Self-pay | Admitting: Family Medicine

## 2018-06-06 ENCOUNTER — Ambulatory Visit: Payer: 59 | Admitting: Family Medicine

## 2018-06-06 ENCOUNTER — Ambulatory Visit (INDEPENDENT_AMBULATORY_CARE_PROVIDER_SITE_OTHER): Payer: Medicare Other

## 2018-06-06 VITALS — BP 136/74 | HR 91 | Temp 98.6°F | Ht 62.0 in | Wt 160.0 lb

## 2018-06-06 DIAGNOSIS — R05 Cough: Secondary | ICD-10-CM

## 2018-06-06 DIAGNOSIS — R350 Frequency of micturition: Secondary | ICD-10-CM

## 2018-06-06 DIAGNOSIS — N390 Urinary tract infection, site not specified: Secondary | ICD-10-CM | POA: Diagnosis not present

## 2018-06-06 DIAGNOSIS — R059 Cough, unspecified: Secondary | ICD-10-CM

## 2018-06-06 DIAGNOSIS — R0602 Shortness of breath: Secondary | ICD-10-CM | POA: Diagnosis not present

## 2018-06-06 LAB — POC URINALSYSI DIPSTICK (AUTOMATED)
Bilirubin, UA: NEGATIVE
Blood, UA: NEGATIVE
Glucose, UA: NEGATIVE
Ketones, UA: NEGATIVE
Leukocytes, UA: NEGATIVE
Nitrite, UA: NEGATIVE
Protein, UA: NEGATIVE
Spec Grav, UA: 1.01 (ref 1.010–1.025)
Urobilinogen, UA: 0.2 E.U./dL
pH, UA: 6 (ref 5.0–8.0)

## 2018-06-06 MED ORDER — AZITHROMYCIN 250 MG PO TABS: ORAL_TABLET | ORAL | 0 refills | Status: DC

## 2018-06-06 MED ORDER — GENERIC EXTERNAL MEDICATION
10.00 | Status: DC
Start: ? — End: 2018-06-06

## 2018-06-06 MED ORDER — PREDNISONE 5 MG PO TABS: ORAL_TABLET | ORAL | 0 refills | Status: DC

## 2018-06-06 MED ORDER — SODIUM CHLORIDE 0.9 % IV SOLN
10.00 | INTRAVENOUS | Status: DC
Start: ? — End: 2018-06-06

## 2018-06-06 MED ORDER — BENZONATATE 200 MG PO CAPS: 200.0000 mg | ORAL_CAPSULE | Freq: Two times a day (BID) | ORAL | 0 refills | Status: DC | PRN

## 2018-06-06 NOTE — Telephone Encounter (Signed)
See note  Copied from [place] [phone]. Topic: General - Other >> Jun 06, 2018  3:32 PM Windy Kalata wrote: Reason for CRM: Patients mom, Rolm Bookbinder is calling asking for a note for work to be out to take care of her mom for yesterday, today and through Thursday if needed, her mom's CNA is out sick and she needs to stay home and take care of her, please advise. She needs to be able to pick it up by tomorrow or they won't let her work.   Best call back is [phone]

## 2018-06-06 NOTE — Telephone Encounter (Signed)
Left message to return call to our office.  Need to know if note needs to return on Friday or Thursday?  Ok to give note?

## 2018-06-06 NOTE — Telephone Encounter (Signed)
Letter printed and at reception  Daughter informed

## 2018-06-06 NOTE — Telephone Encounter (Signed)
Okay for note

## 2018-06-07 ENCOUNTER — Encounter: Payer: Self-pay | Admitting: Family Medicine

## 2018-06-07 DIAGNOSIS — R05 Cough: Secondary | ICD-10-CM | POA: Diagnosis not present

## 2018-06-07 DIAGNOSIS — N390 Urinary tract infection, site not specified: Secondary | ICD-10-CM | POA: Diagnosis not present

## 2018-06-07 LAB — COMPREHENSIVE METABOLIC PANEL
ALT: 23 U/L (ref 0–35)
AST: 20 U/L (ref 0–37)
Albumin: 3.9 g/dL (ref 3.5–5.2)
Alkaline Phosphatase: 77 U/L (ref 39–117)
BUN: 10 mg/dL (ref 6–23)
CO2: 27 mEq/L (ref 19–32)
Calcium: 8.9 mg/dL (ref 8.4–10.5)
Chloride: 105 mEq/L (ref 96–112)
Creatinine, Ser: 1.01 mg/dL (ref 0.40–1.20)
GFR: 55.02 mL/min — ABNORMAL LOW (ref >60.00)
Glucose, Bld: 93 mg/dL (ref 70–99)
Potassium: 4.8 mEq/L (ref 3.5–5.1)
Sodium: 140 mEq/L (ref 135–145)
Total Bilirubin: 0.4 mg/dL (ref 0.2–1.2)
Total Protein: 6 g/dL (ref 6.0–8.3)

## 2018-06-07 LAB — CBC WITH DIFFERENTIAL/PLATELET
Basophils Absolute: 0 10*3/uL (ref 0.0–0.1)
Basophils Relative: 0.9 % (ref 0.0–3.0)
Eosinophils Absolute: 0 10*3/uL (ref 0.0–0.7)
Eosinophils Relative: 0 % (ref 0.0–5.0)
HCT: 45.2 % (ref 36.0–46.0)
Hemoglobin: 15.4 g/dL — ABNORMAL HIGH (ref 12.0–15.0)
Lymphocytes Relative: 35.7 % (ref 12.0–46.0)
Lymphs Abs: 1.3 10*3/uL (ref 0.7–4.0)
MCHC: 34.1 g/dL (ref 30.0–36.0)
MCV: 92.8 fl (ref 78.0–100.0)
Monocytes Absolute: 0.5 10*3/uL (ref 0.1–1.0)
Monocytes Relative: 12 % (ref 3.0–12.0)
Neutro Abs: 1.9 10*3/uL (ref 1.4–7.7)
Neutrophils Relative %: 51.4 % (ref 43.0–77.0)
Platelets: 162 10*3/uL (ref 150.0–400.0)
RBC: 4.87 Mil/uL (ref 3.87–5.11)
RDW: 13.2 % (ref 11.5–15.5)
WBC: 3.8 10*3/uL — ABNORMAL LOW (ref 4.0–10.5)

## 2018-06-07 MED ORDER — LEVALBUTEROL HCL 1.25 MG/3ML IN NEBU
1.2500 mg | INHALATION_SOLUTION | Freq: Once | RESPIRATORY_TRACT | Status: AC
  Administered 2018-06-07: 1.25 mg via RESPIRATORY_TRACT

## 2018-06-07 NOTE — Addendum Note (Signed)
Addended by: Francella Solian on: 06/07/2018 08:48 AM   Modules accepted: Orders

## 2018-06-08 LAB — URINE CULTURE
MICRO NUMBER:: 91508782
SPECIMEN QUALITY:: ADEQUATE

## 2018-06-09 ENCOUNTER — Encounter (HOSPITAL_COMMUNITY): Payer: Self-pay | Admitting: Family Medicine

## 2018-06-09 ENCOUNTER — Ambulatory Visit (INDEPENDENT_AMBULATORY_CARE_PROVIDER_SITE_OTHER): Payer: Medicare Other | Admitting: Physician Assistant

## 2018-06-09 ENCOUNTER — Observation Stay (HOSPITAL_COMMUNITY)
Admission: EM | Admit: 2018-06-09 | Discharge: 2018-06-10 | Disposition: A | Discharge status: Home / Self Care | Payer: Medicare Other | Attending: Internal Medicine | Admitting: Internal Medicine

## 2018-06-09 ENCOUNTER — Other Ambulatory Visit: Payer: Self-pay

## 2018-06-09 ENCOUNTER — Encounter: Payer: Self-pay | Admitting: Physician Assistant

## 2018-06-09 ENCOUNTER — Ambulatory Visit (INDEPENDENT_AMBULATORY_CARE_PROVIDER_SITE_OTHER): Payer: Medicare Other

## 2018-06-09 VITALS — BP 122/70 | HR 80 | Temp 98.2°F | Ht 62.0 in | Wt 161.0 lb

## 2018-06-09 DIAGNOSIS — I4891 Unspecified atrial fibrillation: Secondary | ICD-10-CM

## 2018-06-09 DIAGNOSIS — Z881 Allergy status to other antibiotic agents status: Secondary | ICD-10-CM | POA: Diagnosis not present

## 2018-06-09 DIAGNOSIS — J069 Acute upper respiratory infection, unspecified: Secondary | ICD-10-CM | POA: Diagnosis not present

## 2018-06-09 DIAGNOSIS — F0280 Dementia in other diseases classified elsewhere without behavioral disturbance: Secondary | ICD-10-CM | POA: Diagnosis not present

## 2018-06-09 DIAGNOSIS — N390 Urinary tract infection, site not specified: Secondary | ICD-10-CM | POA: Diagnosis present

## 2018-06-09 DIAGNOSIS — F0281 Dementia in other diseases classified elsewhere with behavioral disturbance: Secondary | ICD-10-CM

## 2018-06-09 DIAGNOSIS — F0390 Unspecified dementia without behavioral disturbance: Secondary | ICD-10-CM | POA: Diagnosis present

## 2018-06-09 DIAGNOSIS — Z8673 Personal history of transient ischemic attack (TIA), and cerebral infarction without residual deficits: Secondary | ICD-10-CM | POA: Insufficient documentation

## 2018-06-09 DIAGNOSIS — I10 Essential (primary) hypertension: Secondary | ICD-10-CM | POA: Insufficient documentation

## 2018-06-09 DIAGNOSIS — R0602 Shortness of breath: Secondary | ICD-10-CM

## 2018-06-09 DIAGNOSIS — Z79899 Other long term (current) drug therapy: Secondary | ICD-10-CM | POA: Diagnosis not present

## 2018-06-09 DIAGNOSIS — C49A3 Gastrointestinal stromal tumor of small intestine: Secondary | ICD-10-CM | POA: Diagnosis present

## 2018-06-09 DIAGNOSIS — G309 Alzheimer's disease, unspecified: Secondary | ICD-10-CM | POA: Diagnosis not present

## 2018-06-09 DIAGNOSIS — R55 Syncope and collapse: Secondary | ICD-10-CM

## 2018-06-09 DIAGNOSIS — Z888 Allergy status to other drugs, medicaments and biological substances status: Secondary | ICD-10-CM | POA: Insufficient documentation

## 2018-06-09 DIAGNOSIS — R0902 Hypoxemia: Secondary | ICD-10-CM | POA: Diagnosis not present

## 2018-06-09 DIAGNOSIS — I2510 Atherosclerotic heart disease of native coronary artery without angina pectoris: Secondary | ICD-10-CM | POA: Diagnosis not present

## 2018-06-09 DIAGNOSIS — R404 Transient alteration of awareness: Secondary | ICD-10-CM | POA: Diagnosis not present

## 2018-06-09 DIAGNOSIS — J40 Bronchitis, not specified as acute or chronic: Secondary | ICD-10-CM | POA: Diagnosis not present

## 2018-06-09 LAB — PROTIME-INR
INR: 0.92
Prothrombin Time: 12.3 seconds (ref 11.4–15.2)

## 2018-06-09 LAB — CBC WITH DIFFERENTIAL/PLATELET
ABS IMMATURE GRANULOCYTES: 0.01 10*3/uL (ref 0.00–0.07)
Basophils Absolute: 0 10*3/uL (ref 0.0–0.1)
Basophils Relative: 1 %
Eosinophils Absolute: 0 10*3/uL (ref 0.0–0.5)
Eosinophils Relative: 1 %
HCT: 48.3 % — ABNORMAL HIGH (ref 36.0–46.0)
Hemoglobin: 15.9 g/dL — ABNORMAL HIGH (ref 12.0–15.0)
Immature Granulocytes: 0 %
Lymphocytes Relative: 31 %
Lymphs Abs: 1.6 10*3/uL (ref 0.7–4.0)
MCH: 30.6 pg (ref 26.0–34.0)
MCHC: 32.9 g/dL (ref 30.0–36.0)
MCV: 92.9 fL (ref 80.0–100.0)
Monocytes Absolute: 0.4 10*3/uL (ref 0.1–1.0)
Monocytes Relative: 8 %
NEUTROS ABS: 3.2 10*3/uL (ref 1.7–7.7)
Neutrophils Relative %: 59 %
Platelets: 150 10*3/uL (ref 150–400)
RBC: 5.2 MIL/uL — ABNORMAL HIGH (ref 3.87–5.11)
RDW: 12.9 % (ref 11.5–15.5)
WBC: 5.2 10*3/uL (ref 4.0–10.5)
nRBC: 0 % (ref 0.0–0.2)

## 2018-06-09 LAB — TSH: TSH: 1.446 u[IU]/mL (ref 0.350–4.500)

## 2018-06-09 LAB — I-STAT TROPONIN, ED: Troponin i, poc: 0.01 ng/mL (ref 0.00–0.08)

## 2018-06-09 LAB — I-STAT CHEM 8, ED
BUN: 11 mg/dL (ref 8–23)
Calcium, Ion: 1.13 mmol/L — ABNORMAL LOW (ref 1.15–1.40)
Chloride: 106 mmol/L (ref 98–111)
Creatinine, Ser: 0.7 mg/dL (ref 0.44–1.00)
Glucose, Bld: 96 mg/dL (ref 70–99)
HCT: 39 % (ref 36.0–46.0)
Hemoglobin: 13.3 g/dL (ref 12.0–15.0)
Potassium: 3.9 mmol/L (ref 3.5–5.1)
Sodium: 140 mmol/L (ref 135–145)
TCO2: 25 mmol/L (ref 22–32)

## 2018-06-09 LAB — URINALYSIS, ROUTINE W REFLEX MICROSCOPIC
BILIRUBIN URINE: NEGATIVE
GLUCOSE, UA: NEGATIVE mg/dL
Hgb urine dipstick: NEGATIVE
Ketones, ur: NEGATIVE mg/dL
Leukocytes, UA: NEGATIVE
Nitrite: NEGATIVE
Protein, ur: NEGATIVE mg/dL
Specific Gravity, Urine: 1.005 (ref 1.005–1.030)
pH: 7 (ref 5.0–8.0)

## 2018-06-09 LAB — BASIC METABOLIC PANEL
Anion gap: 12 (ref 5–15)
BUN: 13 mg/dL (ref 8–23)
CHLORIDE: 105 mmol/L (ref 98–111)
CO2: 22 mmol/L (ref 22–32)
Calcium: 9.2 mg/dL (ref 8.9–10.3)
Creatinine, Ser: 0.86 mg/dL (ref 0.44–1.00)
GFR calc Af Amer: >60 mL/min (ref >60)
GFR calc non Af Amer: >60 mL/min (ref >60)
Glucose, Bld: 103 mg/dL — ABNORMAL HIGH (ref 70–99)
POTASSIUM: 6.2 mmol/L — AB (ref 3.5–5.1)
Sodium: 139 mmol/L (ref 135–145)

## 2018-06-09 LAB — I-STAT CG4 LACTIC ACID, ED
Lactic Acid, Venous: 1.6 mmol/L (ref 0.5–1.9)
Lactic Acid, Venous: 1.01 mmol/L (ref 0.5–1.9)

## 2018-06-09 LAB — MAGNESIUM: Magnesium: 2.4 mg/dL (ref 1.7–2.4)

## 2018-06-09 MED ORDER — FLUTICASONE PROPIONATE HFA 110 MCG/ACT IN AERO: 2.0000 | INHALATION_SPRAY | Freq: Two times a day (BID) | RESPIRATORY_TRACT | 1 refills | Status: DC

## 2018-06-09 MED ORDER — GUAIFENESIN-DM 100-10 MG/5ML PO SYRP: 5.0000 mL | ORAL_SOLUTION | ORAL | Status: DC | PRN

## 2018-06-09 MED ORDER — SENNOSIDES-DOCUSATE SODIUM 8.6-50 MG PO TABS: 1.0000 | ORAL_TABLET | Freq: Every evening | ORAL | Status: DC | PRN

## 2018-06-09 MED ORDER — SODIUM CHLORIDE 0.9 % IV SOLN
INTRAVENOUS | Status: DC
  Administered 2018-06-10: 01:00:00 via INTRAVENOUS

## 2018-06-09 MED ORDER — AZITHROMYCIN 250 MG PO TABS
250.0000 mg | ORAL_TABLET | Freq: Once | ORAL | Status: AC
  Administered 2018-06-10: 250 mg via ORAL
  Filled 2018-06-09: qty 1

## 2018-06-09 MED ORDER — ONDANSETRON HCL 4 MG/2ML IJ SOLN: 4.0000 mg | Freq: Four times a day (QID) | INTRAMUSCULAR | Status: DC | PRN

## 2018-06-09 MED ORDER — ONDANSETRON HCL 4 MG PO TABS: 4.0000 mg | ORAL_TABLET | Freq: Four times a day (QID) | ORAL | Status: DC | PRN

## 2018-06-09 MED ORDER — PREDNISONE 10 MG PO TABS
10.0000 mg | ORAL_TABLET | Freq: Every day | ORAL | Status: DC
  Filled 2018-06-09: qty 1

## 2018-06-09 MED ORDER — HYDROCODONE-ACETAMINOPHEN 5-325 MG PO TABS: 1.0000 | ORAL_TABLET | ORAL | Status: DC | PRN

## 2018-06-09 MED ORDER — BENZONATATE 100 MG PO CAPS: 200.0000 mg | ORAL_CAPSULE | Freq: Two times a day (BID) | ORAL | Status: DC | PRN

## 2018-06-09 MED ORDER — ACETAMINOPHEN 325 MG PO TABS: 650.0000 mg | ORAL_TABLET | Freq: Four times a day (QID) | ORAL | Status: DC | PRN

## 2018-06-09 MED ORDER — ACETAMINOPHEN 650 MG RE SUPP: 650.0000 mg | Freq: Four times a day (QID) | RECTAL | Status: DC | PRN

## 2018-06-09 MED ORDER — ENOXAPARIN SODIUM 40 MG/0.4ML ~~LOC~~ SOLN
40.0000 mg | Freq: Every day | SUBCUTANEOUS | Status: DC
  Filled 2018-06-09: qty 0.4

## 2018-06-09 MED ORDER — PREDNISONE 5 MG PO TABS: 5.0000 mg | ORAL_TABLET | Freq: Every day | ORAL | Status: DC

## 2018-06-09 MED ORDER — SODIUM CHLORIDE 0.9 % IV BOLUS
500.0000 mL | Freq: Once | INTRAVENOUS | Status: AC
  Administered 2018-06-09: 500 mL via INTRAVENOUS

## 2018-06-09 MED ORDER — AEROCHAMBER PLUS FLO-VU MISC: 0 refills | Status: DC

## 2018-06-09 MED ORDER — SODIUM CHLORIDE 0.9% FLUSH: 3.0000 mL | Freq: Two times a day (BID) | INTRAVENOUS | Status: DC

## 2018-06-09 MED ORDER — RIVAROXABAN 20 MG PO TABS: 20.0000 mg | ORAL_TABLET | Freq: Every day | ORAL | Status: DC

## 2018-06-09 NOTE — H&P (Signed)
History and Physical    Valerie Chen:[id number] DOB: 1929-01-13 DOA: 06/09/2018  PCP: Briscoe Deutscher, DO   Patient coming from: Home   Chief Complaint: Cough, congestion, syncope   HPI: Valerie Chen is a 84 y.o. female with medical history significant for Alzheimer dementia, gastrointestinal stromal tumor, recurrent UTIs, and history of syncopal episodes attributed to vasovagal reactions, now presenting to the emergency department for evaluation of syncope and new onset atrial fibrillation.  Patient is accompanied by her daughter and a caregiver who augments the history.  She reportedly developed cough, rhinorrhea, and congestion several days ago, was started on prednisone and azithromycin by her PCP, continues to cough and was seen back in the PCP office today where she was noted to be in new atrial fibrillation and had a syncopal episode while seated in the office.  1 of the patient's caregivers has recently been out sick with upper respiratory symptoms.  Patient has complained of some chest discomfort over the past couple days.  She follows with oncology for GIST but with her advanced dementia, family has declined treatment with [place].  ED Course: Upon arrival to the ED, patient is found to be afebrile, saturating adequately on room air, slightly tachypneic, and with vitals otherwise normal.  EKG features atrial fibrillation with PVC.  Chest x-ray is negative for acute cardiopulmonary disease.  Chemistry panel reveals a potassium of 6.2 initially, but this was repeated without any intervention and potassium was 3.9.  CBC features a mild polycythemia and TSH is normal.  Lactic acid is reassuringly normal and troponin is 0.01.  Urinalysis is unremarkable.  Patient remains in rate controlled atrial fibrillation in the ED with stable blood pressure and no acute respiratory distress.  She will be observed for further evaluation of a syncopal episode in the setting of acute URI and new atrial  fibrillation.  Review of Systems:  All other systems reviewed and apart from HPI, are negative.  Past Medical History:  Diagnosis Date  . Alzheimer disease ([place])   . Arthritis   . CAD (coronary artery disease)    Probable CAD, nuclear February, 2012, possible anteroseptal and inferoseptal ischemia but the study was technically difficult  . Cancer ([place])   . Chest pain    Hospital February, 2012, nuclear, possible anteroseptal and inferoseptal ischemia, technically difficult, medical therapy  . Dementia ([place])    Significant  . Diastolic dysfunction    Echo, February, 2012  . Diastolic dysfunction   . DNR (do not resuscitate)   . Dyslipidemia   . Edema    hospitalizations September, 2013, some fluid overload  . Ejection fraction    EF 55%, echo, February, 2012  //   EF 55-60%, echo, February 25, 2012 // Echo 12/17: EF 60-65, no RWMA, Gr 1 DD, normal RVSF  . Essential hypertension   . GI bleed    February, 2012 incomplete colonoscopy, hemorrhoids, needs complete colonoscopy  . GIST (gastrointestinal stromal tumor) of small bowel, malignant ([place]) 01/12/2018  . Hx: UTI (urinary tract infection)   . Hyperlipidemia   . Leg pain, bilateral   . Major depression    2 overdoses in 1975 & 1977  . Migraines    25 year  . Orthostatic hypotension   . Suicide attempt ([place]) 1985   2 attempts with Valium  . TIA (transient ischemic attack)       ???question of a TIA in February, 2012, carotid Doppler, August 11, 2010 no significant abnormalities    Past Surgical  History:  Procedure Laterality Date  . ABDOMINAL HYSTERECTOMY    . DIAGNOSTIC MAMMOGRAM  12/29/2000   Mammogram, suspicious lesions -BX pending  . TONSILLECTOMY       reports that she has never smoked. She has never used smokeless tobacco. She reports current drug use. She reports that she does not drink alcohol.  Allergies  Allergen Reactions  . Levaquin [Levofloxacin] Other (See Comments)    Altered mental status   .  Rocephin [Ceftriaxone] Other (See Comments)    Uncontrollable chills/shaking, dizziness, and disorientation after receiving this (??)  . Statins Other (See Comments)    Severe myalgias    Family History  Problem Relation Age of Onset  . Heart attack Father        several heart attacks  . Cancer Father        bone  . Arthritis Father   . Suicidality Mother   . Depression Mother   . Heart attack Brother   . Heart disease Brother   . Pneumonia Brother   . Suicidality Son   . Hypertension Daughter   . Depression Other        Most family members     Prior to Admission medications   Medication Sig Start Date End Date Taking? Authorizing Provider  acetaminophen (TYLENOL) 500 MG tablet Take 1,000 mg by mouth every 6 (six) hours as needed for moderate pain.   Yes [provider]  Ascorbic Acid (VITAMIN C) 1000 MG tablet Take 1,000 mg by mouth daily.   Yes [provider]  azithromycin (ZITHROMAX) 250 MG tablet 2 po on day one, then one po each day after. Patient taking differently: Take 250-500 mg by mouth See admin instructions. 5 day course started 06/06/18: take 2 tablets (1000 mg) by mouth 1st day, then take 1 tablet (500 mg) daily on days 2-5 06/06/18  Yes Briscoe Deutscher, DO  benzonatate (TESSALON) 200 MG capsule Take 1 capsule (200 mg total) by mouth 2 (two) times daily as needed for cough. 06/06/18  Yes Briscoe Deutscher, DO  Cholecalciferol (VITAMIN D-3) 1000 units CAPS Take 1,000 Units by mouth daily.    Yes [provider]  cyanocobalamin 500 MCG tablet Take 500 mcg by mouth daily.    Yes [provider]  Multiple Vitamin (MULTIVITAMIN WITH MINERALS) TABS tablet Take 1 tablet by mouth daily.   Yes [provider]  OVER THE COUNTER MEDICATION Take 1 capsule by mouth 2 (two) times daily with a meal. "Fem Dophilus" for urinary tract health   Yes [provider]  OVER THE COUNTER MEDICATION Take 5 mLs by mouth daily. D-Mannose powder  - for urinary tract health - mix in 6 oz water and drink   Yes [provider]  predniSONE (DELTASONE) 5 MG tablet 6-5-4-3-2-1-off Patient taking differently: Take 5-30 mg by mouth See admin instructions. Tapered course started 06/06/18: take 6 tablets (30 mg) by mouth 1st day, then take 5 tablets (25 mg) 2nd day, then take 4 tablets (20 mg) 3rd day, then take 3 tablets (15 mg) 4th day, then take 2 tablets (10 mg) 5th day, then take 1 tablet (5 mg) 6th day, then stop 06/06/18  Yes Briscoe Deutscher, DO  fluticasone (FLOVENT HFA) 110 MCG/ACT inhaler Inhale 2 puffs into the lungs 2 (two) times daily. 06/09/18   Inda Coke, PA  Spacer/Aero-Holding Chambers (AEROCHAMBER PLUS WITH MASK) inhaler Use spacer when using inhaler 06/09/18   Inda Coke, [place]    Physical Exam: Vitals:  06/09/18 1830 06/09/18 2000 06/09/18 2012 06/09/18 2045  BP: 137/68 118/73  137/73  Pulse: 64 (!) 127 65 60  Resp: (!) 22 18 19 19   Temp:      TempSrc:      SpO2: 99% 96% 96% 98%  Weight:      Height:        Constitutional: NAD, calm  Eyes: PERTLA, lids and conjunctivae normal ENMT: Mucous membranes are moist. Posterior pharynx clear of any exudate or lesions.   Neck: normal, supple, no masses, no thyromegaly Respiratory: clear to auscultation bilaterally, no wheezing, no crackles. Normal respiratory effort.    Cardiovascular: Rate ~60 and irregular. No extremity edema.   Abdomen: No distension, no tenderness, soft. Bowel sounds normal.  Musculoskeletal: no clubbing / cyanosis. No joint deformity upper and lower extremities.    Skin: no significant rashes, lesions, ulcers. Warm, dry, well-perfused. Neurologic: No facial asymmetry. Sensation intact. Moving all extremities.  Psychiatric: Alert and oriented to person only. Calm, cooperative.    Labs on Admission: I have personally reviewed following labs and imaging studies  CBC: Recent Labs  Lab 06/06/18 1438 06/09/18 1737 06/09/18 1944  WBC  3.8* 5.2  --   NEUTROABS 1.9 3.2  --   HGB 15.4* 15.9* 13.3  HCT 45.2 48.3* 39.0  MCV 92.8 92.9  --   PLT 162.0 150  --    Basic Metabolic Panel: Recent Labs  Lab 06/06/18 1438 06/09/18 1737 06/09/18 1944  NA 140 139 140  K 4.8 6.2* 3.9  CL 105 105 106  CO2 27 22  --   GLUCOSE 93 103* 96  BUN 10 13 11   CREATININE 1.01 0.86 0.70  CALCIUM 8.9 9.2  --   MG  --  2.4  --    GFR: Estimated Creatinine Clearance: 46.4 mL/min (by C-G formula based on SCr of 0.7 mg/dL). Liver Function Tests: Recent Labs  Lab 06/06/18 1438  AST 20  ALT 23  ALKPHOS 77  BILITOT 0.4  PROT 6.0  ALBUMIN 3.9   No results for input(s): LIPASE, AMYLASE in the last 168 hours. No results for input(s): AMMONIA in the last 168 hours. Coagulation Profile: Recent Labs  Lab 06/09/18 1737  INR 0.92   Cardiac Enzymes: No results for input(s): CKTOTAL, CKMB, CKMBINDEX, TROPONINI in the last 168 hours. BNP (last 3 results) No results for input(s): PROBNP in the last 8760 hours. HbA1C: No results for input(s): HGBA1C in the last 72 hours. CBG: No results for input(s): GLUCAP in the last 168 hours. Lipid Profile: No results for input(s): CHOL, HDL, LDLCALC, TRIG, CHOLHDL, LDLDIRECT in the last 72 hours. Thyroid Function Tests: Recent Labs    06/09/18 1737  TSH 1.446   Anemia Panel: No results for input(s): VITAMINB12, FOLATE, FERRITIN, TIBC, IRON, RETICCTPCT in the last 72 hours. Urine analysis:    Component Value Date/Time   COLORURINE YELLOW 06/09/2018 [place] 06/09/2018 1850   LABSPEC 1.005 06/09/2018 1850   PHURINE 7.0 06/09/2018 1850   GLUCOSEU NEGATIVE 06/09/2018 1850   GLUCOSEU NEGATIVE 12/09/2016 1104   HGBUR NEGATIVE 06/09/2018 1850   BILIRUBINUR NEGATIVE 06/09/2018 1850   BILIRUBINUR Negative 06/06/2018 1450   KETONESUR NEGATIVE 06/09/2018 1850   PROTEINUR NEGATIVE 06/09/2018 1850   UROBILINOGEN 0.2 06/06/2018 1450   UROBILINOGEN 0.2 12/09/2016 1104   NITRITE  NEGATIVE 06/09/2018 1850   LEUKOCYTESUR NEGATIVE 06/09/2018 1850   Sepsis Labs: @LABRCNTIP (procalcitonin:4,lacticidven:4) ) Recent Results (from the past 240 hour(s))  Urine Culture  Status: None   Collection Time: 06/06/18  4:24 PM  Result Value Ref Range Status   MICRO NUMBER: [id number]  Final   SPECIMEN QUALITY: Adequate  Final   Sample Source NOT GIVEN  Final   STATUS: FINAL  Final   Result:   Final    Multiple organisms present, each less than 10,000 CFU/mL. These organisms, commonly found on external and internal genitalia, are considered to be colonizers. No further testing performed.     Radiological Exams on Admission: Dg Chest 2 View  Result Date: 06/09/2018 CLINICAL DATA:  Shortness of breath, worsening bronchitis, dementia, hypertension, coronary artery disease EXAM: CHEST - 2 VIEW COMPARISON:  06/06/2018 FINDINGS: Minimal enlargement of cardiac silhouette. Atherosclerotic calcification aorta. Mediastinal contours and pulmonary vascularity normal. Lungs appear mildly hyperinflated but clear. No acute infiltrate, pleural effusion, or pneumothorax. Bones demineralized. IMPRESSION: No acute abnormalities. Electronically Signed   By: Lavonia Dana M.D.   On: 06/09/2018 16:24    EKG: Independently reviewed. Atrial fibrillation, PVC.   Assessment/Plan   1. Syncope  - Presents from PCP office after a syncopal episode  - She is in new atrial fibrillation  - She has hx of syncopal episodes that were felt to be vasovagal  - Continue cardiac monitoring, check orthostatic vitals, echocardiogram, IVF hydration    2. New-onset atrial fibrillation  - Noted to be in atrial fibrillation at PCP office, denies hx of this  - TSH, troponin, K, and mag normal in ED  - Likely precipitated by her respiratory illness, though with her reports of chest discomfort an ischemic etiology is conceivable  - CHADS-VASc is 89 (age x2, TIA x2, gender)  - Family does not want her to start  anticoagulation  - Treat respiratory illness, continue cardiac monitoring, check another troponin, echocardiogram   3. Acute URI  - Reports several days of rhinorrhea and cough; one of her caregivers recently had similar illness  - She was started on prednisone and azithromycin by PCP, one day remaining  - Check influenza PCR, continue supportive care    4. GIST  - Followed by oncology, family declined treatment with [facility]   5. Dementia  - Family reports increased confusion and agitation at night, previously on Seroquel for this  - Her caregiver plans to stay the night for redirection, reassurance     DVT prophylaxis: Lovenox  Code Status: DNR  Family Communication: Daughter and caregiver updated at bedside Consults called: None Admission status: Observation     Vianne Bulls, MD [facility] Pager [phone]  If 7PM-7AM, please contact night-coverage [email or web address] Password [email or web address]  06/09/2018, 9:07 PM

## 2018-06-09 NOTE — ED Triage Notes (Signed)
Per ems pt was at dr and she was having A-fib at dr office and syncopal episode . Pt was there due to chest cold. Pt is at baseline hx of dementia. Alert to self, normal

## 2018-06-09 NOTE — Patient Instructions (Addendum)
Start 20 mg xarelto. This is a blood thinner. It is important to avoid falls while on this medication.  STOP prednisone.  Finish out antibiotic.  Use steroid inhaler, I have sent in an inhaler for you to use.   We are going to try to call and get you an appointment with cardiology within 1 week.   Atrial Fibrillation  Atrial fibrillation is a type of heartbeat that is irregular or fast (rapid). If you have this condition, your heart beats without any order. This makes it hard for your heart to pump blood in a normal way. Having this condition gives you more risk for stroke, heart failure, and other heart problems. Atrial fibrillation may start all of a sudden and then stop on its own, or it may become a long-lasting problem. What are the causes? This condition may be caused by heart conditions, such as:  High blood pressure.  Heart failure.  Heart valve disease.  Heart surgery. Other causes include:  Pneumonia.  Obstructive sleep apnea.  Lung cancer.  Thyroid disease.  Drinking too much alcohol. Sometimes the cause is not known. What increases the risk? You are more likely to develop this condition if:  You smoke.  You are older.  You have diabetes.  You are overweight.  You have a family history of this condition.  You exercise often and hard. What are the signs or symptoms? Common symptoms of this condition include:  A feeling like your heart is beating very fast.  Chest pain.  Feeling short of breath.  Feeling light-headed or weak.  Getting tired easily. Follow these instructions at home: Medicines  Take over-the-counter and prescription medicines only as told by your doctor.  If your doctor gives you a blood-thinning medicine, take it exactly as told. Taking too much of it can cause bleeding. Taking too little of it does not protect you against clots. Clots can cause a stroke. Lifestyle      Do not use any tobacco products. These include  cigarettes, chewing tobacco, and e-cigarettes. If you need help quitting, ask your doctor.  Do not drink alcohol.  Do not drink beverages that have caffeine. These include coffee, soda, and tea.  Follow diet instructions as told by your doctor.  Exercise regularly as told by your doctor. General instructions  If you have a condition that causes breathing to stop for a short period of time (apnea), treat it as told by your doctor.  Keep a healthy weight. Do not use diet pills unless your doctor says they are safe for you. Diet pills may make heart problems worse.  Keep all follow-up visits as told by your doctor. This is important. Contact a doctor if:  You notice a change in the speed, rhythm, or strength of your heartbeat.  You are taking a blood-thinning medicine and you see more bruising.  You get tired more easily when you move or exercise.  You have a sudden change in weight. Get help right away if:   You have pain in your chest or your belly (abdomen).  You have trouble breathing.  You have blood in your vomit, poop, or pee (urine).  You have any signs of a stroke. "BE FAST" is an easy way to remember the main warning signs: ? B - Balance. Signs are dizziness, sudden trouble walking, or loss of balance. ? E - Eyes. Signs are trouble seeing or a change in how you see. ? F - Face. Signs are sudden weakness or  loss of feeling in the face, or the face or eyelid drooping on one side. ? A - Arms. Signs are weakness or loss of feeling in an arm. This happens suddenly and usually on one side of the body. ? S - Speech. Signs are sudden trouble speaking, slurred speech, or trouble understanding what people say. ? T - Time. Time to call emergency services. Write down what time symptoms started.  You have other signs of a stroke, such as: ? A sudden, very bad headache with no known cause. ? Feeling sick to your stomach (nausea). ? Throwing up (vomiting). ? Jerky movements you  cannot control (seizure). These symptoms may be an emergency. Do not wait to see if the symptoms will go away. Get medical help right away. Call your local emergency services (911 in the [place] Do not drive yourself to the hospital. Summary  Atrial fibrillation is a type of heartbeat that is irregular or fast (rapid).  You are at higher risk of this condition if you smoke, are older, have diabetes, or are overweight.  Follow your doctor's instructions about medicines, diet, exercise, and follow-up visits.  Get help right away if you think that you have signs of a stroke. This information is not intended to replace advice given to you by your health care provider. Make sure you discuss any questions you have with your health care provider. Document Released: 03/16/2008 Document Revised: 07/29/2017 Document Reviewed: 07/29/2017 Elsevier Interactive Patient Education  2019 [employer].

## 2018-06-09 NOTE — ED Provider Notes (Signed)
[facility] EMERGENCY DEPARTMENT Provider Note   CSN: [id number] Arrival date & time: 06/09/18  1720     History   Chief Complaint Chief Complaint  Patient presents with  . Atrial Fibrillation    HPI Valerie Chen is a 84 y.o. female.  Level 5 caveat secondary to dementia.  History primarily from her daughter.  84 year old female here after syncopal event at the PCPs office.  She has been sick for over a week with a cough and recently was put on prednisone and Zithromax.  The cough has been getting worse and they brought her to the primary care doctor's office.  She was noted to be in a new A. fib.  They were planning on discharging her on Xarelto when the patient experienced a syncopal event.  Patient has no recollection of this.  There was no reported injury.  There are prior history of syncopal events in the past and a benign attributed to vasovagal.   The history is provided by the patient and a relative.  Atrial Fibrillation  This is a new problem. Episode onset: unknown. Associated symptoms include chest pain and shortness of breath. Pertinent negatives include no abdominal pain and no headaches. Nothing aggravates the symptoms. Nothing relieves the symptoms. She has tried nothing for the symptoms. The treatment provided no relief.    Past Medical History:  Diagnosis Date  . Alzheimer disease ([place])   . Arthritis   . CAD (coronary artery disease)    Probable CAD, nuclear February, 2012, possible anteroseptal and inferoseptal ischemia but the study was technically difficult  . Cancer ([place])   . Chest pain    Hospital February, 2012, nuclear, possible anteroseptal and inferoseptal ischemia, technically difficult, medical therapy  . Dementia ([place])    Significant  . Diastolic dysfunction    Echo, February, 2012  . Diastolic dysfunction   . DNR (do not resuscitate)   . Dyslipidemia   . Edema    hospitalizations September, 2013, some fluid overload  .  Ejection fraction    EF 55%, echo, February, 2012  //   EF 55-60%, echo, February 25, 2012 // Echo 12/17: EF 60-65, no RWMA, Gr 1 DD, normal RVSF  . Essential hypertension   . GI bleed    February, 2012 incomplete colonoscopy, hemorrhoids, needs complete colonoscopy  . 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 Hx: UTI (urinary tract infection)   . Hyperlipidemia   . Leg pain, bilateral   . Major depression    2 overdoses in 1975 & 1977  . Migraines    25 year  . Orthostatic hypotension   . Suicide attempt ([place]) 1985   2 attempts with Valium  . TIA (transient ischemic attack)       ???question of a TIA in February, 2012, carotid Doppler, August 11, 2010 no significant abnormalities    Patient Active Problem List   Diagnosis Date Noted  . 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 Breast mass, right 11/29/2017  . Skin lesion of back 11/29/2017  . Difficulty walking 02/23/2017  . Dementia, previously on Seroquel, able to wean with family and caregiver support 01/21/2017  . Abdominal mass, monitored by [facility] 01/21/2017  . NSVT (nonsustained ventricular tachycardia) ([place]) 05/11/2016  . Recurrent UTI, on daily Trimethroprim 02/01/2015  . Essential hypertension, on no medications 05/30/2013  . Coronary artery disease   . Diastolic dysfunction   . TIA (transient ischemic attack),  questionable, 07/2010, with negative carotid dopplers   . History of GI bleed, with hemorrhoids, last colonoscopy 2012 at time of bleed   . Dyslipidemia, no statin   . Arthralgia of multiple joints 05/22/2009  . Vitamin D deficiency 01/27/2009    Past Surgical History:  Procedure Laterality Date  . ABDOMINAL HYSTERECTOMY    . DIAGNOSTIC MAMMOGRAM  12/29/2000   Mammogram, suspicious lesions -BX pending  . TONSILLECTOMY       OB History   No obstetric history on file.      Home Medications    Prior to Admission  medications   Medication Sig Start Date End Date Taking? Authorizing Provider  acetaminophen (TYLENOL) 500 MG tablet Take 1,000 mg by mouth every 6 (six) hours as needed for moderate pain.    [provider]  azithromycin (ZITHROMAX) 250 MG tablet 2 po on day one, then one po each day after. 06/06/18   Briscoe Deutscher, DO  benzonatate (TESSALON) 200 MG capsule Take 1 capsule (200 mg total) by mouth 2 (two) times daily as needed for cough. 06/06/18   Briscoe Deutscher, DO  Cholecalciferol (VITAMIN D-3) 1000 units CAPS Take 1,000 Units by mouth daily.     [provider]  cyanocobalamin 500 MCG tablet Take 500 mcg by mouth daily.     [provider]  estradiol (ESTRACE) 0.1 MG/GM vaginal cream USE 0.5 GRAM PER VAGINA AT BEDTIME NIGHTLY FOR 2 WEEKS AND THEN 2-3 X WEEKLY. 04/04/18   [provider]  fluticasone (FLOVENT HFA) 110 MCG/ACT inhaler Inhale 2 puffs into the lungs 2 (two) times daily. 06/09/18   Inda Coke, PA  predniSONE (DELTASONE) 5 MG tablet 6-5-4-3-2-1-off 06/06/18   Briscoe Deutscher, DO  Spacer/Aero-Holding Chambers (AEROCHAMBER PLUS WITH MASK) inhaler Use spacer when using inhaler 06/09/18   Inda Coke, PA  UNABLE TO FIND Maryann Alar dophilus one capsule daily at 9:00AM    [provider]  UNABLE TO FIND Maxi one multivitamin one daily at 9:00AM    [provider]  UNABLE TO FIND Take by mouth daily. Take 1 teaspoon daily, D-mannose , Bladder and UTI support-- add 1 scoop to 6 oz of water    [provider]    Family History Family History  Problem Relation Age of Onset  . Heart attack Father        several heart attacks  . Cancer Father        bone  . Arthritis Father   . Suicidality Mother   . Depression Mother   . Heart attack Brother   . Heart disease Brother   . Pneumonia Brother   . Suicidality Son   . Hypertension Daughter   . Depression Other        Most family members    Social History Social  History   Tobacco Use  . Smoking status: Never Smoker  . Smokeless tobacco: Never Used  Substance Use Topics  . Alcohol use: No  . Drug use: Yes    Comment: hx of valium in 1980's - had 2 overdoses per daughter     Allergies   Levaquin [levofloxacin]; Rocephin [ceftriaxone]; and Statins   Review of Systems Review of Systems  Constitutional: Negative for fever.  HENT: Negative for sore throat.   Eyes: Negative for visual disturbance.  Respiratory: Positive for cough and shortness of breath.   Cardiovascular: Positive for chest pain.  Gastrointestinal: Negative for abdominal pain and vomiting.  Genitourinary: Negative for dysuria.  Musculoskeletal: Negative  for neck pain.  Skin: Negative for rash.  Neurological: Negative for headaches.     Physical Exam Updated Vital Signs BP 123/72 (BP Location: Right Arm)   Pulse 74   Temp (!) 97.3 F (36.3 C) (Oral)   Resp 16   Ht 5\' 2"  (1.575 m)   Wt 73 kg   SpO2 98%   BMI 29.45 kg/m   Physical Exam Vitals signs and nursing note reviewed.  Constitutional:      General: She is not in acute distress.    Appearance: She is well-developed.  HENT:     Head: Normocephalic and atraumatic.  Eyes:     Conjunctiva/sclera: Conjunctivae normal.  Neck:     Musculoskeletal: Neck supple.  Cardiovascular:     Rate and Rhythm: Normal rate. Rhythm irregular.     Heart sounds: Normal heart sounds. No murmur.  Pulmonary:     Effort: Pulmonary effort is normal. No respiratory distress.     Breath sounds: Normal breath sounds.  Abdominal:     Palpations: Abdomen is soft.     Tenderness: There is no abdominal tenderness.  Musculoskeletal:        General: No tenderness.     Right lower leg: Edema present.     Left lower leg: Edema present.     Comments: Bilateral lower extremity edema 1+ symmetric.  No cords.  Skin:    General: Skin is warm and dry.     Capillary Refill: Capillary refill takes less than 2 seconds.  Neurological:      General: No focal deficit present.     Mental Status: She is alert. Mental status is at baseline. She is disoriented.     Comments: Oriented to person only.   CHA2DS2/VAS Stroke Risk Points  Current as of 9 minutes ago     7 >= 2 Points: High Risk  1 - 1.99 Points: Medium Risk  0 Points: Low Risk    The patient's score has not changed in the past year.:  No Change     Details    This score determines the patient's risk of having a stroke if the  patient has atrial fibrillation.       Points Metrics  0 Has Congestive Heart Failure:  No    Current as of 9 minutes ago  1 Has Vascular Disease:  Yes    Current as of 9 minutes ago  1 Has Hypertension:  Yes    Current as of 9 minutes ago  2 Age:  65    Current as of 9 minutes ago  0 Has Diabetes:  No    Current as of 9 minutes ago  2 Had Stroke:  No  Had TIA:  Yes  Had thromboembolism:  No    Current as of 9 minutes ago  1 Female:  Yes    Current as of 9 minutes ago            ED Treatments / Results  Labs (all labs ordered are listed, but only abnormal results are displayed) Labs Reviewed  BASIC METABOLIC PANEL - Abnormal; Notable for the following components:      Result Value   Potassium 6.2 (*)    Glucose, Bld 103 (*)    All other components within normal limits  CBC WITH DIFFERENTIAL/PLATELET - Abnormal; Notable for the following components:   RBC 5.20 (*)    Hemoglobin 15.9 (*)    HCT 48.3 (*)    All other  components within normal limits  I-STAT CHEM 8, ED - Abnormal; Notable for the following components:   Calcium, Ion 1.13 (*)    All other components within normal limits  TSH  MAGNESIUM  URINALYSIS, ROUTINE W REFLEX MICROSCOPIC  PROTIME-INR  BASIC METABOLIC PANEL  CBC WITH DIFFERENTIAL/PLATELET  INFLUENZA PANEL BY PCR (TYPE A & B)  TROPONIN I  TROPONIN I  I-STAT TROPONIN, ED  I-STAT CG4 LACTIC ACID, ED  I-STAT CG4 LACTIC ACID, ED    EKG EKG Interpretation  Date/Time:  Friday June 09 2018  17:30:00 EST Ventricular Rate:  87 PR Interval:    QRS Duration: 83 QT Interval:  374 QTC Calculation: 450 R Axis:   93 Text Interpretation:  Atrial fibrillation Ventricular premature complex Right axis deviation Low voltage, precordial leads new afib with pvc compared with prior 12/18 Confirmed by Aletta Edouard [phone]) on 06/09/2018 5:35:26 PM   Radiology Dg Chest 2 View  Result Date: 06/09/2018 CLINICAL DATA:  Shortness of breath, worsening bronchitis, dementia, hypertension, coronary artery disease EXAM: CHEST - 2 VIEW COMPARISON:  06/06/2018 FINDINGS: Minimal enlargement of cardiac silhouette. Atherosclerotic calcification aorta. Mediastinal contours and pulmonary vascularity normal. Lungs appear mildly hyperinflated but clear. No acute infiltrate, pleural effusion, or pneumothorax. Bones demineralized. IMPRESSION: No acute abnormalities. Electronically Signed   By: Lavonia Dana M.D.   On: 06/09/2018 16:24    Procedures Procedures (including critical care time)  Medications Ordered in ED Medications - No data to display   Initial Impression / Assessment and Plan / ED Course  I have reviewed the triage vital signs and the nursing notes.  Pertinent labs & imaging results that were available during my care of the patient were reviewed by me and considered in my medical decision making (see chart for details).  Clinical Course as of Jun 09 2332  Fri Jun 09, 2018  1742 Patient was duly found to be in A. fib today and then had a syncopal event in the PCPs office in the setting of a week's worth of cough and upper respiratory symptoms.  Her heart rates in the 70s and so it is unclear that the A. fib is the cause of her symptoms.   [MB]  7989 Patient had a two-view chest today that was read as negative for any infiltrates or effusions.   [MB]  2017 Discussed with Dr Myna Hidalgo from [facility] hospitalist to evaluate the patient in the ED for admission.   [MB]    Clinical Course User  Index [MB] Hayden Rasmussen, MD     Final Clinical Impressions(s) / ED Diagnoses   Final diagnoses:  Atrial fibrillation, unspecified type ([place])  Syncope, unspecified syncope type  Upper respiratory tract infection, unspecified type    ED Discharge Orders    None       Hayden Rasmussen, MD 06/09/18 2333

## 2018-06-09 NOTE — Progress Notes (Signed)
Valerie Chen is a 84 y.o. female here for follow-up.  I acted as a Education administrator for [employer], PA-C Anselmo Pickler, LPN  History of Present Illness:   Chief Complaint  Patient presents with  . Cough    HPI   Patient here for follow-up. She has baseline dementia and 100% of history provided by nurse. She is alert to self only.  She was seen by PCP on 06/06/18 for cough. At that time she had productive cough x 6 days and intermittent wheezing. She received levalbuterol treatment in office at that time. Had normal CXR and no elevated in WBC with lab draw. She was started on azithromycin and tessalon perles. She was also given a prescription of prednisone and was told to only start it if her caregiver felt like she really needed it.  Patient has two days left of Azithromcyin. Has been taking as scheduled. Appetite is fair. Has been taking Benzonatate but nursing notes that it doesn't seem to be helping. Has started oral prednisone.  Over the past two days, cough seems to be getting worse. Patient is having severe coughing fits at times with concerns for ongoing wheezing as well. Also, caregiver reports that patient has seemed a little off since around lunch today. After having a BM she has seemed a little less like herself -- unable to provide [place] with more information.  Denies: CP, SOB, fever, trauma, LOC  Past Medical History:  Diagnosis Date  . Alzheimer disease ([place])   . Arthritis   . CAD (coronary artery disease)    Probable CAD, nuclear February, 2012, possible anteroseptal and inferoseptal ischemia but the study was technically difficult  . Cancer ([place])   . Chest pain    Hospital February, 2012, nuclear, possible anteroseptal and inferoseptal ischemia, technically difficult, medical therapy  . Dementia ([place])    Significant  . Diastolic dysfunction    Echo, February, 2012  . Diastolic dysfunction   . DNR (do not resuscitate)   . Dyslipidemia   . Edema    hospitalizations  September, 2013, some fluid overload  . Ejection fraction    EF 55%, echo, February, 2012  //   EF 55-60%, echo, February 25, 2012 // Echo 12/17: EF 60-65, no RWMA, Gr 1 DD, normal RVSF  . Essential hypertension   . GI bleed    February, 2012 incomplete colonoscopy, hemorrhoids, needs complete colonoscopy  . 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 Hx: UTI (urinary tract infection)   . Hyperlipidemia   . Leg pain, bilateral   . Major depression    2 overdoses in 1975 & 1977  . Migraines    25 year  . Orthostatic hypotension   . Suicide attempt ([place]) 1985   2 attempts with Valium  . TIA (transient ischemic attack)       ???question of a TIA in February, 2012, carotid Doppler, August 11, 2010 no significant abnormalities     Social History   Socioeconomic History  . Marital status: Widowed    Spouse name: Not on file  . Number of children: 3  . Years of education: HS  . Highest education level: Not on file  Occupational History  . Occupation: RETIRED  Social Needs  . Financial resource strain: Not on file  . Food insecurity:    Worry: Not on file    Inability: Not on file  . Transportation needs:    Medical: Not on file    Non-medical:  Not on file  Tobacco Use  . Smoking status: Never Smoker  . Smokeless tobacco: Never Used  Substance and Sexual Activity  . Alcohol use: No  . Drug use: Yes    Comment: hx of valium in 1980's - had 2 overdoses per daughter  . Sexual activity: Never  Lifestyle  . Physical activity:    Days per week: Not on file    Minutes per session: Not on file  . Stress: Not on file  Relationships  . Social connections:    Talks on phone: Not on file    Gets together: Not on file    Attends religious service: Not on file    Active member of club or organization: Not on file    Attends meetings of clubs or organizations: Not on file    Relationship status: Not on file  . Intimate partner violence:     Fear of current or ex partner: Not on file    Emotionally abused: Not on file    Physically abused: Not on file    Forced sexual activity: Not on file  Other Topics Concern  . Not on file  Social History Narrative   Has 24/7 caregiver.      2 husbands   3 children      Diet: Heart Health   Caffeine- yes, tea   Married- widow   House- 1 stories, 2 persons   Pets- no, puppy died 1 1/2 years ago   Current/Past profession- Retired Engineer, manufacturing systems   Exercise- Yes, walking   Living Will-Yes   DNR- Yes   POA/HPOA- Yes   Right-handed.    Past Surgical History:  Procedure Laterality Date  . ABDOMINAL HYSTERECTOMY    . DIAGNOSTIC MAMMOGRAM  12/29/2000   Mammogram, suspicious lesions -BX pending  . TONSILLECTOMY      Family History  Problem Relation Age of Onset  . Heart attack Father        several heart attacks  . Cancer Father        bone  . Arthritis Father   . Suicidality Mother   . Depression Mother   . Heart attack Brother   . Heart disease Brother   . Pneumonia Brother   . Suicidality Son   . Hypertension Daughter   . Depression Other        Most family members    Allergies  Allergen Reactions  . Levaquin [Levofloxacin] Other (See Comments)    Altered mental status   . Rocephin [Ceftriaxone] Other (See Comments)    Uncontrollable chills/shaking, dizziness, and disorientation after receiving this (??)  . Statins Other (See Comments)    Severe myalgias    Current Medications:  No current facility-administered medications for this visit.  No current outpatient medications on file.  Facility-Administered Medications Ordered in Other Visits:  .  acetaminophen (TYLENOL) tablet 650 mg, 650 mg, Oral, Q6H PRN **OR** acetaminophen (TYLENOL) suppository 650 mg, 650 mg, Rectal, Q6H PRN, Opyd, Ilene Qua, MD .  azithromycin (ZITHROMAX) tablet 250 mg, 250 mg, Oral, Once, Opyd, Timothy S, MD .  enoxaparin (LOVENOX) injection 40 mg, 40 mg, Subcutaneous, Daily, Opyd, Timothy  S, MD .  guaiFENesin-dextromethorphan (ROBITUSSIN DM) 100-10 MG/5ML syrup 5 mL, 5 mL, Oral, Q4H PRN, Opyd, Timothy S, MD .  HYDROcodone-acetaminophen (NORCO/VICODIN) 5-325 MG per tablet 1-2 tablet, 1-2 tablet, Oral, Q4H PRN, Opyd, Timothy S, MD .  ipratropium-albuterol (DUONEB) 0.5-2.5 (3) MG/3ML nebulizer solution 3 mL, 3 mL, Nebulization, Q6H, Black, Karen M,  NP, 3 mL at 06/10/18 0838 .  ondansetron (ZOFRAN) tablet 4 mg, 4 mg, Oral, Q6H PRN **OR** ondansetron (ZOFRAN) injection 4 mg, 4 mg, Intravenous, Q6H PRN, Opyd, Ilene Qua, MD .  predniSONE (DELTASONE) tablet 10 mg, 10 mg, Oral, Q breakfast, Opyd, Ilene Qua, MD .  Derrill Memo ON 06/11/2018] predniSONE (DELTASONE) tablet 5 mg, 5 mg, Oral, Q breakfast, Opyd, Timothy S, MD .  senna-docusate (Senokot-S) tablet 1 tablet, 1 tablet, Oral, QHS PRN, Opyd, Timothy S, MD .  sodium chloride flush (NS) 0.9 % injection 3 mL, 3 mL, Intravenous, Q12H, Opyd, Ilene Qua, MD   Review of Systems:   Review of Systems  Negative unless otherwise specified per HPI.  Vitals:   Vitals:   06/09/18 1519  BP: 122/70  Pulse: 80  Temp: 98.2 F (36.8 C)  TempSrc: Oral  SpO2: 96%  Weight: 161 lb (73 kg)  Height: 5\' 2"  (1.575 m)     Body mass index is 29.45 kg/m.  Physical Exam:   Physical Exam Vitals signs and nursing note reviewed.  Constitutional:      General: She is not in acute distress.    Appearance: She is well-developed. She is not ill-appearing or toxic-appearing.  HENT:     Head: Normocephalic and atraumatic.     Right Ear: Ear canal and external ear normal.     Left Ear: Ear canal and external ear normal.     Ears:     Comments: Bilateral ear with cerumen impaction, unable to visualize TM    Nose: Nose normal.     Right Sinus: No maxillary sinus tenderness or frontal sinus tenderness.     Left Sinus: No maxillary sinus tenderness or frontal sinus tenderness.     Mouth/Throat:     Pharynx: Uvula midline. Posterior oropharyngeal erythema  present.  Eyes:     General: Lids are normal.     Conjunctiva/sclera: Conjunctivae normal.  Neck:     Musculoskeletal: Full passive range of motion without pain.     Trachea: Trachea normal.  Cardiovascular:     Rate and Rhythm: Normal rate. Rhythm regularly irregular.     Heart sounds: Normal heart sounds, S1 normal and S2 normal.     Comments: 1+ bilateral LE edema, wearing compression hose Pulmonary:     Effort: Pulmonary effort is normal.     Breath sounds: Normal breath sounds. No decreased breath sounds, wheezing, rhonchi or rales.  Lymphadenopathy:     Cervical: No cervical adenopathy.  Skin:    General: Skin is warm and dry.  Neurological:     Mental Status: She is alert. Mental status is at baseline. She is disoriented.     Cranial Nerves: Cranial nerves are intact.     Sensory: Sensation is intact.     Motor: Motor function is intact.     Coordination: Coordination normal.  Psychiatric:        Speech: Speech normal.        Behavior: Behavior normal. Behavior is cooperative.    CXR: normal  EKG: atrial fibrillation   Assessment and Plan:   Avina was seen today for cough.  Diagnoses and all orders for this visit:  SOB (shortness of breath) Ongoing URI. CXR without evidence of PNA. Will stop oral prednisone and trial steroid inhaler, scheduled. See below. -     EKG 12-Lead -     DG Chest 2 View; Future  Atrial fibrillation, unspecified type ([place]) EKG with new onset atrial fibrillation. Daughter  called by PCP while in office. We called cardiology on behalf of patient to secure an appointment, which we did for Jan 6. We were going to start Xarelto 20 mg daily while patient was waiting to see cardiology for further work-up and determination of plan. CHADSVASC2 = 7. While we were getting patient ready to leave office, she had a brief episode of transient loss of awareness, did not lose consciousness or fall. Patient's caregiver alerted staff of this. It was decided at  that time to call EMS and have patient sent to ER for further work-up. Patient was given 4 x 81 mg chewable ASA and was placed on oxygen while awaiting EMS arrival. Vitals remained stable while in office. BP 130/80 and HR in 70s.  Will defer further treatment and evaluation of afibrillation to ER/Hospitalist. Daughter was notified by caregiver of plan to go to ER for further evaluation. -     Protime-INR; Future -     Comprehensive metabolic panel; Future -     CBC with Differential/Platelet; Future  Other orders -     fluticasone (FLOVENT HFA) 110 MCG/ACT inhaler; Inhale 2 puffs into the lungs 2 (two) times daily. -     Spacer/Aero-Holding Chambers (AEROCHAMBER PLUS WITH MASK) inhaler; Use spacer when using inhaler  . Reviewed expectations re: course of current medical issues. . Discussed self-management of symptoms. . Outlined signs and symptoms indicating need for more acute intervention. . Patient verbalized understanding and all questions were answered. . See orders for this visit as documented in the electronic medical record. . Patient received an After-Visit Summary.  CMA or LPN served as scribe during this visit. History, Physical, and Plan performed by medical provider. The above documentation has been reviewed and is accurate and complete.  I spent 40 minutes with this patient, greater than 50% was face-to-face time counseling regarding the above diagnoses.  Patient has a HIGH level of medical complexity due to number of diagnoses/treatment options and amount/complexity of data reviewed.  Inda Coke, PA-C

## 2018-06-10 ENCOUNTER — Encounter: Payer: Self-pay | Admitting: Physician Assistant

## 2018-06-10 ENCOUNTER — Other Ambulatory Visit: Payer: Self-pay

## 2018-06-10 ENCOUNTER — Observation Stay (HOSPITAL_BASED_OUTPATIENT_CLINIC_OR_DEPARTMENT_OTHER): Payer: Medicare Other

## 2018-06-10 DIAGNOSIS — I361 Nonrheumatic tricuspid (valve) insufficiency: Secondary | ICD-10-CM

## 2018-06-10 DIAGNOSIS — R55 Syncope and collapse: Secondary | ICD-10-CM | POA: Diagnosis not present

## 2018-06-10 DIAGNOSIS — I351 Nonrheumatic aortic (valve) insufficiency: Secondary | ICD-10-CM | POA: Diagnosis not present

## 2018-06-10 DIAGNOSIS — I370 Nonrheumatic pulmonary valve stenosis: Secondary | ICD-10-CM | POA: Diagnosis not present

## 2018-06-10 DIAGNOSIS — G309 Alzheimer's disease, unspecified: Secondary | ICD-10-CM | POA: Diagnosis not present

## 2018-06-10 DIAGNOSIS — F0281 Dementia in other diseases classified elsewhere with behavioral disturbance: Secondary | ICD-10-CM | POA: Diagnosis not present

## 2018-06-10 DIAGNOSIS — I4891 Unspecified atrial fibrillation: Secondary | ICD-10-CM | POA: Diagnosis not present

## 2018-06-10 LAB — CBC WITH DIFFERENTIAL/PLATELET
Band Neutrophils: 0 %
Basophils Absolute: 0 10*3/uL (ref 0.0–0.1)
Basophils Relative: 1 %
Blasts: 0 %
Eosinophils Absolute: 0 10*3/uL (ref 0.0–0.5)
Eosinophils Relative: 0 %
HCT: 40.8 % (ref 36.0–46.0)
Hemoglobin: 13.8 g/dL (ref 12.0–15.0)
Lymphocytes Relative: 51 %
Lymphs Abs: 2.2 10*3/uL (ref 0.7–4.0)
MCH: 31.2 pg (ref 26.0–34.0)
MCHC: 33.8 g/dL (ref 30.0–36.0)
MCV: 92.1 fL (ref 80.0–100.0)
Metamyelocytes Relative: 0 %
Monocytes Absolute: 0.5 10*3/uL (ref 0.1–1.0)
Monocytes Relative: 11 %
Myelocytes: 0 %
NEUTROS PCT: 37 %
NRBC: 0 % (ref 0.0–0.2)
Neutro Abs: 1.6 10*3/uL — ABNORMAL LOW (ref 1.7–7.7)
Other: 0 %
Platelets: 157 10*3/uL (ref 150–400)
Promyelocytes Relative: 0 %
RBC: 4.43 MIL/uL (ref 3.87–5.11)
RDW: 12.6 % (ref 11.5–15.5)
WBC: 4.3 10*3/uL (ref 4.0–10.5)
nRBC: 0 /100 WBC

## 2018-06-10 LAB — TROPONIN I
Troponin I: <0.03 ng/mL (ref <0.03)
Troponin I: <0.03 ng/mL (ref <0.03)

## 2018-06-10 LAB — BASIC METABOLIC PANEL
Anion gap: 11 (ref 5–15)
BUN: 11 mg/dL (ref 8–23)
CO2: 22 mmol/L (ref 22–32)
Calcium: 8.4 mg/dL — ABNORMAL LOW (ref 8.9–10.3)
Chloride: 110 mmol/L (ref 98–111)
Creatinine, Ser: 0.92 mg/dL (ref 0.44–1.00)
GFR calc non Af Amer: 56 mL/min — ABNORMAL LOW (ref >60)
Glucose, Bld: 92 mg/dL (ref 70–99)
Potassium: 3.4 mmol/L — ABNORMAL LOW (ref 3.5–5.1)
SODIUM: 143 mmol/L (ref 135–145)

## 2018-06-10 LAB — ECHOCARDIOGRAM COMPLETE
Height: 62 in
Weight: 2512 oz

## 2018-06-10 LAB — INFLUENZA PANEL BY PCR (TYPE A & B)
Influenza A By PCR: NEGATIVE
Influenza B By PCR: NEGATIVE

## 2018-06-10 MED ORDER — FAMOTIDINE 20 MG PO TABS
20.0000 mg | ORAL_TABLET | Freq: Every day | ORAL | Status: DC
  Administered 2018-06-10: 20 mg via ORAL
  Filled 2018-06-10: qty 1

## 2018-06-10 MED ORDER — SENNOSIDES-DOCUSATE SODIUM 8.6-50 MG PO TABS: 1.0000 | ORAL_TABLET | Freq: Every evening | ORAL | Status: DC | PRN

## 2018-06-10 MED ORDER — LEVALBUTEROL HCL 0.63 MG/3ML IN NEBU: 0.6300 mg | INHALATION_SOLUTION | Freq: Four times a day (QID) | RESPIRATORY_TRACT | Status: DC

## 2018-06-10 MED ORDER — FLUTICASONE PROPIONATE 50 MCG/ACT NA SUSP
2.0000 | Freq: Every day | NASAL | Status: DC
  Administered 2018-06-10: 2 via NASAL
  Filled 2018-06-10: qty 16

## 2018-06-10 MED ORDER — IPRATROPIUM-ALBUTEROL 0.5-2.5 (3) MG/3ML IN SOLN
3.0000 mL | Freq: Four times a day (QID) | RESPIRATORY_TRACT | Status: DC
  Administered 2018-06-10: 3 mL via RESPIRATORY_TRACT
  Filled 2018-06-10: qty 3

## 2018-06-10 MED ORDER — LEVALBUTEROL HCL 0.63 MG/3ML IN NEBU: 0.6300 mg | INHALATION_SOLUTION | Freq: Four times a day (QID) | RESPIRATORY_TRACT | 12 refills | Status: DC

## 2018-06-10 MED ORDER — FAMOTIDINE 20 MG PO TABS: 20.0000 mg | ORAL_TABLET | Freq: Every day | ORAL | Status: DC

## 2018-06-10 NOTE — Progress Notes (Signed)
Nebulizer Machine ordered as requestedAneta Chen [phone]

## 2018-06-10 NOTE — Progress Notes (Signed)
*  PRELIMINARY RESULTS* Echocardiogram 2D Echocardiogram has been performed.  Valerie Chen 06/10/2018, 10:23 AM

## 2018-06-10 NOTE — Progress Notes (Signed)
Discharge instructions given to patient's caregiver Tammy, Scot Dock all questions answered.

## 2018-06-10 NOTE — Progress Notes (Signed)
   06/10/18 0026  Vitals  Temp (!) 97.5 F (36.4 C)  Temp Source Oral  BP 140/63  MAP (mmHg) 86  BP Location Left Arm  BP Method Automatic  Patient Position (if appropriate) Sitting  Pulse Rate 61  Resp 20  Oxygen Therapy  SpO2 99 %  O2 Device Room Air  Patient arrived to room and put in low bed. Placed on telemetry.  Alert and oriented only to self.  Biomedical engineer, Dominique at the bedside.  Patient has no complaints at this time.

## 2018-06-10 NOTE — Care Management Obs Status (Signed)
[facility] NOTIFICATION   Patient Details  Name: Kiasha Bellin MRN: [id number] Date of Birth: 1928-10-15   Medicare Observation Status Notification Given:  Yes(document signed by family member with pt permission)    Royston Bake, RN 06/10/2018, 1:55 PM

## 2018-06-10 NOTE — Progress Notes (Signed)
Patient was able to ambulate in the about 241ft with no complains. She had a steady gait and was walking on her own.

## 2018-06-10 NOTE — Discharge Summary (Signed)
Physician Discharge Summary  Danielys Madry KGU:[id number] DOB: [birth year] DOA: 06/09/2018  PCP: Briscoe Deutscher, DO  Admit date: 06/09/2018 Discharge date: 06/10/2018  Time spent: 45 minutes  Recommendations for Outpatient Follow-up:  1. Follow up with PCP 1-2 weeks for evaluation of resolution of cough 2. Follow up with cardiology Jan 6 as scheduled   Discharge Diagnoses:  Principal Problem:   Syncope Active Problems:   Coronary artery disease   Recurrent UTI, on daily Trimethroprim   Dementia, previously on Seroquel, able to wean with family and caregiver support   GIST (gastrointestinal stromal tumor) of small bowel, malignant ([place])   New onset atrial fibrillation ([place])   URI with cough and congestion   Discharge Condition: stable  Diet recommendation: heart healthy  Filed Weights   06/09/18 1724 06/10/18 0026  Weight: 73 kg 71.2 kg    History of present illness:  Valerie Chen is a 84 y.o. female with medical history significant for Alzheimer dementia, gastrointestinal stromal tumor, recurrent UTIs, and history of syncopal episodes attributed to vasovagal reactions, presented to ED 12/20 for evaluation of syncope and new onset atrial fibrillation.  Patient was accompanied by her daughter and a caregiver who augments the history.  She reportedly developed cough, rhinorrhea, and congestion several days prior, was started on prednisone and azithromycin by her PCP, continued to cough and was seen back in the PCP office 12/20 where she was noted to be in new atrial fibrillation and had a syncopal episode while seated in the office.  Patient had complained of some chest discomfort couple of days prior.  She follows with oncology for GIST but with her advanced dementia, family has declined treatment with [facility]. Of note, office visit note yesterday describes episode as "brief episode of transient loss of awareness, did not lose consciousness or fall".  Hospital Course:   1.  Syncope  Presented from PCP office after a reported syncopal episode. Actually described as brief episode of transient loss of awareness, did not lose consciousness or fall". She has hx of syncopal episodes that were felt to be vasovagal. Recent vigorous coughing with URI. Echo with mild LVH and EF 65%. No events on tele. Remained in afib rate controlled. No metobolic derangements. Not orthostatic    2. New-onset atrial fibrillation  Noted to be in atrial fibrillation at PCP office, denies hx of this. TSH, troponin, K, and mag normal in ED. Likely precipitated by her respiratory illness. CHADS-VASc is 56 (age x2, TIA x2, gender). Family does not want her to start anticoagulation. Troponin negative, echo as above. Has appointment with afib clinic jan 6. Remained rate controlled in hospital  3. Acute URI Several days of rhinorrhea and cough; one of her caregivers recently had similar illness  She was started on prednisone and azithromycin by PCP, one day remaining. Oral prednisone stopped and changed to inhaler. Influenza panel negative. Has poor cough effort. Will discharge with nebulizer and inhaler as well as incentive spirometry. Directions given to caregiver at bedside    4. GIST  - Followed by oncology, family declined treatment with [facility]   5. Dementia  - Family reported increased confusion and agitation at night. Has nighttime sitter.  Procedures:  Echo as noted above  Consultations:  none  Discharge Exam: Vitals:   06/10/18 0506 06/10/18 0511  BP:    Pulse:    Resp:    Temp:    SpO2: 100% 98%    General: well nourished alert oriented to self only cooperative in no  acute distress Cardiovascular: irregularly irregular no mgr no LE edema Respiratory: no increased work of breathing. BS distant faint end expiratory rhonchi bilateral bases. No wheeze no crackles  Discharge Instructions   Discharge Instructions    Amb referral to AFIB Clinic   Complete by:  As directed     Call MD for:  difficulty breathing, headache or visual disturbances   Complete by:  As directed    Call MD for:  extreme fatigue   Complete by:  As directed    Call MD for:  persistant nausea and vomiting   Complete by:  As directed    Call MD for:  temperature >100.4   Complete by:  As directed    Diet - low sodium heart healthy   Complete by:  As directed    Discharge instructions   Complete by:  As directed    Take medications as instructed Drink fluids Encourage deep coughing Incentive spirometry every 2 hours while awake   Increase activity slowly   Complete by:  As directed      Allergies as of 06/10/2018      Reactions   Levaquin [levofloxacin] Other (See Comments)   Altered mental status    Rocephin [ceftriaxone] Other (See Comments)   Uncontrollable chills/shaking, dizziness, and disorientation after receiving this (??)   Statins Other (See Comments)   Severe myalgias      Medication List    STOP taking these medications   fluticasone 110 MCG/ACT inhaler Commonly known as:  FLOVENT HFA     TAKE these medications   acetaminophen 500 MG tablet Commonly known as:  TYLENOL Take 1,000 mg by mouth every 6 (six) hours as needed for moderate pain.   aerochamber plus with mask inhaler Use spacer when using inhaler   azithromycin 250 MG tablet Commonly known as:  ZITHROMAX 2 po on day one, then one po each day after. What changed:    how much to take  how to take this  when to take this  additional instructions   benzonatate 200 MG capsule Commonly known as:  TESSALON Take 1 capsule (200 mg total) by mouth 2 (two) times daily as needed for cough.   famotidine 20 MG tablet Commonly known as:  PEPCID Take 1 tablet (20 mg total) by mouth daily.   levalbuterol 0.63 MG/3ML nebulizer solution Commonly known as:  XOPENEX Take 3 mLs (0.63 mg total) by nebulization every 6 (six) hours.   multivitamin with minerals Tabs tablet Take 1 tablet by mouth  daily.   OVER THE COUNTER MEDICATION Take 1 capsule by mouth 2 (two) times daily with a meal. "Fem Dophilus" for urinary tract health   OVER THE COUNTER MEDICATION Take 5 mLs by mouth daily. D-Mannose powder - for urinary tract health - mix in 6 oz water and drink   predniSONE 5 MG tablet Commonly known as:  DELTASONE 6-5-4-3-2-1-off What changed:    how much to take  how to take this  when to take this  additional instructions   senna-docusate 8.6-50 MG tablet Commonly known as:  Senokot-S Take 1 tablet by mouth at bedtime as needed for mild constipation.   vitamin B-12 500 MCG tablet Commonly known as:  CYANOCOBALAMIN Take 500 mcg by mouth daily.   vitamin C 1000 MG tablet Take 1,000 mg by mouth daily.   Vitamin D-3 25 MCG (1000 UT) Caps Take 1,000 Units by mouth daily.  Durable Medical Equipment  (From admission, onward)         Start     Ordered   06/10/18 1217  For home use only DME Nebulizer machine  Once    Question:  Patient needs a nebulizer to treat with the following condition  Answer:  COPD (chronic obstructive pulmonary disease) ([place])   06/10/18 1216         Allergies  Allergen Reactions  . Levaquin [Levofloxacin] Other (See Comments)    Altered mental status   . Rocephin [Ceftriaxone] Other (See Comments)    Uncontrollable chills/shaking, dizziness, and disorientation after receiving this (??)  . Statins Other (See Comments)    Severe myalgias      The results of significant diagnostics from this hospitalization (including imaging, microbiology, ancillary and laboratory) are listed below for reference.    Significant Diagnostic Studies: Dg Chest 2 View  Result Date: 06/09/2018 CLINICAL DATA:  Shortness of breath, worsening bronchitis, dementia, hypertension, coronary artery disease EXAM: CHEST - 2 VIEW COMPARISON:  06/06/2018 FINDINGS: Minimal enlargement of cardiac silhouette. Atherosclerotic calcification aorta.  Mediastinal contours and pulmonary vascularity normal. Lungs appear mildly hyperinflated but clear. No acute infiltrate, pleural effusion, or pneumothorax. Bones demineralized. IMPRESSION: No acute abnormalities. Electronically Signed   By: Lavonia Dana M.D.   On: 06/09/2018 16:24   Dg Chest 2 View  Result Date: 06/07/2018 CLINICAL DATA:  Cough EXAM: CHEST - 2 VIEW COMPARISON:  None. FINDINGS: The heart is upper normal in size. Lungs are hyperaerated and clear. No pneumothorax or pleural effusion. IMPRESSION: No active cardiopulmonary disease. Electronically Signed   By: Marybelle Killings M.D.   On: 06/07/2018 08:16    Microbiology: Recent Results (from the past 240 hour(s))  Urine Culture     Status: None   Collection Time: 06/06/18  4:24 PM  Result Value Ref Range Status   MICRO NUMBER: [id number]  Final   SPECIMEN QUALITY: Adequate  Final   Sample Source NOT GIVEN  Final   STATUS: FINAL  Final   Result:   Final    Multiple organisms present, each less than 10,000 CFU/mL. These organisms, commonly found on external and internal genitalia, are considered to be colonizers. No further testing performed.     Labs: Basic Metabolic Panel: Recent Labs  Lab 06/06/18 1438 06/09/18 1737 06/09/18 1944 06/10/18 0244  NA 140 139 140 143  K 4.8 6.2* 3.9 3.4*  CL 105 105 106 110  CO2 27 22  --  22  GLUCOSE 93 103* 96 92  BUN 10 13 11 11   CREATININE 1.01 0.86 0.70 0.92  CALCIUM 8.9 9.2  --  8.4*  MG  --  2.4  --   --    Liver Function Tests: Recent Labs  Lab 06/06/18 1438  AST 20  ALT 23  ALKPHOS 77  BILITOT 0.4  PROT 6.0  ALBUMIN 3.9   No results for input(s): LIPASE, AMYLASE in the last 168 hours. No results for input(s): AMMONIA in the last 168 hours. CBC: Recent Labs  Lab 06/06/18 1438 06/09/18 1737 06/09/18 1944 06/10/18 0244  WBC 3.8* 5.2  --  4.3  NEUTROABS 1.9 3.2  --  1.6*  HGB 15.4* 15.9* 13.3 13.8  HCT 45.2 48.3* 39.0 40.8  MCV 92.8 92.9  --  92.1  PLT 162.0 150   --  157   Cardiac Enzymes: Recent Labs  Lab 06/10/18 0244 06/10/18 0728  TROPONINI <0.03 <0.03   BNP: BNP (last 3 results)  No results for input(s): BNP in the last 8760 hours.  ProBNP (last 3 results) No results for input(s): PROBNP in the last 8760 hours.  CBG: No results for input(s): GLUCAP in the last 168 hours.     SignedRadene Gunning NP  [facility] 06/10/2018, 12:53 PM

## 2018-06-11 ENCOUNTER — Other Ambulatory Visit: Payer: Self-pay | Admitting: Physician Assistant

## 2018-06-16 ENCOUNTER — Emergency Department (HOSPITAL_COMMUNITY)
Admission: EM | Admit: 2018-06-16 | Discharge: 2018-06-16 | Disposition: A | Discharge status: Home / Self Care | Payer: Medicare Other | Attending: Emergency Medicine | Admitting: Emergency Medicine

## 2018-06-16 ENCOUNTER — Emergency Department (HOSPITAL_COMMUNITY): Payer: Medicare Other

## 2018-06-16 ENCOUNTER — Other Ambulatory Visit: Payer: Self-pay

## 2018-06-16 DIAGNOSIS — R05 Cough: Secondary | ICD-10-CM

## 2018-06-16 DIAGNOSIS — F0390 Unspecified dementia without behavioral disturbance: Secondary | ICD-10-CM | POA: Diagnosis not present

## 2018-06-16 DIAGNOSIS — Z79899 Other long term (current) drug therapy: Secondary | ICD-10-CM | POA: Diagnosis not present

## 2018-06-16 DIAGNOSIS — R52 Pain, unspecified: Secondary | ICD-10-CM | POA: Diagnosis not present

## 2018-06-16 DIAGNOSIS — R0781 Pleurodynia: Secondary | ICD-10-CM | POA: Diagnosis not present

## 2018-06-16 DIAGNOSIS — R059 Cough, unspecified: Secondary | ICD-10-CM

## 2018-06-16 DIAGNOSIS — I4891 Unspecified atrial fibrillation: Secondary | ICD-10-CM | POA: Diagnosis not present

## 2018-06-16 DIAGNOSIS — G309 Alzheimer's disease, unspecified: Secondary | ICD-10-CM | POA: Diagnosis not present

## 2018-06-16 DIAGNOSIS — I2510 Atherosclerotic heart disease of native coronary artery without angina pectoris: Secondary | ICD-10-CM | POA: Diagnosis not present

## 2018-06-16 DIAGNOSIS — R410 Disorientation, unspecified: Secondary | ICD-10-CM | POA: Diagnosis not present

## 2018-06-16 DIAGNOSIS — R404 Transient alteration of awareness: Secondary | ICD-10-CM | POA: Diagnosis not present

## 2018-06-16 DIAGNOSIS — I10 Essential (primary) hypertension: Secondary | ICD-10-CM | POA: Diagnosis not present

## 2018-06-16 LAB — CBC WITH DIFFERENTIAL/PLATELET
Abs Immature Granulocytes: 0.02 10*3/uL (ref 0.00–0.07)
Basophils Absolute: 0 10*3/uL (ref 0.0–0.1)
Basophils Relative: 1 %
EOS PCT: 1 %
Eosinophils Absolute: 0 10*3/uL (ref 0.0–0.5)
HCT: 42.2 % (ref 36.0–46.0)
Hemoglobin: 14 g/dL (ref 12.0–15.0)
Immature Granulocytes: 0 %
Lymphocytes Relative: 32 %
Lymphs Abs: 1.6 10*3/uL (ref 0.7–4.0)
MCH: 30.9 pg (ref 26.0–34.0)
MCHC: 33.2 g/dL (ref 30.0–36.0)
MCV: 93.2 fL (ref 80.0–100.0)
Monocytes Absolute: 0.3 10*3/uL (ref 0.1–1.0)
Monocytes Relative: 7 %
Neutro Abs: 2.9 10*3/uL (ref 1.7–7.7)
Neutrophils Relative %: 59 %
Platelets: 210 10*3/uL (ref 150–400)
RBC: 4.53 MIL/uL (ref 3.87–5.11)
RDW: 12.8 % (ref 11.5–15.5)
WBC: 4.8 10*3/uL (ref 4.0–10.5)
nRBC: 0 % (ref 0.0–0.2)

## 2018-06-16 LAB — COMPREHENSIVE METABOLIC PANEL
ALBUMIN: 3.4 g/dL — AB (ref 3.5–5.0)
ALT: 19 U/L (ref 0–44)
AST: 17 U/L (ref 15–41)
Alkaline Phosphatase: 55 U/L (ref 38–126)
Anion gap: 7 (ref 5–15)
BUN: 10 mg/dL (ref 8–23)
CO2: 22 mmol/L (ref 22–32)
Calcium: 8.3 mg/dL — ABNORMAL LOW (ref 8.9–10.3)
Chloride: 111 mmol/L (ref 98–111)
Creatinine, Ser: 0.64 mg/dL (ref 0.44–1.00)
GFR calc Af Amer: >60 mL/min (ref >60)
GFR calc non Af Amer: >60 mL/min (ref >60)
Glucose, Bld: 77 mg/dL (ref 70–99)
Potassium: 3.1 mmol/L — ABNORMAL LOW (ref 3.5–5.1)
Sodium: 140 mmol/L (ref 135–145)
Total Bilirubin: 0.9 mg/dL (ref 0.3–1.2)
Total Protein: 5.8 g/dL — ABNORMAL LOW (ref 6.5–8.1)

## 2018-06-16 LAB — I-STAT TROPONIN, ED: Troponin i, poc: 0.01 ng/mL (ref 0.00–0.08)

## 2018-06-16 MED ORDER — POTASSIUM CHLORIDE CRYS ER 20 MEQ PO TBCR
40.0000 meq | EXTENDED_RELEASE_TABLET | Freq: Once | ORAL | Status: AC
  Administered 2018-06-16: 40 meq via ORAL
  Filled 2018-06-16: qty 2

## 2018-06-16 NOTE — ED Notes (Signed)
Bed: IH47 Expected date:  Expected time:  Means of arrival:  Comments: EMS-PNA

## 2018-06-16 NOTE — ED Provider Notes (Signed)
[facility] DEPT Provider Note   CSN: [id number] Arrival date & time: 06/16/18  0973     History   Chief Complaint Chief Complaint  Patient presents with  . Flank Pain    HPI Valerie Chen is a 84 y.o. female.  The history is provided by medical records. The history is limited by the condition of the patient. No language interpreter was used.  Flank Pain    Valerie Chen is a 84 y.o. female  with a PMH as listed below who presents to the Emergency Department with caregiver via EMS.  Per caregiver, patient was diagnosed with pneumonia on December 17.  She started on azithromycin and prednisone.  She subsequently went to the emergency department on 12/20 where she was admitted for new onset A. fib.  Caregiver states that they were told that the prednisone and/or azithromycin actually put her into A. Fib.  She has had a persistent cough, but daughter and caregiver both feel like this is improving.  Today, she was sitting when she suddenly grabbed and held underneath her right breast.  She was acting as if she was in pain.  This lasted several minutes and then she began acting as usual.  No recurrence.  Caregiver checked her vitals which were all normal. She became concerned as patient could not tell her if she was hurting.   Level V caveat applies 2/2 dementia.   Past Medical History:  Diagnosis Date  . Alzheimer disease ([place])   . Arthritis   . CAD (coronary artery disease)    Probable CAD, nuclear February, 2012, possible anteroseptal and inferoseptal ischemia but the study was technically difficult  . Cancer ([place])   . Chest pain    Hospital February, 2012, nuclear, possible anteroseptal and inferoseptal ischemia, technically difficult, medical therapy  . Dementia ([place])    Significant  . Diastolic dysfunction    Echo, February, 2012  . Diastolic dysfunction   . DNR (do not resuscitate)   . Dyslipidemia   . Edema    hospitalizations September,  2013, some fluid overload  . Ejection fraction    EF 55%, echo, February, 2012  //   EF 55-60%, echo, February 25, 2012 // Echo 12/17: EF 60-65, no RWMA, Gr 1 DD, normal RVSF  . Essential hypertension   . GI bleed    February, 2012 incomplete colonoscopy, hemorrhoids, needs complete colonoscopy  . GIST (gastrointestinal stromal tumor) of small bowel, malignant ([place]) 01/12/2018  . Hx: UTI (urinary tract infection)   . Hyperlipidemia   . Leg pain, bilateral   . Major depression    2 overdoses in 1975 & 1977  . Migraines    25 year  . Orthostatic hypotension   . Suicide attempt ([place]) 1985   2 attempts with Valium  . TIA (transient ischemic attack)       ???question of a TIA in February, 2012, carotid Doppler, August 11, 2010 no significant abnormalities    Patient Active Problem List   Diagnosis Date Noted  . Syncope 06/09/2018  . New onset atrial fibrillation ([place]) 06/09/2018  . URI with cough and congestion 06/09/2018  . GIST (gastrointestinal stromal tumor) of small bowel, malignant ([place]) 01/12/2018  . Breast mass, right 11/29/2017  . Skin lesion of back 11/29/2017  . Difficulty walking 02/23/2017  . Dementia, previously on Seroquel, able to wean with family and caregiver support 01/21/2017  . Abdominal mass, monitored by [facility] 01/21/2017  . NSVT (nonsustained ventricular tachycardia) ([place]) 05/11/2016  .  Recurrent UTI, on daily Trimethroprim 02/01/2015  . Essential hypertension, on no medications 05/30/2013  . Coronary artery disease   . Diastolic dysfunction   . TIA (transient ischemic attack), questionable, 07/2010, with negative carotid dopplers   . History of GI bleed, with hemorrhoids, last colonoscopy 2012 at time of bleed   . Dyslipidemia, no statin   . Arthralgia of multiple joints 05/22/2009  . Vitamin D deficiency 01/27/2009    Past Surgical History:  Procedure Laterality Date  . ABDOMINAL HYSTERECTOMY    . DIAGNOSTIC MAMMOGRAM  12/29/2000   Mammogram,  suspicious lesions -BX pending  . TONSILLECTOMY       OB History   No obstetric history on file.      Home Medications    Prior to Admission medications   Medication Sig Start Date End Date Taking? Authorizing Provider  acetaminophen (TYLENOL) 500 MG tablet Take 1,000 mg by mouth every 6 (six) hours as needed for moderate pain.    [provider]  Ascorbic Acid (VITAMIN C) 1000 MG tablet Take 1,000 mg by mouth daily.    [provider]  azithromycin (ZITHROMAX) 250 MG tablet 2 po on day one, then one po each day after. Patient taking differently: Take 250-500 mg by mouth See admin instructions. 5 day course started 06/06/18: take 2 tablets (1000 mg) by mouth 1st day, then take 1 tablet (500 mg) daily on days 2-5 06/06/18   Briscoe Deutscher, DO  benzonatate (TESSALON) 200 MG capsule Take 1 capsule (200 mg total) by mouth 2 (two) times daily as needed for cough. 06/06/18   Briscoe Deutscher, DO  Cholecalciferol (VITAMIN D-3) 1000 units CAPS Take 1,000 Units by mouth daily.     [provider]  cyanocobalamin 500 MCG tablet Take 500 mcg by mouth daily.     [provider]  famotidine (PEPCID) 20 MG tablet Take 1 tablet (20 mg total) by mouth daily. 06/10/18   Black, Lezlie Octave, NP  FLOVENT HFA 110 MCG/ACT inhaler TAKE 2 PUFFS BY MOUTH TWICE A DAY 06/12/18   Briscoe Deutscher, DO  levalbuterol (XOPENEX) 0.63 MG/3ML nebulizer solution Take 3 mLs (0.63 mg total) by nebulization every 6 (six) hours. 06/10/18   Black, Lezlie Octave, NP  Multiple Vitamin (MULTIVITAMIN WITH MINERALS) TABS tablet Take 1 tablet by mouth daily.    [provider]  OVER THE COUNTER MEDICATION Take 1 capsule by mouth 2 (two) times daily with a meal. "Fem Dophilus" for urinary tract health    [provider]  OVER THE COUNTER MEDICATION Take 5 mLs by mouth daily. D-Mannose powder - for urinary tract health - mix in 6 oz water and drink    [provider]  senna-docusate  (SENOKOT-S) 8.6-50 MG tablet Take 1 tablet by mouth at bedtime as needed for mild constipation. 06/10/18   Black, Lezlie Octave, NP  Spacer/Aero-Holding Chambers (AEROCHAMBER PLUS WITH MASK) inhaler Use spacer when using inhaler 06/09/18   Inda Coke, PA    Family History Family History  Problem Relation Age of Onset  . Heart attack Father        several heart attacks  . Cancer Father        bone  . Arthritis Father   . Suicidality Mother   . Depression Mother   . Heart attack Brother   . Heart disease Brother   . Pneumonia Brother   . Suicidality Son   . Hypertension Daughter   . Depression Other  Most family members    Social History Social History   Tobacco Use  . Smoking status: Never Smoker  . Smokeless tobacco: Never Used  Substance Use Topics  . Alcohol use: No  . Drug use: Yes    Comment: hx of valium in 1980's - had 2 overdoses per daughter     Allergies   Levaquin [levofloxacin]; Rocephin [ceftriaxone]; and Statins   Review of Systems Review of Systems  Unable to perform ROS: Dementia     Physical Exam Updated Vital Signs BP (!) 146/76   Pulse 80   Temp 97.6 F (36.4 C) (Oral)   Resp 18   SpO2 100%   Physical Exam Vitals signs and nursing note reviewed.  Constitutional:      General: She is not in acute distress.    Appearance: She is well-developed.  HENT:     Head: Normocephalic and atraumatic.  Cardiovascular:     Heart sounds: Normal heart sounds. No murmur.  Pulmonary:     Effort: Pulmonary effort is normal. No respiratory distress.     Breath sounds: Normal breath sounds.     Comments: No tenderness. Abdominal:     General: There is no distension.     Palpations: Abdomen is soft.     Tenderness: There is no abdominal tenderness.     Comments: No tenderness.  Skin:    General: Skin is warm and dry.  Neurological:     Mental Status: She is alert and oriented to person, place, and time.      ED Treatments / Results    Labs (all labs ordered are listed, but only abnormal results are displayed) Labs Reviewed  COMPREHENSIVE METABOLIC PANEL - Abnormal; Notable for the following components:      Result Value   Potassium 3.1 (*)    Calcium 8.3 (*)    Total Protein 5.8 (*)    Albumin 3.4 (*)    All other components within normal limits  CBC WITH DIFFERENTIAL/PLATELET  I-STAT TROPONIN, ED    EKG EKG Interpretation  Date/Time:  Friday June 16 2018 09:47:52 EST Ventricular Rate:  62 PR Interval:    QRS Duration: 86 QT Interval:  440 QTC Calculation: 447 R Axis:   90 Text Interpretation:  Atrial fibrillation Borderline right axis deviation Low voltage, precordial leads No STEMI.  Confirmed by Nanda Quinton [phone]) on 06/16/2018 11:12:19 AM   Radiology Dg Chest 2 View  Result Date: 06/16/2018 CLINICAL DATA:  Nonproductive cough. EXAM: CHEST - 2 VIEW COMPARISON:  04/02/2018 FINDINGS: The heart size and mediastinal contours are within normal limits. Both lungs are clear except for slight peribronchial thickening visible on the lateral view. The visualized skeletal structures are unremarkable. IMPRESSION: Slight bronchitic changes. Electronically Signed   By: Lorriane Shire M.D.   On: 06/16/2018 10:47    Procedures Procedures (including critical care time)  Medications Ordered in ED Medications  potassium chloride SA (K-DUR,KLOR-CON) CR tablet 40 mEq (40 mEq Oral Given 06/16/18 1148)     Initial Impression / Assessment and Plan / ED Course  I have reviewed the triage vital signs and the nursing notes.  Pertinent labs & imaging results that were available during my care of the patient were reviewed by me and considered in my medical decision making (see chart for details).    Meloni Hinz is a 84 y.o. female who presents to ED for evaluation after she grabbed her chest earlier today.  Patient has a history of dementia.  Because caregiver could not figure out why she grabbed her chest, she  brought her to the emergency department for work-up.  Recently diagnosed with atrial fibrillation.  A. fib on EKG, however rate controlled.  Labs reviewed and reassuring.  Potassium 3.1 which was replenished in the ED.  No pneumonia on chest x-ray.  Exam reassuring.  We will have her continue to keep her follow-up appointments with cardiologist and family medicine.  Reasons to return to ER discussed with caregiver, Langley Gauss.  All questions of caregivers and daughters answered.  Patient seen by and discussed with Dr. Laverta Baltimore who agrees with treatment plan.    Final Clinical Impressions(s) / ED Diagnoses   Final diagnoses:  Cough    ED Discharge Orders    None       Ward, Ozella Almond, PA-C 06/16/18 1158    Long, Wonda Olds, MD 06/16/18 [phone]

## 2018-06-16 NOTE — ED Notes (Signed)
Caregiver, Langley Gauss, at bedside.

## 2018-06-16 NOTE — ED Triage Notes (Signed)
Pt BIB EMS from home with c/o right rib pain.  Pt recently dx with pneumonia.  Pt AOx1 at baseline. Pt recently dx with afib.

## 2018-06-16 NOTE — Discharge Instructions (Signed)
It was my pleasure taking care of you today!   Fortunately, your work up today was very reassuring.   Keep your appointment with the cardiologist.   Return to ER for new or worsening symptoms, any additional concerns.

## 2018-06-26 ENCOUNTER — Ambulatory Visit (INDEPENDENT_AMBULATORY_CARE_PROVIDER_SITE_OTHER): Payer: Medicare Other | Admitting: Physician Assistant

## 2018-06-26 ENCOUNTER — Encounter: Payer: Self-pay | Admitting: Physician Assistant

## 2018-06-26 ENCOUNTER — Telehealth: Payer: Self-pay | Admitting: Physician Assistant

## 2018-06-26 VITALS — BP 118/60 | HR 86 | Ht 62.0 in | Wt 162.8 lb

## 2018-06-26 DIAGNOSIS — R55 Syncope and collapse: Secondary | ICD-10-CM

## 2018-06-26 DIAGNOSIS — I10 Essential (primary) hypertension: Secondary | ICD-10-CM

## 2018-06-26 DIAGNOSIS — C49A3 Gastrointestinal stromal tumor of small intestine: Secondary | ICD-10-CM | POA: Diagnosis not present

## 2018-06-26 DIAGNOSIS — I4819 Other persistent atrial fibrillation: Secondary | ICD-10-CM | POA: Diagnosis not present

## 2018-06-26 DIAGNOSIS — I2510 Atherosclerotic heart disease of native coronary artery without angina pectoris: Secondary | ICD-10-CM

## 2018-06-26 NOTE — Telephone Encounter (Signed)
Please call patient's daughter. I have reviewed with Dr. Marin Olp.  She is ok to start anticoagulation with Eliquis from his standpoint. Ms. Robotham daughter was going to review with her family and decide if she would start on anticoagulation.   If she is agreeable to start:  - Stop ASA  - Start Eliquis 5 mg Twice daily   - FU with CVRR clinic 6 weeks after starting.  Get CBC and BMET on that day. If she is not agreeable to start:  - Remain on ASA 81 mg QD. Richardson Dopp, PA-C    06/26/2018 10:10 PM

## 2018-06-26 NOTE — Telephone Encounter (Signed)
-----   Message from Volanda Napoleon, MD sent at 06/26/2018  2:49 PM EST ----- Nicki Reaper:  the mesenteric mass is NOT a contra-indication for Eliquis!!!  Do what you need to do!!!  Laurey Arrow ----- Message ----- From: Sharmon Revere Sent: 06/26/2018   1:40 PM EST To: Liliane Shi, PA-C, Volanda Napoleon, MD  Dr. Marin Olp I saw [name] today.  She has AFib and has a high risk of stroke.  She would benefit from anticoagulation to prevent a stroke and I would like to put her on Eliquis 5 mg Twice daily.  However, I was not certain if her mesenteric mass puts her at high for bleeding.  Do you think her mesenteric mass would be a contraindication to anticoagulation? Thank you, Richardson Dopp, PA-C

## 2018-06-26 NOTE — Progress Notes (Signed)
Cardiology Office Note:    Date:  06/26/2018   ID:  Valerie Chen, DOB 01/05/1929, MRN [id number]  PCP:  Valerie Deutscher, DO  Cardiologist:  Dr. Cleatis Chen >> Valerie Dopp, PA-C   Electrophysiologist:  N/a Neurologist: Dr. Krista Chen Psych: Dr. Geanie Chen [facility]) Oncologist:  Dr. Marin Chen   Referring MD: Valerie Deutscher, DO   Chief Complaint  Patient presents with  . Hospitalization Follow-up    AFib    History of Present Illness:    Valerie Chen is a 85 y.o. female with migraine HAs, presumed CAD and chronic chest pain. She was initially seen by Dr. Ron Chen in 08/2010 at [facility] with abdominal and chest pain. She ruled out for MI. Nuclear scan raised the possibility of anteroseptal and inferior septal ischemia. She had normal wall motion and normal LVF on echo. It was presumed that she has CAD. She did have a GI bleed complicating her hospitalization. She has dementia. Decision was made to treat medically.   She has had trouble with LE edema in the past. Echo 02/25/12: Mild LVH, EF 83-15%, grade 1 diastolic dysfunction, trivial AI, mild MR. Lasix was adjusted.  She was admitted with vasovagal syncope in 2017.  She was last seen in this office in 04/2016.  She has had recurrent vasovagal episodes since that time.  She was diagnosed in 2018 with a mesenteric mass felt to be a gastrointestinal stromal tumor.  She has been seen by oncology.  Her family has opted for conservative management.  Valerie Chen returns for post hospitalization follow up.  She was admitted 12/20-12/21 with syncope and new onset atrial fibrillation.  Prior to her admission, she had been placed on prednisone and azithromycin for upper respiratory tract infection symptoms.  She reportedly had a syncopal/near syncopal episode while in her primary care provider's office.  Echocardiogram demonstrated normal LV function.  CHADS2-VASc=6 (age x 2, ?hx of TIA x 2, vascular dz, female).  Her family did not want her to  take anticoagulation.  Since DC, she has started on an ASA 81 mg.   Today, she is here with her daughter and her CNA.  Since discharge, she is had one episode of musculoskeletal type chest pain.  She has not had any complaints of shortness of breath.  She has not had any lower extremity swelling.  She has not had further syncope.  Prior CV studies:   The following studies were reviewed today:  Echocardiogram 06/10/2018 Mild LVH, EF 60-65, mild AI, mild MR, moderate TR, PASP 38  Echocardiogram 06/08/2016 EF 60-65, normal wall motion, grade 1 diastolic dysfunction, normal RVSF  Carotid US 05/07/16 No ICA stenosis  Echo 12/30/15 EF 55-60, normal wall motion, grade 1 diastolic dysfunction, mild AI  Echo 11/02/14 EF 50-55  Past Medical History:  Diagnosis Date  . Alzheimer disease ([place])   . Arthritis   . CAD (coronary artery disease)    Probable CAD, nuclear February, 2012, possible anteroseptal and inferoseptal ischemia but the study was technically difficult  . Cancer ([place])   . Chest pain    Hospital February, 2012, nuclear, possible anteroseptal and inferoseptal ischemia, technically difficult, medical therapy  . Dementia ([place])    Significant  . Diastolic dysfunction    Echo, February, 2012  . Diastolic dysfunction   . DNR (do not resuscitate)   . Dyslipidemia   . Edema    hospitalizations September, 2013, some fluid overload  . Ejection fraction    EF 55%, echo, February, 2012  //  EF 55-60%, echo, February 25, 2012 // Echo 12/17: EF 60-65, no RWMA, Gr 1 DD, normal RVSF  . Essential hypertension   . GI bleed    February, 2012 incomplete colonoscopy, hemorrhoids, needs complete colonoscopy  . GIST (gastrointestinal stromal tumor) of small bowel, malignant ([place]) 01/12/2018  . Hx: UTI (urinary tract infection)   . Hyperlipidemia   . Leg pain, bilateral   . Major depression    2 overdoses in 1975 & 1977  . Migraines    25 year  . Orthostatic hypotension   . Suicide  attempt ([place]) 1985   2 attempts with Valium  . TIA (transient ischemic attack)       ???question of a TIA in February, 2012, carotid Doppler, August 11, 2010 no significant abnormalities   Surgical Hx: The patient  has a past surgical history that includes Abdominal hysterectomy; DIAGNOSTIC MAMMOGRAM (12/29/2000); and Tonsillectomy.   Current Medications: Current Meds  Medication Sig  . acetaminophen (TYLENOL) 500 MG tablet Take 1,000 mg by mouth every 6 (six) hours as needed for moderate pain.  . Ascorbic Acid (VITAMIN C) 1000 MG tablet Take 1,000 mg by mouth daily.  . Cholecalciferol (VITAMIN D-3) 1000 units CAPS Take 1,000 Units by mouth daily.   . cyanocobalamin 500 MCG tablet Take 500 mcg by mouth daily.   . Multiple Vitamin (MULTIVITAMIN WITH MINERALS) TABS tablet Take 1 tablet by mouth daily.  Marland Kitchen OVER THE COUNTER MEDICATION Take 1 capsule by mouth 2 (two) times daily with a meal. "Fem Dophilus" for urinary tract health  . OVER THE COUNTER MEDICATION Take 5 mLs by mouth daily. D-Mannose powder - for urinary tract health - mix in 6 oz water and drink  . [DISCONTINUED] benzonatate (TESSALON) 200 MG capsule Take 1 capsule (200 mg total) by mouth 2 (two) times daily as needed for cough.  . [DISCONTINUED] famotidine (PEPCID) 20 MG tablet Take 1 tablet (20 mg total) by mouth daily.  . [DISCONTINUED] FLOVENT HFA 110 MCG/ACT inhaler TAKE 2 PUFFS BY MOUTH TWICE A DAY  . [DISCONTINUED] levalbuterol (XOPENEX) 0.63 MG/3ML nebulizer solution Take 3 mLs (0.63 mg total) by nebulization every 6 (six) hours.  . [DISCONTINUED] senna-docusate (SENOKOT-S) 8.6-50 MG tablet Take 1 tablet by mouth at bedtime as needed for mild constipation.  . [DISCONTINUED] Spacer/Aero-Holding Chambers (AEROCHAMBER PLUS WITH MASK) inhaler Use spacer when using inhaler     Allergies:   Levaquin [levofloxacin]; Rocephin [ceftriaxone]; and Statins   Social History   Tobacco Use  . Smoking status: Never Smoker  .  Smokeless tobacco: Never Used  Substance Use Topics  . Alcohol use: No  . Drug use: Yes    Comment: hx of valium in 1980's - had 2 overdoses per daughter     Family Hx: The patient's family history includes Arthritis in her father; Cancer in her father; Depression in her mother and another family member; Heart attack in her brother and father; Heart disease in her brother; Hypertension in her daughter; Pneumonia in her brother; Suicidality in her mother and son.  ROS:   Please see the history of present illness.    Review of Systems  Cardiovascular: Positive for chest pain and irregular heartbeat.  Respiratory: Positive for cough.    All other systems reviewed and are negative.   EKGs/Labs/Other Test Reviewed:    EKG:  EKG is   ordered today.  The ekg ordered today demonstrates AFib, HR 86, rightward axis  Recent Labs: 06/09/2018: Magnesium 2.4; TSH 1.446 06/16/2018:  ALT 19; BUN 10; Creatinine, Ser 0.64; Hemoglobin 14.0; Platelets 210; Potassium 3.1; Sodium 140   Recent Lipid Panel Lab Results  Component Value Date/Time   CHOL 179 11/01/2014 10:38 PM   TRIG 96 11/01/2014 10:38 PM   HDL 72 11/01/2014 10:38 PM   CHOLHDL 2.5 11/01/2014 10:38 PM   LDLCALC 88 11/01/2014 10:38 PM    Physical Exam:    VS:  BP 118/60   Pulse 86   Ht 5\' 2"  (1.575 m)   Wt 162 lb 12.8 oz (73.8 kg)   SpO2 95%   BMI 29.78 kg/m     Wt Readings from Last 3 Encounters:  06/26/18 162 lb 12.8 oz (73.8 kg)  06/10/18 157 lb (71.2 kg)  06/09/18 161 lb (73 kg)     Physical Exam  Constitutional: She appears well-developed and well-nourished. No distress.  HENT:  Head: Normocephalic and atraumatic.  Eyes: No scleral icterus.  Neck: No JVD present. No thyromegaly present.  Cardiovascular: Normal rate. An irregularly irregular rhythm present.  No murmur heard. Pulmonary/Chest: She has no wheezes. She has no rales.  Abdominal: Soft.  Musculoskeletal:        General: No edema.  Lymphadenopathy:     She has no cervical adenopathy.  Neurological: She is alert. No cranial nerve deficit.  Skin: Skin is warm and dry.  Psychiatric: She has a normal mood and affect.    ASSESSMENT & PLAN:    Persistent atrial fibrillation  HR is controlled.  She does not appear to by symptomatic.  She is high risk for stroke.  I did explain to the patient's daughter and her CNA today that ASA does not provide benefit in terms of stroke reduction with atrial fibrillation, but it does increase risk of bleeding.  She would benefit from anticoagulation for stroke risk reduction.  I recommend Apixaban 5 mg Twice daily.  However, with her mesenteric mass, I will need to review further with Dr. Marin Chen to see if she has any increased risk of bleeding before we start anticoagulation.  Also, the patient's daughter would like to discuss it further with her siblings prior to initiating anticoagulation.  She has seen me in the past after Dr. Kae Heller retirement and there was no plan for ongoing follow up.  However, I will follow her along with Dr. Acie Fredrickson going forward.  -Discuss with oncology whether or not mesenteric mass prohibits anticoagulation   Coronary artery disease involving native coronary artery of native heart without angina pectoris  Presumed CAD in the past.  No clear anginal symptoms recently.  She can remain on ASA 81 mg QD for now.  Essential hypertension, on no medications The patient's blood pressure is controlled on her current regimen.  Continue current therapy.    Syncope, unspecified syncope type She has had several vasovagal episodes in the past.  Recent Echo with normal EF.  She would not likely tolerate an event monitor and I am not convinced she needs to be considered for a ILR.  No further work up.  GIST (gastrointestinal stromal tumor) of small bowel, malignant ([place]) Managed conservatively.  As noted, I will touch base with Dr. Marin Chen to see if her mesenteric mass would put her at  increased/prohibitive risk for anticoagulation.    Dispo:  Return in about 6 weeks (around 08/07/2018) for Routine Follow Up, w/ Valerie Dopp, PA-C.   Medication Adjustments/Labs and Tests Ordered: Current medicines are reviewed at length with the patient today.  Concerns regarding medicines are outlined  above.  Tests Ordered: Orders Placed This Encounter  Procedures  . EKG 12-Lead   Medication Changes: No orders of the defined types were placed in this encounter.   Signed, Valerie Dopp, PA-C  06/26/2018 1:36 PM    [facility] [place], [place], [place]  [address] Phone: [phone]; Fax: [phone]

## 2018-06-26 NOTE — Patient Instructions (Signed)
Medication Instructions:  Your physician recommends that you continue on your current medications as directed. Please refer to the Current Medication list given to you today.  If you need a refill on your cardiac medications before your next appointment, please call your pharmacy.   Lab work: NONE If you have labs (blood work) drawn today and your tests are completely normal, you will receive your results only by: Marland Kitchen MyChart Message (if you have MyChart) OR . A paper copy in the mail If you have any lab test that is abnormal or we need to change your treatment, we will call you to review the results.  Testing/Procedures: NONE  Follow-Up: At [facility], you and your health needs are our priority.  As part of our continuing mission to provide you with exceptional heart care, we have created designated Provider Care Teams.  These Care Teams include your primary Cardiologist (physician) and Advanced Practice Providers (APPs -  Physician Assistants and Nurse Practitioners) who all work together to provide you with the care you need, when you need it. . Your physician recommends that you schedule a follow-up appointment WITH SCOTT WEAVER, PA-C ON 07/25/18 @ 11:45   Any Other Special Instructions Will Be Listed Below (If Applicable).  Marland Kitchen

## 2018-06-27 NOTE — Telephone Encounter (Signed)
Spoke with pt daughter Fraser Din (DPR on file) and made her aware of Richardson Dopp, PA-C and Dr. Antonieta Pert recommendations. Pt's daughter verbalized understanding. Pt's daughter states that she spoke with her sister and pt's CNA and they have decided not  to start Eliquis. Pt's daughter states that pt will continue to take the ASA 81 mg daily and thanked [place] for the advice and recommendations.

## 2018-07-25 ENCOUNTER — Ambulatory Visit: Payer: 59 | Admitting: Physician Assistant

## 2018-08-14 ENCOUNTER — Telehealth: Payer: Self-pay

## 2018-08-14 NOTE — Telephone Encounter (Signed)
Daughter brought by fmla ppw to be filled out for her regarding mother. Placed in your box for review.

## 2018-08-15 NOTE — Telephone Encounter (Signed)
Completed and placed in completed folder.

## 2018-08-25 ENCOUNTER — Encounter: Payer: 59 | Admitting: Family Medicine

## 2018-09-13 ENCOUNTER — Encounter: Payer: 59 | Admitting: Family Medicine

## 2018-09-15 ENCOUNTER — Telehealth: Payer: Self-pay | Admitting: Family Medicine

## 2018-09-15 NOTE — Telephone Encounter (Signed)
Patients daughter called and wanted to inform Dr Juleen China that she is keeping her mothers caregivers during the lock down. She feels they are essential with her mothers health and memory the way it is. She stated that on their letters it will reference Dr Juleen China and list her phone number for questions. She said that this is just a [place]

## 2018-09-15 NOTE — Telephone Encounter (Signed)
Will forward to Dr. Juleen China as Juluis Rainier.

## 2018-09-21 ENCOUNTER — Telehealth: Payer: Self-pay | Admitting: Family Medicine

## 2018-09-21 NOTE — Telephone Encounter (Signed)
Patient's daughter, Rolm Bookbinder, called and left VM on COVID Nurse line asking if Dr. Juleen China will send a letter to her employer with the recommendation of her mother to be on self quarantine due to her age and health conditions and that she's the primary caregiver. She says to reference her mother's name and her name Rolm Bookbinder, attention: Rella Larve, Fax [phone]. If there are questions, call Pat at [phone].

## 2018-09-22 NOTE — Telephone Encounter (Signed)
Called Valerie Chen, scheduled virtual visit to discuss her mothers health concerns and need for a work note to stay out of work as her moms primary care provider.

## 2018-09-24 NOTE — Progress Notes (Signed)
Virtual Visit via Video   I connected with Valerie Chen on 09/25/18 at 10:00 AM EDT by a video enabled telemedicine application and verified that I am speaking with the correct person using two identifiers. Location patient: Home Location provider: [facility], Office Persons participating in the virtual visit: Valerie Chen, Briscoe Deutscher, DO Reviewed all precautions and expectations with prevention of Covid-19  I discussed the limitations of evaluation and management by telemedicine and the availability of in person appointments. The patient expressed understanding and agreed to proceed.  Subjective:   HPI: Patient in for follow up. She has been moving around good. Enjoying setting outside. Reviewed what to limit sun exposure when out side.  She did have a low heart rate of about 49. But when rechecked in 10 minutes it was back to normal 60.  She has been having normal bowel movements, sleeping and eating well. No problems or questions.   She did have small skin tear on right fore arm. They are keeping covered, and using over the counter antibiotic cream. directed to use coban and not Band-Aid to avoid further skin tear.    ROS: See pertinent positives and negatives per HPI.  Patient Active Problem List   Diagnosis Date Noted  . Syncope 06/09/2018  . New onset atrial fibrillation ([place]) 06/09/2018  . URI with cough and congestion 06/09/2018  . 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 Breast mass, right 11/29/2017  . Skin lesion of back 11/29/2017  . Difficulty walking 02/23/2017  . Dementia, previously on Seroquel, able to wean with family and caregiver support 01/21/2017  . Abdominal mass, monitored by [facility] 01/21/2017  . NSVT (nonsustained ventricular tachycardia) ([place]) 05/11/2016  . Recurrent UTI, on daily Trimethroprim 02/01/2015  . Essential hypertension, on no medications 05/30/2013  . Coronary artery disease   . Diastolic  dysfunction   . TIA (transient ischemic attack), questionable, 07/2010, with negative carotid dopplers   . History of GI bleed, with hemorrhoids, last colonoscopy 2012 at time of bleed   . Dyslipidemia, no statin   . Arthralgia of multiple joints 05/22/2009  . Vitamin D deficiency 01/27/2009    Social History   Tobacco Use  . Smoking status: Never Smoker  . Smokeless tobacco: Never Used  Substance Use Topics  . Alcohol use: No    Current Outpatient Medications:  .  acetaminophen (TYLENOL) 500 MG tablet, Take 1,000 mg by mouth every 6 (six) hours as needed for moderate pain., Disp: , Rfl:  .  Ascorbic Acid (VITAMIN C) 1000 MG tablet, Take 1,000 mg by mouth daily., Disp: , Rfl:  .  Cholecalciferol (VITAMIN D-3) 1000 units CAPS, Take 1,000 Units by mouth daily. , Disp: , Rfl:  .  cyanocobalamin 500 MCG tablet, Take 500 mcg by mouth daily. , Disp: , Rfl:  .  Multiple Vitamin (MULTIVITAMIN WITH MINERALS) TABS tablet, Take 1 tablet by mouth daily., Disp: , Rfl:  .  OVER THE COUNTER MEDICATION, Take 1 capsule by mouth 2 (two) times daily with a meal. "Fem Dophilus" for urinary tract health, Disp: , Rfl:  .  OVER THE COUNTER MEDICATION, Take 5 mLs by mouth daily. D-Mannose powder - for urinary tract health - mix in 6 oz water and drink, Disp: , Rfl:   Allergies  Allergen Reactions  . Levaquin [Levofloxacin] Other (See Comments)    Altered mental status   . Rocephin [Ceftriaxone] Other (See Comments)    Uncontrollable chills/shaking, dizziness, and disorientation  after receiving this (??)  . Statins Other (See Comments)    Severe myalgias    Objective:   VITALS: Per patient if applicable, see vitals. GENERAL: Alert, appears well and in no acute distress. HEENT: Atraumatic, conjunctiva clear, no obvious abnormalities on inspection of external nose and ears. NECK: Normal movements of the head and neck. CARDIOPULMONARY: No increased WOB. Speaking in clear sentences. I:E ratio WNL.  MS:  Moves all visible extremities without noticeable abnormality. PSYCH: Pleasant and cooperative, well-groomed. Speech normal rate and rhythm. Affect is appropriate. Insight and judgement are appropriate. Attention is focused, linear, and appropriate.  NEURO: CN grossly intact. Oriented as arrived to appointment on time with no prompting. Moves both UE equally.  SKIN: No obvious lesions, wounds, erythema, or cyanosis noted on face or hands.  Assessment and Plan:   [name] was seen today for follow-up.  Diagnoses and all orders for this visit:  Alzheimer's dementia with behavioral disturbance, unspecified timing of dementia onset ([place])  GIST (gastrointestinal stromal tumor) of small bowel, malignant ([place])  Essential hypertension, on no medications    . Reviewed expectations re: course of current medical issues. . Discussed self-management of symptoms. . Outlined signs and symptoms indicating need for more acute intervention. . Patient verbalized understanding and all questions were answered. Marland Kitchen Health Maintenance issues including appropriate healthy diet, exercise, and smoking avoidance were discussed with patient. . See orders for this visit as documented in the electronic medical record.  Briscoe Deutscher, DO 09/25/2018

## 2018-09-25 ENCOUNTER — Other Ambulatory Visit: Payer: Self-pay

## 2018-09-25 ENCOUNTER — Encounter: Payer: Self-pay | Admitting: Family Medicine

## 2018-09-25 ENCOUNTER — Ambulatory Visit (INDEPENDENT_AMBULATORY_CARE_PROVIDER_SITE_OTHER): Payer: Medicare Other | Admitting: Family Medicine

## 2018-09-25 VITALS — BP 112/64 | HR 66 | Temp 98.8°F | Ht 62.0 in | Wt 164.8 lb

## 2018-09-25 DIAGNOSIS — I2510 Atherosclerotic heart disease of native coronary artery without angina pectoris: Secondary | ICD-10-CM | POA: Diagnosis not present

## 2018-09-25 DIAGNOSIS — F0281 Dementia in other diseases classified elsewhere with behavioral disturbance: Secondary | ICD-10-CM

## 2018-09-25 DIAGNOSIS — G309 Alzheimer's disease, unspecified: Secondary | ICD-10-CM

## 2018-09-25 DIAGNOSIS — I10 Essential (primary) hypertension: Secondary | ICD-10-CM | POA: Diagnosis not present

## 2018-09-25 DIAGNOSIS — C49A3 Gastrointestinal stromal tumor of small intestine: Secondary | ICD-10-CM

## 2018-09-27 ENCOUNTER — Telehealth: Payer: Self-pay | Admitting: Family Medicine

## 2018-09-27 NOTE — Telephone Encounter (Signed)
Voicemail message left by pt's daughter requesting a note for work due to being self quarantined with her mother. Pt had a virtual visit on Monday, 09/25/18 with Dr. Juleen China. Pt's daughter states she thought a letter was going to be sent to her employer on Monday but her employer states that the letter has not been received.  Pt's daughter is requesting that letter be faxed to her employer on Thursday morning to  [employer] at [phone].  Pt's daughter states that she is unable to get into MyChart. Pt's daughter would also like a hard copy of the letter to be mailed to her home at [address], [place], [address]. Pt's daughter Rolm Bookbinder can be contacted at [phone]

## 2018-09-28 NOTE — Telephone Encounter (Signed)
Spoke to patient will re fax letter and mail hard copy.

## 2018-09-28 NOTE — Telephone Encounter (Signed)
See note

## 2018-10-18 ENCOUNTER — Ambulatory Visit: Payer: 59 | Admitting: Family Medicine

## 2018-10-19 ENCOUNTER — Telehealth: Payer: Self-pay

## 2018-10-19 NOTE — Telephone Encounter (Signed)
Received call from daughter. States that the evening care giver had changed medications with out letting family know. She had stopped giving all supplements that our office had agreed on patient taking and changed to one that she had ordered off the Internet. Family has done some reading online and found that some of the ingredients  can be harmful to patient.   Daughter addressed with caregiver and informed that she was making changes to patient medications with out recommendations of her doctor and if happened again that she would be dismissed.

## 2018-10-24 NOTE — Telephone Encounter (Signed)
Letter received from Rolm Bookbinder regarding Heart Supplement. She thinks this supplement has Aspirin in it. Wants to know if she should d/c Aspirin 81 mg.   Reviewed lab of [employer] [facility] Omega 3 Heart supplement, does not appear to have Aspirin. Confirmed with Dr. Juleen China. Valerie Chen will have pt continue on Aspirin 81 mg daily.

## 2018-11-30 ENCOUNTER — Telehealth: Payer: Self-pay

## 2018-11-30 NOTE — Telephone Encounter (Signed)
Copied from [place] [id number]. Topic: General - Other >> Nov 30, 2018 11:27 AM Ivar Drape wrote: Reason for CRM:   Patient would like to cancel the 12/11/2018 appt because she is scheduled for a physical on 12/26/2018

## 2018-12-11 ENCOUNTER — Ambulatory Visit: Payer: 59 | Admitting: Family Medicine

## 2018-12-26 ENCOUNTER — Encounter: Payer: Self-pay | Admitting: Family Medicine

## 2018-12-26 ENCOUNTER — Other Ambulatory Visit: Payer: Self-pay

## 2018-12-26 ENCOUNTER — Ambulatory Visit (INDEPENDENT_AMBULATORY_CARE_PROVIDER_SITE_OTHER): Payer: Medicare Other | Admitting: Family Medicine

## 2018-12-26 VITALS — BP 140/80 | HR 68 | Temp 97.4°F | Ht 62.0 in | Wt 167.0 lb

## 2018-12-26 DIAGNOSIS — I4891 Unspecified atrial fibrillation: Secondary | ICD-10-CM | POA: Diagnosis not present

## 2018-12-26 DIAGNOSIS — I10 Essential (primary) hypertension: Secondary | ICD-10-CM

## 2018-12-26 DIAGNOSIS — Z0000 Encounter for general adult medical examination without abnormal findings: Secondary | ICD-10-CM

## 2018-12-26 DIAGNOSIS — N390 Urinary tract infection, site not specified: Secondary | ICD-10-CM | POA: Diagnosis not present

## 2018-12-26 DIAGNOSIS — C49A3 Gastrointestinal stromal tumor of small intestine: Secondary | ICD-10-CM | POA: Diagnosis not present

## 2018-12-26 LAB — COMPREHENSIVE METABOLIC PANEL
ALT: 23 U/L (ref 0–35)
AST: 22 U/L (ref 0–37)
Albumin: 4.3 g/dL (ref 3.5–5.2)
Alkaline Phosphatase: 71 U/L (ref 39–117)
BUN: 8 mg/dL (ref 6–23)
CO2: 30 mEq/L (ref 19–32)
Calcium: 9.5 mg/dL (ref 8.4–10.5)
Chloride: 103 mEq/L (ref 96–112)
Creatinine, Ser: 0.81 mg/dL (ref 0.40–1.20)
GFR: 66.7 mL/min (ref >60.00)
Glucose, Bld: 91 mg/dL (ref 70–99)
Potassium: 4.3 mEq/L (ref 3.5–5.1)
Sodium: 140 mEq/L (ref 135–145)
Total Bilirubin: 1 mg/dL (ref 0.2–1.2)
Total Protein: 6.4 g/dL (ref 6.0–8.3)

## 2018-12-26 LAB — CBC WITH DIFFERENTIAL/PLATELET
Basophils Absolute: 0 10*3/uL (ref 0.0–0.1)
Basophils Relative: 0.9 % (ref 0.0–3.0)
Eosinophils Absolute: 0 10*3/uL (ref 0.0–0.7)
Eosinophils Relative: 0 % (ref 0.0–5.0)
HCT: 48.2 % — ABNORMAL HIGH (ref 36.0–46.0)
Hemoglobin: 16.3 g/dL — ABNORMAL HIGH (ref 12.0–15.0)
Lymphocytes Relative: 31.7 % (ref 12.0–46.0)
Lymphs Abs: 1.4 10*3/uL (ref 0.7–4.0)
MCHC: 33.8 g/dL (ref 30.0–36.0)
MCV: 92.6 fl (ref 78.0–100.0)
Monocytes Absolute: 0.4 10*3/uL (ref 0.1–1.0)
Monocytes Relative: 7.9 % (ref 3.0–12.0)
Neutro Abs: 2.7 10*3/uL (ref 1.4–7.7)
Neutrophils Relative %: 59.5 % (ref 43.0–77.0)
Platelets: 177 10*3/uL (ref 150.0–400.0)
RBC: 5.21 Mil/uL — ABNORMAL HIGH (ref 3.87–5.11)
RDW: 13.4 % (ref 11.5–15.5)
WBC: 4.6 10*3/uL (ref 4.0–10.5)

## 2018-12-26 LAB — URINALYSIS, ROUTINE W REFLEX MICROSCOPIC
Bilirubin Urine: NEGATIVE
Hgb urine dipstick: NEGATIVE
Ketones, ur: NEGATIVE
Leukocytes,Ua: NEGATIVE
Nitrite: NEGATIVE
RBC / HPF: NONE SEEN (ref 0–?)
Specific Gravity, Urine: <=1.005 — AB (ref 1.000–1.030)
Total Protein, Urine: NEGATIVE
Urine Glucose: NEGATIVE
Urobilinogen, UA: 0.2 (ref 0.0–1.0)
pH: 7 (ref 5.0–8.0)

## 2018-12-26 LAB — MAGNESIUM: Magnesium: 2.2 mg/dL (ref 1.5–2.5)

## 2018-12-26 NOTE — Progress Notes (Signed)
Subjective:    Valerie Chen is a 85 y.o. female who presents for Medicare Annual (Subsequent) preventive examination. Doing well, presents with CNA. Good spirits. End stage dementia, with known abdominal mass. Abdominal mass evaluated by [facility], will watch with no intent to treat. On no medications, besides supplements and ASA 81. No signs of UTI, pain, CP. No bowel changes. Due for a few labs. Hx of hypokalemia, with atrial fibrillation.   Review of Systems  Constitutional: Negative for chills and fever.  HENT: Negative for hearing loss and tinnitus.   Eyes: Negative for blurred vision and double vision.  Respiratory: Negative for cough.   Cardiovascular: Negative for chest pain and palpitations.  Gastrointestinal: Negative for nausea and vomiting.  Genitourinary: Negative for dysuria and urgency.  Neurological: Negative for dizziness and headaches.  Psychiatric/Behavioral: Negative for depression and suicidal ideas.   Objective:   Vitals: BP 140/80   Pulse 68   Temp (!) 97.4 F (36.3 C) (Oral)   Ht 5\' 2"  (1.575 m)   Wt 167 lb (75.8 kg)   SpO2 96%   BMI 30.54 kg/m   Body mass index is 30.54 kg/m.  Physical Exam Vitals signs and nursing note reviewed.  HENT:     Head: Normocephalic and atraumatic.  Eyes:     Pupils: Pupils are equal, round, and reactive to light.  Neck:     Musculoskeletal: Normal range of motion and neck supple.  Cardiovascular:     Rate and Rhythm: Normal rate and regular rhythm.     Heart sounds: Normal heart sounds.  Pulmonary:     Effort: Pulmonary effort is normal.  Abdominal:     Palpations: Abdomen is soft.  Skin:    General: Skin is warm.  Psychiatric:        Behavior: Behavior normal.    Social History   Tobacco Use  Smoking Status Never Smoker  Smokeless Tobacco Never Used     Patient Active Problem List   Diagnosis Date Noted  . Syncope 06/09/2018  . New onset atrial fibrillation (Valerie Chen) 06/09/2018  . URI with cough and  congestion 06/09/2018  . GIST (gastrointestinal stromal tumor) of small bowel, malignant (Valerie Chen) 01/12/2018  . Breast mass, right 11/29/2017  . Skin lesion of back 11/29/2017  . Difficulty walking 02/23/2017  . Dementia, previously on Seroquel, able to wean with family and caregiver support 01/21/2017  . Abdominal mass, monitored by Valerie Chen 01/21/2017  . NSVT (nonsustained ventricular tachycardia) (Valerie Chen) 05/11/2016  . Recurrent UTI, on daily Trimethroprim 02/01/2015  . Essential hypertension, on no medications 05/30/2013  . Coronary artery disease   . Diastolic dysfunction   . TIA (transient ischemic attack), questionable, 07/2010, with negative carotid dopplers   . History of GI bleed, with hemorrhoids, last colonoscopy 2012 at time of bleed   . Dyslipidemia, no statin   . Arthralgia of multiple joints 05/22/2009  . Vitamin D deficiency 01/27/2009   Past Surgical History:  Procedure Laterality Date  . ABDOMINAL HYSTERECTOMY    . TONSILLECTOMY     Family History  Problem Relation Age of Onset  . Heart attack Father   . Cancer Father   . Arthritis Father   . Suicidality Mother   . Depression Mother   . Heart attack Brother   . Heart disease Brother   . Pneumonia Brother   . Suicidality Son   . Hypertension Daughter   . Depression Other    Social History   Tobacco Use  . Smoking  status: Never Smoker  . Smokeless tobacco: Never Used  Substance Use Topics  . Alcohol use: No  . Drug use: Yes    Comment: hx of valium in 1980's - had 2 overdoses per daughter    Current Outpatient Medications:  .  acetaminophen (TYLENOL) 500 MG tablet, Take 1,000 mg by mouth every 6 (six) hours as needed for moderate pain., Disp: , Rfl:  .  Ascorbic Acid (VITAMIN C) 1000 MG tablet, Take 1,000 mg by mouth daily., Disp: , Rfl:  .  Cholecalciferol (VITAMIN D-3) 1000 units CAPS, Take 1,000 Units by mouth daily. , Disp: , Rfl:  .  cyanocobalamin 500 MCG tablet, Take 500 mcg by mouth daily. , Disp:  , Rfl:  .  Melatonin-Pyridoxine (MELATIN PO), Take by mouth., Disp: , Rfl:  .  Multiple Vitamin (MULTIVITAMIN WITH MINERALS) TABS tablet, Take 1 tablet by mouth daily., Disp: , Rfl:  .  OVER THE COUNTER MEDICATION, Take 1 capsule by mouth 2 (two) times daily with a meal. "Fem Dophilus" for urinary tract health, Disp: , Rfl:  .  OVER THE COUNTER MEDICATION, Take 5 mLs by mouth daily. D-Mannose powder - for urinary tract health - mix in 6 oz water and drink, Disp: , Rfl:  .  Aspirin Buf,CaCarb-MgCarb-MgO, 81 MG TABS, Take by mouth., Disp: , Rfl:   Activities of Daily Living In your present state of health, do you have any difficulty performing the following activities: 12/26/2018 06/10/2018  Hearing? N N  Vision? N N  Difficulty concentrating or making decisions? Valerie Chen  Walking or climbing stairs? Y Y  Dressing or bathing? Y Y  Comment - can do it but needs direction  Doing errands, shopping? Valerie Chen  Preparing Food and eating ? Y -  Using the Toilet? Y -  In the past six months, have you accidently leaked urine? Y -  Do you have problems with loss of bowel control? N -  Managing your Medications? Y -  Managing your Finances? Y -  Housekeeping or managing your Housekeeping? Y -  Some recent data might be hidden   Patient Care Team: Briscoe Deutscher, DO as PCP - General (Family Medicine) Nahser, Wonda Cheng, MD as PCP - Cardiology (Cardiology) Karl Luke, MD as Consulting Physician (Neurology) Rana Snare, MD as Consulting Physician (Urology) Geanie Kenning, MD (Psychiatry) Marin Olp Rudell Cobb, MD as Consulting Physician (Oncology)  Assessment:   Valerie Chen was seen today for medicare wellness.  Diagnoses and all orders for this visit:  Medicare annual wellness visit, subsequent  Essential hypertension, on no medications -     Comprehensive metabolic panel -     Magnesium  New onset atrial fibrillation (HCC)  GIST (gastrointestinal stromal tumor) of small bowel, malignant (HCC) -     CBC  with Differential/Platelet  Recurrent UTI -     Urinalysis, Routine w reflex microscopic -     Urine Culture   Exercise Activities and Dietary recommendations Current Exercise Habits: The patient does not participate in regular exercise at present, Exercise limited by: psychological condition(s)  Fall Risk Fall Risk  12/26/2018 09/25/2018 06/06/2018 06/02/2017 05/23/2017  Falls in the past year? 0 0 1 No No  Number falls in past yr: 0 0 1 - -  Injury with Fall? 0 0 0 - -   Is the patient's home free of loose throw rugs in walkways, pet beds, electrical cords, etc?   yes. Grab bars in the bathroom? yes. Handrails on the stairs?  yes. Adequate lighting? yes.  Timed Get Up and Go performed: yes, taking < 12 seconds Note: If takes > 12 seconds to complete, patient at increased risk of falling. Note if patient has slow pace, loss of balance, short strides, little or no arm swings, steadying self on walls, shuffling, not using assist device properly.   Depression Screen PHQ 2/9 Scores 12/26/2018 06/06/2018 06/02/2017 11/20/2014  PHQ - 2 Score - - 0 0  Exception Documentation Medical reason Other- indicate reason in comment box - -  Not completed - Patient not able to answer questions.  - -    Cognitive Function PATIENT HAS END-STAGE DEMENTIA  Immunization History  Administered Date(s) Administered  . Influenza, High Dose Seasonal PF 04/12/2018  . Influenza,inj,Quad PF,6+ Mos 05/11/2016, 04/04/2017  . Influenza-Unspecified 03/21/2014  . PPD Test 03/15/2011  . Pneumococcal-Unspecified 05/21/2013  . Tdap 11/28/2017   Screening Tests Health Maintenance  Topic Date Due  . DEXA SCAN  08/16/2024 (Originally 10/13/1995)  . INFLUENZA VACCINE  01/20/2019  . TETANUS/TDAP  11/29/2027  . PNA vac Low Risk Adult  Completed   Cancer Screenings: Lung: Low Dose CT Chest recommended if Age 72-80 years, 30 pack-year currently smoking OR have quit w/in 15years. Patient does not qualify. Up to date of  Bone Density/Dexa? Yes  Plan:   I have personally reviewed and noted the following in the patient's chart:   . Medical and social history . Use of alcohol, tobacco or illicit drugs  . Current medications and supplements . Functional ability and status . Nutritional status . Physical activity . Advanced directives . List of other physicians . Hospitalizations, surgeries, and ER visits in previous 12 months . Vitals . Screenings to include cognitive, depression, and falls . Referrals and appointments  In addition, I have reviewed and discussed with patient certain preventive protocols, quality metrics, and best practice recommendations. A written personalized care plan for preventive services as well as general preventive health recommendations were provided to patient.   Briscoe Deutscher, DO

## 2018-12-28 LAB — URINE CULTURE
MICRO NUMBER:: 644014
SPECIMEN QUALITY:: ADEQUATE

## 2019-02-07 ENCOUNTER — Emergency Department (HOSPITAL_COMMUNITY): Payer: Medicare Other

## 2019-02-07 ENCOUNTER — Encounter (HOSPITAL_COMMUNITY): Payer: Self-pay | Admitting: Radiology

## 2019-02-07 ENCOUNTER — Emergency Department (HOSPITAL_COMMUNITY)
Admission: EM | Admit: 2019-02-07 | Discharge: 2019-02-07 | Disposition: A | Discharge status: Home / Self Care | Payer: Medicare Other | Attending: Emergency Medicine | Admitting: Emergency Medicine

## 2019-02-07 ENCOUNTER — Ambulatory Visit: Payer: Self-pay | Admitting: *Deleted

## 2019-02-07 ENCOUNTER — Other Ambulatory Visit: Payer: Self-pay

## 2019-02-07 DIAGNOSIS — I4891 Unspecified atrial fibrillation: Secondary | ICD-10-CM | POA: Diagnosis not present

## 2019-02-07 DIAGNOSIS — R29810 Facial weakness: Secondary | ICD-10-CM | POA: Diagnosis not present

## 2019-02-07 DIAGNOSIS — R262 Difficulty in walking, not elsewhere classified: Secondary | ICD-10-CM | POA: Insufficient documentation

## 2019-02-07 DIAGNOSIS — Z8673 Personal history of transient ischemic attack (TIA), and cerebral infarction without residual deficits: Secondary | ICD-10-CM | POA: Diagnosis not present

## 2019-02-07 DIAGNOSIS — Z7982 Long term (current) use of aspirin: Secondary | ICD-10-CM | POA: Diagnosis not present

## 2019-02-07 DIAGNOSIS — Z859 Personal history of malignant neoplasm, unspecified: Secondary | ICD-10-CM | POA: Diagnosis not present

## 2019-02-07 DIAGNOSIS — F0390 Unspecified dementia without behavioral disturbance: Secondary | ICD-10-CM | POA: Diagnosis not present

## 2019-02-07 DIAGNOSIS — I10 Essential (primary) hypertension: Secondary | ICD-10-CM | POA: Diagnosis not present

## 2019-02-07 DIAGNOSIS — R531 Weakness: Secondary | ICD-10-CM | POA: Diagnosis not present

## 2019-02-07 DIAGNOSIS — I959 Hypotension, unspecified: Secondary | ICD-10-CM | POA: Diagnosis not present

## 2019-02-07 DIAGNOSIS — R05 Cough: Secondary | ICD-10-CM | POA: Insufficient documentation

## 2019-02-07 DIAGNOSIS — R270 Ataxia, unspecified: Secondary | ICD-10-CM | POA: Diagnosis not present

## 2019-02-07 DIAGNOSIS — Z79899 Other long term (current) drug therapy: Secondary | ICD-10-CM | POA: Insufficient documentation

## 2019-02-07 DIAGNOSIS — R067 Sneezing: Secondary | ICD-10-CM | POA: Diagnosis not present

## 2019-02-07 LAB — CBC WITH DIFFERENTIAL/PLATELET
Abs Immature Granulocytes: 0.01 10*3/uL (ref 0.00–0.07)
Basophils Absolute: 0 10*3/uL (ref 0.0–0.1)
Basophils Relative: 1 %
Eosinophils Absolute: 0 10*3/uL (ref 0.0–0.5)
Eosinophils Relative: 0 %
HCT: 49.4 % — ABNORMAL HIGH (ref 36.0–46.0)
Hemoglobin: 16.3 g/dL — ABNORMAL HIGH (ref 12.0–15.0)
Immature Granulocytes: 0 %
Lymphocytes Relative: 36 %
Lymphs Abs: 1.5 10*3/uL (ref 0.7–4.0)
MCH: 30.6 pg (ref 26.0–34.0)
MCHC: 33 g/dL (ref 30.0–36.0)
MCV: 92.9 fL (ref 80.0–100.0)
Monocytes Absolute: 0.4 10*3/uL (ref 0.1–1.0)
Monocytes Relative: 10 %
Neutro Abs: 2.2 10*3/uL (ref 1.7–7.7)
Neutrophils Relative %: 53 %
Platelets: 157 10*3/uL (ref 150–400)
RBC: 5.32 MIL/uL — ABNORMAL HIGH (ref 3.87–5.11)
RDW: 12.7 % (ref 11.5–15.5)
WBC: 4 10*3/uL (ref 4.0–10.5)
nRBC: 0 % (ref 0.0–0.2)

## 2019-02-07 LAB — URINALYSIS, ROUTINE W REFLEX MICROSCOPIC
Bilirubin Urine: NEGATIVE
Glucose, UA: NEGATIVE mg/dL
Hgb urine dipstick: NEGATIVE
Ketones, ur: NEGATIVE mg/dL
Leukocytes,Ua: NEGATIVE
Nitrite: NEGATIVE
Protein, ur: NEGATIVE mg/dL
Specific Gravity, Urine: 1.003 — ABNORMAL LOW (ref 1.005–1.030)
pH: 6 (ref 5.0–8.0)

## 2019-02-07 LAB — COMPREHENSIVE METABOLIC PANEL
ALT: 26 U/L (ref 0–44)
AST: 22 U/L (ref 15–41)
Albumin: 4 g/dL (ref 3.5–5.0)
Alkaline Phosphatase: 69 U/L (ref 38–126)
Anion gap: 8 (ref 5–15)
BUN: 9 mg/dL (ref 8–23)
CO2: 27 mmol/L (ref 22–32)
Calcium: 9.3 mg/dL (ref 8.9–10.3)
Chloride: 106 mmol/L (ref 98–111)
Creatinine, Ser: 0.75 mg/dL (ref 0.44–1.00)
GFR calc Af Amer: >60 mL/min (ref >60)
GFR calc non Af Amer: >60 mL/min (ref >60)
Glucose, Bld: 93 mg/dL (ref 70–99)
Potassium: 4.4 mmol/L (ref 3.5–5.1)
Sodium: 141 mmol/L (ref 135–145)
Total Bilirubin: 0.9 mg/dL (ref 0.3–1.2)
Total Protein: 6.5 g/dL (ref 6.5–8.1)

## 2019-02-07 LAB — MAGNESIUM: Magnesium: 2.5 mg/dL — ABNORMAL HIGH (ref 1.7–2.4)

## 2019-02-07 NOTE — Telephone Encounter (Signed)
Spoke to care giver advised to take to ED for evaluation.

## 2019-02-07 NOTE — Telephone Encounter (Signed)
Caretaker calling with concerns regarding the  patient's gait. Reporting she leans to the side while walking and would fall if she wasn't there to catch her.Not in any one direction, will almost fall in different directions.  This has occurred 3x this morning. Has noticed on/off since Monday. Reports some increased confusion but only with the new caretaker. No change in speech/posture. No numbness/tingling reported. Denies any pain. Voids without difficulty. LBM today. Drinking 70 ounces H20 daily, eating good. Reports vitals are normal. Transferred to PCP for appointment.  Answer Assessment - Initial Assessment Questions 1. SYMPTOM: "What is the main symptom you are concerned about?" (e.g., weakness, numbness)   Unsteady gait 2. ONSET: "When did this start?" (minutes, hours, days; while sleeping)     3 days ago 3. LAST NORMAL: "When was the last time you were normal (no symptoms)?"     Several days ago 4. PATTERN "Does this come and go, or has it been constant since it started?"  "Is it present now?"     Has been constant this morning. 5. CARDIAC SYMPTOMS: "Have you had any of the following symptoms: chest pain, difficulty breathing, palpitations?"     None of these reported by the patient.  6. NEUROLOGIC SYMPTOMS: "Have you had any of the following symptoms: headache, dizziness, vision loss, double vision, changes in speech, unsteady on your feet?"     Reports no 7. OTHER SYMPTOMS: "Do you have any other symptoms?"     no 8. PREGNANCY: "Is there any chance you are pregnant?" "When was your last menstrual period?"     na  Protocols used: NEUROLOGIC DEFICIT-A-AH

## 2019-02-07 NOTE — ED Provider Notes (Signed)
[name] Provider Note   CSN: [id number] Arrival date & time: 02/07/19  1135    History   Chief Complaint Chief Complaint  Patient presents with   Difficulty Walking    HPI Valerie Chen is a 85 y.o. female.     HPI Patient has history of dementia.  Unable to contribute to history.  Level 5 caveat applies.  Patient arrives by EMS.  Caretaker is at patient bedside.  States for the past 2 days patient has had increasing difficulty ambulating and is requiring assistance.  This worsened this morning.  No known falls or trauma.  Patient is at her baseline level of confusion.  She has had some mild sneezing and coughing but no respiratory distress.  No known fevers or chills.  No vomiting or diarrhea. Past Medical History:  Diagnosis Date   Alzheimer disease ([place])    Arthritis    CAD (coronary artery disease)    Probable CAD, nuclear February, 2012, possible anteroseptal and inferoseptal ischemia but the study was technically difficult   [facility])    Chest pain    Hospital February, 2012, nuclear, possible anteroseptal and inferoseptal ischemia, technically difficult, medical therapy   Dementia ([place])    Significant   Diastolic dysfunction    Echo, February, 9833   Diastolic dysfunction    DNR (do not resuscitate)    Dyslipidemia    Edema    hospitalizations September, 2013, some fluid overload   Ejection fraction    EF 55%, echo, February, 2012  //   EF 55-60%, echo, February 25, 2012 // Echo 12/17: EF 60-65, no RWMA, Gr 1 DD, normal RVSF   Essential hypertension    GI bleed    February, 2012 incomplete colonoscopy, hemorrhoids, needs complete colonoscopy   GIST (gastrointestinal stromal tumor) of small bowel, malignant ([place]) 01/12/2018   Hx: UTI (urinary tract infection)    Hyperlipidemia    Leg pain, bilateral    Major depression    2 overdoses in [place]    27 year   Orthostatic hypotension     Suicide attempt ([place]) 1985   2 attempts with Valium   TIA (transient ischemic attack)       ???question of a TIA in February, 2012, carotid Doppler, August 11, 2010 no significant abnormalities    Patient Active Problem List   Diagnosis Date Noted   Syncope 06/09/2018   New onset atrial fibrillation ([place]) 06/09/2018   URI with cough and congestion 06/09/2018   GIST (gastrointestinal stromal tumor) of small bowel, malignant ([place]) 01/12/2018   Breast mass, right 11/29/2017   Skin lesion of back 11/29/2017   Difficulty walking 02/23/2017   Dementia, previously on Seroquel, able to wean with family and caregiver support 01/21/2017   Abdominal mass, monitored by [facility] 01/21/2017   NSVT (nonsustained ventricular tachycardia) ([place]) 05/11/2016   Recurrent UTI, on daily Trimethroprim 02/01/2015   Essential hypertension, on no medications 05/30/2013   Coronary artery disease    Diastolic dysfunction    TIA (transient ischemic attack), questionable, 07/2010, with negative carotid dopplers    History of GI bleed, with hemorrhoids, last colonoscopy 2012 at time of bleed    Dyslipidemia, no statin    Arthralgia of multiple joints 05/22/2009   Vitamin D deficiency 01/27/2009    Past Surgical History:  Procedure Laterality Date   ABDOMINAL HYSTERECTOMY     TONSILLECTOMY       OB History   No  obstetric history on file.      Home Medications    Prior to Admission medications   Medication Sig Start Date End Date Taking? Authorizing Provider  Ascorbic Acid (VITAMIN C) 1000 MG tablet Take 1,000 mg by mouth daily.   Yes [provider]  aspirin EC 81 MG tablet Take 81 mg by mouth daily.   Yes [provider]  Cholecalciferol (VITAMIN D-3) 1000 units CAPS Take 2,000 Units by mouth daily.    Yes [provider]  cyanocobalamin 500 MCG tablet Take 500 mcg by mouth daily.    Yes [provider]  MELATONIN ER PO Take 2 mg by  mouth daily at 6 PM.   Yes [provider]  Multiple Vitamin (MULTIVITAMIN WITH MINERALS) TABS tablet Take 1 tablet by mouth daily.   Yes [provider]  Omega-3 Fatty Acids (OMEGA-3 FISH OIL) 300 MG CAPS Take 1 capsule by mouth daily.   Yes [provider]  OVER THE COUNTER MEDICATION Take 1 capsule by mouth 2 (two) times daily with a meal. "Fem Dophilus" for urinary tract health   Yes [provider]  OVER THE COUNTER MEDICATION Take 5 mLs by mouth daily. D-Mannose powder - for urinary tract health - mix in 6 oz water and drink   Yes [provider]  acetaminophen (TYLENOL) 500 MG tablet Take 1,000 mg by mouth every 6 (six) hours as needed for moderate pain.    [provider]    Family History Family History  Problem Relation Age of Onset   Heart attack Father    Cancer Father    Arthritis Father    Suicidality Mother    Depression Mother    Heart attack Brother    Heart disease Brother    Pneumonia Brother    Suicidality Son    Hypertension Daughter    Depression Other     Social History Social History   Tobacco Use   Smoking status: Never Smoker   Smokeless tobacco: Never Used  Substance Use Topics   Alcohol use: No   Drug use: Yes    Comment: hx of valium in 1980's - had 2 overdoses per daughter     Allergies   Levaquin [levofloxacin], Rocephin [ceftriaxone], and Statins   Review of Systems Review of Systems  Unable to perform ROS: Dementia     Physical Exam Updated Vital Signs BP (!) 142/68    Pulse 63    Temp (!) 97.1 F (36.2 C) (Oral)    Resp 19    SpO2 98%   Physical Exam Vitals signs and nursing note reviewed.  Constitutional:      General: She is not in acute distress.    Appearance: Normal appearance. She is well-developed. She is not ill-appearing.  HENT:     Head: Normocephalic and atraumatic.     Comments: No facial asymmetry.    Nose: Nose normal.  Eyes:      Extraocular Movements: Extraocular movements intact.     Pupils: Pupils are equal, round, and reactive to light.  Neck:     Musculoskeletal: Normal range of motion and neck supple. No neck rigidity or muscular tenderness.  Cardiovascular:     Rate and Rhythm: Bradycardia present. Rhythm irregular.  Pulmonary:     Effort: Pulmonary effort is normal. No respiratory distress.     Breath sounds: Normal breath sounds. No stridor. No wheezing, rhonchi or rales.  Chest:     Chest wall: No tenderness.  Abdominal:     General: Bowel sounds are normal. There is no distension.     Palpations: Abdomen is soft.     Tenderness: There is no abdominal tenderness. There is no guarding or rebound.  Musculoskeletal: Normal range of motion.        General: No swelling, tenderness, deformity or signs of injury.     Right lower leg: No edema.     Left lower leg: No edema.  Lymphadenopathy:     Cervical: No cervical adenopathy.  Skin:    General: Skin is warm and dry.     Capillary Refill: Capillary refill takes less than 2 seconds.     Findings: No erythema or rash.  Neurological:     General: No focal deficit present.     Mental Status: She is alert.     Comments: 5/5 motor in all extremities.  Sensation to light touch grossly intact.  Patient is oriented to self.  Awake and alert and following commands.  Psychiatric:        Behavior: Behavior normal.      ED Treatments / Results  Labs (all labs ordered are listed, but only abnormal results are displayed) Labs Reviewed  CBC WITH DIFFERENTIAL/PLATELET - Abnormal; Notable for the following components:      Result Value   RBC 5.32 (*)    Hemoglobin 16.3 (*)    HCT 49.4 (*)    All other components within normal limits  URINALYSIS, ROUTINE W REFLEX MICROSCOPIC - Abnormal; Notable for the following components:   Color, Urine STRAW (*)    Specific Gravity, Urine 1.003 (*)    All other components within normal limits  MAGNESIUM - Abnormal;  Notable for the following components:   Magnesium 2.5 (*)    All other components within normal limits  COMPREHENSIVE METABOLIC PANEL    EKG EKG Interpretation  Date/Time:  Wednesday February 07 2019 11:55:46 EDT Ventricular Rate:  57 PR Interval:    QRS Duration: 92 QT Interval:  440 QTC Calculation: 414 R Axis:   128 Text Interpretation:  Atrial fibrillation Right axis deviation Low voltage, precordial leads Minimal ST elevation, anterior leads Baseline wander in lead(s) II III aVF Confirmed by Julianne Rice [phone]) on 02/07/2019 12:46:24 PM   Radiology Ct Head Wo Contrast  Result Date: 02/07/2019 CLINICAL DATA:  85 year old with acute onset of ataxia and abnormal gait that began at approximately 8 o'clock a.m. this morning, requiring assistance with walking. Patient is usually able to walk without assistance. Current history of dementia. EXAM: CT HEAD WITHOUT CONTRAST TECHNIQUE: Contiguous axial images were obtained from the base of the skull through the vertex without intravenous contrast. COMPARISON:  04/02/2018 and earlier. FINDINGS: Brain: Moderate cortical, deep and cerebellar atrophy, unchanged. Severe changes of small vessel disease of the white matter diffusely, unchanged. Asymmetric LATERAL ventricles, LEFT greater than RIGHT, unchanged and felt to be developmental in origin. No mass lesion. No midline shift. No acute hemorrhage or hematoma. No extra-axial fluid collections. No evidence of acute infarction. Vascular: Mild BILATERAL carotid siphon and BILATERAL vertebral artery atherosclerosis. No hyperdense vessel. Skull: No skull fracture or other focal osseous abnormality involving the skull. Sinuses/Orbits: Very small, insignificant mucous retention cyst or polyp involving the LEFT frontal sinus. Remaining visualized paranasal sinuses well aerated. BILATERAL mastoid air cells and BILATERAL middle ear cavities well-aerated. Visualized orbits and globes unremarkable. Other: None.  IMPRESSION: 1. No acute intracranial abnormality. 2. Stable moderate age related generalized atrophy and severe chronic microvascular ischemic  changes of the white matter. Electronically Signed   By: Evangeline Dakin M.D.   On: 02/07/2019 13:33   Dg Chest Port 1 View  Result Date: 02/07/2019 CLINICAL DATA:  Cough. EXAM: PORTABLE CHEST 1 VIEW COMPARISON:  Radiographs of June 16, 2018. FINDINGS: Stable cardiomediastinal silhouette. No pneumothorax or pleural effusion is noted. Atherosclerosis of thoracic aorta is noted. Both lungs are clear. The visualized skeletal structures are unremarkable. IMPRESSION: No active disease. Aortic Atherosclerosis (ICD10-I70.0). Electronically Signed   By: Marijo Conception M.D.   On: 02/07/2019 13:29    Procedures Procedures (including critical care time)  Medications Ordered in ED Medications - No data to display   Initial Impression / Assessment and Plan / ED Course  I have reviewed the triage vital signs and the nursing notes.  Pertinent labs & imaging results that were available during my care of the patient were reviewed by me and considered in my medical decision making (see chart for details).        Patient is well-appearing.  Appears to have normal neurologic exam.  CT head without acute findings.  No evidence of urinary tract infection.  Patient is able to transfer and walk with minimal assistance.  Vital signs are stable.  Patient has been sufficiently screened he can follow-up with her primary physician.  Final Clinical Impressions(s) / ED Diagnoses   Final diagnoses:  Difficulty walking  Dementia without behavioral disturbance, unspecified dementia type [facility])    ED Discharge Orders    None       Julianne Rice, MD 02/07/19 1517

## 2019-02-07 NOTE — ED Notes (Signed)
Caregiver at bedside

## 2019-02-07 NOTE — Telephone Encounter (Signed)
See below

## 2019-02-07 NOTE — ED Triage Notes (Signed)
Pt BIBA from home  Per EMS Pt has home health caregiver, pt with hx of dementia.  CNA reports pt with abnormal gait appx 0800.  Pt assited to ambulate to ambulance, normally able to walk without assistance.  Pt with hx of frequent UTI.

## 2019-02-07 NOTE — ED Notes (Signed)
Pt assisted, one person assistance, to bedside commode.  Pt able to follow commands, void when asked.  Pt assisted back to bed.    Pt with c/o neck pain, stating "its not too bad."

## 2019-02-08 NOTE — Patient Instructions (Addendum)
Health Maintenance Due  Topic Date Due  . INFLUENZA VACCINE  01/20/2019

## 2019-02-08 NOTE — Progress Notes (Signed)
Valerie Chen is a 85 y.o. female is here for follow up.  History of Present Illness:   Valerie Chen, RMA, acting as scribe for Dr. Briscoe Deutscher.   HPI:   Patient in office for follow up from ED visit on 02/07/2019: Patient is well-appearing.  Appears to have normal neurologic exam.  CT head without acute findings.  No evidence of urinary tract infection.  Patient is able to transfer and walk with minimal assistance.  Vital signs are stable.  Patient has been sufficiently screened he can follow-up with her primary physician.  ED note reviewed.  Patient with end-stage dementia and abdominal cancer, with negative work-up in the emergency department for altered walking.  She is alert today.  Caregiver is with her.  Also spoke to her daughter over the phone.  Sounds like she may be having some allergy issues with right ear pain.  Shuffling more while walking.  Has been sitting a lot more often on the weekends.  Has a new caregiver on the weekends and this has been somewhat disruptive for her.  Health Maintenance Due  Topic Date Due  . INFLUENZA VACCINE  01/20/2019   Depression screen [facility] 2/9 06/02/2017 11/20/2014  Decreased Interest 0 0  Down, Depressed, Hopeless 0 0  PHQ - 2 Score 0 0   PMHx, SurgHx, SocialHx, FamHx, Medications, and Allergies were reviewed in the Visit Navigator and updated as appropriate.   Patient Active Problem List   Diagnosis Date Noted  . Syncope 06/09/2018  . New onset atrial fibrillation ([place]) 06/09/2018  . URI with cough and congestion 06/09/2018  . GIST (gastrointestinal stromal tumor) of small bowel, malignant ([place]) 01/12/2018  . Breast mass, right 11/29/2017  . Skin lesion of back 11/29/2017  . Difficulty walking 02/23/2017  . Dementia, previously on Seroquel, able to wean with family and caregiver support 01/21/2017  . Abdominal mass, monitored by [facility] 01/21/2017  . NSVT (nonsustained ventricular tachycardia) ([place]) 05/11/2016  . Recurrent UTI, on  daily Trimethroprim 02/01/2015  . Essential hypertension, on no medications 05/30/2013  . Coronary artery disease   . Diastolic dysfunction   . TIA (transient ischemic attack), questionable, 07/2010, with negative carotid dopplers   . History of GI bleed, with hemorrhoids, last colonoscopy 2012 at time of bleed   . Dyslipidemia, no statin   . Arthralgia of multiple joints 05/22/2009  . Vitamin D deficiency 01/27/2009   Social History   Tobacco Use  . Smoking status: Never Smoker  . Smokeless tobacco: Never Used  Substance Use Topics  . Alcohol use: No  . Drug use: Yes    Comment: hx of valium in 1980's - had 2 overdoses per daughter   Current Medications and Allergies   Current Outpatient Medications:  .  acetaminophen (TYLENOL) 500 MG tablet, Take 1,000 mg by mouth every 6 (six) hours as needed for moderate pain., Disp: , Rfl:  .  Ascorbic Acid (VITAMIN C) 1000 MG tablet, Take 1,000 mg by mouth daily., Disp: , Rfl:  .  aspirin EC 81 MG tablet, Take 81 mg by mouth daily., Disp: , Rfl:  .  Cholecalciferol (VITAMIN D-3) 1000 units CAPS, Take 2,000 Units by mouth daily. , Disp: , Rfl:  .  cyanocobalamin 500 MCG tablet, Take 500 mcg by mouth daily. , Disp: , Rfl:  .  MELATONIN ER PO, Take 2 mg by mouth daily at 6 PM., Disp: , Rfl:  .  Multiple Vitamin (MULTIVITAMIN WITH MINERALS) TABS tablet, Take 1 tablet  by mouth daily., Disp: , Rfl:  .  Omega-3 Fatty Acids (OMEGA-3 FISH OIL) 300 MG CAPS, Take 1 capsule by mouth daily., Disp: , Rfl:  .  OVER THE COUNTER MEDICATION, Take 1 capsule by mouth 2 (two) times daily with a meal. "Fem Dophilus" for urinary tract health, Disp: , Rfl:  .  OVER THE COUNTER MEDICATION, Take 5 mLs by mouth daily. D-Mannose powder - for urinary tract health - mix in 6 oz water and drink, Disp: , Rfl:    Allergies  Allergen Reactions  . Levaquin [Levofloxacin] Other (See Comments)    Altered mental status   . Rocephin [Ceftriaxone] Other (See Comments)     Uncontrollable chills/shaking, dizziness, and disorientation after receiving this (??)  . Statins Other (See Comments)    Severe myalgias   Review of Systems   Pertinent items are noted in the HPI. Otherwise, a complete ROS is negative.  Vitals   Vitals:   02/09/19 1017  BP: 122/70  Pulse: (!) 59  Temp: 97.8 F (36.6 C)  TempSrc: Temporal  SpO2: 98%  Weight: 166 lb 12.8 oz (75.7 kg)  Height: 5\' 2"  (1.575 m)     Body mass index is 30.51 kg/m.  Physical Exam   Physical Exam Vitals signs and nursing note reviewed.  HENT:     Head: Normocephalic and atraumatic.     Right Ear: A middle ear effusion is present.     Left Ear: Tympanic membrane normal.     Nose: Nose normal.  Eyes:     Pupils: Pupils are equal, round, and reactive to light.  Neck:     Musculoskeletal: Normal range of motion and neck supple.  Cardiovascular:     Rate and Rhythm: Normal rate. Rhythm irregular.     Heart sounds: Normal heart sounds.  Pulmonary:     Effort: Pulmonary effort is normal.  Abdominal:     Palpations: Abdomen is soft.  Skin:    General: Skin is warm.  Neurological:     General: No focal deficit present.     Mental Status: Mental status is at baseline.  Psychiatric:        Mood and Affect: Mood normal.        Behavior: Behavior normal.    Assessment and Plan   Valerie Chen was seen today for follow-up.  Diagnoses and all orders for this visit:  Dysfunction of right eustachian tube  Need for immunization against influenza   . Orders and follow up as documented in [place], reviewed diet, exercise and weight control, cardiovascular risk and specific lipid/LDL goals reviewed, reviewed medications and side effects in detail.  . Reviewed expectations re: course of current medical issues. . Outlined signs and symptoms indicating need for more acute intervention. . Patient verbalized understanding and all questions were answered. . Patient received an After Visit Summary.  CMA  served as Education administrator during this visit. History, Physical, and Plan performed by medical provider. The above documentation has been reviewed and is accurate and complete. Briscoe Deutscher, D.O.  Briscoe Deutscher, DO [place] 02/09/2019

## 2019-02-09 ENCOUNTER — Ambulatory Visit (INDEPENDENT_AMBULATORY_CARE_PROVIDER_SITE_OTHER): Payer: Medicare Other | Admitting: Family Medicine

## 2019-02-09 ENCOUNTER — Encounter: Payer: Self-pay | Admitting: Family Medicine

## 2019-02-09 ENCOUNTER — Other Ambulatory Visit: Payer: Self-pay

## 2019-02-09 VITALS — BP 122/70 | HR 59 | Temp 97.8°F | Ht 62.0 in | Wt 166.8 lb

## 2019-02-09 DIAGNOSIS — Z23 Encounter for immunization: Secondary | ICD-10-CM | POA: Diagnosis not present

## 2019-02-09 DIAGNOSIS — H6981 Other specified disorders of Eustachian tube, right ear: Secondary | ICD-10-CM

## 2019-02-09 DIAGNOSIS — H6991 Unspecified Eustachian tube disorder, right ear: Secondary | ICD-10-CM

## 2019-02-09 DIAGNOSIS — I2510 Atherosclerotic heart disease of native coronary artery without angina pectoris: Secondary | ICD-10-CM | POA: Diagnosis not present

## 2019-02-09 NOTE — Progress Notes (Signed)
Per orders of Dr. Juleen China, injection of Fluad Quadrivalent (high dose) given by Brendell L Tyus in right deltoid. Patient tolerated injection well.

## 2019-02-15 ENCOUNTER — Other Ambulatory Visit: Payer: Self-pay | Admitting: General Surgery

## 2019-02-15 DIAGNOSIS — K6389 Other specified diseases of intestine: Secondary | ICD-10-CM

## 2019-02-23 ENCOUNTER — Other Ambulatory Visit: Payer: 59

## 2019-02-27 ENCOUNTER — Ambulatory Visit
Admission: RE | Admit: 2019-02-27 | Discharge: 2019-02-27 | Disposition: A | Discharge status: Home / Self Care | Payer: Medicare Other | Source: Ambulatory Visit | Attending: General Surgery | Admitting: General Surgery

## 2019-02-27 DIAGNOSIS — K6389 Other specified diseases of intestine: Secondary | ICD-10-CM

## 2019-02-27 DIAGNOSIS — C49A9 Gastrointestinal stromal tumor of other sites: Secondary | ICD-10-CM | POA: Diagnosis not present

## 2019-02-27 MED ORDER — IOPAMIDOL (ISOVUE-300) INJECTION 61%
100.0000 mL | Freq: Once | INTRAVENOUS | Status: AC | PRN
  Administered 2019-02-27: 100 mL via INTRAVENOUS

## 2019-03-02 ENCOUNTER — Other Ambulatory Visit: Payer: Self-pay

## 2019-03-02 ENCOUNTER — Encounter (HOSPITAL_COMMUNITY): Payer: Self-pay

## 2019-03-02 ENCOUNTER — Emergency Department (HOSPITAL_COMMUNITY): Payer: Medicare Other

## 2019-03-02 ENCOUNTER — Emergency Department (HOSPITAL_COMMUNITY)
Admission: EM | Admit: 2019-03-02 | Discharge: 2019-03-02 | Disposition: A | Discharge status: Home / Self Care | Payer: Medicare Other | Source: Home / Self Care | Attending: Emergency Medicine | Admitting: Emergency Medicine

## 2019-03-02 ENCOUNTER — Telehealth: Payer: Self-pay | Admitting: Family Medicine

## 2019-03-02 DIAGNOSIS — I959 Hypotension, unspecified: Secondary | ICD-10-CM | POA: Diagnosis not present

## 2019-03-02 DIAGNOSIS — S79911A Unspecified injury of right hip, initial encounter: Secondary | ICD-10-CM | POA: Diagnosis not present

## 2019-03-02 DIAGNOSIS — G9341 Metabolic encephalopathy: Secondary | ICD-10-CM | POA: Diagnosis not present

## 2019-03-02 DIAGNOSIS — R410 Disorientation, unspecified: Secondary | ICD-10-CM | POA: Diagnosis not present

## 2019-03-02 DIAGNOSIS — L089 Local infection of the skin and subcutaneous tissue, unspecified: Secondary | ICD-10-CM | POA: Diagnosis not present

## 2019-03-02 DIAGNOSIS — Z515 Encounter for palliative care: Secondary | ICD-10-CM | POA: Diagnosis not present

## 2019-03-02 DIAGNOSIS — Z7982 Long term (current) use of aspirin: Secondary | ICD-10-CM | POA: Insufficient documentation

## 2019-03-02 DIAGNOSIS — N390 Urinary tract infection, site not specified: Secondary | ICD-10-CM

## 2019-03-02 DIAGNOSIS — Z79899 Other long term (current) drug therapy: Secondary | ICD-10-CM | POA: Insufficient documentation

## 2019-03-02 DIAGNOSIS — R4020 Unspecified coma: Secondary | ICD-10-CM | POA: Diagnosis not present

## 2019-03-02 DIAGNOSIS — A4151 Sepsis due to Escherichia coli [E. coli]: Secondary | ICD-10-CM | POA: Diagnosis not present

## 2019-03-02 DIAGNOSIS — K631 Perforation of intestine (nontraumatic): Secondary | ICD-10-CM | POA: Diagnosis not present

## 2019-03-02 DIAGNOSIS — Z20828 Contact with and (suspected) exposure to other viral communicable diseases: Secondary | ICD-10-CM | POA: Diagnosis not present

## 2019-03-02 DIAGNOSIS — I10 Essential (primary) hypertension: Secondary | ICD-10-CM | POA: Insufficient documentation

## 2019-03-02 DIAGNOSIS — A419 Sepsis, unspecified organism: Secondary | ICD-10-CM | POA: Diagnosis not present

## 2019-03-02 DIAGNOSIS — Z66 Do not resuscitate: Secondary | ICD-10-CM | POA: Diagnosis not present

## 2019-03-02 DIAGNOSIS — Z859 Personal history of malignant neoplasm, unspecified: Secondary | ICD-10-CM | POA: Insufficient documentation

## 2019-03-02 DIAGNOSIS — R531 Weakness: Secondary | ICD-10-CM | POA: Diagnosis not present

## 2019-03-02 DIAGNOSIS — I4891 Unspecified atrial fibrillation: Secondary | ICD-10-CM | POA: Diagnosis not present

## 2019-03-02 DIAGNOSIS — W19XXXA Unspecified fall, initial encounter: Secondary | ICD-10-CM | POA: Diagnosis not present

## 2019-03-02 DIAGNOSIS — K6389 Other specified diseases of intestine: Secondary | ICD-10-CM | POA: Diagnosis not present

## 2019-03-02 DIAGNOSIS — R918 Other nonspecific abnormal finding of lung field: Secondary | ICD-10-CM | POA: Diagnosis not present

## 2019-03-02 DIAGNOSIS — R4182 Altered mental status, unspecified: Secondary | ICD-10-CM | POA: Diagnosis not present

## 2019-03-02 DIAGNOSIS — I2510 Atherosclerotic heart disease of native coronary artery without angina pectoris: Secondary | ICD-10-CM | POA: Insufficient documentation

## 2019-03-02 DIAGNOSIS — K659 Peritonitis, unspecified: Secondary | ICD-10-CM | POA: Diagnosis not present

## 2019-03-02 LAB — COMPREHENSIVE METABOLIC PANEL
ALT: 27 U/L (ref 0–44)
AST: 23 U/L (ref 15–41)
Albumin: 3.7 g/dL (ref 3.5–5.0)
Alkaline Phosphatase: 68 U/L (ref 38–126)
Anion gap: 7 (ref 5–15)
BUN: 11 mg/dL (ref 8–23)
CO2: 27 mmol/L (ref 22–32)
Calcium: 9.2 mg/dL (ref 8.9–10.3)
Chloride: 101 mmol/L (ref 98–111)
Creatinine, Ser: 0.77 mg/dL (ref 0.44–1.00)
GFR calc Af Amer: >60 mL/min (ref >60)
GFR calc non Af Amer: >60 mL/min (ref >60)
Glucose, Bld: 123 mg/dL — ABNORMAL HIGH (ref 70–99)
Potassium: 3.8 mmol/L (ref 3.5–5.1)
Sodium: 135 mmol/L (ref 135–145)
Total Bilirubin: 1.6 mg/dL — ABNORMAL HIGH (ref 0.3–1.2)
Total Protein: 6.5 g/dL (ref 6.5–8.1)

## 2019-03-02 LAB — CBC WITH DIFFERENTIAL/PLATELET
Abs Immature Granulocytes: 0.03 10*3/uL (ref 0.00–0.07)
Basophils Absolute: 0 10*3/uL (ref 0.0–0.1)
Basophils Relative: 0 %
Eosinophils Absolute: 0 10*3/uL (ref 0.0–0.5)
Eosinophils Relative: 0 %
HCT: 45.9 % (ref 36.0–46.0)
Hemoglobin: 15.2 g/dL — ABNORMAL HIGH (ref 12.0–15.0)
Immature Granulocytes: 0 %
Lymphocytes Relative: 10 %
Lymphs Abs: 1.2 10*3/uL (ref 0.7–4.0)
MCH: 30.8 pg (ref 26.0–34.0)
MCHC: 33.1 g/dL (ref 30.0–36.0)
MCV: 93.1 fL (ref 80.0–100.0)
Monocytes Absolute: 1 10*3/uL (ref 0.1–1.0)
Monocytes Relative: 8 %
Neutro Abs: 9.4 10*3/uL — ABNORMAL HIGH (ref 1.7–7.7)
Neutrophils Relative %: 82 %
Platelets: 152 10*3/uL (ref 150–400)
RBC: 4.93 MIL/uL (ref 3.87–5.11)
RDW: 12.9 % (ref 11.5–15.5)
WBC: 11.6 10*3/uL — ABNORMAL HIGH (ref 4.0–10.5)
nRBC: 0 % (ref 0.0–0.2)

## 2019-03-02 LAB — URINALYSIS, ROUTINE W REFLEX MICROSCOPIC
Bilirubin Urine: NEGATIVE
Glucose, UA: NEGATIVE mg/dL
Hgb urine dipstick: NEGATIVE
Ketones, ur: NEGATIVE mg/dL
Nitrite: POSITIVE — AB
Protein, ur: NEGATIVE mg/dL
Specific Gravity, Urine: 1.01 (ref 1.005–1.030)
pH: 6 (ref 5.0–8.0)

## 2019-03-02 MED ORDER — NITROFURANTOIN MONOHYD MACRO 100 MG PO CAPS: 100.0000 mg | ORAL_CAPSULE | Freq: Two times a day (BID) | ORAL | 0 refills | Status: DC

## 2019-03-02 MED ORDER — ACETAMINOPHEN 500 MG PO TABS
1000.0000 mg | ORAL_TABLET | Freq: Once | ORAL | Status: AC
  Administered 2019-03-02: 1000 mg via ORAL
  Filled 2019-03-02: qty 2

## 2019-03-02 NOTE — Telephone Encounter (Signed)
Patients daughter Rolm Bookbinder is calling to ask Dr. Juleen China if an order can be placed for bed rails and wheelchair.    Patient rolled out of her bed last night.  Please advise CB- [phone]

## 2019-03-02 NOTE — Discharge Instructions (Signed)
Follow-up with your primary doctor.  Return here as needed.  There was a trace of white blood cells and positive nitrites which could indicate infection.

## 2019-03-02 NOTE — ED Triage Notes (Signed)
Per EMS, pt comes from home and has been feeling weaker each day. Starting midnight she became confused and fell out of bed. Skin tears to left elbow and bruises to left knee. Hx of dementia and Alzheimer's. Per EMS, pt had fluid in both ears a few weeks ago.

## 2019-03-02 NOTE — ED Provider Notes (Signed)
[facility] DEPT Provider Note   CSN: [id number] Arrival date & time: 03/02/19  0941     History   Chief Complaint Chief Complaint  Patient presents with  . Weakness  . Altered Mental Status    HPI Valerie Chen is a 85 y.o. female.     HPI Patient presents to the emergency department with alterations in her normal mentation.  The patient had an episode earlier where she seemed more confused and not following commands.  This happened several times over the last few weeks.  The family is concerned that she could be having some sort of neurological issues.  Patient has been evaluated for this previously.  Patient has been otherwise stable.  The patient denies chest pain, shortness of breath, headache,blurred vision, neck pain, fever, cough, weakness, numbness, dizziness, anorexia, edema, abdominal pain, nausea, vomiting, diarrhea, rash, back pain, dysuria, hematemesis, bloody stool, near syncope, or syncope. Past Medical History:  Diagnosis Date  . Alzheimer disease ([place])   . Arthritis   . CAD (coronary artery disease)    Probable CAD, nuclear February, 2012, possible anteroseptal and inferoseptal ischemia but the study was technically difficult  . Cancer ([place])   . Chest pain    Hospital February, 2012, nuclear, possible anteroseptal and inferoseptal ischemia, technically difficult, medical therapy  . Dementia ([place])    Significant  . Diastolic dysfunction    Echo, February, 2012  . Diastolic dysfunction   . DNR (do not resuscitate)   . Dyslipidemia   . Edema    hospitalizations September, 2013, some fluid overload  . Ejection fraction    EF 55%, echo, February, 2012  //   EF 55-60%, echo, February 25, 2012 // Echo 12/17: EF 60-65, no RWMA, Gr 1 DD, normal RVSF  . Essential hypertension   . GI bleed    February, 2012 incomplete colonoscopy, hemorrhoids, needs complete colonoscopy  . GIST (gastrointestinal stromal tumor) of small bowel,  malignant ([place]) 01/12/2018  . Hx: UTI (urinary tract infection)   . Hyperlipidemia   . Leg pain, bilateral   . Major depression    2 overdoses in 1975 & 1977  . Migraines    25 year  . Orthostatic hypotension   . Suicide attempt ([place]) 1985   2 attempts with Valium  . TIA (transient ischemic attack)       ???question of a TIA in February, 2012, carotid Doppler, August 11, 2010 no significant abnormalities    Patient Active Problem List   Diagnosis Date Noted  . Syncope 06/09/2018  . New onset atrial fibrillation ([place]) 06/09/2018  . URI with cough and congestion 06/09/2018  . GIST (gastrointestinal stromal tumor) of small bowel, malignant ([place]) 01/12/2018  . Breast mass, right 11/29/2017  . Skin lesion of back 11/29/2017  . Difficulty walking 02/23/2017  . Dementia, previously on Seroquel, able to wean with family and caregiver support 01/21/2017  . Abdominal mass, monitored by [facility] 01/21/2017  . NSVT (nonsustained ventricular tachycardia) ([place]) 05/11/2016  . Recurrent UTI, on daily Trimethroprim 02/01/2015  . Essential hypertension, on no medications 05/30/2013  . Coronary artery disease   . Diastolic dysfunction   . TIA (transient ischemic attack), questionable, 07/2010, with negative carotid dopplers   . History of GI bleed, with hemorrhoids, last colonoscopy 2012 at time of bleed   . Dyslipidemia, no statin   . Arthralgia of multiple joints 05/22/2009  . Vitamin D deficiency 01/27/2009    Past Surgical History:  Procedure Laterality Date  .  ABDOMINAL HYSTERECTOMY    . TONSILLECTOMY       OB History   No obstetric history on file.      Home Medications    Prior to Admission medications   Medication Sig Start Date End Date Taking? Authorizing Provider  acetaminophen (TYLENOL) 500 MG tablet Take 1,000 mg by mouth every 6 (six) hours as needed for moderate pain.   Yes [provider]  Ascorbic Acid (VITAMIN C) 1000 MG tablet Take 1,000 mg by mouth  daily.   Yes [provider]  aspirin EC 81 MG tablet Take 81 mg by mouth daily.   Yes [provider]  Cholecalciferol (VITAMIN D-3) 1000 units CAPS Take 2,000 Units by mouth daily.    Yes [provider]  cyanocobalamin 500 MCG tablet Take 500 mcg by mouth daily.    Yes [provider]  MELATONIN ER PO Take 2 mg by mouth daily at 6 PM.   Yes [provider]  Multiple Vitamin (MULTIVITAMIN WITH MINERALS) TABS tablet Take 1 tablet by mouth daily.   Yes [provider]  Omega-3 Fatty Acids (OMEGA-3 FISH OIL) 300 MG CAPS Take 2 capsules by mouth daily.    Yes [provider]  OVER THE COUNTER MEDICATION Take 1 capsule by mouth 2 (two) times daily with a meal. "Fem Dophilus" for urinary tract health   Yes [provider]  OVER THE COUNTER MEDICATION Take 5 mLs by mouth daily. D-Mannose powder - for urinary tract health - mix in 6 oz water and drink   Yes [provider]    Family History Family History  Problem Relation Age of Onset  . Heart attack Father   . Cancer Father   . Arthritis Father   . Suicidality Mother   . Depression Mother   . Heart attack Brother   . Heart disease Brother   . Pneumonia Brother   . Suicidality Son   . Hypertension Daughter   . Depression Other     Social History Social History   Tobacco Use  . Smoking status: Never Smoker  . Smokeless tobacco: Never Used  Substance Use Topics  . Alcohol use: No  . Drug use: Yes    Comment: hx of valium in 1980's - had 2 overdoses per daughter     Allergies   Levaquin [levofloxacin], Rocephin [ceftriaxone], and Statins   Review of Systems Review of Systems  All other systems negative except as documented in the HPI. All pertinent positives and negatives as reviewed in the HPI. Physical Exam Updated Vital Signs BP 106/63   Pulse 79   Temp 98.1 F (36.7 C) (Oral)   Resp 14   SpO2 94%   Physical Exam Vitals signs and  nursing note reviewed.  Constitutional:      General: She is not in acute distress.    Appearance: She is well-developed.  HENT:     Head: Normocephalic and atraumatic.  Eyes:     Pupils: Pupils are equal, round, and reactive to light.  Neck:     Musculoskeletal: Normal range of motion and neck supple.  Cardiovascular:     Rate and Rhythm: Normal rate and regular rhythm.     Heart sounds: Normal heart sounds. No murmur. No friction rub. No gallop.   Pulmonary:     Effort: Pulmonary effort is normal. No respiratory distress.     Breath sounds: Normal breath sounds. No wheezing.  Abdominal:     General: Bowel  sounds are normal. There is no distension.     Palpations: Abdomen is soft.     Tenderness: There is no abdominal tenderness.  Skin:    General: Skin is warm and dry.     Capillary Refill: Capillary refill takes less than 2 seconds.     Findings: No erythema or rash.  Neurological:     Mental Status: She is alert. Mental status is at baseline.     Sensory: No sensory deficit.     Motor: No weakness or abnormal muscle tone.     Coordination: Coordination normal.     Deep Tendon Reflexes: Reflexes normal.  Psychiatric:        Behavior: Behavior normal.      ED Treatments / Results  Labs (all labs ordered are listed, but only abnormal results are displayed) Labs Reviewed  COMPREHENSIVE METABOLIC PANEL - Abnormal; Notable for the following components:      Result Value   Glucose, Bld 123 (*)    Total Bilirubin 1.6 (*)    All other components within normal limits  CBC WITH DIFFERENTIAL/PLATELET - Abnormal; Notable for the following components:   WBC 11.6 (*)    Hemoglobin 15.2 (*)    Neutro Abs 9.4 (*)    All other components within normal limits  URINALYSIS, ROUTINE W REFLEX MICROSCOPIC - Abnormal; Notable for the following components:   Color, Urine YELLOW (*)    Nitrite POSITIVE (*)    Leukocytes,Ua TRACE (*)    Bacteria, UA RARE (*)    All other components  within normal limits  URINE CULTURE    EKG None  Radiology Ct Head Wo Contrast  Result Date: 03/02/2019 CLINICAL DATA:  Altered level of consciousness EXAM: CT HEAD WITHOUT CONTRAST TECHNIQUE: Contiguous axial images were obtained from the base of the skull through the vertex without intravenous contrast. COMPARISON:  02/07/2019 FINDINGS: Brain: Chronic atrophic changes and chronic white matter ischemic changes are again noted and stable. No findings to suggest acute hemorrhage, acute infarction or space-occupying mass lesion are seen. Vascular: No hyperdense vessel or unexpected calcification. Skull: Normal. Negative for fracture or focal lesion. Sinuses/Orbits: No acute finding. Other: None. IMPRESSION: Chronic atrophic and ischemic changes stable from the prior exam. No acute abnormality noted. Electronically Signed   By: Inez Catalina M.D.   On: 03/02/2019 12:24   Dg Hip Unilat With Pelvis 2-3 Views Right  Result Date: 03/02/2019 CLINICAL DATA:  Fall. EXAM: DG HIP (WITH OR WITHOUT PELVIS) 2-3V RIGHT COMPARISON:  CT abdomen pelvis dated February 27, 2019. FINDINGS: No acute fracture or dislocation. Unchanged mild right hip joint space narrowing and chondrocalcinosis. Similar appearing degenerative changes of the pubic symphysis. Osteopenia. Soft tissues are unremarkable. IMPRESSION: 1.  No acute osseous abnormality. Electronically Signed   By: Titus Dubin M.D.   On: 03/02/2019 12:21    Procedures Procedures (including critical care time)  Medications Ordered in ED Medications  acetaminophen (TYLENOL) tablet 1,000 mg (1,000 mg Oral Given 03/02/19 1528)     Initial Impression / Assessment and Plan / ED Course  I have reviewed the triage vital signs and the nursing notes.  Pertinent labs & imaging results that were available during my care of the patient were reviewed by me and considered in my medical decision making (see chart for details).        Patient be treated for urinary  tract infection based on her urinalysis and symptoms.  The patient has had these in the past and  they cause similar symptoms.  Have advised the daughter of the plan and all questions were answered.  I did have a 20-minute phone conversation with the daughter who had a lot of concerns.  I have advised the family to return here as needed.  Told to follow-up with her primary doctor soon as possible.  Final Clinical Impressions(s) / ED Diagnoses   Final diagnoses:  None    ED Discharge Orders    None       Dalia Heading, PA-C 03/02/19 [place], Wenda Overland, MD 03/06/19 [phone]

## 2019-03-04 ENCOUNTER — Other Ambulatory Visit: Payer: Self-pay

## 2019-03-04 ENCOUNTER — Emergency Department (HOSPITAL_COMMUNITY): Payer: Medicare Other

## 2019-03-04 ENCOUNTER — Encounter (HOSPITAL_COMMUNITY): Payer: Self-pay | Admitting: Emergency Medicine

## 2019-03-04 ENCOUNTER — Inpatient Hospital Stay (HOSPITAL_COMMUNITY)
Admission: EM | Admit: 2019-03-04 | Discharge: 2019-03-12 | DRG: 871 | Disposition: A | Discharge status: Hospice | Payer: Medicare Other | Attending: Internal Medicine | Admitting: Internal Medicine

## 2019-03-04 DIAGNOSIS — Z90710 Acquired absence of both cervix and uterus: Secondary | ICD-10-CM

## 2019-03-04 DIAGNOSIS — I951 Orthostatic hypotension: Secondary | ICD-10-CM | POA: Diagnosis present

## 2019-03-04 DIAGNOSIS — R7881 Bacteremia: Secondary | ICD-10-CM | POA: Diagnosis not present

## 2019-03-04 DIAGNOSIS — Z515 Encounter for palliative care: Secondary | ICD-10-CM | POA: Diagnosis present

## 2019-03-04 DIAGNOSIS — N390 Urinary tract infection, site not specified: Secondary | ICD-10-CM | POA: Diagnosis present

## 2019-03-04 DIAGNOSIS — I2510 Atherosclerotic heart disease of native coronary artery without angina pectoris: Secondary | ICD-10-CM | POA: Diagnosis present

## 2019-03-04 DIAGNOSIS — K6389 Other specified diseases of intestine: Secondary | ICD-10-CM | POA: Diagnosis not present

## 2019-03-04 DIAGNOSIS — R509 Fever, unspecified: Secondary | ICD-10-CM | POA: Diagnosis not present

## 2019-03-04 DIAGNOSIS — A4151 Sepsis due to Escherichia coli [E. coli]: Principal | ICD-10-CM | POA: Diagnosis present

## 2019-03-04 DIAGNOSIS — Z8589 Personal history of malignant neoplasm of other organs and systems: Secondary | ICD-10-CM

## 2019-03-04 DIAGNOSIS — K5669 Other partial intestinal obstruction: Secondary | ICD-10-CM | POA: Diagnosis present

## 2019-03-04 DIAGNOSIS — E785 Hyperlipidemia, unspecified: Secondary | ICD-10-CM | POA: Diagnosis present

## 2019-03-04 DIAGNOSIS — A419 Sepsis, unspecified organism: Secondary | ICD-10-CM

## 2019-03-04 DIAGNOSIS — Z1611 Resistance to penicillins: Secondary | ICD-10-CM | POA: Diagnosis present

## 2019-03-04 DIAGNOSIS — Z809 Family history of malignant neoplasm, unspecified: Secondary | ICD-10-CM

## 2019-03-04 DIAGNOSIS — F329 Major depressive disorder, single episode, unspecified: Secondary | ICD-10-CM | POA: Diagnosis present

## 2019-03-04 DIAGNOSIS — Z20828 Contact with and (suspected) exposure to other viral communicable diseases: Secondary | ICD-10-CM | POA: Diagnosis present

## 2019-03-04 DIAGNOSIS — Z741 Need for assistance with personal care: Secondary | ICD-10-CM | POA: Diagnosis not present

## 2019-03-04 DIAGNOSIS — K56609 Unspecified intestinal obstruction, unspecified as to partial versus complete obstruction: Secondary | ICD-10-CM | POA: Diagnosis not present

## 2019-03-04 DIAGNOSIS — G309 Alzheimer's disease, unspecified: Secondary | ICD-10-CM | POA: Diagnosis present

## 2019-03-04 DIAGNOSIS — G9341 Metabolic encephalopathy: Secondary | ICD-10-CM | POA: Diagnosis present

## 2019-03-04 DIAGNOSIS — C49A0 Gastrointestinal stromal tumor, unspecified site: Secondary | ICD-10-CM | POA: Diagnosis present

## 2019-03-04 DIAGNOSIS — R0689 Other abnormalities of breathing: Secondary | ICD-10-CM | POA: Diagnosis not present

## 2019-03-04 DIAGNOSIS — Z79899 Other long term (current) drug therapy: Secondary | ICD-10-CM

## 2019-03-04 DIAGNOSIS — G43909 Migraine, unspecified, not intractable, without status migrainosus: Secondary | ICD-10-CM | POA: Diagnosis present

## 2019-03-04 DIAGNOSIS — I482 Chronic atrial fibrillation, unspecified: Secondary | ICD-10-CM | POA: Diagnosis present

## 2019-03-04 DIAGNOSIS — Z8249 Family history of ischemic heart disease and other diseases of the circulatory system: Secondary | ICD-10-CM

## 2019-03-04 DIAGNOSIS — Z6831 Body mass index (BMI) 31.0-31.9, adult: Secondary | ICD-10-CM | POA: Diagnosis not present

## 2019-03-04 DIAGNOSIS — R279 Unspecified lack of coordination: Secondary | ICD-10-CM | POA: Diagnosis not present

## 2019-03-04 DIAGNOSIS — Z7189 Other specified counseling: Secondary | ICD-10-CM | POA: Diagnosis not present

## 2019-03-04 DIAGNOSIS — R1111 Vomiting without nausea: Secondary | ICD-10-CM | POA: Diagnosis not present

## 2019-03-04 DIAGNOSIS — I4891 Unspecified atrial fibrillation: Secondary | ICD-10-CM | POA: Diagnosis present

## 2019-03-04 DIAGNOSIS — K449 Diaphragmatic hernia without obstruction or gangrene: Secondary | ICD-10-CM | POA: Diagnosis not present

## 2019-03-04 DIAGNOSIS — R64 Cachexia: Secondary | ICD-10-CM | POA: Diagnosis present

## 2019-03-04 DIAGNOSIS — J9811 Atelectasis: Secondary | ICD-10-CM | POA: Diagnosis present

## 2019-03-04 DIAGNOSIS — R1084 Generalized abdominal pain: Secondary | ICD-10-CM | POA: Diagnosis not present

## 2019-03-04 DIAGNOSIS — R531 Weakness: Secondary | ICD-10-CM | POA: Diagnosis not present

## 2019-03-04 DIAGNOSIS — R4182 Altered mental status, unspecified: Secondary | ICD-10-CM

## 2019-03-04 DIAGNOSIS — K631 Perforation of intestine (nontraumatic): Secondary | ICD-10-CM | POA: Diagnosis present

## 2019-03-04 DIAGNOSIS — Z66 Do not resuscitate: Secondary | ICD-10-CM | POA: Diagnosis present

## 2019-03-04 DIAGNOSIS — B9620 Unspecified Escherichia coli [E. coli] as the cause of diseases classified elsewhere: Secondary | ICD-10-CM | POA: Diagnosis not present

## 2019-03-04 DIAGNOSIS — I110 Hypertensive heart disease with heart failure: Secondary | ICD-10-CM | POA: Diagnosis present

## 2019-03-04 DIAGNOSIS — Z8673 Personal history of transient ischemic attack (TIA), and cerebral infarction without residual deficits: Secondary | ICD-10-CM

## 2019-03-04 DIAGNOSIS — R4020 Unspecified coma: Secondary | ICD-10-CM | POA: Diagnosis not present

## 2019-03-04 DIAGNOSIS — F0280 Dementia in other diseases classified elsewhere without behavioral disturbance: Secondary | ICD-10-CM | POA: Diagnosis present

## 2019-03-04 DIAGNOSIS — Z7982 Long term (current) use of aspirin: Secondary | ICD-10-CM

## 2019-03-04 DIAGNOSIS — L089 Local infection of the skin and subcutaneous tissue, unspecified: Secondary | ICD-10-CM | POA: Diagnosis not present

## 2019-03-04 DIAGNOSIS — E876 Hypokalemia: Secondary | ICD-10-CM | POA: Diagnosis present

## 2019-03-04 DIAGNOSIS — R1900 Intra-abdominal and pelvic swelling, mass and lump, unspecified site: Secondary | ICD-10-CM | POA: Diagnosis present

## 2019-03-04 DIAGNOSIS — Z8261 Family history of arthritis: Secondary | ICD-10-CM

## 2019-03-04 DIAGNOSIS — R404 Transient alteration of awareness: Secondary | ICD-10-CM | POA: Diagnosis not present

## 2019-03-04 DIAGNOSIS — K659 Peritonitis, unspecified: Secondary | ICD-10-CM

## 2019-03-04 DIAGNOSIS — I5032 Chronic diastolic (congestive) heart failure: Secondary | ICD-10-CM | POA: Diagnosis present

## 2019-03-04 DIAGNOSIS — R0902 Hypoxemia: Secondary | ICD-10-CM | POA: Diagnosis not present

## 2019-03-04 DIAGNOSIS — R52 Pain, unspecified: Secondary | ICD-10-CM | POA: Diagnosis not present

## 2019-03-04 DIAGNOSIS — Z85068 Personal history of other malignant neoplasm of small intestine: Secondary | ICD-10-CM

## 2019-03-04 DIAGNOSIS — Z818 Family history of other mental and behavioral disorders: Secondary | ICD-10-CM

## 2019-03-04 DIAGNOSIS — C49A3 Gastrointestinal stromal tumor of small intestine: Secondary | ICD-10-CM | POA: Diagnosis not present

## 2019-03-04 DIAGNOSIS — G9340 Encephalopathy, unspecified: Secondary | ICD-10-CM | POA: Diagnosis present

## 2019-03-04 DIAGNOSIS — Z915 Personal history of self-harm: Secondary | ICD-10-CM

## 2019-03-04 DIAGNOSIS — Z743 Need for continuous supervision: Secondary | ICD-10-CM | POA: Diagnosis not present

## 2019-03-04 DIAGNOSIS — I509 Heart failure, unspecified: Secondary | ICD-10-CM | POA: Diagnosis not present

## 2019-03-04 DIAGNOSIS — R918 Other nonspecific abnormal finding of lung field: Secondary | ICD-10-CM | POA: Diagnosis not present

## 2019-03-04 LAB — CBC WITH DIFFERENTIAL/PLATELET
Abs Immature Granulocytes: 0.02 10*3/uL (ref 0.00–0.07)
Basophils Absolute: 0 10*3/uL (ref 0.0–0.1)
Basophils Relative: 0 %
Eosinophils Absolute: 0 10*3/uL (ref 0.0–0.5)
Eosinophils Relative: 0 %
HCT: 42.3 % (ref 36.0–46.0)
Hemoglobin: 14.9 g/dL (ref 12.0–15.0)
Immature Granulocytes: 0 %
Lymphocytes Relative: 6 %
Lymphs Abs: 0.3 10*3/uL — ABNORMAL LOW (ref 0.7–4.0)
MCH: 31.4 pg (ref 26.0–34.0)
MCHC: 35.2 g/dL (ref 30.0–36.0)
MCV: 89.1 fL (ref 80.0–100.0)
Monocytes Absolute: 0.3 10*3/uL (ref 0.1–1.0)
Monocytes Relative: 6 %
Neutro Abs: 5 10*3/uL (ref 1.7–7.7)
Neutrophils Relative %: 88 %
Platelets: 130 10*3/uL — ABNORMAL LOW (ref 150–400)
RBC: 4.75 MIL/uL (ref 3.87–5.11)
RDW: 12.5 % (ref 11.5–15.5)
WBC: 5.7 10*3/uL (ref 4.0–10.5)
nRBC: 0 % (ref 0.0–0.2)

## 2019-03-04 LAB — URINALYSIS, ROUTINE W REFLEX MICROSCOPIC
Bacteria, UA: NONE SEEN
Bilirubin Urine: NEGATIVE
Glucose, UA: NEGATIVE mg/dL
Hgb urine dipstick: NEGATIVE
Ketones, ur: NEGATIVE mg/dL
Leukocytes,Ua: NEGATIVE
Nitrite: NEGATIVE
Protein, ur: 30 mg/dL — AB
Specific Gravity, Urine: 1.012 (ref 1.005–1.030)
pH: 5 (ref 5.0–8.0)

## 2019-03-04 LAB — COMPREHENSIVE METABOLIC PANEL
ALT: 34 U/L (ref 0–44)
AST: 29 U/L (ref 15–41)
Albumin: 3 g/dL — ABNORMAL LOW (ref 3.5–5.0)
Alkaline Phosphatase: 75 U/L (ref 38–126)
Anion gap: 11 (ref 5–15)
BUN: 15 mg/dL (ref 8–23)
CO2: 22 mmol/L (ref 22–32)
Calcium: 8.8 mg/dL — ABNORMAL LOW (ref 8.9–10.3)
Chloride: 95 mmol/L — ABNORMAL LOW (ref 98–111)
Creatinine, Ser: 0.93 mg/dL (ref 0.44–1.00)
GFR calc Af Amer: >60 mL/min (ref >60)
GFR calc non Af Amer: 55 mL/min — ABNORMAL LOW (ref >60)
Glucose, Bld: 148 mg/dL — ABNORMAL HIGH (ref 70–99)
Potassium: 3.8 mmol/L (ref 3.5–5.1)
Sodium: 128 mmol/L — ABNORMAL LOW (ref 135–145)
Total Bilirubin: 1.6 mg/dL — ABNORMAL HIGH (ref 0.3–1.2)
Total Protein: 5.7 g/dL — ABNORMAL LOW (ref 6.5–8.1)

## 2019-03-04 LAB — PROTIME-INR
INR: 1.1 (ref 0.8–1.2)
Prothrombin Time: 14.1 seconds (ref 11.4–15.2)

## 2019-03-04 LAB — LACTIC ACID, PLASMA
Lactic Acid, Venous: 2.5 mmol/L (ref 0.5–1.9)
Lactic Acid, Venous: 2.4 mmol/L (ref 0.5–1.9)

## 2019-03-04 LAB — APTT: aPTT: 29 seconds (ref 24–36)

## 2019-03-04 LAB — SARS CORONAVIRUS 2 BY RT PCR (HOSPITAL ORDER, PERFORMED IN ~~LOC~~ HOSPITAL LAB): SARS Coronavirus 2: NEGATIVE

## 2019-03-04 MED ORDER — METRONIDAZOLE IN NACL 5-0.79 MG/ML-% IV SOLN
500.0000 mg | Freq: Once | INTRAVENOUS | Status: AC
  Administered 2019-03-04: 16:00:00 500 mg via INTRAVENOUS
  Filled 2019-03-04: qty 100

## 2019-03-04 MED ORDER — MORPHINE 100MG IN NS 100ML (1MG/ML) PREMIX INFUSION: 1.0000 mg/h | INTRAVENOUS | Status: DC

## 2019-03-04 MED ORDER — SODIUM CHLORIDE 0.9 % IV SOLN
2.0000 g | Freq: Two times a day (BID) | INTRAVENOUS | Status: DC
  Administered 2019-03-05 – 2019-03-09 (×9): 2 g via INTRAVENOUS
  Filled 2019-03-04 (×11): qty 2

## 2019-03-04 MED ORDER — VANCOMYCIN HCL IN DEXTROSE 1-5 GM/200ML-% IV SOLN
1000.0000 mg | Freq: Once | INTRAVENOUS | Status: AC
  Administered 2019-03-04: 1000 mg via INTRAVENOUS
  Filled 2019-03-04: qty 200

## 2019-03-04 MED ORDER — VANCOMYCIN HCL IN DEXTROSE 750-5 MG/150ML-% IV SOLN
750.0000 mg | Freq: Once | INTRAVENOUS | Status: AC
  Administered 2019-03-04: 19:00:00 750 mg via INTRAVENOUS
  Filled 2019-03-04: qty 150

## 2019-03-04 MED ORDER — SODIUM CHLORIDE 0.9 % IV SOLN: 2.0000 g | Freq: Once | INTRAVENOUS | Status: DC

## 2019-03-04 MED ORDER — VANCOMYCIN HCL 10 G IV SOLR: 1750.0000 mg | INTRAVENOUS | Status: DC

## 2019-03-04 MED ORDER — FENTANYL CITRATE (PF) 100 MCG/2ML IJ SOLN
50.0000 ug | Freq: Once | INTRAMUSCULAR | Status: AC
  Administered 2019-03-04: 50 ug via INTRAVENOUS
  Filled 2019-03-04: qty 2

## 2019-03-04 MED ORDER — SODIUM CHLORIDE 0.9 % IV BOLUS: 1000.0000 mL | Freq: Once | INTRAVENOUS | Status: DC

## 2019-03-04 MED ORDER — SODIUM CHLORIDE 0.9 % IV BOLUS
500.0000 mL | Freq: Once | INTRAVENOUS | Status: AC
  Administered 2019-03-04: 20:00:00 500 mL via INTRAVENOUS

## 2019-03-04 MED ORDER — SODIUM CHLORIDE 0.9 % IV SOLN
2.0000 g | Freq: Once | INTRAVENOUS | Status: AC
  Administered 2019-03-04: 17:00:00 2 g via INTRAVENOUS
  Filled 2019-03-04: qty 2

## 2019-03-04 NOTE — Progress Notes (Signed)
Pharmacy Antibiotic Note  Valerie Chen is a 85 y.o. female admitted on 03/04/2019 with sepsis.  Pharmacy has been consulted for vancomycin and cefepime dosing.  Has listed ceftriaxone allergy - upon chart review, has tolerated cefepime in the past. Presenting with AMS. WBC 5.7, LA 2.5, afebrile. Scr 0.93 (CrCl 40 mL/min).  Plan: Order cefepime 2 g IV every 8 hours Order vancomycin 1750 mg IV every 48 hours (estAUC 484) Monitor renal fx, cx results, clinical pic, and level as appropriate  Height: 5\' 3"  (160 cm) Weight: 167 lb (75.8 kg) IBW/kg (Calculated) : 52.4  Temp (24hrs), Avg:97.7 F (36.5 C), Min:97.7 F (36.5 C), Max:97.7 F (36.5 C)  Recent Labs  Lab 03/02/19 1245 03/04/19 1600 03/04/19 1602  WBC 11.6*  --  5.7  CREATININE 0.77  --   --   LATICACIDVEN  --  2.5*  --     Estimated Creatinine Clearance: 47.4 mL/min (by C-G formula based on SCr of 0.77 mg/dL).    Allergies  Allergen Reactions  . Levaquin [Levofloxacin] Other (See Comments)    Altered mental status   . Rocephin [Ceftriaxone] Other (See Comments)    Uncontrollable chills/shaking, dizziness, and disorientation after receiving this (??)  . Statins Other (See Comments)    Severe myalgias    Antimicrobials this admission: Vancomycin 9/13 >>  Cefepime 9/13 >>  Metronidazole 9/13>>  Dose adjustments this admission: N/A  Microbiology results: 9/13 BCx: sent 9/13 UCx: sent  9/13 COVID: sent  Thank you for allowing pharmacy to be a part of this patient's care.  Antonietta Jewel, PharmD, BCCCP Clinical Pharmacist  Phone: [phone]  Please check AMION for all [place] phone numbers After 10:00 PM, call [place] [phone] 03/04/2019 4:43 PM

## 2019-03-04 NOTE — ED Provider Notes (Signed)
[facility] EMERGENCY DEPARTMENT Provider Note   CSN: [id number] Arrival date & time: 03/04/19  1518     History   Chief Complaint Chief Complaint  Patient presents with  . Altered Mental Status    HPI Valerie Chen is a 85 y.o. female.     Patient is a 85 year old female with a history of dementia, coronary artery disease, abdominal cancer, TIA who presents with altered mental status.  She was seen on Friday with generalized weakness.  She was diagnosed with a urinary tract infection and started on antibiotics at that time.  Her caregiver says she is gotten worse over the weekend.  She is not ambulatory like she normally is.  She does not feed herself like she normally does.  She is less responsive than she normally does.  They noted a fever today of 101.  She has had a cough today with one episode of projectile vomiting.  She is complaining of some increased abdominal pain.  She has had no diarrhea.  They have also noticed that she has weakness on her right hand (since Friday, 2 days ago).  She normally will eat out of her right hand and she has been dropping the fork out of her hand today.  She seems to be using her left hand normally.  She lives at home with her daughter with 24-hour caretakers.     Past Medical History:  Diagnosis Date  . Alzheimer disease ([place])   . Arthritis   . CAD (coronary artery disease)    Probable CAD, nuclear February, 2012, possible anteroseptal and inferoseptal ischemia but the study was technically difficult  . Cancer ([place])   . Chest pain    Hospital February, 2012, nuclear, possible anteroseptal and inferoseptal ischemia, technically difficult, medical therapy  . Dementia ([place])    Significant  . Diastolic dysfunction    Echo, February, 2012  . Diastolic dysfunction   . DNR (do not resuscitate)   . Dyslipidemia   . Edema    hospitalizations September, 2013, some fluid overload  . Ejection fraction    EF 55%, echo,  February, 2012  //   EF 55-60%, echo, February 25, 2012 // Echo 12/17: EF 60-65, no RWMA, Gr 1 DD, normal RVSF  . Essential hypertension   . GI bleed    February, 2012 incomplete colonoscopy, hemorrhoids, needs complete colonoscopy  . 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 Hx: UTI (urinary tract infection)   . Hyperlipidemia   . Leg pain, bilateral   . Major depression    2 overdoses in 1975 & 1977  . Migraines    25 year  . Orthostatic hypotension   . Suicide attempt ([place]) 1985   2 attempts with Valium  . TIA (transient ischemic attack)       ???question of a TIA in February, 2012, carotid Doppler, August 11, 2010 no significant abnormalities    Patient Active Problem List   Diagnosis Date Noted  . Syncope 06/09/2018  . New onset atrial fibrillation ([place]) 06/09/2018  . URI with cough and congestion 06/09/2018  . 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 Breast mass, right 11/29/2017  . Skin lesion of back 11/29/2017  . Difficulty walking 02/23/2017  . Dementia, previously on Seroquel, able to wean with family and caregiver support 01/21/2017  . Abdominal mass, monitored by [facility] 01/21/2017  . NSVT (nonsustained ventricular tachycardia) ([place]) 05/11/2016  . Recurrent UTI,  on daily Trimethroprim 02/01/2015  . Essential hypertension, on no medications 05/30/2013  . Coronary artery disease   . Diastolic dysfunction   . TIA (transient ischemic attack), questionable, 07/2010, with negative carotid dopplers   . History of GI bleed, with hemorrhoids, last colonoscopy 2012 at time of bleed   . Dyslipidemia, no statin   . Arthralgia of multiple joints 05/22/2009  . Vitamin D deficiency 01/27/2009    Past Surgical History:  Procedure Laterality Date  . ABDOMINAL HYSTERECTOMY    . TONSILLECTOMY       OB History   No obstetric history on file.      Home Medications    Prior to Admission  medications   Medication Sig Start Date End Date Taking? Authorizing Provider  acetaminophen (TYLENOL) 500 MG tablet Take 1,000 mg by mouth every 6 (six) hours as needed for moderate pain.    [provider]  Ascorbic Acid (VITAMIN C) 1000 MG tablet Take 1,000 mg by mouth daily.    [provider]  aspirin EC 81 MG tablet Take 81 mg by mouth daily.    [provider]  Cholecalciferol (VITAMIN D-3) 1000 units CAPS Take 2,000 Units by mouth daily.     [provider]  cyanocobalamin 500 MCG tablet Take 500 mcg by mouth daily.     [provider]  MELATONIN ER PO Take 2 mg by mouth daily at 6 PM.    [provider]  Multiple Vitamin (MULTIVITAMIN WITH MINERALS) TABS tablet Take 1 tablet by mouth daily.    [provider]  nitrofurantoin, macrocrystal-monohydrate, (MACROBID) 100 MG capsule Take 1 capsule (100 mg total) by mouth 2 (two) times daily. 03/02/19   Lawyer, Harrell Gave, PA-C  Omega-3 Fatty Acids (OMEGA-3 FISH OIL) 300 MG CAPS Take 2 capsules by mouth daily.     [provider]  OVER THE COUNTER MEDICATION Take 1 capsule by mouth 2 (two) times daily with a meal. "Fem Dophilus" for urinary tract health    [provider]  OVER THE COUNTER MEDICATION Take 5 mLs by mouth daily. D-Mannose powder - for urinary tract health - mix in 6 oz water and drink    [provider]    Family History Family History  Problem Relation Age of Onset  . Heart attack Father   . Cancer Father   . Arthritis Father   . Suicidality Mother   . Depression Mother   . Heart attack Brother   . Heart disease Brother   . Pneumonia Brother   . Suicidality Son   . Hypertension Daughter   . Depression Other     Social History Social History   Tobacco Use  . Smoking status: Never Smoker  . Smokeless tobacco: Never Used  Substance Use Topics  . Alcohol use: No  . Drug use: Yes    Comment: hx of valium in 1980's - had 2  overdoses per daughter     Allergies   Levaquin [levofloxacin], Rocephin [ceftriaxone], and Statins   Review of Systems Review of Systems  Unable to perform ROS: Dementia     Physical Exam Updated Vital Signs BP 108/71   Pulse (!) 139   Temp 97.7 F (36.5 C) (Oral)   Resp 18   Ht 5\' 3"  (1.6 m)   Wt 75.8 kg   SpO2 92%   BMI 29.58 kg/m   Physical Exam Constitutional:      Appearance: She is well-developed.     Comments:  Sleepy but will occasionally open her eyes  HENT:     Head: Normocephalic and atraumatic.  Eyes:     Pupils: Pupils are equal, round, and reactive to light.  Neck:     Musculoskeletal: Normal range of motion and neck supple.  Cardiovascular:     Rate and Rhythm: Normal rate and regular rhythm.     Heart sounds: Normal heart sounds.  Pulmonary:     Effort: Pulmonary effort is normal. No respiratory distress.     Breath sounds: Normal breath sounds. No wheezing or rales.  Chest:     Chest wall: No tenderness.  Abdominal:     General: Bowel sounds are normal.     Palpations: Abdomen is soft.     Tenderness: There is abdominal tenderness (Patient grimaces anywhere I touch her abdomen). There is no guarding or rebound.  Musculoskeletal: Normal range of motion.  Lymphadenopathy:     Cervical: No cervical adenopathy.  Skin:    General: Skin is warm and dry.     Findings: No rash.  Neurological:     Comments: Patient is not following commands.  I cannot really elicit whether she has any weakness because she will not grip my hands or hold her arms up on either side.  She she does not withdraw from either leg with Babinski testing.      ED Treatments / Results  Labs (all labs ordered are listed, but only abnormal results are displayed) Labs Reviewed  LACTIC ACID, PLASMA - Abnormal; Notable for the following components:      Result Value   Lactic Acid, Venous 2.5 (*)    All other components within normal limits  LACTIC ACID, PLASMA - Abnormal;  Notable for the following components:   Lactic Acid, Venous 2.4 (*)    All other components within normal limits  COMPREHENSIVE METABOLIC PANEL - Abnormal; Notable for the following components:   Sodium 128 (*)    Chloride 95 (*)    Glucose, Bld 148 (*)    Calcium 8.8 (*)    Total Protein 5.7 (*)    Albumin 3.0 (*)    Total Bilirubin 1.6 (*)    GFR calc non Af Amer 55 (*)    All other components within normal limits  CBC WITH DIFFERENTIAL/PLATELET - Abnormal; Notable for the following components:   Platelets 130 (*)    Lymphs Abs 0.3 (*)    All other components within normal limits  URINALYSIS, ROUTINE W REFLEX MICROSCOPIC - Abnormal; Notable for the following components:   Color, Urine AMBER (*)    APPearance HAZY (*)    Protein, ur 30 (*)    All other components within normal limits  SARS CORONAVIRUS 2 (HOSPITAL ORDER, [place] [facility])  CULTURE, BLOOD (ROUTINE X 2)  CULTURE, BLOOD (ROUTINE X 2)  URINE CULTURE  APTT  PROTIME-INR    EKG EKG Interpretation  Date/Time:  Sunday March 04 2019 15:33:11 EDT Ventricular Rate:  90 PR Interval:    QRS Duration: 94 QT Interval:  359 QTC Calculation: 440 R Axis:   98 Text Interpretation:  Atrial fibrillation Right axis deviation SINCE LAST TRACING HEART RATE HAS INCREASED Confirmed by Malvin Johns [phone]) on 03/04/2019 4:57:14 PM   Radiology Ct Abdomen Pelvis Wo Contrast  Result Date: 03/04/2019 CLINICAL DATA:  85 year old female with abdominal and pelvic pain with fever. Known mesenteric mass. EXAM: CT ABDOMEN AND PELVIS WITHOUT CONTRAST TECHNIQUE: Multidetector CT imaging of the abdomen and pelvis  was performed following the standard protocol without IV contrast. COMPARISON:  02/27/2019 CT and prior studies FINDINGS: Please note that parenchymal abnormalities may be missed without intravenous contrast. Lower chest: Trace bilateral pleural effusions and bibasilar opacities/atelectasis noted. Cardiomegaly  is present. Hepatobiliary: The liver and gallbladder are unremarkable. No biliary dilatation. Pancreas: Unremarkable Spleen: Unremarkable Adrenals/Urinary Tract: The kidneys, adrenal glands and bladder are unremarkable. Stomach/Bowel: The previously identified abdominal mass measures 6.5 x 10 cm and now contains gas. Moderate inflammation and foci of pneumoperitoneum are noted adjacent to the mass. There are mildly distended loops of small bowel within the LEFT abdomen. No other bowel abnormalities are identified. Vascular/Lymphatic: Aortic atherosclerosis. No enlarged abdominal or pelvic lymph nodes. Reproductive: Status post hysterectomy. No adnexal masses. Other: No ascites or focal collection. Musculoskeletal: No acute or suspicious bony abnormalities identified. IMPRESSION: 1. Large abdominal mass again noted, but now containing gas with moderate adjacent inflammation and foci of free air. This may represent necrosis of this mass with gas formation, superimposed infection or involvement of bowel with perforation. No evidence of abscess. 2. Dilated loops of small bowel within the LEFT abdomen which may represent a focal ileus or developing small bowel obstruction. 3. Trace bilateral pleural effusions with bibasilar opacities/atelectasis. 4.  Aortic Atherosclerosis (ICD10-I70.0). Electronically Signed   By: Margarette Canada M.D.   On: 03/04/2019 17:35   Ct Head Wo Contrast  Result Date: 03/04/2019 CLINICAL DATA:  85 year old female with altered level of consciousness. EXAM: CT HEAD WITHOUT CONTRAST TECHNIQUE: Contiguous axial images were obtained from the base of the skull through the vertex without intravenous contrast. COMPARISON:  03/02/2019 CT and prior studies FINDINGS: Brain: No evidence of acute infarction, hemorrhage, hydrocephalus, extra-axial collection or mass lesion/mass effect. Atrophy and chronic small-vessel white matter ischemic changes again noted. Vascular: Atherosclerotic calcifications noted.  Skull: Normal. Negative for fracture or focal lesion. Sinuses/Orbits: No acute finding. Other: None. IMPRESSION: 1. No evidence of acute intracranial abnormality. 2. Atrophy and chronic small-vessel white matter ischemic changes. Electronically Signed   By: Margarette Canada M.D.   On: 03/04/2019 17:20   Dg Chest Port 1 View  Result Date: 03/04/2019 CLINICAL DATA:  Patient with weakness. EXAM: PORTABLE CHEST 1 VIEW COMPARISON:  Chest radiograph 02/07/2019 FINDINGS: Monitoring leads overlie the patient. Stable cardiomegaly. Low lung volumes. Minimal bibasilar heterogeneous opacities. No pleural effusion or pneumothorax. Thoracic spine degenerative changes. IMPRESSION: Low lung volumes with bibasilar opacities favored to represent atelectasis. Infection not excluded Electronically Signed   By: Lovey Newcomer M.D.   On: 03/04/2019 16:27    Procedures Procedures (including critical care time)  Medications Ordered in ED Medications  vancomycin (VANCOCIN) 1,750 mg in sodium chloride 0.9 % 500 mL IVPB (has no administration in time range)  ceFEPIme (MAXIPIME) 2 g in sodium chloride 0.9 % 100 mL IVPB (has no administration in time range)  metroNIDAZOLE (FLAGYL) IVPB 500 mg (0 mg Intravenous Stopped 03/04/19 1721)  vancomycin (VANCOCIN) IVPB 1000 mg/200 mL premix (0 mg Intravenous Stopped 03/04/19 1722)  ceFEPIme (MAXIPIME) 2 g in sodium chloride 0.9 % 100 mL IVPB (0 g Intravenous Stopped 03/04/19 1759)  vancomycin (VANCOCIN) IVPB 750 mg/150 ml premix (0 mg Intravenous Stopped 03/04/19 1953)  sodium chloride 0.9 % bolus 500 mL (500 mLs Intravenous New Bag/Given 03/04/19 2016)     Initial Impression / Assessment and Plan / ED Course  I have reviewed the triage vital signs and the nursing notes.  Pertinent labs & imaging results that were available during my care  of the patient were reviewed by me and considered in my medical decision making (see chart for details).        Patient is a 85 year old female who  presents with altered mental status and reported fever at home of 101.  Her caregiver says that she has had a decline since Friday.  She was treated on arrival for sepsis given the reported fever and decline in mental status.  She had an elevated lactate but her white count was normal.  Her urine shows no signs of infection.  Chest x-ray is clear without suggestions of pneumonia.  Her Linus Orn test is negative.  She did have some significant abdominal pain on exam and a CT scan was ordered which shows a possible necrotic area of the previously known abdominal mass versus bowel perforation versus infection.  There is also a possible partial small bowel obstruction.  I spoke with the patient's daughter, Valerie Chen, who advises that the family has talked and is moving toward hospice care.  They would like a hospice consult while she is in the hospital.  They do not want to pursue any surgical options for the patient and they are aware that this may be a fatal event for the patient.  They would like to continue with antibiotics and pain management but do not want to pursue any aggressive measures such as surgery.  She does have DNR papers at bedside.  She was started on broad-spectrum antibiotics and IV fluids for potential sepsis.  I spoke with Dr. Hal Hope who will admit the patient for further treatment.  CRITICAL CARE Performed by: Malvin Johns Total critical care time: 60 minutes Critical care time was exclusive of separately billable procedures and treating other patients. Critical care was necessary to treat or prevent imminent or life-threatening deterioration. Critical care was time spent personally by me on the following activities: development of treatment plan with patient and/or surrogate as well as nursing, discussions with consultants, evaluation of patient's response to treatment, examination of patient, obtaining history from patient or surrogate, ordering and performing treatments and  interventions, ordering and review of laboratory studies, ordering and review of radiographic studies, pulse oximetry and re-evaluation of patient's condition.   Final Clinical Impressions(s) / ED Diagnoses   Final diagnoses:  Altered mental status, unspecified altered mental status type  Abdominal infection ([place])  Sepsis, due to unspecified organism, unspecified whether acute organ dysfunction present [facility])    ED Discharge Orders    None       Malvin Johns, MD 03/04/19 2033

## 2019-03-04 NOTE — ED Triage Notes (Signed)
Pt from home BIB EMS and has been less oriented and active since Friday after a fall. Home health aide reports decreased mentation, which is not baseline. Pt does have hx of dementia but is normally alert and oriented and ambulatory. Aide reports having to feed patient today, which is usual. Upon arrival pt is very lethargic and difficult to arouse.

## 2019-03-04 NOTE — ED Notes (Signed)
ED TO INPATIENT HANDOFF REPORT  ED Nurse Name and Phone #: Caprice Kluver [phone]  S Name/Age/Gender Valerie Chen 85 y.o. female Room/Bed: 039C/039C  Code Status   Code Status: DNR  Home/SNF/Other Home Patient oriented to: self Is this baseline? No   Triage Complete: Triage complete  Chief Complaint declining health  Triage Note Pt from home BIB EMS and has been less oriented and active since Friday after a fall. Home health aide reports decreased mentation, which is not baseline. Pt does have hx of dementia but is normally alert and oriented and ambulatory. Aide reports having to feed patient today, which is usual. Upon arrival pt is very lethargic and difficult to arouse.    Allergies Allergies  Allergen Reactions  . Levaquin [Levofloxacin] Other (See Comments)    Altered mental status   . Rocephin [Ceftriaxone] Other (See Comments)    Uncontrollable chills/shaking, dizziness, and disorientation after receiving this (??)  . Statins Other (See Comments)    Severe myalgias    Level of Care/Admitting Diagnosis ED Disposition    ED Disposition Condition [facility]: [place] [[id number]]  Level of Care: Med-Surg [16]  Covid Evaluation: N/A  Diagnosis: Acute encephalopathy [id number]  Admitting Physician: [name] [phone]  Attending Physician: [name] [phone]  Estimated length of stay: past midnight tomorrow  Certification:: I certify this patient will need inpatient services for at least 2 midnights  PT Class (Do Not Modify): Inpatient [101]  PT Acc Code (Do Not Modify): Private [1]       B Medical/Surgery History Past Medical History:  Diagnosis Date  . Alzheimer disease ([place])   . Arthritis   . CAD (coronary artery disease)    Probable CAD, nuclear February, 2012, possible anteroseptal and inferoseptal ischemia but the study was technically difficult  . Cancer ([place])   . Chest pain    Hospital February,  2012, nuclear, possible anteroseptal and inferoseptal ischemia, technically difficult, medical therapy  . Dementia ([place])    Significant  . Diastolic dysfunction    Echo, February, 2012  . Diastolic dysfunction   . DNR (do not resuscitate)   . Dyslipidemia   . Edema    hospitalizations September, 2013, some fluid overload  . Ejection fraction    EF 55%, echo, February, 2012  //   EF 55-60%, echo, February 25, 2012 // Echo 12/17: EF 60-65, no RWMA, Gr 1 DD, normal RVSF  . Essential hypertension   . GI bleed    February, 2012 incomplete colonoscopy, hemorrhoids, needs complete colonoscopy  . 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 Hx: UTI (urinary tract infection)   . Hyperlipidemia   . Leg pain, bilateral   . Major depression    2 overdoses in 1975 & 1977  . Migraines    25 year  . Orthostatic hypotension   . Suicide attempt ([place]) 1985   2 attempts with Valium  . TIA (transient ischemic attack)       ???question of a TIA in February, 2012, carotid Doppler, August 11, 2010 no significant abnormalities   Past Surgical History:  Procedure Laterality Date  . ABDOMINAL HYSTERECTOMY    . TONSILLECTOMY       A IV Location/Drains/Wounds Patient Lines/Drains/Airways Status   Active Line/Drains/Airways    Name:   Placement date:   Placement time:   Site:   Days:   Peripheral IV 03/04/19 Left Antecubital   03/04/19  1530    Antecubital   less than 1          Intake/Output Last 24 hours  Intake/Output Summary (Last 24 hours) at 03/04/2019 2248 Last data filed at 03/04/2019 1953 Gross per 24 hour  Intake 450 ml  Output -  Net 450 ml    Labs/Imaging Results for orders placed or performed during the hospital encounter of 03/04/19 (from the past 48 hour(s))  Lactic acid, plasma     Status: Abnormal   Collection Time: 03/04/19  4:00 PM  Result Value Ref Range   Lactic Acid, Venous 2.5 (HH) 0.5 - 1.9 mmol/L    Comment: CRITICAL  RESULT CALLED TO, READ BACK BY AND VERIFIED WITH: Johnnette Gourd RN @ 1638 ON 03/04/2019 BY TEMOCHE, H Performed at [facility], [address] [place], [place] [address]   Comprehensive metabolic panel     Status: Abnormal   Collection Time: 03/04/19  4:02 PM  Result Value Ref Range   Sodium 128 (L) 135 - 145 mmol/L   Potassium 3.8 3.5 - 5.1 mmol/L   Chloride 95 (L) 98 - 111 mmol/L   CO2 22 22 - 32 mmol/L   Glucose, Bld 148 (H) 70 - 99 mg/dL   BUN 15 8 - 23 mg/dL   Creatinine, Ser 0.93 0.44 - 1.00 mg/dL   Calcium 8.8 (L) 8.9 - 10.3 mg/dL   Total Protein 5.7 (L) 6.5 - 8.1 g/dL   Albumin 3.0 (L) 3.5 - 5.0 g/dL   AST 29 15 - 41 U/L   ALT 34 0 - 44 U/L   Alkaline Phosphatase 75 38 - 126 U/L   Total Bilirubin 1.6 (H) 0.3 - 1.2 mg/dL   GFR calc non Af Amer 55 (L) >60 mL/min   GFR calc Af Amer >60 >60 mL/min   Anion gap 11 5 - 15    Comment: Performed at [facility], [place] [address], [place], [place] [address]  CBC WITH DIFFERENTIAL     Status: Abnormal   Collection Time: 03/04/19  4:02 PM  Result Value Ref Range   WBC 5.7 4.0 - 10.5 K/uL   RBC 4.75 3.87 - 5.11 MIL/uL   Hemoglobin 14.9 12.0 - 15.0 g/dL   HCT 42.3 36.0 - 46.0 %   MCV 89.1 80.0 - 100.0 fL   MCH 31.4 26.0 - 34.0 pg   MCHC 35.2 30.0 - 36.0 g/dL   RDW 12.5 11.5 - 15.5 %   Platelets 130 (L) 150 - 400 K/uL   nRBC 0.0 0.0 - 0.2 %   Neutrophils Relative % 88 %   Neutro Abs 5.0 1.7 - 7.7 K/uL   Lymphocytes Relative 6 %   Lymphs Abs 0.3 (L) 0.7 - 4.0 K/uL   Monocytes Relative 6 %   Monocytes Absolute 0.3 0.1 - 1.0 K/uL   Eosinophils Relative 0 %   Eosinophils Absolute 0.0 0.0 - 0.5 K/uL   Basophils Relative 0 %   Basophils Absolute 0.0 0.0 - 0.1 K/uL   WBC Morphology See Note     Comment: Dohle Bodies Vaculated Neutrophils    Immature Granulocytes 0 %   Abs Immature Granulocytes 0.02 0.00 - 0.07 K/uL    Comment: Performed at [facility], [place] [address], [place], [place] [address]  APTT     Status:  None   Collection Time: 03/04/19  4:02 PM  Result Value Ref Range   aPTT 29 24 - 36 seconds  Comment: Performed at [facility], [place] [address], [place], [place] [address]  Protime-INR     Status: None   Collection Time: 03/04/19  4:02 PM  Result Value Ref Range   Prothrombin Time 14.1 11.4 - 15.2 seconds   INR 1.1 0.8 - 1.2    Comment: (NOTE) INR goal varies based on device and disease states. Performed at [facility], [place] [address], [place], [place] [address]   SARS Coronavirus 2 [facility] order, Performed in [facility] hospital lab) Nasopharyngeal Nasopharyngeal Swab     Status: None   Collection Time: 03/04/19  4:39 PM   Specimen: Nasopharyngeal Swab  Result Value Ref Range   SARS Coronavirus 2 NEGATIVE NEGATIVE    Comment: (NOTE) If result is NEGATIVE SARS-CoV-2 target nucleic acids are NOT DETECTED. The SARS-CoV-2 RNA is generally detectable in upper and lower  respiratory specimens during the acute phase of infection. The lowest  concentration of SARS-CoV-2 viral copies this assay can detect is 250  copies / mL. A negative result does not preclude SARS-CoV-2 infection  and should not be used as the sole basis for treatment or other  patient management decisions.  A negative result may occur with  improper specimen collection / handling, submission of specimen other  than nasopharyngeal swab, presence of viral mutation(s) within the  areas targeted by this assay, and inadequate number of viral copies  (<250 copies / mL). A negative result must be combined with clinical  observations, patient history, and epidemiological information. If result is POSITIVE SARS-CoV-2 target nucleic acids are DETECTED. The SARS-CoV-2 RNA is generally detectable in upper and lower  respiratory specimens dur ing the acute phase of infection.  Positive  results are indicative of active infection with SARS-CoV-2.  Clinical  correlation with patient history and other diagnostic  information is  necessary to determine patient infection status.  Positive results do  not rule out bacterial infection or co-infection with other viruses. If result is PRESUMPTIVE POSTIVE SARS-CoV-2 nucleic acids MAY BE PRESENT.   A presumptive positive result was obtained on the submitted specimen  and confirmed on repeat testing.  While 2019 novel coronavirus  (SARS-CoV-2) nucleic acids may be present in the submitted sample  additional confirmatory testing may be necessary for epidemiological  and / or clinical management purposes  to differentiate between  SARS-CoV-2 and other Sarbecovirus currently known to infect humans.  If clinically indicated additional testing with an alternate test  methodology [phone]) is advised. The SARS-CoV-2 RNA is generally  detectable in upper and lower respiratory sp ecimens during the acute  phase of infection. The expected result is Negative. Fact Sheet for Patients:  StrictlyIdeas.no Fact Sheet for Healthcare Providers: BankingDealers.co.za This test is not yet approved or cleared by the [place] FDA and has been authorized for detection and/or diagnosis of SARS-CoV-2 by FDA under an Emergency Use Authorization (EUA).  This EUA will remain in effect (meaning this test can be used) for the duration of the COVID-19 declaration under Section 564(b)(1) of the Act, 21 U.S.C. section 360bbb-3(b)(1), unless the authorization is terminated or revoked sooner. Performed at [facility], [place] [address] [place], [place] [address]   Urinalysis, Routine w reflex microscopic     Status: Abnormal   Collection Time: 03/04/19  5:40 PM  Result Value Ref Range   Color, Urine AMBER (A) YELLOW    Comment: BIOCHEMICALS MAY BE AFFECTED BY COLOR   APPearance HAZY (A) CLEAR  Specific Gravity, Urine 1.012 1.005 - 1.030   pH 5.0 5.0 - 8.0   Glucose, UA NEGATIVE NEGATIVE mg/dL   Hgb urine dipstick NEGATIVE  NEGATIVE   Bilirubin Urine NEGATIVE NEGATIVE   Ketones, ur NEGATIVE NEGATIVE mg/dL   Protein, ur 30 (A) NEGATIVE mg/dL   Nitrite NEGATIVE NEGATIVE   Leukocytes,Ua NEGATIVE NEGATIVE   RBC / HPF 0-5 0 - 5 RBC/hpf   WBC, UA 0-5 0 - 5 WBC/hpf   Bacteria, UA NONE SEEN NONE SEEN   Mucus PRESENT    Amorphous Crystal PRESENT     Comment: Performed at [facility] [address] [place], [place] [address]  Lactic acid, plasma     Status: Abnormal   Collection Time: 03/04/19  6:26 PM  Result Value Ref Range   Lactic Acid, Venous 2.4 (HH) 0.5 - 1.9 mmol/L    Comment: CRITICAL VALUE NOTED.  VALUE IS CONSISTENT WITH PREVIOUSLY REPORTED AND CALLED VALUE. Performed at [facility], [place] [address], [place], [place] [address]    Ct Abdomen Pelvis Wo Contrast  Result Date: 03/04/2019 CLINICAL DATA:  85 year old female with abdominal and pelvic pain with fever. Known mesenteric mass. EXAM: CT ABDOMEN AND PELVIS WITHOUT CONTRAST TECHNIQUE: Multidetector CT imaging of the abdomen and pelvis was performed following the standard protocol without IV contrast. COMPARISON:  02/27/2019 CT and prior studies FINDINGS: Please note that parenchymal abnormalities may be missed without intravenous contrast. Lower chest: Trace bilateral pleural effusions and bibasilar opacities/atelectasis noted. Cardiomegaly is present. Hepatobiliary: The liver and gallbladder are unremarkable. No biliary dilatation. Pancreas: Unremarkable Spleen: Unremarkable Adrenals/Urinary Tract: The kidneys, adrenal glands and bladder are unremarkable. Stomach/Bowel: The previously identified abdominal mass measures 6.5 x 10 cm and now contains gas. Moderate inflammation and foci of pneumoperitoneum are noted adjacent to the mass. There are mildly distended loops of small bowel within the LEFT abdomen. No other bowel abnormalities are identified. Vascular/Lymphatic: Aortic atherosclerosis. No enlarged abdominal or pelvic lymph nodes.  Reproductive: Status post hysterectomy. No adnexal masses. Other: No ascites or focal collection. Musculoskeletal: No acute or suspicious bony abnormalities identified. IMPRESSION: 1. Large abdominal mass again noted, but now containing gas with moderate adjacent inflammation and foci of free air. This may represent necrosis of this mass with gas formation, superimposed infection or involvement of bowel with perforation. No evidence of abscess. 2. Dilated loops of small bowel within the LEFT abdomen which may represent a focal ileus or developing small bowel obstruction. 3. Trace bilateral pleural effusions with bibasilar opacities/atelectasis. 4.  Aortic Atherosclerosis (ICD10-I70.0). Electronically Signed   By: Margarette Canada M.D.   On: 03/04/2019 17:35   Ct Head Wo Contrast  Result Date: 03/04/2019 CLINICAL DATA:  85 year old female with altered level of consciousness. EXAM: CT HEAD WITHOUT CONTRAST TECHNIQUE: Contiguous axial images were obtained from the base of the skull through the vertex without intravenous contrast. COMPARISON:  03/02/2019 CT and prior studies FINDINGS: Brain: No evidence of acute infarction, hemorrhage, hydrocephalus, extra-axial collection or mass lesion/mass effect. Atrophy and chronic small-vessel white matter ischemic changes again noted. Vascular: Atherosclerotic calcifications noted. Skull: Normal. Negative for fracture or focal lesion. Sinuses/Orbits: No acute finding. Other: None. IMPRESSION: 1. No evidence of acute intracranial abnormality. 2. Atrophy and chronic small-vessel white matter ischemic changes. Electronically Signed   By: Margarette Canada M.D.   On: 03/04/2019 17:20   Dg Chest Port 1 View  Result Date: 03/04/2019 CLINICAL DATA:  Patient with weakness. EXAM: PORTABLE CHEST 1 VIEW  COMPARISON:  Chest radiograph 02/07/2019 FINDINGS: Monitoring leads overlie the patient. Stable cardiomegaly. Low lung volumes. Minimal bibasilar heterogeneous opacities. No pleural effusion or  pneumothorax. Thoracic spine degenerative changes. IMPRESSION: Low lung volumes with bibasilar opacities favored to represent atelectasis. Infection not excluded Electronically Signed   By: Lovey Newcomer M.D.   On: 03/04/2019 16:27    Pending Labs Unresulted Labs (From admission, onward)    Start     Ordered   03/04/19 1602  Blood Culture (routine x 2)  BLOOD CULTURE X 2,   STAT     03/04/19 1602   03/04/19 1602  Urine culture  ONCE - STAT,   STAT     03/04/19 1602          Vitals/Pain Today's Vitals   03/04/19 2130 03/04/19 2145 03/04/19 2230 03/04/19 2245  BP: 106/61 (!) 102/58 (!) 102/54 90/63  Pulse: 93 89 88 91  Resp: (!) 21 18 (!) 23 (!) 24  Temp:      TempSrc:      SpO2: 94% 95% 96% 96%  Weight:      Height:      PainSc:        Isolation Precautions No active isolations  Medications Medications  vancomycin (VANCOCIN) 1,750 mg in sodium chloride 0.9 % 500 mL IVPB (has no administration in time range)  ceFEPIme (MAXIPIME) 2 g in sodium chloride 0.9 % 100 mL IVPB (has no administration in time range)  morphine 100mg  in NS 137mL (1mg /mL) infusion - premix (has no administration in time range)  metroNIDAZOLE (FLAGYL) IVPB 500 mg (0 mg Intravenous Stopped 03/04/19 1721)  vancomycin (VANCOCIN) IVPB 1000 mg/200 mL premix (0 mg Intravenous Stopped 03/04/19 1722)  ceFEPIme (MAXIPIME) 2 g in sodium chloride 0.9 % 100 mL IVPB (0 g Intravenous Stopped 03/04/19 1759)  vancomycin (VANCOCIN) IVPB 750 mg/150 ml premix (0 mg Intravenous Stopped 03/04/19 1953)  sodium chloride 0.9 % bolus 500 mL (500 mLs Intravenous New Bag/Given 03/04/19 2016)  fentaNYL (SUBLIMAZE) injection 50 mcg (50 mcg Intravenous Given 03/04/19 2122)    Mobility walks Moderate fall risk   Focused Assessments Neuro Assessment Handoff:  Swallow screen pass? NA         Neuro Assessment:   Neuro Checks:      Last Documented NIHSS Modified Score:   Has TPA been given? No If patient is a Neuro Trauma and  patient is going to OR before floor call report to [facility] [place] nurse: [phone] or [phone]     R Recommendations: See Admitting Provider Note  Report given to:   Additional Notes:

## 2019-03-05 ENCOUNTER — Other Ambulatory Visit: Payer: Self-pay

## 2019-03-05 ENCOUNTER — Encounter (HOSPITAL_COMMUNITY): Payer: Self-pay | Admitting: *Deleted

## 2019-03-05 DIAGNOSIS — K631 Perforation of intestine (nontraumatic): Secondary | ICD-10-CM | POA: Diagnosis present

## 2019-03-05 DIAGNOSIS — K659 Peritonitis, unspecified: Secondary | ICD-10-CM

## 2019-03-05 DIAGNOSIS — Z515 Encounter for palliative care: Secondary | ICD-10-CM

## 2019-03-05 DIAGNOSIS — A419 Sepsis, unspecified organism: Secondary | ICD-10-CM

## 2019-03-05 DIAGNOSIS — Z7189 Other specified counseling: Secondary | ICD-10-CM

## 2019-03-05 DIAGNOSIS — K56609 Unspecified intestinal obstruction, unspecified as to partial versus complete obstruction: Secondary | ICD-10-CM | POA: Diagnosis present

## 2019-03-05 LAB — URINE CULTURE
Culture: >=100000 — AB
Culture: NO GROWTH

## 2019-03-05 LAB — BLOOD CULTURE ID PANEL (REFLEXED)

## 2019-03-05 MED ORDER — FENTANYL CITRATE (PF) 100 MCG/2ML IJ SOLN
25.0000 ug | INTRAMUSCULAR | Status: DC | PRN
  Administered 2019-03-05 – 2019-03-09 (×10): 50 ug via INTRAVENOUS
  Filled 2019-03-05 (×10): qty 2

## 2019-03-05 MED ORDER — ONDANSETRON HCL 4 MG PO TABS: 4.0000 mg | ORAL_TABLET | Freq: Four times a day (QID) | ORAL | Status: DC | PRN

## 2019-03-05 MED ORDER — ACETAMINOPHEN 650 MG RE SUPP
650.0000 mg | Freq: Four times a day (QID) | RECTAL | Status: DC | PRN
  Administered 2019-03-05 – 2019-03-07 (×4): 650 mg via RECTAL
  Filled 2019-03-05 (×4): qty 1

## 2019-03-05 MED ORDER — ACETAMINOPHEN 325 MG PO TABS: 650.0000 mg | ORAL_TABLET | Freq: Four times a day (QID) | ORAL | Status: DC | PRN

## 2019-03-05 MED ORDER — SODIUM CHLORIDE 0.9 % IV SOLN
INTRAVENOUS | Status: DC
  Administered 2019-03-05 – 2019-03-07 (×4): via INTRAVENOUS

## 2019-03-05 MED ORDER — ONDANSETRON HCL 4 MG/2ML IJ SOLN
4.0000 mg | Freq: Four times a day (QID) | INTRAMUSCULAR | Status: DC | PRN
  Administered 2019-03-05: 4 mg via INTRAVENOUS
  Filled 2019-03-05: qty 2

## 2019-03-05 NOTE — Telephone Encounter (Signed)
Yes, place order.

## 2019-03-05 NOTE — Progress Notes (Signed)
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 fib was noted to have increase generalized weakness over the last 2 days.  She was treated for UTI with antibiotics.  Patient had an acute change in mental status and worsening abdominal pain and vomiting the day of admission.  Patient had a temperature of 101 at home.  Evaluation in the ED patient was found to be encephalopathic with minimal response, sodium 128 creatinine 0.9, lactate of 2.5, hemoglobin 14, white blood cell 5.7.  CT abdomen and pelvis showed possible necrosis of the tumor with possible perforation and small bowel obstruction. Initially family discussed with the ER physician who requested no active measure and no surgical intervention and keep patient in comfort.  Plan was to continue with IV antibiotics.  Admitting physician and spoke with daughter and she confirmed admission for confirmation and will morphine drip was going to be started.  Morphine drip was not started awaiting for daughter to come to bedside.    Assessment & Plan:   Principal Problem:   Acute encephalopathy Active Problems:   SBO (small bowel obstruction) (HCC)   Perforation bowel (HCC)   Abdominal infection ([place])   Palliative care encounter  1-known Abdominal mass, containing gas inflammation and foci of free air.  Necrosis of the mass with gas formation superimposed infection or involvement of bowel with perforation.  Dilated loop of small bowel within the left abdomen which may represent focal ileus or developing small bowel obstruction: -Continue with IV fluids, IV vancomycin and cefepime. -Family would like to speak with surgery regarding options for surgery risk and benefits. -They  would like to speak with surgery before they speak with palliative care, proceed with comfort care measures -Family is okay with fentanyl PRN for pain, hold morphine drip for now until further clarification of goals of cares. -Added Dr. Marin Olp to the rounding list.  Dr. Marin Olp agrees with comfort care measures.  2-Acute toxic metabolic encephalopathy: Likely related to infection. Treat underlying infection.  3-Possible developing sepsis: Continue with IV fluids and IV antibiotics.  4-History of dementia: Supportive care. 5-History of A. fib: Not on anticoagulation  Estimated body mass index is 31 kg/m as calculated from the following:   Height as of this encounter: 5\' 3"  (1.6 m).   Weight as of this encounter: 79.4 kg.   DVT prophylaxis: SCDs Code Status: DNR Family Communication: Daughter over the phone Disposition Plan: Continue with IV antibiotics for now, surgery consultation Consultants:   Surgery  Palliative  Procedures:   none  Antimicrobials:  Vancomycin 9-13 Cefepime 9/13  Subjective: Family wants to speak with surgery in regards options for surgery before they proceed with comfort care/  They would like to speak with surgery before they speak with Palliative.  They wants to continue with IV antibiotics and Pain management at this time.  Family is asking for CEA level.  Objective: Vitals:   03/04/19 2245 03/04/19 2336 03/05/19 0249 03/05/19 0830  BP: 90/63 104/89  119/61  Pulse: 91 82  88  Resp: (!) 24 18  (!) 26  Temp:  97.9 F (36.6 C)  98.5 F (36.9 C)  TempSrc:  Oral  Oral  SpO2: 96% 96%  95%  Weight:   79.4 kg   Height:  Intake/Output Summary (Last 24 hours) at 03/05/2019 0937 Last data filed at 03/05/2019 0820 Gross per 24 hour  Intake 450 ml  Output 0 ml  Net 450 ml   Filed Weights   03/04/19 1536 03/05/19 0249  Weight: 75.8 kg 79.4 kg    Examination:  General exam: Appears calm and comfortable  Respiratory system: Clear to  auscultation. Respiratory effort normal. Cardiovascular system: S1 & S2 heard, RRR. No JVD, murmurs, rubs, gallops or clicks. No pedal edema. Gastrointestinal system: Abdomen is nondistended, soft and nontender. No organomegaly or masses felt. Normal bowel sounds heard. Central nervous system: Alert and oriented. No focal neurological deficits. Extremities: Symmetric 5 x 5 power. Skin: No rashes, lesions or ulcers Psychiatry: Judgement and insight appear normal. Mood & affect appropriate.     Data Reviewed: I have personally reviewed following labs and imaging studies  CBC: Recent Labs  Lab 03/02/19 1245 03/04/19 1602  WBC 11.6* 5.7  NEUTROABS 9.4* 5.0  HGB 15.2* 14.9  HCT 45.9 42.3  MCV 93.1 89.1  PLT 152 [id number]*   Basic Metabolic Panel: Recent Labs  Lab 03/02/19 1245 03/04/19 1602  NA 135 128*  K 3.8 3.8  CL 101 95*  CO2 27 22  GLUCOSE 123* 148*  BUN 11 15  CREATININE 0.77 0.93  CALCIUM 9.2 8.8*   GFR: Estimated Creatinine Clearance: 41.7 mL/min (by C-G formula based on SCr of 0.93 mg/dL). Liver Function Tests: Recent Labs  Lab 03/02/19 1245 03/04/19 1602  AST 23 29  ALT 27 34  ALKPHOS 68 75  BILITOT 1.6* 1.6*  PROT 6.5 5.7*  ALBUMIN 3.7 3.0*   No results for input(s): LIPASE, AMYLASE in the last 168 hours. No results for input(s): AMMONIA in the last 168 hours. Coagulation Profile: Recent Labs  Lab 03/04/19 1602  INR 1.1   Cardiac Enzymes: No results for input(s): CKTOTAL, CKMB, CKMBINDEX, TROPONINI in the last 168 hours. BNP (last 3 results) No results for input(s): PROBNP in the last 8760 hours. HbA1C: No results for input(s): HGBA1C in the last 72 hours. CBG: No results for input(s): GLUCAP in the last 168 hours. Lipid Profile: No results for input(s): CHOL, HDL, LDLCALC, TRIG, CHOLHDL, LDLDIRECT in the last 72 hours. Thyroid Function Tests: No results for input(s): TSH, T4TOTAL, FREET4, T3FREE, THYROIDAB in the last 72 hours. Anemia Panel:  No results for input(s): VITAMINB12, FOLATE, FERRITIN, TIBC, IRON, RETICCTPCT in the last 72 hours. Sepsis Labs: Recent Labs  Lab 03/04/19 1600 03/04/19 1826  LATICACIDVEN 2.5* 2.4*    Recent Results (from the past 240 hour(s))  Urine culture     Status: Abnormal   Collection Time: 03/02/19  3:01 PM   Specimen: Urine, Clean Catch  Result Value Ref Range Status   Specimen Description   Final    URINE, CLEAN CATCH Performed at [facility], [place] [address], [place], [place] [address]    Special Requests   Final    NONE Performed at [facility], [place] [address] [place], [place] [address]    Culture >=100,000 COLONIES/mL ENTEROCOCCUS FAECALIS (A)  Final   Report Status 03/05/2019 FINAL  Final   Organism ID, Bacteria ENTEROCOCCUS FAECALIS (A)  Final      Susceptibility   Enterococcus faecalis - MIC*    AMPICILLIN <=2 SENSITIVE Sensitive     LEVOFLOXACIN 2 SENSITIVE Sensitive     NITROFURANTOIN <=16 SENSITIVE Sensitive     VANCOMYCIN 2 SENSITIVE Sensitive     * >=100,000  COLONIES/mL ENTEROCOCCUS FAECALIS  SARS Coronavirus 2 [facility] order, Performed in [facility] hospital lab) Nasopharyngeal Nasopharyngeal Swab     Status: None   Collection Time: 03/04/19  4:39 PM   Specimen: Nasopharyngeal Swab  Result Value Ref Range Status   SARS Coronavirus 2 NEGATIVE NEGATIVE Final    Comment: (NOTE) If result is NEGATIVE SARS-CoV-2 target nucleic acids are NOT DETECTED. The SARS-CoV-2 RNA is generally detectable in upper and lower  respiratory specimens during the acute phase of infection. The lowest  concentration of SARS-CoV-2 viral copies this assay can detect is 250  copies / mL. A negative result does not preclude SARS-CoV-2 infection  and should not be used as the sole basis for treatment or other  patient management decisions.  A negative result may occur with  improper specimen collection / handling, submission of specimen other  than  nasopharyngeal swab, presence of viral mutation(s) within the  areas targeted by this assay, and inadequate number of viral copies  (<250 copies / mL). A negative result must be combined with clinical  observations, patient history, and epidemiological information. If result is POSITIVE SARS-CoV-2 target nucleic acids are DETECTED. The SARS-CoV-2 RNA is generally detectable in upper and lower  respiratory specimens dur ing the acute phase of infection.  Positive  results are indicative of active infection with SARS-CoV-2.  Clinical  correlation with patient history and other diagnostic information is  necessary to determine patient infection status.  Positive results do  not rule out bacterial infection or co-infection with other viruses. If result is PRESUMPTIVE POSTIVE SARS-CoV-2 nucleic acids MAY BE PRESENT.   A presumptive positive result was obtained on the submitted specimen  and confirmed on repeat testing.  While 2019 novel coronavirus  (SARS-CoV-2) nucleic acids may be present in the submitted sample  additional confirmatory testing may be necessary for epidemiological  and / or clinical management purposes  to differentiate between  SARS-CoV-2 and other Sarbecovirus currently known to infect humans.  If clinically indicated additional testing with an alternate test  methodology [phone]) is advised. The SARS-CoV-2 RNA is generally  detectable in upper and lower respiratory sp ecimens during the acute  phase of infection. The expected result is Negative. Fact Sheet for Patients:  StrictlyIdeas.no Fact Sheet for Healthcare Providers: BankingDealers.co.za This test is not yet approved or cleared by the [place] FDA and has been authorized for detection and/or diagnosis of SARS-CoV-2 by FDA under an Emergency Use Authorization (EUA).  This EUA will remain in effect (meaning this test can be used) for the duration of the  COVID-19 declaration under Section 564(b)(1) of the Act, 21 U.S.C. section 360bbb-3(b)(1), unless the authorization is terminated or revoked sooner. Performed at [facility], [place] [address], [place], [place] [address]          Radiology Studies: Ct Abdomen Pelvis Wo Contrast  Result Date: 03/04/2019 CLINICAL DATA:  85 year old female with abdominal and pelvic pain with fever. Known mesenteric mass. EXAM: CT ABDOMEN AND PELVIS WITHOUT CONTRAST TECHNIQUE: Multidetector CT imaging of the abdomen and pelvis was performed following the standard protocol without IV contrast. COMPARISON:  02/27/2019 CT and prior studies FINDINGS: Please note that parenchymal abnormalities may be missed without intravenous contrast. Lower chest: Trace bilateral pleural effusions and bibasilar opacities/atelectasis noted. Cardiomegaly is present. Hepatobiliary: The liver and gallbladder are unremarkable. No biliary dilatation. Pancreas: Unremarkable Spleen: Unremarkable Adrenals/Urinary Tract: The kidneys, adrenal glands and bladder are unremarkable. Stomach/Bowel: The previously identified abdominal mass measures 6.5  x 10 cm and now contains gas. Moderate inflammation and foci of pneumoperitoneum are noted adjacent to the mass. There are mildly distended loops of small bowel within the LEFT abdomen. No other bowel abnormalities are identified. Vascular/Lymphatic: Aortic atherosclerosis. No enlarged abdominal or pelvic lymph nodes. Reproductive: Status post hysterectomy. No adnexal masses. Other: No ascites or focal collection. Musculoskeletal: No acute or suspicious bony abnormalities identified. IMPRESSION: 1. Large abdominal mass again noted, but now containing gas with moderate adjacent inflammation and foci of free air. This may represent necrosis of this mass with gas formation, superimposed infection or involvement of bowel with perforation. No evidence of abscess. 2. Dilated loops of small bowel within the LEFT  abdomen which may represent a focal ileus or developing small bowel obstruction. 3. Trace bilateral pleural effusions with bibasilar opacities/atelectasis. 4.  Aortic Atherosclerosis (ICD10-I70.0). Electronically Signed   By: Margarette Canada M.D.   On: 03/04/2019 17:35   Ct Head Wo Contrast  Result Date: 03/04/2019 CLINICAL DATA:  85 year old female with altered level of consciousness. EXAM: CT HEAD WITHOUT CONTRAST TECHNIQUE: Contiguous axial images were obtained from the base of the skull through the vertex without intravenous contrast. COMPARISON:  03/02/2019 CT and prior studies FINDINGS: Brain: No evidence of acute infarction, hemorrhage, hydrocephalus, extra-axial collection or mass lesion/mass effect. Atrophy and chronic small-vessel white matter ischemic changes again noted. Vascular: Atherosclerotic calcifications noted. Skull: Normal. Negative for fracture or focal lesion. Sinuses/Orbits: No acute finding. Other: None. IMPRESSION: 1. No evidence of acute intracranial abnormality. 2. Atrophy and chronic small-vessel white matter ischemic changes. Electronically Signed   By: Margarette Canada M.D.   On: 03/04/2019 17:20   Dg Chest Port 1 View  Result Date: 03/04/2019 CLINICAL DATA:  Patient with weakness. EXAM: PORTABLE CHEST 1 VIEW COMPARISON:  Chest radiograph 02/07/2019 FINDINGS: Monitoring leads overlie the patient. Stable cardiomegaly. Low lung volumes. Minimal bibasilar heterogeneous opacities. No pleural effusion or pneumothorax. Thoracic spine degenerative changes. IMPRESSION: Low lung volumes with bibasilar opacities favored to represent atelectasis. Infection not excluded Electronically Signed   By: Lovey Newcomer M.D.   On: 03/04/2019 16:27        Scheduled Meds: Continuous Infusions: . sodium chloride    . ceFEPime (MAXIPIME) IV 2 g (03/05/19 0615)  . [START ON 03/06/2019] vancomycin       LOS: 1 day    Time spent: 35 minutes.     Elmarie Shiley, MD [facility] Pager  [phone]  If 7PM-7AM, please contact night-coverage [email or web address] Password [facility] 03/05/2019, 9:37 AM

## 2019-03-05 NOTE — H&P (Signed)
History and Physical    Valerie Chen [id number] DOB: 1929-01-26 DOA: 03/04/2019  PCP: Briscoe Deutscher, DO  Patient coming from: Home.  History obtained from patient's daughter patient's caregiver, ER physician patient is encephalopathic.  Chief Complaint: Nausea vomiting altered mental status.  HPI: Valerie Chen is a 85 y.o. female with no history of abdominal tumor GIST, dementia, CAD, diastolic dysfunction, A. fib was noticed to have increasingly weak over the last 2 days.  Was treated for UTI with antibiotics and also was noted to have some weakness on the right upper extremity.  History Patient had a change in mental status acutely with vomiting and was also showing some signs of abdominal discomfort.  Patient was brought to the ER.  Per caregiver patient also was noticed to have recorded fever of 101 degrees at home.  ED Course: In the ER patient is encephalopathic with minimal response labs show sodium is 128 creatinine 0.93 albumin 3 lactate 2.5 hemoglobin 14.9 WBC 5.7.  CT abdomen pelvis done shows possible necrosis of the tumor with possible perforation and also small bowel obstruction.  At this time family had discussed with the ER physician who requested no active measures and no surgical intervention and keep patient comfort.  But to continue antibiotics.  I also confirmed this with the patient's daughter and patient has been admitted for comfort measures and after discussing with patient's daughter morphine drip will be started.  CT of the head was unremarkable.  Review of Systems: As per HPI, rest all negative.   Past Medical History:  Diagnosis Date   Alzheimer disease ([place])    Arthritis    CAD (coronary artery disease)    Probable CAD, nuclear February, 2012, possible anteroseptal and inferoseptal ischemia but the study was technically difficult   Cancer [facility])    Chest pain    Hospital February, 2012, nuclear, possible anteroseptal and inferoseptal ischemia,  technically difficult, medical therapy   Dementia ([place])    Significant   Diastolic dysfunction    Echo, February, 0000000   Diastolic dysfunction    DNR (do not resuscitate)    Dyslipidemia    Edema    hospitalizations September, 2013, some fluid overload   Ejection fraction    EF 55%, echo, February, 2012  //   EF 55-60%, echo, February 25, 2012 // Echo 12/17: EF 60-65, no RWMA, Gr 1 DD, normal RVSF   Essential hypertension    GI bleed    February, 2012 incomplete colonoscopy, hemorrhoids, needs complete colonoscopy   GIST (gastrointestinal stromal tumor) of small bowel, 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 She has never used smokeless tobacco. She reports current drug use. She reports that she does not drink alcohol.  Allergies  Allergen Reactions   Levaquin [Levofloxacin] Other (See Comments)    Altered mental status    Rocephin [Ceftriaxone] Other (  See Comments)    Uncontrollable chills/shaking, dizziness, and disorientation after receiving this (??)   Statins Other (See Comments)    Severe myalgias    Family History  Problem Relation Age of Onset   Heart attack Father    Cancer Father    Arthritis Father    Suicidality Mother    Depression Mother    Heart attack Brother    Heart disease Brother    Pneumonia Brother    Suicidality Son    Hypertension Daughter    Depression Other     Prior to Admission medications   Medication  Sig Start Date End Date Taking? Authorizing Provider  acetaminophen (TYLENOL) 500 MG tablet Take 1,000 mg by mouth every 6 (six) hours as needed for moderate pain.   Yes [provider]  Ascorbic Acid (VITAMIN C) 1000 MG tablet Take 1,000 mg by mouth every morning.    Yes [provider]  aspirin EC 81 MG tablet Take 81 mg by mouth every morning.    Yes [provider]  Cholecalciferol (VITAMIN D-3) 1000 units CAPS Take 1,000 Units by mouth every morning.    Yes [provider]  cyanocobalamin 500 MCG tablet Take 500 mcg by mouth every morning.    Yes [provider]  Multiple Vitamin (MULTIVITAMIN WITH MINERALS) TABS tablet Take 1 tablet by mouth every morning.    Yes [provider]  nitrofurantoin, macrocrystal-monohydrate, (MACROBID) 100 MG capsule Take 1 capsule (100 mg total) by mouth 2 (two) times daily. 03/02/19  Yes Lawyer, Harrell Gave, PA-C  Omega-3 Fatty Acids (OMEGA-3 FISH OIL) 300 MG CAPS Take 2 capsules by mouth every morning.    Yes [provider]  OVER THE COUNTER MEDICATION Take 1 capsule by mouth 2 (two) times daily with a meal. "Fem Dophilus" for urinary tract health   Yes [provider]  OVER THE COUNTER MEDICATION Take 5 mLs by mouth daily. D-Mannose powder - for urinary tract health - mix in 6 oz water and drink   Yes [provider]    Physical Exam: Constitutional: Moderately built and nourished. Vitals:   03/04/19 2145 03/04/19 2230 03/04/19 2245 03/04/19 2336  BP: (!) 102/58 (!) 102/54 90/63 104/89  Pulse: 89 88 91 82  Resp: 18 (!) 23 (!) 24 18  Temp:    97.9 F (36.6 C)  TempSrc:    Oral  SpO2: 95% 96% 96% 96%  Weight:      Height:       Eyes: Anicteric no pallor. ENMT: No discharge from the ears eyes nose or mouth. Neck: No mass or.  No neck rigidity. Respiratory: No rhonchi or crepitations. Cardiovascular: S1-S2 heard. Abdomen: Soft presently encephalopathic.  Bowel sounds  not appreciated. Musculoskeletal: No edema. Skin: No rash. Neurologic: Patient is encephalopathic and difficult to assess further neurological exam. Psychiatric: Patient is encephalopathic.   Labs on Admission: I have personally reviewed following labs and imaging studies  CBC: Recent Labs  Lab 03/02/19 1245 03/04/19 1602  WBC 11.6* 5.7  NEUTROABS 9.4* 5.0  HGB 15.2* 14.9  HCT 45.9 42.3  MCV 93.1 89.1  PLT 152 [id number]*   Basic Metabolic Panel: Recent Labs  Lab 03/02/19 1245 03/04/19 1602  NA 135 128*  K 3.8 3.8  CL 101 95*  CO2 27 22  GLUCOSE 123* 148*  BUN 11 15  CREATININE 0.77 0.93  CALCIUM 9.2 8.8*   GFR: Estimated Creatinine Clearance: 40.8 mL/min (by C-G formula based on SCr of  0.93 mg/dL). Liver Function Tests: Recent Labs  Lab 03/02/19 1245 03/04/19 1602  AST 23 29  ALT 27 34  ALKPHOS 68 75  BILITOT 1.6* 1.6*  PROT 6.5 5.7*  ALBUMIN 3.7 3.0*   No results for input(s): LIPASE, AMYLASE in the last 168 hours. No results for input(s): AMMONIA in the last 168 hours. Coagulation Profile: Recent Labs  Lab 03/04/19 1602  INR 1.1   Cardiac Enzymes: No results for input(s): CKTOTAL, CKMB, CKMBINDEX, TROPONINI in the last 168 hours. BNP (last 3 results) No results for input(s): PROBNP in the last 8760 hours. HbA1C: No results for input(s): HGBA1C in the last 72 hours. CBG: No results for input(s): GLUCAP in the last 168 hours. Lipid Profile: No results for input(s): CHOL, HDL, LDLCALC, TRIG, CHOLHDL, LDLDIRECT in the last 72 hours. Thyroid Function Tests: No results for input(s): TSH, T4TOTAL, FREET4, T3FREE, THYROIDAB in the last 72 hours. Anemia Panel: No results for input(s): VITAMINB12, FOLATE, FERRITIN, TIBC, IRON, RETICCTPCT in the last 72 hours. Urine analysis:    Component Value Date/Time   COLORURINE AMBER (A) 03/04/2019 1740   APPEARANCEUR HAZY (A) 03/04/2019 1740   LABSPEC 1.012 03/04/2019 1740   PHURINE 5.0 03/04/2019 1740   GLUCOSEU  NEGATIVE 03/04/2019 1740   GLUCOSEU NEGATIVE 12/26/2018 1055   HGBUR NEGATIVE 03/04/2019 1740   BILIRUBINUR NEGATIVE 03/04/2019 1740   BILIRUBINUR Negative 06/06/2018 1450   KETONESUR NEGATIVE 03/04/2019 1740   PROTEINUR 30 (A) 03/04/2019 1740   UROBILINOGEN 0.2 12/26/2018 1055   NITRITE NEGATIVE 03/04/2019 1740   LEUKOCYTESUR NEGATIVE 03/04/2019 1740   Sepsis Labs: @LABRCNTIP (procalcitonin:4,lacticidven:4) ) Recent Results (from the past 240 hour(s))  Urine culture     Status: Abnormal (Preliminary result)   Collection Time: 03/02/19  3:01 PM   Specimen: Urine, Clean Catch  Result Value Ref Range Status   Specimen Description   Final    URINE, CLEAN CATCH Performed at [facility], [place] [address] [place], [place] [address]    Special Requests   Final    NONE Performed at [facility], [place] [address] [place], [place] [address]    Culture (A)  Final    >=100,000 COLONIES/mL ENTEROCOCCUS FAECALIS SUSCEPTIBILITIES TO FOLLOW Performed at [facility], [place] [address], [place], [place] [address]    Report Status PENDING  Incomplete  SARS Coronavirus 2 [facility] order, Performed in [facility] hospital lab) Nasopharyngeal Nasopharyngeal Swab     Status: None   Collection Time: 03/04/19  4:39 PM   Specimen: Nasopharyngeal Swab  Result Value Ref Range Status   SARS Coronavirus 2 NEGATIVE NEGATIVE Final    Comment: (NOTE) If result is NEGATIVE SARS-CoV-2 target nucleic acids are NOT DETECTED. The SARS-CoV-2 RNA is generally detectable in upper and lower  respiratory specimens during the acute phase of infection. The lowest  concentration of SARS-CoV-2 viral copies this assay can detect is 250  copies / mL. A negative result does not preclude SARS-CoV-2 infection  and should not be used as the sole basis for treatment or other  patient management decisions.  A negative result may occur with  improper specimen collection / handling,  submission of specimen other  than nasopharyngeal swab, presence of viral mutation(s) within the  areas targeted by this assay, and inadequate number of viral copies  (<250 copies / mL). A negative result must be combined with clinical  observations, patient history, and epidemiological information. If result is POSITIVE SARS-CoV-2 target nucleic acids are  DETECTED. The SARS-CoV-2 RNA is generally detectable in upper and lower  respiratory specimens dur ing the acute phase of infection.  Positive  results are indicative of active infection with SARS-CoV-2.  Clinical  correlation with patient history and other diagnostic information is  necessary to determine patient infection status.  Positive results do  not rule out bacterial infection or co-infection with other viruses. If result is PRESUMPTIVE POSTIVE SARS-CoV-2 nucleic acids MAY BE PRESENT.   A presumptive positive result was obtained on the submitted specimen  and confirmed on repeat testing.  While 2019 novel coronavirus  (SARS-CoV-2) nucleic acids may be present in the submitted sample  additional confirmatory testing may be necessary for epidemiological  and / or clinical management purposes  to differentiate between  SARS-CoV-2 and other Sarbecovirus currently known to infect humans.  If clinically indicated additional testing with an alternate test  methodology [id number]) is advised. The SARS-CoV-2 RNA is generally  detectable in upper and lower respiratory sp ecimens during the acute  phase of infection. The expected result is Negative. Fact Sheet for Patients:  StrictlyIdeas.no Fact Sheet for Healthcare Providers: BankingDealers.co.za This test is not yet approved or cleared by the [place] FDA and has been authorized for detection and/or diagnosis of SARS-CoV-2 by FDA under an Emergency Use Authorization (EUA).  This EUA will remain in effect (meaning this test can be  used) for the duration of the COVID-19 declaration under Section 564(b)(1) of the Act, 21 U.S.C. section 360bbb-3(b)(1), unless the authorization is terminated or revoked sooner. Performed at [facility], [place] [address], [place], [place] [address]      Radiological Exams on Admission: Ct Abdomen Pelvis Wo Contrast  Result Date: 03/04/2019 CLINICAL DATA:  85 year old female with abdominal and pelvic pain with fever. Known mesenteric mass. EXAM: CT ABDOMEN AND PELVIS WITHOUT CONTRAST TECHNIQUE: Multidetector CT imaging of the abdomen and pelvis was performed following the standard protocol without IV contrast. COMPARISON:  02/27/2019 CT and prior studies FINDINGS: Please note that parenchymal abnormalities may be missed without intravenous contrast. Lower chest: Trace bilateral pleural effusions and bibasilar opacities/atelectasis noted. Cardiomegaly is present. Hepatobiliary: The liver and gallbladder are unremarkable. No biliary dilatation. Pancreas: Unremarkable Spleen: Unremarkable Adrenals/Urinary Tract: The kidneys, adrenal glands and bladder are unremarkable. Stomach/Bowel: The previously identified abdominal mass measures 6.5 x 10 cm and now contains gas. Moderate inflammation and foci of pneumoperitoneum are noted adjacent to the mass. There are mildly distended loops of small bowel within the LEFT abdomen. No other bowel abnormalities are identified. Vascular/Lymphatic: Aortic atherosclerosis. No enlarged abdominal or pelvic lymph nodes. Reproductive: Status post hysterectomy. No adnexal masses. Other: No ascites or focal collection. Musculoskeletal: No acute or suspicious bony abnormalities identified. IMPRESSION: 1. Large abdominal mass again noted, but now containing gas with moderate adjacent inflammation and foci of free air. This may represent necrosis of this mass with gas formation, superimposed infection or involvement of bowel with perforation. No evidence of abscess. 2. Dilated  loops of small bowel within the LEFT abdomen which may represent a focal ileus or developing small bowel obstruction. 3. Trace bilateral pleural effusions with bibasilar opacities/atelectasis. 4.  Aortic Atherosclerosis (ICD10-I70.0). Electronically Signed   By: Margarette Canada M.D.   On: 03/04/2019 17:35   Ct Head Wo Contrast  Result Date: 03/04/2019 CLINICAL DATA:  85 year old female with altered level of consciousness. EXAM: CT HEAD WITHOUT CONTRAST TECHNIQUE: Contiguous axial images were obtained from the base of the skull through the vertex without intravenous  contrast. COMPARISON:  03/02/2019 CT and prior studies FINDINGS: Brain: No evidence of acute infarction, hemorrhage, hydrocephalus, extra-axial collection or mass lesion/mass effect. Atrophy and chronic small-vessel white matter ischemic changes again noted. Vascular: Atherosclerotic calcifications noted. Skull: Normal. Negative for fracture or focal lesion. Sinuses/Orbits: No acute finding. Other: None. IMPRESSION: 1. No evidence of acute intracranial abnormality. 2. Atrophy and chronic small-vessel white matter ischemic changes. Electronically Signed   By: Margarette Canada M.D.   On: 03/04/2019 17:20   Dg Chest Port 1 View  Result Date: 03/04/2019 CLINICAL DATA:  Patient with weakness. EXAM: PORTABLE CHEST 1 VIEW COMPARISON:  Chest radiograph 02/07/2019 FINDINGS: Monitoring leads overlie the patient. Stable cardiomegaly. Low lung volumes. Minimal bibasilar heterogeneous opacities. No pleural effusion or pneumothorax. Thoracic spine degenerative changes. IMPRESSION: Low lung volumes with bibasilar opacities favored to represent atelectasis. Infection not excluded Electronically Signed   By: Lovey Newcomer M.D.   On: 03/04/2019 16:27    EKG: Independently reviewed.  A. fib rate around 90 bpm.  Assessment/Plan Principal Problem:   Acute encephalopathy Active Problems:   SBO (small bowel obstruction) (HCC)   Perforation bowel (HCC)   Abdominal  infection ([place])    1. Acute toxic metabolic encephalopathy likely from intra-abdominal cause. 2. Possible perforated viscus with small bowel obstruction and necrosis of the tumor in the abdomen. 3. Possible developing sepsis on empiric antibiotics. 4. History of dementia presently encephalopathic. 5. History of A. fib not on anticoagulation per request of family. 6. History of CAD per the chart.  Plan -at this time after discussing with patient's daughter caregiver patient family has decided to make patient only comfort measures.  However family requested to continue with empiric antibiotics and get palliative care consult.  Will start patient on morphine drip and goal is to keep patient comfortable.  Patient is a DNR as confirmed with patient's daughter.  Given that patient has Multiple comorbidities at this time and likely to further deteriorate will need inpatient status.   DVT prophylaxis: Comfort measures. Code Status: DNR. Family Communication: Patient's daughter. Disposition Plan: To be determined. Consults called: Palliative care. Admission status: Inpatient.   [name] MD [facility] Pager [phone].  If 7PM-7AM, please contact night-coverage [email or web address] Password [facility]  03/05/2019, 12:57 AM

## 2019-03-05 NOTE — Consult Note (Addendum)
[facility] Surgery Consult Note  Valerie Chen Jun 20, 1929  [id number].    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  She has fairly significant dementia and she was very functional up until last week.  She was treated for UTI and improved again.  Over the weekend she again had a significant decline in her mental status and functional status.  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  She has been followed for a mesenteric mass since August 2018.    Patient first presented in August 2018 with a 6.4 x 4 x 5.1 cm mesenteric mass centered in the mesenteric fat did not contain small bowel and there is no associated small bowel obstruction, no perforation.  She was seen by Dr. Burney Gauze the oncology service.  Family was not interested in invasive procedures.  He is followed the mass and is slowly grown over this time.  Repeat CT on 04/04/2017 showed it was 5.9 x 3.9 x 7.6 cm.  There was extensive pneumatosis in the right colon with gas dissecting into the right upper retroperitoneal space.  There was no portal gas she was asymptomatic and the family did not want further treatment.  Repeat CT on 07/05/2017 showed the mass measured 6.8 x 5.6 x 4 cm.  Again she was deemed a poor candidate for surgical intervention.  Her last visit on 01/12/2018 with Dr. Marin Olp showed the mass measured 7.8 x 4.7 cm and appeared to be associated with a loop of small bowel.  He recommended chemotherapy,Gleevec, and the family requested a surgical  consultation. Patient was seen by Dr. Stark Klein on 03/10/2018.  It was her opinion is tumor might not be resectable.  She noted it could be a GIST tumor or a desmoid tumor.  Because of the patient's Alzheimer's and anxiety she recommended discontinuing further CTs.  It was her opinion that she would not recommend emergent surgery should she develop bowel obstruction or necrosis because this would not improve the quality of her life.  She was brought to the emergency department on 03/02/2019 with mental status changes.  Work-up at that time led to the finding of probable UTI.  Urine culture grew greater than 100,000 colonies of enterococcus faecalis.   She was treated for a UTI and discharged. Yesterday she returned to the ED with mental status changes, she was lethargic and difficult to arouse.  She also had a fever up to 101.  Work-up in the ED: She was afebrile she was not significantly tachycardic but had one episode of heart rate up to 139.  Labs show a sodium of 128, CO2 22 glucose of 148 creatinine is 0.93 bilirubin 1.6 total protein of 5.7 white count was 5.7, hemoglobin 14.9 hematocrit 42.3, platelets 130,000.  Lactate was 2.5.  Repeat UA was unremarkable.  Chest x-ray showed low lung volumes with basilar opacities favoring atelectasis.  CT of the head showed no evidence of acute intracranial abnormality just chronic small vessel white matter ischemia and atrophy.   CT of the abdomen pelvis on admission 03/05/2019.  This shows previously identified abdominal mass now  measures 6.5 x 10 cm and contains gas.  Moderate inflammation and foci of pneumoperitoneum are not adjacent to the mass there are mildly distended loops of small bowel within the left abdomen.  It was their opinion this may represent necrosis of the mass with gas formation superimposed on infection or involvement of bowel perforation there is no evidence of abscess the dilated loops of small bowel are in the left abdomen which may be  ileus or developing small bowel obstruction.  The family has asked that we see her for evaluation of possible resection of the mass.    ROS: Review of Systems  Unable to perform ROS: Dementia    Family History  Problem Relation Age of Onset  . Heart attack Father   . Cancer Father   . Arthritis Father   . Suicidality Mother   . Depression Mother   . Heart attack Brother   . Heart disease Brother   . Pneumonia Brother   . Suicidality Son   . Hypertension Daughter   . Depression Other     Past Medical History:  Diagnosis Date  . Alzheimer disease ([place])   . Arthritis   . CAD (coronary artery disease)    Probable CAD, nuclear February, 2012, possible anteroseptal and inferoseptal ischemia but the study was technically difficult  . Cancer ([place])   . Chest pain    Hospital February, 2012, nuclear, possible anteroseptal and inferoseptal ischemia, technically difficult, medical therapy  . Dementia ([place])    Significant  . Diastolic dysfunction    Echo, February, 2012  . Diastolic dysfunction   . DNR (do not resuscitate)   . Dyslipidemia   . Edema    hospitalizations September, 2013, some fluid overload  . Ejection fraction    EF 55%, echo, February, 2012  //   EF 55-60%, echo, February 25, 2012 // Echo 12/17: EF 60-65, no RWMA, Gr 1 DD, normal RVSF  . Essential hypertension   . GI bleed    February, 2012 incomplete colonoscopy, hemorrhoids, needs complete colonoscopy  . 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 Hx: UTI (urinary tract infection)   . Hyperlipidemia   . Leg pain, bilateral   . Major depression    2 overdoses in 1975 & 1977  . Migraines    25 year  . Orthostatic hypotension   . Suicide attempt ([place]) 1985   2 attempts with Valium  . TIA (transient ischemic attack)       ???question of a TIA in February, 2012, carotid Doppler, August 11, 2010 no significant abnormalities    Past Surgical History:  Procedure  Laterality Date  . ABDOMINAL HYSTERECTOMY    . TONSILLECTOMY      Social History:  reports that she has never smoked. She has never used smokeless tobacco. She reports current drug use. She reports that she does not drink alcohol.  Allergies:  Allergies  Allergen Reactions  . Levaquin [Levofloxacin] Other (See Comments)    Altered mental status   . Rocephin [Ceftriaxone] Other (See Comments)    Uncontrollable chills/shaking, dizziness, and disorientation after receiving this (??)  . Statins Other (See Comments)    Severe myalgias    Medications Prior to Admission  Medication Sig Dispense Refill  . acetaminophen (TYLENOL) 500 MG tablet Take 1,000 mg by mouth every 6 (six) hours as needed for moderate pain.    . Ascorbic Acid (VITAMIN C) 1000 MG tablet Take 1,000 mg by mouth every  morning.     Marland Kitchen aspirin EC 81 MG tablet Take 81 mg by mouth every morning.     . Cholecalciferol (VITAMIN D-3) 1000 units CAPS Take 1,000 Units by mouth every morning.     . cyanocobalamin 500 MCG tablet Take 500 mcg by mouth every morning.     . Multiple Vitamin (MULTIVITAMIN WITH MINERALS) TABS tablet Take 1 tablet by mouth every morning.     . nitrofurantoin, macrocrystal-monohydrate, (MACROBID) 100 MG capsule Take 1 capsule (100 mg total) by mouth 2 (two) times daily. 14 capsule 0  . Omega-3 Fatty Acids (OMEGA-3 FISH OIL) 300 MG CAPS Take 2 capsules by mouth every morning.     Marland Kitchen OVER THE COUNTER MEDICATION Take 1 capsule by mouth 2 (two) times daily with a meal. "Fem Dophilus" for urinary tract health    . OVER THE COUNTER MEDICATION Take 5 mLs by mouth daily. D-Mannose powder - for urinary tract health - mix in 6 oz water and drink      Blood pressure 119/61, pulse 88, temperature 98.5 F (36.9 C), temperature source Oral, resp. rate (!) 26, height 5\' 3"  (1.6 m), weight 79.4 kg, SpO2 95 %. Physical Exam: Physical Exam Constitutional:      General: She is not in acute distress.    Appearance: She is  ill-appearing. She is not toxic-appearing or diaphoretic.     Comments: I was in the room on and off for better part of 30 to 40 minutes.  She opened her eyes once after I was in the room about 20 minutes.  She kept her eyes closed remainder of the time.  She was completely unresponsive.  Her caregivers in the room and she says she is fine during the day frequently and then gets confused at night.  I had the caregiver open the blinds and let more daylight in and not 5 minutes later she opened her eyes and started talking to the caregiver.  HENT:     Head: Normocephalic and atraumatic.     Mouth/Throat:     Mouth: Mucous membranes are moist.     Pharynx: Oropharynx is clear.  Eyes:     General: No scleral icterus.       Right eye: No discharge.        Left eye: No discharge.     Comments: Pupils are equal  Neck:     Musculoskeletal: No neck rigidity or muscular tenderness.     Vascular: No carotid bruit.     Comments: On and neck exam I could move her neck around she did not seem to be bothered she never opened her eyes. Cardiovascular:     Rate and Rhythm: Normal rate. Rhythm irregular.     Pulses: Normal pulses.     Heart sounds: Normal heart sounds.  Pulmonary:     Effort: Pulmonary effort is normal. No respiratory distress.     Breath sounds: Normal breath sounds. No stridor. No wheezing, rhonchi or rales.  Chest:     Chest wall: No tenderness.  Abdominal:     General: There is distension (Minimal).     Palpations: There is mass (She was really too tender trying to feel the mass ).     Tenderness: There is abdominal tenderness. There is no right CVA tenderness, left CVA tenderness or rebound.     Comments: Abdomen is minimally distended but she is extremely tender to palpation especially in the left lower quadrant.  Musculoskeletal:  Right lower leg: Edema present.     Left lower leg: Edema present.     Comments: She has some lower extremity edema both lower legs.   Lymphadenopathy:     Cervical: No cervical adenopathy.  Skin:    General: Skin is warm and dry.     Capillary Refill: Capillary refill takes less than 2 seconds.     Coloration: Skin is not jaundiced or pale.     Findings: No bruising, erythema, lesion or rash.  Neurological:     Cranial Nerves: No cranial nerve deficit.     Comments: Patient was somnolent for 90% of the time I was in the room and then she opened her eyes and started speaking  Psychiatric:     Comments: Patient has significant dementia and was somnolent for majority that time I was in the room.  She did awake after the shades were opened and did begin speaking to her caregiver.  Her speech was clear and coherent.     Results for orders placed or performed during the hospital encounter of 03/04/19 (from the past 48 hour(s))  Lactic acid, plasma     Status: Abnormal   Collection Time: 03/04/19  4:00 PM  Result Value Ref Range   Lactic Acid, Venous 2.5 (HH) 0.5 - 1.9 mmol/L    Comment: CRITICAL RESULT CALLED TO, READ BACK BY AND VERIFIED WITH: Johnnette Gourd RN @ [phone] ON 03/04/2019 BY TEMOCHE, H Performed at [facility], [address] [place], [place] [address]   Comprehensive metabolic panel     Status: Abnormal   Collection Time: 03/04/19  4:02 PM  Result Value Ref Range   Sodium 128 (L) 135 - 145 mmol/L   Potassium 3.8 3.5 - 5.1 mmol/L   Chloride 95 (L) 98 - 111 mmol/L   CO2 22 22 - 32 mmol/L   Glucose, Bld 148 (H) 70 - 99 mg/dL   BUN 15 8 - 23 mg/dL   Creatinine, Ser 0.93 0.44 - 1.00 mg/dL   Calcium 8.8 (L) 8.9 - 10.3 mg/dL   Total Protein 5.7 (L) 6.5 - 8.1 g/dL   Albumin 3.0 (L) 3.5 - 5.0 g/dL   AST 29 15 - 41 U/L   ALT 34 0 - 44 U/L   Alkaline Phosphatase 75 38 - 126 U/L   Total Bilirubin 1.6 (H) 0.3 - 1.2 mg/dL   GFR calc non Af Amer 55 (L) >60 mL/min   GFR calc Af Amer >60 >60 mL/min   Anion gap 11 5 - 15    Comment: Performed at [facility], [place] [address], [place], [place] [address]   CBC WITH DIFFERENTIAL     Status: Abnormal   Collection Time: 03/04/19  4:02 PM  Result Value Ref Range   WBC 5.7 4.0 - 10.5 K/uL   RBC 4.75 3.87 - 5.11 MIL/uL   Hemoglobin 14.9 12.0 - 15.0 g/dL   HCT 42.3 36.0 - 46.0 %   MCV 89.1 80.0 - 100.0 fL   MCH 31.4 26.0 - 34.0 pg   MCHC 35.2 30.0 - 36.0 g/dL   RDW 12.5 11.5 - 15.5 %   Platelets 130 (L) 150 - 400 K/uL   nRBC 0.0 0.0 - 0.2 %   Neutrophils Relative % 88 %   Neutro Abs 5.0 1.7 - 7.7 K/uL   Lymphocytes Relative 6 %   Lymphs Abs 0.3 (L) 0.7 - 4.0 K/uL   Monocytes Relative 6 %  Monocytes Absolute 0.3 0.1 - 1.0 K/uL   Eosinophils Relative 0 %   Eosinophils Absolute 0.0 0.0 - 0.5 K/uL   Basophils Relative 0 %   Basophils Absolute 0.0 0.0 - 0.1 K/uL   WBC Morphology See Note     Comment: Dohle Bodies Vaculated Neutrophils    Immature Granulocytes 0 %   Abs Immature Granulocytes 0.02 0.00 - 0.07 K/uL    Comment: Performed at [facility] [address] [place], [place] [address]  APTT     Status: None   Collection Time: 03/04/19  4:02 PM  Result Value Ref Range   aPTT 29 24 - 36 seconds    Comment: Performed at [facility] [address] [place], [place] [address]  Protime-INR     Status: None   Collection Time: 03/04/19  4:02 PM  Result Value Ref Range   Prothrombin Time 14.1 11.4 - 15.2 seconds   INR 1.1 0.8 - 1.2    Comment: (NOTE) INR goal varies based on device and disease states. Performed at [facility], [place] [address], [place], [place] [address]   SARS Coronavirus 2 [facility] order, Performed in [facility] hospital lab) Nasopharyngeal Nasopharyngeal Swab     Status: None   Collection Time: 03/04/19  4:39 PM   Specimen: Nasopharyngeal Swab  Result Value Ref Range   SARS Coronavirus 2 NEGATIVE NEGATIVE    Comment: (NOTE) If result is NEGATIVE SARS-CoV-2 target nucleic acids are NOT DETECTED. The SARS-CoV-2 RNA is generally detectable in upper and lower  respiratory specimens during  the acute phase of infection. The lowest  concentration of SARS-CoV-2 viral copies this assay can detect is 250  copies / mL. A negative result does not preclude SARS-CoV-2 infection  and should not be used as the sole basis for treatment or other  patient management decisions.  A negative result may occur with  improper specimen collection / handling, submission of specimen other  than nasopharyngeal swab, presence of viral mutation(s) within the  areas targeted by this assay, and inadequate number of viral copies  (<250 copies / mL). A negative result must be combined with clinical  observations, patient history, and epidemiological information. If result is POSITIVE SARS-CoV-2 target nucleic acids are DETECTED. The SARS-CoV-2 RNA is generally detectable in upper and lower  respiratory specimens dur ing the acute phase of infection.  Positive  results are indicative of active infection with SARS-CoV-2.  Clinical  correlation with patient history and other diagnostic information is  necessary to determine patient infection status.  Positive results do  not rule out bacterial infection or co-infection with other viruses. If result is PRESUMPTIVE POSTIVE SARS-CoV-2 nucleic acids MAY BE PRESENT.   A presumptive positive result was obtained on the submitted specimen  and confirmed on repeat testing.  While 2019 novel coronavirus  (SARS-CoV-2) nucleic acids may be present in the submitted sample  additional confirmatory testing may be necessary for epidemiological  and / or clinical management purposes  to differentiate between  SARS-CoV-2 and other Sarbecovirus currently known to infect humans.  If clinically indicated additional testing with an alternate test  methodology [phone]) is advised. The SARS-CoV-2 RNA is generally  detectable in upper and lower respiratory sp ecimens during the acute  phase of infection. The expected result is Negative. Fact Sheet for Patients:   StrictlyIdeas.no Fact Sheet for Healthcare Providers: BankingDealers.co.za This test is not yet approved or cleared by the [place] FDA and  has been authorized for detection and/or diagnosis of SARS-CoV-2 by FDA under an Emergency Use Authorization (EUA).  This EUA will remain in effect (meaning this test can be used) for the duration of the COVID-19 declaration under Section 564(b)(1) of the Act, 21 U.S.C. section 360bbb-3(b)(1), unless the authorization is terminated or revoked sooner. Performed at [facility], [place] [address], [place], [place] [address]   Urinalysis, Routine w reflex microscopic     Status: Abnormal   Collection Time: 03/04/19  5:40 PM  Result Value Ref Range   Color, Urine AMBER (A) YELLOW    Comment: BIOCHEMICALS MAY BE AFFECTED BY COLOR   APPearance HAZY (A) CLEAR   Specific Gravity, Urine 1.012 1.005 - 1.030   pH 5.0 5.0 - 8.0   Glucose, UA NEGATIVE NEGATIVE mg/dL   Hgb urine dipstick NEGATIVE NEGATIVE   Bilirubin Urine NEGATIVE NEGATIVE   Ketones, ur NEGATIVE NEGATIVE mg/dL   Protein, ur 30 (A) NEGATIVE mg/dL   Nitrite NEGATIVE NEGATIVE   Leukocytes,Ua NEGATIVE NEGATIVE   RBC / HPF 0-5 0 - 5 RBC/hpf   WBC, UA 0-5 0 - 5 WBC/hpf   Bacteria, UA NONE SEEN NONE SEEN   Mucus PRESENT    Amorphous Crystal PRESENT     Comment: Performed at [facility], [place] [address] [place], [place] [address]  Lactic acid, plasma     Status: Abnormal   Collection Time: 03/04/19  6:26 PM  Result Value Ref Range   Lactic Acid, Venous 2.4 (HH) 0.5 - 1.9 mmol/L    Comment: CRITICAL VALUE NOTED.  VALUE IS CONSISTENT WITH PREVIOUSLY REPORTED AND CALLED VALUE. Performed at [facility], [place] [address], [place], [place] [address]    Ct Abdomen Pelvis Wo Contrast  Result Date: 03/04/2019 CLINICAL DATA:  85 year old female with abdominal and pelvic pain with fever. Known mesenteric mass. EXAM: CT ABDOMEN AND  PELVIS WITHOUT CONTRAST TECHNIQUE: Multidetector CT imaging of the abdomen and pelvis was performed following the standard protocol without IV contrast. COMPARISON:  02/27/2019 CT and prior studies FINDINGS: Please note that parenchymal abnormalities may be missed without intravenous contrast. Lower chest: Trace bilateral pleural effusions and bibasilar opacities/atelectasis noted. Cardiomegaly is present. Hepatobiliary: The liver and gallbladder are unremarkable. No biliary dilatation. Pancreas: Unremarkable Spleen: Unremarkable Adrenals/Urinary Tract: The kidneys, adrenal glands and bladder are unremarkable. Stomach/Bowel: The previously identified abdominal mass measures 6.5 x 10 cm and now contains gas. Moderate inflammation and foci of pneumoperitoneum are noted adjacent to the mass. There are mildly distended loops of small bowel within the LEFT abdomen. No other bowel abnormalities are identified. Vascular/Lymphatic: Aortic atherosclerosis. No enlarged abdominal or pelvic lymph nodes. Reproductive: Status post hysterectomy. No adnexal masses. Other: No ascites or focal collection. Musculoskeletal: No acute or suspicious bony abnormalities identified. IMPRESSION: 1. Large abdominal mass again noted, but now containing gas with moderate adjacent inflammation and foci of free air. This may represent necrosis of this mass with gas formation, superimposed infection or involvement of bowel with perforation. No evidence of abscess. 2. Dilated loops of small bowel within the LEFT abdomen which may represent a focal ileus or developing small bowel obstruction. 3. Trace bilateral pleural effusions with bibasilar opacities/atelectasis. 4.  Aortic Atherosclerosis (ICD10-I70.0). Electronically Signed   By: Margarette Canada M.D.   On: 03/04/2019 17:35   Ct Head Wo Contrast  Result Date: 03/04/2019 CLINICAL DATA:  85 year old female with altered level of consciousness. EXAM: CT HEAD WITHOUT CONTRAST TECHNIQUE: Contiguous  axial images were obtained from the base of the skull through the vertex without intravenous contrast. COMPARISON:  03/02/2019 CT and prior studies FINDINGS: Brain: No evidence of acute infarction, hemorrhage, hydrocephalus, extra-axial collection or mass lesion/mass effect. Atrophy and chronic small-vessel white matter ischemic changes again noted. Vascular: Atherosclerotic calcifications noted. Skull: Normal. Negative for fracture or focal lesion. Sinuses/Orbits: No acute finding. Other: None. IMPRESSION: 1. No evidence of acute intracranial abnormality. 2. Atrophy and chronic small-vessel white matter ischemic changes. Electronically Signed   By: Margarette Canada M.D.   On: 03/04/2019 17:20   Dg Chest Port 1 View  Result Date: 03/04/2019 CLINICAL DATA:  Patient with weakness. EXAM: PORTABLE CHEST 1 VIEW COMPARISON:  Chest radiograph 02/07/2019 FINDINGS: Monitoring leads overlie the patient. Stable cardiomegaly. Low lung volumes. Minimal bibasilar heterogeneous opacities. No pleural effusion or pneumothorax. Thoracic spine degenerative changes. IMPRESSION: Low lung volumes with bibasilar opacities favored to represent atelectasis. Infection not excluded Electronically Signed   By: Lovey Newcomer M.D.   On: 03/04/2019 16:27   . sodium chloride 75 mL/hr at 03/05/19 1002  . ceFEPime (MAXIPIME) IV 2 g (03/05/19 0615)  . [START ON 03/06/2019] vancomycin        Assessment/Plan Acute encephalopathy Hx of dementia Hx AF -not on anticoagulation Hx CAD Hx depression/anxiety Recent Rx UTI  Enlarging 6.5 x 10 cm mass (GIST vs dermoid tumor) with moderate inflammation/foci of free air  - Perforation vs necrosis of the tumor Partial small bowel obstruction vs ileus Possible sepsis  FEN: IV fluids/n.p.o. ID: Flagyl x1 dose 9/13; Maxipime 9/13 >> day 2   DVT: SCDs Follow-up: To be determined  Plan: I will review the CT with Dr. Lucia Gaskins.  In view of enlarging tumor, new foci of free air, and based on the exam  she has at least a partial small bowel obstruction and a possible perforation.  She does not appear to be a good candidate for resection.    Dr. Barry Dienes was uncertain it could be resected 01/2018 and recommended against any emergent surgery in the future.  I have been talking with 1 of the daughters on the phone, and I have recommended Palliative evaluation at this point.  Patient was apparently on hospice in the past and they discontinued it because of issues of her urinary tract infection that Hospice was not willing to treat.    She has apparently improved on antibiotics since admission.  I will continue her on a broad-spectrum antibiotic at this point, and see how she does.  Dr. Lucia Gaskins will see her shortly and discuss with the family also.  [facility] Surgery 03/05/2019, 12:00 PM Pager: [phone] Consults: [phone]  Agree with above.  Romualdo Bolk, her care giver of 5+ years is at the bedside. Langley Gauss got her daughter, Theda Belfast, who lives in [place], on the phone.  Ms. Truby lives in [place] with her other daughter, Rolm Bookbinder.  Ms. Benjamine Mola is apparently in the car traveling.  Ms. Garrett was seen by my partner, Dr. Barry Dienes, in August 2019 and considered not a candidate for surgery at that time.  Dr. Barry Dienes even addressed non operative management in an emergency.  Ms. Debbrah Alar was somewhat focused on Ms. Brienza's visit to the [facility] ER on 9/11.  All that being said - Ms. Hamidi has an abdominal mass with evidence of inflammation and foci of free air.  Her prognosis is very poor and emergency surgery will not change that prognosis.  I agree with the  family's plan for palliative care.  They apparently had a poor experience with Hospice before, though I do not know the details.  That Ms. Heidler was on Hospice and got off says something.  We will sign off.  Let me know if I may be of any further assistance.  Alphonsa Overall, MD, [facility]  Surgery Pager: [phone] Office phone:  [phone]

## 2019-03-05 NOTE — Progress Notes (Signed)
Patient MEWS score is now a Red. MD notify will continue to monitor. Patient is comfort care.

## 2019-03-05 NOTE — Progress Notes (Signed)
Spoke with Dr. Hal Hope earlier re: morphine drip. Patient's other daughter coming this am from out of state (per patient's personal caregiver). Patient shows no sign of being in any discomfort. Will delay start of Morphine drip till patient becomes restless or shows sign of discomfort to allow daughter to visit with patient.

## 2019-03-05 NOTE — Plan of Care (Signed)
  Problem: Safety: Goal: Ability to remain free from injury will improve Outcome: Progressing   

## 2019-03-05 NOTE — Telephone Encounter (Signed)
Spoke to daughter no need patient is in hospital.

## 2019-03-05 NOTE — Progress Notes (Signed)
PHARMACY - PHYSICIAN COMMUNICATION CRITICAL VALUE ALERT - BLOOD CULTURE IDENTIFICATION (BCID)  Valerie Chen is an 85 y.o. female who presented to [facility] on 03/04/2019 with a chief complaint of nausea, vomiting, and AMS.   Assessment:  Blood cultures 1/4 bottles positive for E. Coli with no resistance gene.  Name of physician (or Provider) Contacted: Dr. Tyrell Antonio  Current antibiotics: Vancomycin and cefepime  Changes to prescribed antibiotics recommended:  Discontinue the vancomycin and continue cefepime  Results for orders placed or performed during the hospital encounter of 03/04/19  Blood Culture ID Panel (Reflexed) (Collected: 03/04/2019  4:02 PM)  Result Value Ref Range   Enterococcus species NOT DETECTED NOT DETECTED   Listeria monocytogenes NOT DETECTED NOT DETECTED   Staphylococcus species NOT DETECTED NOT DETECTED   Staphylococcus aureus (BCID) NOT DETECTED NOT DETECTED   Streptococcus species NOT DETECTED NOT DETECTED   Streptococcus agalactiae NOT DETECTED NOT DETECTED   Streptococcus pneumoniae NOT DETECTED NOT DETECTED   Streptococcus pyogenes NOT DETECTED NOT DETECTED   Acinetobacter baumannii NOT DETECTED NOT DETECTED   Enterobacteriaceae species DETECTED (A) NOT DETECTED   Enterobacter cloacae complex NOT DETECTED NOT DETECTED   Escherichia coli DETECTED (A) NOT DETECTED   Klebsiella oxytoca NOT DETECTED NOT DETECTED   Klebsiella pneumoniae NOT DETECTED NOT DETECTED   Proteus species NOT DETECTED NOT DETECTED   Serratia marcescens NOT DETECTED NOT DETECTED   Carbapenem resistance NOT DETECTED NOT DETECTED   Haemophilus influenzae NOT DETECTED NOT DETECTED   Neisseria meningitidis NOT DETECTED NOT DETECTED   Pseudomonas aeruginosa NOT DETECTED NOT DETECTED   Candida albicans NOT DETECTED NOT DETECTED   Candida glabrata NOT DETECTED NOT DETECTED   Candida krusei NOT DETECTED NOT DETECTED   Candida parapsilosis NOT DETECTED NOT DETECTED   Candida tropicalis  NOT DETECTED NOT DETECTED    Wallene Huh 03/05/2019  4:31 PM

## 2019-03-05 NOTE — Telephone Encounter (Signed)
See note

## 2019-03-05 NOTE — Consult Note (Addendum)
[place]  [phone] Fax:([phone]) [place]  Referral MD: Dr. Niel Hummer  Reason for Referral: GIST  HPI: The patient was admitted secondary to nausea, vomiting, and AMS. Has had increasing weakness over past 2 days. Family reported fever up to 101 at home. In the ER patient is encephalopathic with minimal labs showing sodium is 128 creatinine 0.93 albumin 3 lactate 2.5 hemoglobin 14.9 WBC 5.7.  CT abdomen pelvis done shows possible necrosis of the tumor with possible perforation and also small bowel obstruction. Family had requested no surgical intervention and for comfort measures. Morphine drip has been ordered. Last visit with Medical Oncology was 01/12/2018. She had a left mesenteric mass - likely GIST. Was offered Gleevec, but family decided at that time that they did not want chemotherapy. Was seen by surgery in 2019 and surgery not recommended.   The patient has been in her usual state of health up until about 2 days ago. Had been eating and drinking well. Was not losing weight. Was seen in the ER last week and diagnosed with a UTI. Was placed on antibiotics. Developed abdominal pain, fever, and projectile vomiting. Has also been having what look like TIA symptoms that come and go. Today, her caregiver is at the bedside. Daughter, Kieth Brightly, available by phone. Patient confused and unable to provide ROS. Medical Oncology asked to see the patient regarding her abdominal mass.     Past Medical History:  Diagnosis Date  . Alzheimer disease ([place])   . Arthritis   . CAD (coronary artery disease)    Probable CAD, nuclear February, 2012, possible anteroseptal and inferoseptal ischemia but the study was technically difficult  . Cancer ([place])   . Chest pain    Hospital February, 2012, nuclear, possible anteroseptal and inferoseptal ischemia, technically difficult, medical therapy  . Dementia ([place])    Significant  . Diastolic  dysfunction    Echo, February, 2012  . Diastolic dysfunction   . DNR (do not resuscitate)   . Dyslipidemia   . Edema    hospitalizations September, 2013, some fluid overload  . Ejection fraction    EF 55%, echo, February, 2012  //   EF 55-60%, echo, February 25, 2012 // Echo 12/17: EF 60-65, no RWMA, Gr 1 DD, normal RVSF  . Essential hypertension   . GI bleed    February, 2012 incomplete colonoscopy, hemorrhoids, needs complete colonoscopy  . 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 Hx: UTI (urinary tract infection)   . Hyperlipidemia   . Leg pain, bilateral   . Major depression    2 overdoses in 1975 & 1977  . Migraines    25 year  . Orthostatic hypotension   . Suicide attempt ([place]) 1985   2 attempts with Valium  . TIA (transient ischemic attack)       ???question of a TIA in February, 2012, carotid Doppler, August 11, 2010 no significant abnormalities  :  Past Surgical History:  Procedure Laterality Date  . ABDOMINAL HYSTERECTOMY    . TONSILLECTOMY    :  Current Facility-Administered Medications  Medication Dose Route Frequency Provider Last Rate Last Dose  . 0.9 %  sodium chloride infusion   Intravenous Continuous Regalado, Belkys A, MD 75 mL/hr at 03/05/19 1002    . acetaminophen (TYLENOL) tablet 650 mg  650 mg Oral Q6H PRN [name], MD       Or  . acetaminophen (TYLENOL)  suppository 650 mg  650 mg Rectal Q6H PRN [name], MD      . ceFEPIme (MAXIPIME) 2 g in sodium chloride 0.9 % 100 mL IVPB  2 g Intravenous Q12H [name], MD 200 mL/hr at 03/05/19 0615 2 g at 03/05/19 0615  . fentaNYL (SUBLIMAZE) injection 25-50 mcg  25-50 mcg Intravenous Q2H PRN Regalado, Belkys A, MD   50 mcg at 03/05/19 1221  . ondansetron (ZOFRAN) tablet 4 mg  4 mg Oral Q6H PRN [name], MD       Or  . ondansetron [facility]) injection 4 mg  4 mg Intravenous Q6H PRN [name], MD      . Derrill Memo ON  03/06/2019] vancomycin (VANCOCIN) 1,750 mg in sodium chloride 0.9 % 500 mL IVPB  1,750 mg Intravenous Q48H [name], MD         Allergies  Allergen Reactions  . Levaquin [Levofloxacin] Other (See Comments)    Altered mental status   . Rocephin [Ceftriaxone] Other (See Comments)    Uncontrollable chills/shaking, dizziness, and disorientation after receiving this (??)  . Statins Other (See Comments)    Severe myalgias  :  Family History  Problem Relation Age of Onset  . Heart attack Father   . Cancer Father   . Arthritis Father   . Suicidality Mother   . Depression Mother   . Heart attack Brother   . Heart disease Brother   . Pneumonia Brother   . Suicidality Son   . Hypertension Daughter   . Depression Other   :  Social History   Socioeconomic History  . Marital status: Widowed    Spouse name: Not on file  . Number of children: 3  . Years of education: HS  . Highest education level: Not on file  Occupational History  . Occupation: RETIRED  Social Needs  . Financial resource strain: Not on file  . Food insecurity    Worry: Never true    Inability: Never true  . Transportation needs    Medical: No    Non-medical: Not on file  Tobacco Use  . Smoking status: Never Smoker  . Smokeless tobacco: Never Used  Substance and Sexual Activity  . Alcohol use: No  . Drug use: Yes    Comment: hx of valium in 1980's - had 2 overdoses per daughter  . Sexual activity: Never    Birth control/protection: None  Lifestyle  . Physical activity    Days per week: 3 days    Minutes per session: 30 min  . Stress: Only a little  Relationships  . Social connections    Talks on phone: More than three times a week    Gets together: Three times a week    Attends religious service: 1 to 4 times per year    Active member of club or organization: No    Attends meetings of clubs or organizations: Never    Relationship status: Widowed  . Intimate partner violence    Fear of  current or ex partner: Not on file    Emotionally abused: Not on file    Physically abused: Not on file    Forced sexual activity: Not on file  Other Topics Concern  . Not on file  Social History Narrative   Has 24/7 caregiver      2 husbands   3 children      Diet: Heart Health   Current/Past profession- Retired Engineer, manufacturing systems  Exercise- Yes, walking   Living Will-Yes   DNR- Yes   POA/HPOA- Yes   Right-handed  :  Review of Systems: Unable to obtain secondary to patient condition.   Exam: Patient Vitals for the past 24 hrs:  BP Temp Temp src Pulse Resp SpO2 Height Weight  03/05/19 0830 119/61 98.5 F (36.9 C) Oral 88 (!) 26 95 % - -  03/05/19 0249 - - - - - - - 175 lb (79.4 kg)  03/04/19 2336 104/89 97.9 F (36.6 C) Oral 82 18 96 % - -  03/04/19 2245 90/63 - - 91 (!) 24 96 % - -  03/04/19 2230 (!) 102/54 - - 88 (!) 23 96 % - -  03/04/19 2145 (!) 102/58 - - 89 18 95 % - -  03/04/19 2130 106/61 - - 93 (!) 21 94 % - -  03/04/19 2000 108/71 - - (!) 139 18 92 % - -  03/04/19 1851 (!) 122/57 - - 96 18 96 % - -  03/04/19 1630 124/80 - - 99 17 95 % - -  03/04/19 1615 129/75 - - 92 18 94 % - -  03/04/19 1600 136/70 - - 90 18 95 % - -  03/04/19 1536 - - - - - - 5\' 3"  (1.6 m) 167 lb (75.8 kg)  03/04/19 1533 124/70 97.7 F (36.5 C) Oral 95 19 95 % - -  03/04/19 1531 - - - - - 95 % - -    General:  Elderly female, no distress.   Eyes:  no scleral icterus.   ENT: Neck was without thyromegaly.   Lymphatics:  Negative cervical, supraclavicular or axillary adenopathy.   Respiratory: lungs were clear bilaterally without wheezing or crackles.   Cardiovascular:  Regular rate and rhythm, S1/S2, without murmur, rub or gallop.  There was no pedal edema.   GI:  Palpable mass. Pain with palpation.   Skin exam was without echymosis, petichae.   Neuro exam was nonfocal.  Patient was alert. Did not participate in our conversation.   Lab Results  Component Value Date   WBC 5.7 03/04/2019    HGB 14.9 03/04/2019   HCT 42.3 03/04/2019   PLT 130 (L) 03/04/2019   GLUCOSE 148 (H) 03/04/2019   CHOL 179 11/01/2014   TRIG 96 11/01/2014   HDL 72 11/01/2014   LDLCALC 88 11/01/2014   ALT 34 03/04/2019   AST 29 03/04/2019   NA 128 (L) 03/04/2019   K 3.8 03/04/2019   CL 95 (L) 03/04/2019   CREATININE 0.93 03/04/2019   BUN 15 03/04/2019   CO2 22 03/04/2019    Ct Abdomen Pelvis Wo Contrast  Result Date: 03/04/2019 CLINICAL DATA:  85 year old female with abdominal and pelvic pain with fever. Known mesenteric mass. EXAM: CT ABDOMEN AND PELVIS WITHOUT CONTRAST TECHNIQUE: Multidetector CT imaging of the abdomen and pelvis was performed following the standard protocol without IV contrast. COMPARISON:  02/27/2019 CT and prior studies FINDINGS: Please note that parenchymal abnormalities may be missed without intravenous contrast. Lower chest: Trace bilateral pleural effusions and bibasilar opacities/atelectasis noted. Cardiomegaly is present. Hepatobiliary: The liver and gallbladder are unremarkable. No biliary dilatation. Pancreas: Unremarkable Spleen: Unremarkable Adrenals/Urinary Tract: The kidneys, adrenal glands and bladder are unremarkable. Stomach/Bowel: The previously identified abdominal mass measures 6.5 x 10 cm and now contains gas. Moderate inflammation and foci of pneumoperitoneum are noted adjacent to the mass. There are mildly distended loops of small bowel within the LEFT abdomen. No other bowel  abnormalities are identified. Vascular/Lymphatic: Aortic atherosclerosis. No enlarged abdominal or pelvic lymph nodes. Reproductive: Status post hysterectomy. No adnexal masses. Other: No ascites or focal collection. Musculoskeletal: No acute or suspicious bony abnormalities identified. IMPRESSION: 1. Large abdominal mass again noted, but now containing gas with moderate adjacent inflammation and foci of free air. This may represent necrosis of this mass with gas formation, superimposed infection  or involvement of bowel with perforation. No evidence of abscess. 2. Dilated loops of small bowel within the LEFT abdomen which may represent a focal ileus or developing small bowel obstruction. 3. Trace bilateral pleural effusions with bibasilar opacities/atelectasis. 4.  Aortic Atherosclerosis (ICD10-I70.0). Electronically Signed   By: Margarette Canada M.D.   On: 03/04/2019 17:35   Ct Head Wo Contrast  Result Date: 03/04/2019 CLINICAL DATA:  86 year old female with altered level of consciousness. EXAM: CT HEAD WITHOUT CONTRAST TECHNIQUE: Contiguous axial images were obtained from the base of the skull through the vertex without intravenous contrast. COMPARISON:  03/02/2019 CT and prior studies FINDINGS: Brain: No evidence of acute infarction, hemorrhage, hydrocephalus, extra-axial collection or mass lesion/mass effect. Atrophy and chronic small-vessel white matter ischemic changes again noted. Vascular: Atherosclerotic calcifications noted. Skull: Normal. Negative for fracture or focal lesion. Sinuses/Orbits: No acute finding. Other: None. IMPRESSION: 1. No evidence of acute intracranial abnormality. 2. Atrophy and chronic small-vessel white matter ischemic changes. Electronically Signed   By: Margarette Canada M.D.   On: 03/04/2019 17:20   Ct Head Wo Contrast  Result Date: 03/02/2019 CLINICAL DATA:  Altered level of consciousness EXAM: CT HEAD WITHOUT CONTRAST TECHNIQUE: Contiguous axial images were obtained from the base of the skull through the vertex without intravenous contrast. COMPARISON:  02/07/2019 FINDINGS: Brain: Chronic atrophic changes and chronic white matter ischemic changes are again noted and stable. No findings to suggest acute hemorrhage, acute infarction or space-occupying mass lesion are seen. Vascular: No hyperdense vessel or unexpected calcification. Skull: Normal. Negative for fracture or focal lesion. Sinuses/Orbits: No acute finding. Other: None. IMPRESSION: Chronic atrophic and ischemic  changes stable from the prior exam. No acute abnormality noted. Electronically Signed   By: Inez Catalina M.D.   On: 03/02/2019 12:24   Ct Head Wo Contrast  Result Date: 02/07/2019 CLINICAL DATA:  85 year old with acute onset of ataxia and abnormal gait that began at approximately 8 o'clock a.m. this morning, requiring assistance with walking. Patient is usually able to walk without assistance. Current history of dementia. EXAM: CT HEAD WITHOUT CONTRAST TECHNIQUE: Contiguous axial images were obtained from the base of the skull through the vertex without intravenous contrast. COMPARISON:  04/02/2018 and earlier. FINDINGS: Brain: Moderate cortical, deep and cerebellar atrophy, unchanged. Severe changes of small vessel disease of the white matter diffusely, unchanged. Asymmetric LATERAL ventricles, LEFT greater than RIGHT, unchanged and felt to be developmental in origin. No mass lesion. No midline shift. No acute hemorrhage or hematoma. No extra-axial fluid collections. No evidence of acute infarction. Vascular: Mild BILATERAL carotid siphon and BILATERAL vertebral artery atherosclerosis. No hyperdense vessel. Skull: No skull fracture or other focal osseous abnormality involving the skull. Sinuses/Orbits: Very small, insignificant mucous retention cyst or polyp involving the LEFT frontal sinus. Remaining visualized paranasal sinuses well aerated. BILATERAL mastoid air cells and BILATERAL middle ear cavities well-aerated. Visualized orbits and globes unremarkable. Other: None. IMPRESSION: 1. No acute intracranial abnormality. 2. Stable moderate age related generalized atrophy and severe chronic microvascular ischemic changes of the white matter. Electronically Signed   By: Sherran Needs.D.  On: 02/07/2019 13:33   Ct Abdomen Pelvis W Contrast  Result Date: 02/27/2019 CLINICAL DATA:  GI stromal tumor.  Abdominal mass. EXAM: CT ABDOMEN AND PELVIS WITH CONTRAST TECHNIQUE: Multidetector CT imaging of the  abdomen and pelvis was performed using the standard protocol following bolus administration of intravenous contrast. CONTRAST:  166mL ISOVUE-300 IOPAMIDOL (ISOVUE-300) INJECTION 61% COMPARISON:  01/10/2018 FINDINGS: Lower chest: Lung parenchyma in the lower hemithorax obscured by respiratory motion. No substantial pleural effusion. Hepatobiliary: No suspicious focal abnormality within the liver parenchyma. There is no evidence for gallstones, gallbladder wall thickening, or pericholecystic fluid. No intrahepatic or extrahepatic biliary dilation. Pancreas: No focal mass lesion. No dilatation of the main duct. No intraparenchymal cyst. No peripancreatic edema. Spleen: No splenomegaly. No focal mass lesion. Adrenals/Urinary Tract: No adrenal nodule or mass. Kidneys unremarkable. No evidence for hydroureter. The urinary bladder appears normal for the degree of distention. Stomach/Bowel: Stomach is unremarkable. No gastric wall thickening. No evidence of outlet obstruction. Duodenum is normally positioned as is the ligament of Treitz. No small bowel wall thickening. No small bowel dilatation. The terminal ileum is normal. The appendix is not visualized, but there is no edema or inflammation in the region of the cecum. No gross colonic mass. No colonic wall thickening. Vascular/Lymphatic: There is abdominal aortic atherosclerosis without aneurysm. There is no gastrohepatic or hepatoduodenal ligament lymphadenopathy. No intraperitoneal or retroperitoneal lymphadenopathy. No pelvic sidewall lymphadenopathy. Reproductive: The uterus is surgically absent. There is no adnexal mass. Other: No intraperitoneal free fluid. Mobile mesenteric mass in the pelvis again identified, position to the left of midline on today's exam (59/2). Lesion measures 8.7 x 4.7 cm today compared to 7.9 x 4.7 cm previously, suggesting no substantial interval change. Musculoskeletal: No worrisome lytic or sclerotic osseous abnormality. IMPRESSION: 1.  Minimal interval enlargement of the mobile mesenteric soft tissue mass measuring 8.7 x 4.7 cm today compared to 7.9 x 4.7 cm previously. 2. Otherwise stable exam. No evidence for metastatic disease or intraperitoneal free fluid. Electronically Signed   By: Misty Stanley M.D.   On: 02/27/2019 10:36   Dg Chest Port 1 View  Result Date: 03/04/2019 CLINICAL DATA:  Patient with weakness. EXAM: PORTABLE CHEST 1 VIEW COMPARISON:  Chest radiograph 02/07/2019 FINDINGS: Monitoring leads overlie the patient. Stable cardiomegaly. Low lung volumes. Minimal bibasilar heterogeneous opacities. No pleural effusion or pneumothorax. Thoracic spine degenerative changes. IMPRESSION: Low lung volumes with bibasilar opacities favored to represent atelectasis. Infection not excluded Electronically Signed   By: Lovey Newcomer M.D.   On: 03/04/2019 16:27   Dg Chest Port 1 View  Result Date: 02/07/2019 CLINICAL DATA:  Cough. EXAM: PORTABLE CHEST 1 VIEW COMPARISON:  Radiographs of June 16, 2018. FINDINGS: Stable cardiomediastinal silhouette. No pneumothorax or pleural effusion is noted. Atherosclerosis of thoracic aorta is noted. Both lungs are clear. The visualized skeletal structures are unremarkable. IMPRESSION: No active disease. Aortic Atherosclerosis (ICD10-I70.0). Electronically Signed   By: Marijo Conception M.D.   On: 02/07/2019 13:29   Dg Hip Unilat With Pelvis 2-3 Views Right  Result Date: 03/02/2019 CLINICAL DATA:  Fall. EXAM: DG HIP (WITH OR WITHOUT PELVIS) 2-3V RIGHT COMPARISON:  CT abdomen pelvis dated February 27, 2019. FINDINGS: No acute fracture or dislocation. Unchanged mild right hip joint space narrowing and chondrocalcinosis. Similar appearing degenerative changes of the pubic symphysis. Osteopenia. Soft tissues are unremarkable. IMPRESSION: 1.  No acute osseous abnormality. Electronically Signed   By: Titus Dubin M.D.   On: 03/02/2019 12:21    Ct  Abdomen Pelvis Wo Contrast  Result Date:  03/04/2019 CLINICAL DATA:  85 year old female with abdominal and pelvic pain with fever. Known mesenteric mass. EXAM: CT ABDOMEN AND PELVIS WITHOUT CONTRAST TECHNIQUE: Multidetector CT imaging of the abdomen and pelvis was performed following the standard protocol without IV contrast. COMPARISON:  02/27/2019 CT and prior studies FINDINGS: Please note that parenchymal abnormalities may be missed without intravenous contrast. Lower chest: Trace bilateral pleural effusions and bibasilar opacities/atelectasis noted. Cardiomegaly is present. Hepatobiliary: The liver and gallbladder are unremarkable. No biliary dilatation. Pancreas: Unremarkable Spleen: Unremarkable Adrenals/Urinary Tract: The kidneys, adrenal glands and bladder are unremarkable. Stomach/Bowel: The previously identified abdominal mass measures 6.5 x 10 cm and now contains gas. Moderate inflammation and foci of pneumoperitoneum are noted adjacent to the mass. There are mildly distended loops of small bowel within the LEFT abdomen. No other bowel abnormalities are identified. Vascular/Lymphatic: Aortic atherosclerosis. No enlarged abdominal or pelvic lymph nodes. Reproductive: Status post hysterectomy. No adnexal masses. Other: No ascites or focal collection. Musculoskeletal: No acute or suspicious bony abnormalities identified. IMPRESSION: 1. Large abdominal mass again noted, but now containing gas with moderate adjacent inflammation and foci of free air. This may represent necrosis of this mass with gas formation, superimposed infection or involvement of bowel with perforation. No evidence of abscess. 2. Dilated loops of small bowel within the LEFT abdomen which may represent a focal ileus or developing small bowel obstruction. 3. Trace bilateral pleural effusions with bibasilar opacities/atelectasis. 4.  Aortic Atherosclerosis (ICD10-I70.0). Electronically Signed   By: Margarette Canada M.D.   On: 03/04/2019 17:35   Ct Head Wo Contrast  Result Date:  03/04/2019 CLINICAL DATA:  85 year old female with altered level of consciousness. EXAM: CT HEAD WITHOUT CONTRAST TECHNIQUE: Contiguous axial images were obtained from the base of the skull through the vertex without intravenous contrast. COMPARISON:  03/02/2019 CT and prior studies FINDINGS: Brain: No evidence of acute infarction, hemorrhage, hydrocephalus, extra-axial collection or mass lesion/mass effect. Atrophy and chronic small-vessel white matter ischemic changes again noted. Vascular: Atherosclerotic calcifications noted. Skull: Normal. Negative for fracture or focal lesion. Sinuses/Orbits: No acute finding. Other: None. IMPRESSION: 1. No evidence of acute intracranial abnormality. 2. Atrophy and chronic small-vessel white matter ischemic changes. Electronically Signed   By: Margarette Canada M.D.   On: 03/04/2019 17:20   Ct Head Wo Contrast  Result Date: 03/02/2019 CLINICAL DATA:  Altered level of consciousness EXAM: CT HEAD WITHOUT CONTRAST TECHNIQUE: Contiguous axial images were obtained from the base of the skull through the vertex without intravenous contrast. COMPARISON:  02/07/2019 FINDINGS: Brain: Chronic atrophic changes and chronic white matter ischemic changes are again noted and stable. No findings to suggest acute hemorrhage, acute infarction or space-occupying mass lesion are seen. Vascular: No hyperdense vessel or unexpected calcification. Skull: Normal. Negative for fracture or focal lesion. Sinuses/Orbits: No acute finding. Other: None. IMPRESSION: Chronic atrophic and ischemic changes stable from the prior exam. No acute abnormality noted. Electronically Signed   By: Inez Catalina M.D.   On: 03/02/2019 12:24   Ct Head Wo Contrast  Result Date: 02/07/2019 CLINICAL DATA:  85 year old with acute onset of ataxia and abnormal gait that began at approximately 8 o'clock a.m. this morning, requiring assistance with walking. Patient is usually able to walk without assistance. Current history of  dementia. EXAM: CT HEAD WITHOUT CONTRAST TECHNIQUE: Contiguous axial images were obtained from the base of the skull through the vertex without intravenous contrast. COMPARISON:  04/02/2018 and earlier. FINDINGS: Brain: Moderate cortical, deep  and cerebellar atrophy, unchanged. Severe changes of small vessel disease of the white matter diffusely, unchanged. Asymmetric LATERAL ventricles, LEFT greater than RIGHT, unchanged and felt to be developmental in origin. No mass lesion. No midline shift. No acute hemorrhage or hematoma. No extra-axial fluid collections. No evidence of acute infarction. Vascular: Mild BILATERAL carotid siphon and BILATERAL vertebral artery atherosclerosis. No hyperdense vessel. Skull: No skull fracture or other focal osseous abnormality involving the skull. Sinuses/Orbits: Very small, insignificant mucous retention cyst or polyp involving the LEFT frontal sinus. Remaining visualized paranasal sinuses well aerated. BILATERAL mastoid air cells and BILATERAL middle ear cavities well-aerated. Visualized orbits and globes unremarkable. Other: None. IMPRESSION: 1. No acute intracranial abnormality. 2. Stable moderate age related generalized atrophy and severe chronic microvascular ischemic changes of the white matter. Electronically Signed   By: Evangeline Dakin M.D.   On: 02/07/2019 13:33   Ct Abdomen Pelvis W Contrast  Result Date: 02/27/2019 CLINICAL DATA:  GI stromal tumor.  Abdominal mass. EXAM: CT ABDOMEN AND PELVIS WITH CONTRAST TECHNIQUE: Multidetector CT imaging of the abdomen and pelvis was performed using the standard protocol following bolus administration of intravenous contrast. CONTRAST:  11mL ISOVUE-300 IOPAMIDOL (ISOVUE-300) INJECTION 61% COMPARISON:  01/10/2018 FINDINGS: Lower chest: Lung parenchyma in the lower hemithorax obscured by respiratory motion. No substantial pleural effusion. Hepatobiliary: No suspicious focal abnormality within the liver parenchyma. There is no  evidence for gallstones, gallbladder wall thickening, or pericholecystic fluid. No intrahepatic or extrahepatic biliary dilation. Pancreas: No focal mass lesion. No dilatation of the main duct. No intraparenchymal cyst. No peripancreatic edema. Spleen: No splenomegaly. No focal mass lesion. Adrenals/Urinary Tract: No adrenal nodule or mass. Kidneys unremarkable. No evidence for hydroureter. The urinary bladder appears normal for the degree of distention. Stomach/Bowel: Stomach is unremarkable. No gastric wall thickening. No evidence of outlet obstruction. Duodenum is normally positioned as is the ligament of Treitz. No small bowel wall thickening. No small bowel dilatation. The terminal ileum is normal. The appendix is not visualized, but there is no edema or inflammation in the region of the cecum. No gross colonic mass. No colonic wall thickening. Vascular/Lymphatic: There is abdominal aortic atherosclerosis without aneurysm. There is no gastrohepatic or hepatoduodenal ligament lymphadenopathy. No intraperitoneal or retroperitoneal lymphadenopathy. No pelvic sidewall lymphadenopathy. Reproductive: The uterus is surgically absent. There is no adnexal mass. Other: No intraperitoneal free fluid. Mobile mesenteric mass in the pelvis again identified, position to the left of midline on today's exam (59/2). Lesion measures 8.7 x 4.7 cm today compared to 7.9 x 4.7 cm previously, suggesting no substantial interval change. Musculoskeletal: No worrisome lytic or sclerotic osseous abnormality. IMPRESSION: 1. Minimal interval enlargement of the mobile mesenteric soft tissue mass measuring 8.7 x 4.7 cm today compared to 7.9 x 4.7 cm previously. 2. Otherwise stable exam. No evidence for metastatic disease or intraperitoneal free fluid. Electronically Signed   By: Misty Stanley M.D.   On: 02/27/2019 10:36   Dg Chest Port 1 View  Result Date: 03/04/2019 CLINICAL DATA:  Patient with weakness. EXAM: PORTABLE CHEST 1 VIEW  COMPARISON:  Chest radiograph 02/07/2019 FINDINGS: Monitoring leads overlie the patient. Stable cardiomegaly. Low lung volumes. Minimal bibasilar heterogeneous opacities. No pleural effusion or pneumothorax. Thoracic spine degenerative changes. IMPRESSION: Low lung volumes with bibasilar opacities favored to represent atelectasis. Infection not excluded Electronically Signed   By: Lovey Newcomer M.D.   On: 03/04/2019 16:27   Dg Chest Port 1 View  Result Date: 02/07/2019 CLINICAL DATA:  Cough. EXAM: PORTABLE CHEST  1 VIEW COMPARISON:  Radiographs of June 16, 2018. FINDINGS: Stable cardiomediastinal silhouette. No pneumothorax or pleural effusion is noted. Atherosclerosis of thoracic aorta is noted. Both lungs are clear. The visualized skeletal structures are unremarkable. IMPRESSION: No active disease. Aortic Atherosclerosis (ICD10-I70.0). Electronically Signed   By: Marijo Conception M.D.   On: 02/07/2019 13:29   Dg Hip Unilat With Pelvis 2-3 Views Right  Result Date: 03/02/2019 CLINICAL DATA:  Fall. EXAM: DG HIP (WITH OR WITHOUT PELVIS) 2-3V RIGHT COMPARISON:  CT abdomen pelvis dated February 27, 2019. FINDINGS: No acute fracture or dislocation. Unchanged mild right hip joint space narrowing and chondrocalcinosis. Similar appearing degenerative changes of the pubic symphysis. Osteopenia. Soft tissues are unremarkable. IMPRESSION: 1.  No acute osseous abnormality. Electronically Signed   By: Titus Dubin M.D.   On: 03/02/2019 12:21    Assessment and Plan:  1. 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 Acute metabolic encephalopathy 3. Possible perforated viscous with small bowel obstruction and necrosis of the tumor in the abdomen 4. Possible sepsis 5. History of dementia 6. History of a fib - not on anticoagulation per request of family  -Awaiting surgery input regarding mass. I talked with the patient's family about the fact that she may not be a surgical candidate and that recovery may  be challenging given her other comorbidities. I have encouraged the family to consider focusing on comfort pending further discussion with surgery. Palliative medicine following.   -Encephalopathy likely due to infection. Antibiotics per hospitalist pending further [place] discussion.  Thank you for this referral.   Mikey Bussing, DNP, AGPCNP-BC, AOCNP  ADDENDUM: I saw and examined Ms. Mirsky.  It is been quite a while since I last saw her.  She definitely has declined.  She has multiple infections going on.  She has enterococcus in her urine.  She has E. coli in her blood.  I am sure that both of these are secondary to this tumor that probably is invading into her intestines at this point.  She is on IV antibiotics.  She really looks quite weak.  The family has always been very definitive as to preventing invasive procedures on her.  We have never been able to actually identify what this mass is.  At this point, I think we clearly have to consider palliative measures.  It certainly would not be unreasonable to get Hospice involved.  I think this would certainly be helpful for both the patient and her family.  I really do not see any surgical option for her.  I totally agree with what surgery has said.  There is no indication for any type of systemic therapy on her at this point.  Again since we do not have a pathologic diagnosis, I do not see any indication that we should "empirically" start treatment.  I really believe that we have to focus on her quality of life.  Hopefully with treatment of this infection, she will improve.  She certainly does not look like she is eaten all that much.  I know that she is getting fantastic care from all the staff up on [facility].  I appreciate palliative medicine seeing her.  I know that they will help out.  I have been following Ms. Rocque for several years.  She really has done nicely for the most part.  She has had a fairly good life quality.  Despite  the dementia, she has been pretty functional.  We will follow her along while in  the hospital.  Again, I really do not see that there is much that we can offer her.  I think the antibiotics will clearly be the key for her and for her recovery.  I spent about 45 minutes with her this morning.  1 of her caretakers was with her.  He was very nice.  Lattie Haw, MD  Jeneen Rinks 5:15

## 2019-03-05 NOTE — Progress Notes (Signed)
MD aware of patient's tempeture 100.7. she states to give patient the tylenol. Tylenol was given

## 2019-03-05 NOTE — Consult Note (Signed)
Consultation Note Date: 03/05/2019   Patient Name: Valerie Chen  DOB: December 27, 1928  MRN: [id number]  Age / Sex: 85 y.o., female  PCP: Briscoe Deutscher, DO Referring Physician: Elmarie Shiley, MD  Reason for Consultation: Establishing goals of care and Psychosocial/spiritual support  HPI/Patient Profile: 85 y.o. female  with past medical history of dementia, GI ST cancer, CAD, diastolic dysfunction, hypertension, dyslipidemia, migraines, major depression, suicide attempts x2, admitted on 03/04/2019 with known abdominal mass containing gas inflammation and foci of free air, necrosis of the mass.   Clinical Assessment and Goals of Care: Mrs. Paratore is resting quietly in bed.  Her eyes are open but she will only briefly make eye contact.  She is calm and cooperative, not fearful.  She is able to make her basic needs known to her longtime (6-year) caregiver Langley Gauss, who is at bedside.  Langley Gauss tells me that Mrs. Birman has lived with her daughter Rolm Bookbinder for the last 4 to 5 years.  She shares that Fraser Din and her Sister Kieth Brightly share healthcare power of attorney.  Kieth Brightly lives in [place], Fraser Din lives locally but was in [place] helping her daughter move, but is on her way back to [place]. Langley Gauss shares that family wants to focus on comfort, but continue antibiotic use, have a consultation from surgery so they can better understand their options in caring for Mrs. Robello.  Conference with bedside nursing related to patient condition, needs.  Call to daughter, Mayford Knife, [phone], no answer, VM is full, unable to leqave VM message. Langley Gauss has shared that the team should call Kieth Brightly because Fraser Din will driving to [place] from [place].  No call to [facility] at this time.     Awaiting surgical consult.  PMT to follow.    HCPOA    HCPOA -daughters Mayford Knife and Rolm Bookbinder share healthcare power of attorney.   SUMMARY  OF RECOMMENDATIONS   Continue to focus on pain management, but continue antibiotics. Awaiting input from surgery  Code Status/Advance Care Planning:  DNR  Symptom Management:   Per hospitalist, no additional needs at this time.  Palliative Prophylaxis:   Frequent Pain Assessment and Turn Reposition  Additional Recommendations (Limitations, Scope, Preferences):  No CPR, no intubation, considering surgery, considering hospice care  Psycho-social/Spiritual:   Desire for further Chaplaincy support:no  Additional Recommendations: Caregiving  Support/Resources and Education on Hospice  Prognosis:   Unable to determine, based on outcomes, family choice for direction of care.   Discharge Planning: To be determined, based on outcomes, family choice.      Primary Diagnoses: Present on Admission: . Acute encephalopathy . SBO (small bowel obstruction) ([place]) . Perforation bowel ([place])   I have reviewed the medical record, interviewed the patient and family, and examined the patient. The following aspects are pertinent.  Past Medical History:  Diagnosis Date  . Alzheimer disease ([place])   . Arthritis   . CAD (coronary artery disease)    Probable CAD, nuclear February, 2012, possible anteroseptal and inferoseptal ischemia but  the study was technically difficult  . Cancer ([place])   . Chest pain    Hospital February, 2012, nuclear, possible anteroseptal and inferoseptal ischemia, technically difficult, medical therapy  . Dementia ([place])    Significant  . Diastolic dysfunction    Echo, February, 2012  . Diastolic dysfunction   . DNR (do not resuscitate)   . Dyslipidemia   . Edema    hospitalizations September, 2013, some fluid overload  . Ejection fraction    EF 55%, echo, February, 2012  //   EF 55-60%, echo, February 25, 2012 // Echo 12/17: EF 60-65, no RWMA, Gr 1 DD, normal RVSF  . Essential hypertension   . GI bleed    February, 2012 incomplete colonoscopy, hemorrhoids,  needs complete colonoscopy  . 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 Hx: UTI (urinary tract infection)   . Hyperlipidemia   . Leg pain, bilateral   . Major depression    2 overdoses in 1975 & 1977  . Migraines    25 year  . Orthostatic hypotension   . Suicide attempt ([place]) 1985   2 attempts with Valium  . TIA (transient ischemic attack)       ???question of a TIA in February, 2012, carotid Doppler, August 11, 2010 no significant abnormalities   Social History   Socioeconomic History  . Marital status: Widowed    Spouse name: Not on file  . Number of children: 3  . Years of education: HS  . Highest education level: Not on file  Occupational History  . Occupation: RETIRED  Social Needs  . Financial resource strain: Not on file  . Food insecurity    Worry: Never true    Inability: Never true  . Transportation needs    Medical: No    Non-medical: Not on file  Tobacco Use  . Smoking status: Never Smoker  . Smokeless tobacco: Never Used  Substance and Sexual Activity  . Alcohol use: No  . Drug use: Yes    Comment: hx of valium in 1980's - had 2 overdoses per daughter  . Sexual activity: Never    Birth control/protection: None  Lifestyle  . Physical activity    Days per week: 3 days    Minutes per session: 30 min  . Stress: Only a little  Relationships  . Social connections    Talks on phone: More than three times a week    Gets together: Three times a week    Attends religious service: 1 to 4 times per year    Active member of club or organization: No    Attends meetings of clubs or organizations: Never    Relationship status: Widowed  Other Topics Concern  . Not on file  Social History Narrative   Has 24/7 caregiver      2 husbands   3 children      Diet: Heart Health   Current/Past profession- Retired Engineer, manufacturing systems   Exercise- Yes, walking   Living Will-Yes   DNR- Yes   POA/HPOA- Yes   Right-handed   Family  History  Problem Relation Age of Onset  . Heart attack Father   . Cancer Father   . Arthritis Father   . Suicidality Mother   . Depression Mother   . Heart attack Brother   . Heart disease Brother   . Pneumonia Brother   . Suicidality Son   . Hypertension Daughter   . Depression Other  Scheduled Meds: Continuous Infusions: . sodium chloride 75 mL/hr at 03/05/19 1002  . ceFEPime (MAXIPIME) IV 2 g (03/05/19 0615)  . [START ON 03/06/2019] vancomycin     PRN Meds:.acetaminophen **OR** acetaminophen, fentaNYL (SUBLIMAZE) injection, ondansetron **OR** ondansetron (ZOFRAN) IV Medications Prior to Admission:  Prior to Admission medications   Medication Sig Start Date End Date Taking? Authorizing Provider  acetaminophen (TYLENOL) 500 MG tablet Take 1,000 mg by mouth every 6 (six) hours as needed for moderate pain.   Yes [provider]  Ascorbic Acid (VITAMIN C) 1000 MG tablet Take 1,000 mg by mouth every morning.    Yes [provider]  aspirin EC 81 MG tablet Take 81 mg by mouth every morning.    Yes [provider]  Cholecalciferol (VITAMIN D-3) 1000 units CAPS Take 1,000 Units by mouth every morning.    Yes [provider]  cyanocobalamin 500 MCG tablet Take 500 mcg by mouth every morning.    Yes [provider]  Multiple Vitamin (MULTIVITAMIN WITH MINERALS) TABS tablet Take 1 tablet by mouth every morning.    Yes [provider]  nitrofurantoin, macrocrystal-monohydrate, (MACROBID) 100 MG capsule Take 1 capsule (100 mg total) by mouth 2 (two) times daily. 03/02/19  Yes Lawyer, Harrell Gave, PA-C  Omega-3 Fatty Acids (OMEGA-3 FISH OIL) 300 MG CAPS Take 2 capsules by mouth every morning.    Yes [provider]  OVER THE COUNTER MEDICATION Take 1 capsule by mouth 2 (two) times daily with a meal. "Fem Dophilus" for urinary tract health   Yes [provider]  OVER THE COUNTER MEDICATION Take 5 mLs by mouth daily. D-Mannose  powder - for urinary tract health - mix in 6 oz water and drink   Yes [provider]   Allergies  Allergen Reactions  . Levaquin [Levofloxacin] Other (See Comments)    Altered mental status   . Rocephin [Ceftriaxone] Other (See Comments)    Uncontrollable chills/shaking, dizziness, and disorientation after receiving this (??)  . Statins Other (See Comments)    Severe myalgias   Review of Systems  Unable to perform ROS: Dementia    Physical Exam Vitals signs and nursing note reviewed.  Constitutional:      General: She is not in acute distress.    Appearance: She is ill-appearing.  HENT:     Head: Atraumatic.  Cardiovascular:     Rate and Rhythm: Normal rate.  Pulmonary:     Effort: Pulmonary effort is normal. No respiratory distress.  Abdominal:     General: Abdomen is flat.     Palpations: Abdomen is soft.     Tenderness: There is no guarding.  Skin:    General: Skin is warm and dry.  Neurological:     Mental Status: She is alert.     Comments: Known dementia  Psychiatric:     Comments: Calm, not fearful     Vital Signs: BP 119/61 (BP Location: Right Arm)   Pulse 88   Temp 98.5 F (36.9 C) (Oral)   Resp (!) 26   Ht 5\' 3"  (1.6 m)   Wt 79.4 kg   SpO2 95%   BMI 31.00 kg/m  Pain Scale: Faces   Pain Score: 5    SpO2: SpO2: 95 % O2 Device:SpO2: 95 % O2 Flow Rate: .   IO: Intake/output summary:   Intake/Output Summary (Last 24 hours) at 03/05/2019 1428 Last data filed at 03/05/2019 1100 Gross per 24 hour  Intake 450 ml  Output 150 ml  Net 300 ml    LBM: Last BM Date: 03/04/19 Baseline Weight: Weight: 75.8 kg Most recent weight: Weight: 79.4 kg     Palliative Assessment/Data:   Flowsheet Rows     Most Recent Value  Intake Tab  Referral Department  Hospitalist  Unit at Time of Referral  Cardiac/Telemetry Unit  Palliative Care Primary Diagnosis  Cancer  Date Notified  03/05/19  Palliative Care Type  New Palliative care  Reason for  referral  Clarify Goals of Care  Date of Admission  03/04/19  Date first seen by Palliative Care  03/05/19  # of days Palliative referral response time  0 Day(s)  # of days IP prior to Palliative referral  1  Clinical Assessment  Palliative Performance Scale Score  30%  Pain Max last 24 hours  Not able to report  Pain Min Last 24 hours  Not able to report  Dyspnea Max Last 24 Hours  Not able to report  Dyspnea Min Last 24 hours  Not able to report  Psychosocial & Spiritual Assessment  Palliative Care Outcomes      Time In: 1330 Time Out: 1420 Time Total: 50 minutes  Greater than 50%  of this time was spent counseling and coordinating care related to the above assessment and plan.  Signed by: Drue Novel, NP   Please contact Palliative Medicine Team phone at [phone] for questions and concerns.  For individual provider: See Shea Evans

## 2019-03-05 NOTE — Telephone Encounter (Signed)
Number busy ok to put in order?

## 2019-03-06 ENCOUNTER — Telehealth: Payer: Self-pay | Admitting: *Deleted

## 2019-03-06 DIAGNOSIS — K659 Peritonitis, unspecified: Secondary | ICD-10-CM

## 2019-03-06 DIAGNOSIS — G9340 Encephalopathy, unspecified: Secondary | ICD-10-CM

## 2019-03-06 DIAGNOSIS — Z515 Encounter for palliative care: Secondary | ICD-10-CM

## 2019-03-06 LAB — BASIC METABOLIC PANEL
Anion gap: 10 (ref 5–15)
BUN: 20 mg/dL (ref 8–23)
CO2: 19 mmol/L — ABNORMAL LOW (ref 22–32)
Calcium: 8.6 mg/dL — ABNORMAL LOW (ref 8.9–10.3)
Chloride: 105 mmol/L (ref 98–111)
Creatinine, Ser: 0.68 mg/dL (ref 0.44–1.00)
GFR calc Af Amer: >60 mL/min (ref >60)
GFR calc non Af Amer: >60 mL/min (ref >60)
Glucose, Bld: 103 mg/dL — ABNORMAL HIGH (ref 70–99)
Potassium: 4 mmol/L (ref 3.5–5.1)
Sodium: 134 mmol/L — ABNORMAL LOW (ref 135–145)

## 2019-03-06 LAB — CBC
HCT: 44.4 % (ref 36.0–46.0)
Hemoglobin: 14.7 g/dL (ref 12.0–15.0)
MCH: 30.7 pg (ref 26.0–34.0)
MCHC: 33.1 g/dL (ref 30.0–36.0)
MCV: 92.7 fL (ref 80.0–100.0)
Platelets: 151 10*3/uL (ref 150–400)
RBC: 4.79 MIL/uL (ref 3.87–5.11)
RDW: 13.2 % (ref 11.5–15.5)
WBC: 5.6 10*3/uL (ref 4.0–10.5)
nRBC: 0 % (ref 0.0–0.2)

## 2019-03-06 LAB — CEA: CEA: 1.3 ng/mL (ref 0.0–4.7)

## 2019-03-06 LAB — GLUCOSE, CAPILLARY: Glucose-Capillary: 112 mg/dL — ABNORMAL HIGH (ref 70–99)

## 2019-03-06 NOTE — Progress Notes (Signed)
PROGRESS NOTE    Valerie Chen  [id number] DOB: [birth year] DOA: 03/04/2019 PCP: Briscoe Deutscher, DO   Brief Narrative: 85 year old with past medical history of abdominal tomorrow thought to be GI ST, no biopsy done, family never pursue surgery or biopsy, dementia, coronary artery disease, diastolic dysfunction, A. fib was noted to have increase generalized weakness over the last 2 days.  She was treated for UTI with antibiotics.  Patient had an acute change in mental status and worsening abdominal pain and vomiting the day of admission.  Patient had a temperature of 101 at home.  Evaluation in the ED patient was found to be encephalopathic with minimal response, sodium 128 creatinine 0.9, lactate of 2.5, hemoglobin 14, white blood cell 5.7.  CT abdomen and pelvis showed possible necrosis of the tumor with possible perforation and small bowel obstruction. Initially family discussed with the ER physician who requested no active measure and no surgical intervention and keep patient in comfort.  Plan was to continue with IV antibiotics.  Patient family requested surgical evaluation.  Dr. Lucia Gaskins evaluated patient.  Patient is not a surgical candidate. Family would like to continue with IV antibiotics for now and and planning further discussion with Dr. Marin Olp and palliative care.    Assessment & Plan:   Principal Problem:   Acute encephalopathy Active Problems:   SBO (small bowel obstruction) (HCC)   Perforation bowel (HCC)   Abdominal infection ([place])   Palliative care encounter   Goals of care, counseling/discussion   Palliative care by specialist  1-known Abdominal mass, containing gas inflammation and foci of free air.  Necrosis of the mass with gas formation superimposed infection or involvement of bowel with perforation.  Dilated loop of small bowel within the left abdomen which may represent focal ileus or developing small bowel obstruction: -Continue with IV fluids, IV vancomycin  and cefepime. -Family would like to speak with surgery regarding options for surgery risk and benefits. -Appreciate surgery evaluation, patient is not a candidate for emergent surgery. -Family is okay with fentanyl PRN for pain, hold morphine drip for now until further clarification of goals of cares. -Family would like to see how patient responds to IV antibiotics.  They are planning to have further discussion for goals of care with palliative care and Dr. Marin Olp  2-E. coli bacteremia: Related to intra-abdominal mass.  Continue with IV cefepime. Follow culture sensitivity.  Acute toxic metabolic encephalopathy: Likely related to infection. Treat underlying infection.  -Sepsis: Patient presented with tachypnea, tachycardia, fevers, source of infection intra-abdominal mass. Continue with IV fluids and IV antibiotics. Urine culture from 9-11 grew enterococcus.  Repeated urine culture 9/13 no growth today.  -History of dementia: Supportive care. -History of A. fib: Not on anticoagulation -Hyponatremia: Continue with IV fluids. Improved.  Estimated body mass index is 30.89 kg/m as calculated from the following:   Height as of this encounter: 5\' 3"  (1.6 m).   Weight as of this encounter: 79.1 kg.   DVT prophylaxis: SCDs Code Status: DNR Family Communication: Daughter over the phone Disposition Plan: Continue with IV antibiotics for now, surgery consultation Consultants:   Surgery  Palliative  Procedures:   none  Antimicrobials:  Vancomycin 9-13 Cefepime 9/13  Subjective: Patient is sleepy she will open eyes to voice and say a few words.   Objective: Vitals:   03/05/19 2328 03/05/19 2350 03/06/19 0645 03/06/19 0845  BP: 119/81  120/66 126/72  Pulse: (!) 105  85 78  Resp: 15  18 (!) 24  Temp: 98.1 F (36.7 C) 99.4 F (37.4 C) 97.9 F (36.6 C) 98.2 F (36.8 C)  TempSrc: Axillary Rectal Oral Oral  SpO2: 98%  94% 95%  Weight: 79.1 kg     Height:         Intake/Output Summary (Last 24 hours) at 03/06/2019 1015 Last data filed at 03/06/2019 0900 Gross per 24 hour  Intake 469.98 ml  Output 150 ml  Net 319.98 ml   Filed Weights   03/04/19 1536 03/05/19 0249 03/05/19 2328  Weight: 75.8 kg 79.4 kg 79.1 kg    Examination:  General exam: No acute distress Respiratory system: Clear to auscultation Cardiovascular system: S1, S2 regular rhythm and rate Gastrointestinal system: Bowel sounds present, very tender to palpation Central nervous system: Lethargic Extremities: No edema   Data Reviewed: I have personally reviewed following labs and imaging studies  CBC: Recent Labs  Lab 03/02/19 1245 03/04/19 1602  WBC 11.6* 5.7  NEUTROABS 9.4* 5.0  HGB 15.2* 14.9  HCT 45.9 42.3  MCV 93.1 89.1  PLT 152 [id number]*   Basic Metabolic Panel: Recent Labs  Lab 03/02/19 1245 03/04/19 1602  NA 135 128*  K 3.8 3.8  CL 101 95*  CO2 27 22  GLUCOSE 123* 148*  BUN 11 15  CREATININE 0.77 0.93  CALCIUM 9.2 8.8*   GFR: Estimated Creatinine Clearance: 41.7 mL/min (by C-G formula based on SCr of 0.93 mg/dL). Liver Function Tests: Recent Labs  Lab 03/02/19 1245 03/04/19 1602  AST 23 29  ALT 27 34  ALKPHOS 68 75  BILITOT 1.6* 1.6*  PROT 6.5 5.7*  ALBUMIN 3.7 3.0*   No results for input(s): LIPASE, AMYLASE in the last 168 hours. No results for input(s): AMMONIA in the last 168 hours. Coagulation Profile: Recent Labs  Lab 03/04/19 1602  INR 1.1   Cardiac Enzymes: No results for input(s): CKTOTAL, CKMB, CKMBINDEX, TROPONINI in the last 168 hours. BNP (last 3 results) No results for input(s): PROBNP in the last 8760 hours. HbA1C: No results for input(s): HGBA1C in the last 72 hours. CBG: Recent Labs  Lab 03/06/19 0317  GLUCAP 112*   Lipid Profile: No results for input(s): CHOL, HDL, LDLCALC, TRIG, CHOLHDL, LDLDIRECT in the last 72 hours. Thyroid Function Tests: No results for input(s): TSH, T4TOTAL, FREET4, T3FREE, THYROIDAB in  the last 72 hours. Anemia Panel: No results for input(s): VITAMINB12, FOLATE, FERRITIN, TIBC, IRON, RETICCTPCT in the last 72 hours. Sepsis Labs: Recent Labs  Lab 03/04/19 1600 03/04/19 1826  LATICACIDVEN 2.5* 2.4*    Recent Results (from the past 240 hour(s))  Urine culture     Status: Abnormal   Collection Time: 03/02/19  3:01 PM   Specimen: Urine, Clean Catch  Result Value Ref Range Status   Specimen Description   Final    URINE, CLEAN CATCH Performed at [facility], [place] [address], [place], [place] [address]    Special Requests   Final    NONE Performed at [facility], [place] [address], [place], [place] [address]    Culture >=100,000 COLONIES/mL ENTEROCOCCUS FAECALIS (A)  Final   Report Status 03/05/2019 FINAL  Final   Organism ID, Bacteria ENTEROCOCCUS FAECALIS (A)  Final      Susceptibility   Enterococcus faecalis - MIC*    AMPICILLIN <=2 SENSITIVE Sensitive     LEVOFLOXACIN 2 SENSITIVE Sensitive     NITROFURANTOIN <=16 SENSITIVE Sensitive     VANCOMYCIN 2 SENSITIVE Sensitive     * >=  100,000 COLONIES/mL ENTEROCOCCUS FAECALIS  Blood Culture (routine x 2)     Status: Abnormal (Preliminary result)   Collection Time: 03/04/19  4:02 PM   Specimen: BLOOD  Result Value Ref Range Status   Specimen Description BLOOD LEFT ANTECUBITAL  Final   Special Requests   Final    BOTTLES DRAWN AEROBIC AND ANAEROBIC Blood Culture adequate volume   Culture  Setup Time   Final    GRAM NEGATIVE RODS ANAEROBIC BOTTLE ONLY CRITICAL RESULT CALLED TO, READ BACK BY AND VERIFIED WITH: E PEREZ,PHARMD AT 1611 03/05/2019 BY L BENFIELD    Culture (A)  Final    ESCHERICHIA COLI SUSCEPTIBILITIES TO FOLLOW Performed at [facility], [address], [place], [place] [address]    Report Status PENDING  Incomplete  Urine culture     Status: None   Collection Time: 03/04/19  4:02 PM   Specimen: In/Out Cath Urine  Result Value Ref Range Status   Specimen  Description IN/OUT CATH URINE  Final   Special Requests NONE  Final   Culture   Final    NO GROWTH Performed at [facility], [place] [address], [place], [place] [address]    Report Status 03/05/2019 FINAL  Final  Blood Culture ID Panel (Reflexed)     Status: Abnormal   Collection Time: 03/04/19  4:02 PM  Result Value Ref Range Status   Enterococcus species NOT DETECTED NOT DETECTED Final   Listeria monocytogenes NOT DETECTED NOT DETECTED Final   Staphylococcus species NOT DETECTED NOT DETECTED Final   Staphylococcus aureus (BCID) NOT DETECTED NOT DETECTED Final   Streptococcus species NOT DETECTED NOT DETECTED Final   Streptococcus agalactiae NOT DETECTED NOT DETECTED Final   Streptococcus pneumoniae NOT DETECTED NOT DETECTED Final   Streptococcus pyogenes NOT DETECTED NOT DETECTED Final   Acinetobacter baumannii NOT DETECTED NOT DETECTED Final   Enterobacteriaceae species DETECTED (A) NOT DETECTED Final    Comment: Enterobacteriaceae represent a large family of gram-negative bacteria, not a single organism. CRITICAL RESULT CALLED TO, READ BACK BY AND VERIFIED WITH: E PEREZ,PHARMD AT 1611 03/05/2019 BY L BENFIELD    Enterobacter cloacae complex NOT DETECTED NOT DETECTED Final   Escherichia coli DETECTED (A) NOT DETECTED Final    Comment: CRITICAL RESULT CALLED TO, READ BACK BY AND VERIFIED WITH: E PEREZ,PHARMD AT 1611 03/05/2019 BY L BENFIELD    Klebsiella oxytoca NOT DETECTED NOT DETECTED Final   Klebsiella pneumoniae NOT DETECTED NOT DETECTED Final   Proteus species NOT DETECTED NOT DETECTED Final   Serratia marcescens NOT DETECTED NOT DETECTED Final   Carbapenem resistance NOT DETECTED NOT DETECTED Final   Haemophilus influenzae NOT DETECTED NOT DETECTED Final   Neisseria meningitidis NOT DETECTED NOT DETECTED Final   Pseudomonas aeruginosa NOT DETECTED NOT DETECTED Final   Candida albicans NOT DETECTED NOT DETECTED Final   Candida glabrata NOT DETECTED NOT DETECTED Final    Candida krusei NOT DETECTED NOT DETECTED Final   Candida parapsilosis NOT DETECTED NOT DETECTED Final   Candida tropicalis NOT DETECTED NOT DETECTED Final    Comment: Performed at [facility], [place] [address], [place], [place] [address]  SARS Coronavirus 2 [facility] order, Performed in [facility] hospital lab) Nasopharyngeal Nasopharyngeal Swab     Status: None   Collection Time: 03/04/19  4:39 PM   Specimen: Nasopharyngeal Swab  Result Value Ref Range Status   SARS Coronavirus 2 NEGATIVE NEGATIVE Final    Comment: (NOTE) If result is  NEGATIVE SARS-CoV-2 target nucleic acids are NOT DETECTED. The SARS-CoV-2 RNA is generally detectable in upper and lower  respiratory specimens during the acute phase of infection. The lowest  concentration of SARS-CoV-2 viral copies this assay can detect is 250  copies / mL. A negative result does not preclude SARS-CoV-2 infection  and should not be used as the sole basis for treatment or other  patient management decisions.  A negative result may occur with  improper specimen collection / handling, submission of specimen other  than nasopharyngeal swab, presence of viral mutation(s) within the  areas targeted by this assay, and inadequate number of viral copies  (<250 copies / mL). A negative result must be combined with clinical  observations, patient history, and epidemiological information. If result is POSITIVE SARS-CoV-2 target nucleic acids are DETECTED. The SARS-CoV-2 RNA is generally detectable in upper and lower  respiratory specimens dur ing the acute phase of infection.  Positive  results are indicative of active infection with SARS-CoV-2.  Clinical  correlation with patient history and other diagnostic information is  necessary to determine patient infection status.  Positive results do  not rule out bacterial infection or co-infection with other viruses. If result is PRESUMPTIVE POSTIVE SARS-CoV-2 nucleic acids MAY BE PRESENT.    A presumptive positive result was obtained on the submitted specimen  and confirmed on repeat testing.  While 2019 novel coronavirus  (SARS-CoV-2) nucleic acids may be present in the submitted sample  additional confirmatory testing may be necessary for epidemiological  and / or clinical management purposes  to differentiate between  SARS-CoV-2 and other Sarbecovirus currently known to infect humans.  If clinically indicated additional testing with an alternate test  methodology [phone]) is advised. The SARS-CoV-2 RNA is generally  detectable in upper and lower respiratory sp ecimens during the acute  phase of infection. The expected result is Negative. Fact Sheet for Patients:  StrictlyIdeas.no Fact Sheet for Healthcare Providers: BankingDealers.co.za This test is not yet approved or cleared by the [place] FDA and has been authorized for detection and/or diagnosis of SARS-CoV-2 by FDA under an Emergency Use Authorization (EUA).  This EUA will remain in effect (meaning this test can be used) for the duration of the COVID-19 declaration under Section 564(b)(1) of the Act, 21 U.S.C. section 360bbb-3(b)(1), unless the authorization is terminated or revoked sooner. Performed at [facility], [place] [address] [place], [place] [address]          Radiology Studies: Ct Abdomen Pelvis Wo Contrast  Result Date: 03/04/2019 CLINICAL DATA:  85 year old female with abdominal and pelvic pain with fever. Known mesenteric mass. EXAM: CT ABDOMEN AND PELVIS WITHOUT CONTRAST TECHNIQUE: Multidetector CT imaging of the abdomen and pelvis was performed following the standard protocol without IV contrast. COMPARISON:  02/27/2019 CT and prior studies FINDINGS: Please note that parenchymal abnormalities may be missed without intravenous contrast. Lower chest: Trace bilateral pleural effusions and bibasilar opacities/atelectasis noted. Cardiomegaly is  present. Hepatobiliary: The liver and gallbladder are unremarkable. No biliary dilatation. Pancreas: Unremarkable Spleen: Unremarkable Adrenals/Urinary Tract: The kidneys, adrenal glands and bladder are unremarkable. Stomach/Bowel: The previously identified abdominal mass measures 6.5 x 10 cm and now contains gas. Moderate inflammation and foci of pneumoperitoneum are noted adjacent to the mass. There are mildly distended loops of small bowel within the LEFT abdomen. No other bowel abnormalities are identified. Vascular/Lymphatic: Aortic atherosclerosis. No enlarged abdominal or pelvic lymph nodes. Reproductive: Status post hysterectomy. No adnexal masses. Other: No ascites or focal  collection. Musculoskeletal: No acute or suspicious bony abnormalities identified. IMPRESSION: 1. Large abdominal mass again noted, but now containing gas with moderate adjacent inflammation and foci of free air. This may represent necrosis of this mass with gas formation, superimposed infection or involvement of bowel with perforation. No evidence of abscess. 2. Dilated loops of small bowel within the LEFT abdomen which may represent a focal ileus or developing small bowel obstruction. 3. Trace bilateral pleural effusions with bibasilar opacities/atelectasis. 4.  Aortic Atherosclerosis (ICD10-I70.0). Electronically Signed   By: Margarette Canada M.D.   On: 03/04/2019 17:35   Ct Head Wo Contrast  Result Date: 03/04/2019 CLINICAL DATA:  85 year old female with altered level of consciousness. EXAM: CT HEAD WITHOUT CONTRAST TECHNIQUE: Contiguous axial images were obtained from the base of the skull through the vertex without intravenous contrast. COMPARISON:  03/02/2019 CT and prior studies FINDINGS: Brain: No evidence of acute infarction, hemorrhage, hydrocephalus, extra-axial collection or mass lesion/mass effect. Atrophy and chronic small-vessel white matter ischemic changes again noted. Vascular: Atherosclerotic calcifications noted.  Skull: Normal. Negative for fracture or focal lesion. Sinuses/Orbits: No acute finding. Other: None. IMPRESSION: 1. No evidence of acute intracranial abnormality. 2. Atrophy and chronic small-vessel white matter ischemic changes. Electronically Signed   By: Margarette Canada M.D.   On: 03/04/2019 17:20   Dg Chest Port 1 View  Result Date: 03/04/2019 CLINICAL DATA:  Patient with weakness. EXAM: PORTABLE CHEST 1 VIEW COMPARISON:  Chest radiograph 02/07/2019 FINDINGS: Monitoring leads overlie the patient. Stable cardiomegaly. Low lung volumes. Minimal bibasilar heterogeneous opacities. No pleural effusion or pneumothorax. Thoracic spine degenerative changes. IMPRESSION: Low lung volumes with bibasilar opacities favored to represent atelectasis. Infection not excluded Electronically Signed   By: Lovey Newcomer M.D.   On: 03/04/2019 16:27        Scheduled Meds: Continuous Infusions: . sodium chloride 75 mL/hr at 03/05/19 2325  . ceFEPime (MAXIPIME) IV 2 g (03/06/19 0556)     LOS: 2 days    Time spent: 35 minutes.     Elmarie Shiley, MD [facility] Pager [phone]  If 7PM-7AM, please contact night-coverage [email or web address] Password [facility] 03/06/2019, 10:15 AM

## 2019-03-06 NOTE — Progress Notes (Signed)
Palliative:  Valerie Chen is lying quietly in bed.  She will make an somewhat keep eye contact.  She has known dementia, but is calm and cooperative, not fearful.  Present today at bedside is longtime caregiver Valerie Chen.  As I am entering daughter Valerie Chen calls Denise's cell phone.  We have an extended conversation about [name]'s acute and chronic health concerns.    Valerie Chen shares that she was told by several providers that her mother could stay in the hospital, transition to a palliative or hospice floor if they chose.  We talked about guidelines of care, requirements for hospital stay.  I share that [facility] no longer has a hospice floor, instead, if the person is accepted in residential hospice and awaiting a bed, they will receive hospice type care while hospitalized.  Valerie Chen shares that her Sister Valerie Chen is having a difficult time, stating that she would have to leave the home if [name] returns to their home.  We have lengthy conversation about how to make choices for loved ones; 1) keeping her at the center of decision-making, not what we want for her 2) are we doing something for her or to her, (can we change what is happening) 3) the person [name] was 10 years ago. If that 1 of them were here during this conversation, how would she tell us to care for [name] now?  Valerie Chen shares that [name] has been talking about going to be with her deceased son for about 1 year now.  Valerie Chen shares the family's distress over stopping treatment too soon versus carrying treatment on for too long.  Valerie Chen states, "I know that she is transitioning".  She goes further to state that she feels like her mother has been transitioning for a year now.    We talked about DNR status, allowing natural death.  We also talked about the concept of "let nature take its course".  I give an example of when this is appropriate sharing that it is not illegal, not unethical, but we understand some people have a  moral problem with not doing everything.  I share that just like I will get sick again, [name] will get sick again also.  Valerie Chen states that family preference is quality over quantity.  Conference with bedside nursing staff, social worker/case management, and attending related to patient condition, goals of care conversation, disposition.  Many facets of the palliative discussion with family and caregiver were had that are not noted here.  Dictation via dragon.  Plan: 24-48 hours of antibiotic use, then CT to evaluate abdomen (if appropriate/possible).  If obstruction or tear noted on CT, then family will likely focus on comfort care.   37minutes, extended time  Quinn Axe, NP Palliative Medicine Team Team Phone # [phone] Greater than 50% of this time was spent counseling and coordinating care related to the above assessment and plan.

## 2019-03-06 NOTE — Telephone Encounter (Signed)
Post ED Visit - Positive Culture Follow-up  Culture report reviewed by antimicrobial stewardship pharmacist: [place] Team []  Elenor Quinones, Pharm.D. []  Heide Guile, Pharm.D., BCPS AQ-ID []  Parks Neptune, Pharm.D., BCPS []  Alycia Rossetti, Pharm.D., BCPS []  [place], Pharm.D., BCPS, AAHIVP []  Legrand Como, Pharm.D., BCPS, AAHIVP []  Salome Arnt, PharmD, BCPS []  Johnnette Gourd, PharmD, BCPS []  Hughes Better, PharmD, BCPS []  Leeroy Cha, PharmD []  Laqueta Linden, PharmD, BCPS []  Albertina Parr, PharmD  [place] Team []  Leodis Sias, PharmD []  Lindell Spar, PharmD []  Royetta Asal, PharmD []  Graylin Shiver, Rph []  Rema Fendt) Glennon Mac, PharmD []  Arlyn Dunning, PharmD []  Netta Cedars, PharmD [x]  Dia Sitter, PharmD []  Leone Haven, PharmD []  Gretta Arab, PharmD []  Theodis Shove, PharmD []  Peggyann Juba, PharmD []  Reuel Boom, PharmD   Positive urine culture Treated with Nitrofurantoin Monohyd Macro.  Patient is currently an inpatient at [facility] and no further patient follow-up is required at this time.  Harlon Flor [facility] 03/06/2019, 2:07 PM

## 2019-03-06 NOTE — Care Management Important Message (Signed)
Important Message  Patient Details  Name: Valerie Chen MRN: [id number] Date of Birth: 13-Feb-1929   Medicare Important Message Given:  Yes     Brittney Caraway 03/06/2019, 4:02 PM

## 2019-03-06 NOTE — Evaluation (Signed)
Clinical/Bedside Swallow Evaluation Patient Details  Name: Valerie Chen MRN: [id number] Date of Birth: September 08, 1928  Today's Date: 03/06/2019 Time: SLP Start Time (ACUTE ONLY): 1032 SLP Stop Time (ACUTE ONLY): 1048 SLP Time Calculation (min) (ACUTE ONLY): 16 min  Past Medical History:  Past Medical History:  Diagnosis Date  . Alzheimer disease ([place])   . Arthritis   . CAD (coronary artery disease)    Probable CAD, nuclear February, 2012, possible anteroseptal and inferoseptal ischemia but the study was technically difficult  . Cancer ([place])   . Chest pain    Hospital February, 2012, nuclear, possible anteroseptal and inferoseptal ischemia, technically difficult, medical therapy  . Dementia ([place])    Significant  . Diastolic dysfunction    Echo, February, 2012  . Diastolic dysfunction   . DNR (do not resuscitate)   . Dyslipidemia   . Edema    hospitalizations September, 2013, some fluid overload  . Ejection fraction    EF 55%, echo, February, 2012  //   EF 55-60%, echo, February 25, 2012 // Echo 12/17: EF 60-65, no RWMA, Gr 1 DD, normal RVSF  . Essential hypertension   . GI bleed    February, 2012 incomplete colonoscopy, hemorrhoids, needs complete colonoscopy  . 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 Hx: UTI (urinary tract infection)   . Hyperlipidemia   . Leg pain, bilateral   . Major depression    2 overdoses in 1975 & 1977  . Migraines    25 year  . Orthostatic hypotension   . Suicide attempt ([place]) 1985   2 attempts with Valium  . TIA (transient ischemic attack)       ???question of a TIA in February, 2012, carotid Doppler, August 11, 2010 no significant abnormalities   Past Surgical History:  Past Surgical History:  Procedure Laterality Date  . ABDOMINAL HYSTERECTOMY    . TONSILLECTOMY     HPI:  85 y.o. female  with past medical history of dementia, GI ST cancer, CAD, diastolic dysfunction, hypertension, dyslipidemia,  migraines, major depression, suicide attempts x2, admitted on 03/04/2019 with known abdominal mass containing gas inflammation and foci of free air, necrosis of the mass. Pt had prior swallow evaluation in 2016 that was [facility] and caregiver/family deny trouble recently.    Assessment / Plan / Recommendation Clinical Impression  Pt was too lethargic for PO intake this morning. She could briefly follow simple commands and responded x2 to questioning, but otherwise cannot sustain her alertness. She did receive pain meds this morning per caregiver. Her caregiver and her daughter both deny any prior swallowing difficulties, and perhaps only one instance of PNA over the last few years. Her caregiver does share that she had "projectile vomiting" around the time of admission. We discussed certain risk factors for aspiration at the moment, including lethargy and potential for post-prandial aspiration in the setting of GI problems. They would like for her to be able to have at least ice chips and sips of water if alert and asking. They are interested in this for her comfort, knowing that her aspiration risk is unknown at this time as POs could not be observed. MD in agreement with allowing sips/chips for comfort if alert. Per family request, SLP will f/u briefly for tolerance and any additional education.   FYI: Daughter Kieth Brightly) is also asking to speak directly wtih Dr. Marin Olp - RN aware and paging MD.   SLP Visit Diagnosis: Dysphagia, unspecified (R13.10)    Aspiration  Risk  Moderate aspiration risk    Diet Recommendation Ice chips PRN after oral care;Free water protocol after oral care;Other (Comment)(ice chips and water for comfort when alert)   Medication Administration: Via alternative means Supervision: Staff to assist with self feeding;Full supervision/cueing for compensatory strategies Compensations: Slow rate;Small sips/bites Postural Changes: Seated upright at 90 degrees;Remain upright for at least 30  minutes after po intake    Other  Recommendations Oral Care Recommendations: Oral care QID   Follow up Recommendations (family wants hospice)      Frequency and Duration min 1 x/week  1 week       Prognosis Prognosis for Safe Diet Advancement: Fair Barriers to Reach Goals: Other (Comment)(overall medical prognosis)      Swallow Study   General HPI: 85 y.o. female  with past medical history of dementia, GI ST cancer, CAD, diastolic dysfunction, hypertension, dyslipidemia, migraines, major depression, suicide attempts x2, admitted on 03/04/2019 with known abdominal mass containing gas inflammation and foci of free air, necrosis of the mass. Pt had prior swallow evaluation in 2016 that was [facility] and caregiver/family deny trouble recently.  Type of Study: Bedside Swallow Evaluation Previous Swallow Assessment: see HPI Diet Prior to this Study: NPO Temperature Spikes Noted: Yes(100.7) Respiratory Status: Room air History of Recent Intubation: No Behavior/Cognition: Lethargic/Drowsy;Cooperative Oral Cavity Assessment: (UTA) Oral Care Completed by SLP: No Oral Cavity - Dentition: (UTA) Self-Feeding Abilities: Other (Comment)(not alert enough) Patient Positioning: Upright in bed Baseline Vocal Quality: Normal    Oral/Motor/Sensory Function Overall Oral Motor/Sensory Function: (UTA)   Ice Chips Ice chips: Impaired Presentation: Spoon Oral Phase Impairments: Poor awareness of bolus;Other (comment)(too lethargic)   Thin Liquid Thin Liquid: Not tested    Nectar Thick Nectar Thick Liquid: Not tested   Honey Thick Honey Thick Liquid: Not tested   Puree Puree: Not tested   Solid     Solid: Not tested      Venita Sheffield Matthias Bogus 03/06/2019,11:09 AM  Pollyann Glen, M.A. [place] Acute Environmental education officer [phone] Office [phone]

## 2019-03-06 NOTE — Plan of Care (Signed)
  Problem: Pain Managment: Goal: General experience of comfort will improve Outcome: Progressing   

## 2019-03-07 ENCOUNTER — Inpatient Hospital Stay (HOSPITAL_COMMUNITY): Payer: Medicare Other

## 2019-03-07 LAB — BASIC METABOLIC PANEL
Anion gap: 7 (ref 5–15)
BUN: 18 mg/dL (ref 8–23)
CO2: 18 mmol/L — ABNORMAL LOW (ref 22–32)
Calcium: 8.1 mg/dL — ABNORMAL LOW (ref 8.9–10.3)
Chloride: 110 mmol/L (ref 98–111)
Creatinine, Ser: 0.62 mg/dL (ref 0.44–1.00)
GFR calc Af Amer: >60 mL/min (ref >60)
GFR calc non Af Amer: >60 mL/min (ref >60)
Glucose, Bld: 99 mg/dL (ref 70–99)
Potassium: 3.6 mmol/L (ref 3.5–5.1)
Sodium: 135 mmol/L (ref 135–145)

## 2019-03-07 LAB — CULTURE, BLOOD (ROUTINE X 2): Special Requests: ADEQUATE

## 2019-03-07 LAB — CBC
HCT: 41.1 % (ref 36.0–46.0)
Hemoglobin: 13.8 g/dL (ref 12.0–15.0)
MCH: 30.8 pg (ref 26.0–34.0)
MCHC: 33.6 g/dL (ref 30.0–36.0)
MCV: 91.7 fL (ref 80.0–100.0)
Platelets: 161 10*3/uL (ref 150–400)
RBC: 4.48 MIL/uL (ref 3.87–5.11)
RDW: 13.2 % (ref 11.5–15.5)
WBC: 4.9 10*3/uL (ref 4.0–10.5)
nRBC: 0 % (ref 0.0–0.2)

## 2019-03-07 LAB — GLUCOSE, CAPILLARY
Glucose-Capillary: 112 mg/dL — ABNORMAL HIGH (ref 70–99)
Glucose-Capillary: 97 mg/dL (ref 70–99)
Glucose-Capillary: 104 mg/dL — ABNORMAL HIGH (ref 70–99)
Glucose-Capillary: 96 mg/dL (ref 70–99)
Glucose-Capillary: 106 mg/dL — ABNORMAL HIGH (ref 70–99)

## 2019-03-07 MED ORDER — SODIUM BICARBONATE 8.4 % IV SOLN
INTRAVENOUS | Status: DC
  Administered 2019-03-07: 22:00:00 via INTRAVENOUS
  Filled 2019-03-07: qty 100

## 2019-03-07 MED ORDER — POTASSIUM CHLORIDE 2 MEQ/ML IV SOLN
INTRAVENOUS | Status: DC
  Administered 2019-03-07 – 2019-03-10 (×3): via INTRAVENOUS
  Filled 2019-03-07 (×4): qty 1000

## 2019-03-07 MED ORDER — ENOXAPARIN SODIUM 40 MG/0.4ML ~~LOC~~ SOLN
40.0000 mg | SUBCUTANEOUS | Status: DC
  Administered 2019-03-07 – 2019-03-08 (×2): 40 mg via SUBCUTANEOUS
  Filled 2019-03-07 (×2): qty 0.4

## 2019-03-07 NOTE — Progress Notes (Signed)
Overall, [name] is about the same.  She is growing enterococcus in her urine and E. coli in her blood.  The sensitivities for the E. coli were not yet back.  I think the real issue is whether or not she has a bowel perforation because of this tumor.  If she does, I would suspect that this infection will keep recurring.  She still is not eating much at all.  There does not appear to be pain although when I touch her abdomen, she does have some discomfort.  Her labs today show a white cell count of 4.9.  Hemoglobin 13.8.  Platelet count 161,000.  Her BUN is 18 creatinine 0.62.  In talking to her daughter, Kieth Brightly, yesterday, I think the plan is to try to get her home if she can improve.  I would like to think that given that she really has not had any problem with infection before, she is really not been on antibiotics, that these bacteria should be sensitive to her antibiotics.  We just do not know if the infection will recur if there is any type of violation of the bowel wall.  She does have some abdominal distention this morning.  It might be worthwhile to get an abdominal film to see if there is any obvious free air.  I do not think she has had any obvious fever.  Still, our goal is her quality of life and comfort.  Maybe, her diet can at some point be advanced.  I very much appreciate the wonderful care that she is getting from everybody up on 5 M!!  Lattie Haw, MD  Colossians 3:23

## 2019-03-07 NOTE — Progress Notes (Signed)
PROGRESS NOTE  Valerie Chen [id number] DOB: Sep 23, 1928 DOA: 03/04/2019 PCP: Briscoe Deutscher, DO  HPI/Recap of past 57 hours:  85 year old with past medical history of abdominal tomorrow thought to be GI ST, no biopsy done, family never pursue surgery or biopsy, dementia, coronary artery disease, diastolic dysfunction, A. fib was noted to have increase generalized weakness over the last 2 days.  She was treated for UTI with antibiotics.  Patient had an acute change in mental status and worsening abdominal pain and vomiting the day of admission.  Patient had a temperature of 101 at home.  Evaluation in the ED patient was found to be encephalopathic with minimal response, sodium 128 creatinine 0.9, lactate of 2.5, hemoglobin 14, white blood cell 5.7.  CT abdomen and pelvis showed possible necrosis of the tumor with possible perforation and small bowel obstruction. Initially family discussed with the ER physician who requested no active measure and no surgical intervention and keep patient in comfort.  Plan was to continue with IV antibiotics.  Patient family requested surgical evaluation.  Dr. Lucia Gaskins evaluated patient.  Patient is not a surgical candidate. Family would like to continue with IV antibiotics for now and and planning further discussion with Dr. Marin Olp and palliative care.  03/07/19: Patient was seen and examined at her bedside.  Her personal sitter present.  Appears uncomfortable, likely in pain but unable to verbalize.  Bedside RN getting IV pain medication from [facility].  Assessment/Plan: Principal Problem:   Acute encephalopathy Active Problems:   SBO (small bowel obstruction) (HCC)   Perforation bowel (HCC)   Abdominal infection ([place])   Palliative care encounter   Goals of care, counseling/discussion   Palliative care by specialist   Encounter for hospice care discussion  Known abdominal mass containing gas/inflammation/foci of free air  Necrosis of the mass with gas  formation superimposed infection or involvement of bowel with perforation.  Dilated loop of small bowel within the left abdomen which may represent focal ileus or developing small bowel obstruction Follow-up abdominal x-ray done on 03/07/2019 shows increasing partial small bowel obstruction. Optimize pain control N.p.o. except for comfort care Poor prognosis Seen by palliative care, following. Seen by oncology, following Per note, family wanting to continue treatment Continue IV antibiotics empirically Currently on IV cefepime Continue IV fluid hydration  Acute metabolic encephalopathy likely multifactorial secondary to partial SBO versus E. coli bacteremia versus Enterococcus faecalis UTI versus known abdominal mass in the setting of dementia and advanced age. Treat underlying conditions Reorient as necessary  Sepsis secondary to E. coli bacteremia and Enterococcus faecalis UTI Presented with tachycardia and tachypnea in the setting of bacteremia and UTI Management as stated below Sepsis criteria appears to be improving on IV antibiotics Monitor fever curve and WBC  E. coli bacteremia likely from GI source Continue to treat underlying condition Blood cultures drawn on 03/04/2019 positive for E. 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 Marin Olp  Chronic A. fib not on oral anti-coagulation Rate is controlled   Risks: High risk for decompensation due to sepsis, multifactorial, persistent acute metabolic encephalopathy, multiple comorbidities and advanced age.   DVT prophylaxis: SCDs, daily subcu Lovenox.  Code Status: DNR Family Communication: None at bedside  Disposition Plan: Continue with IV antibiotics for  now, surgery consultation. Consultants:   Surgery  Palliative  Oncology  Procedures:   none  Antimicrobials:  Vancomycin 9-13 Cefepime 9/13    Objective: Vitals:   03/06/19 2020 03/07/19 0217 03/07/19 0535 03/07/19 0958  BP: 121/66 122/64 (!) 145/64 (!) 140/98  Pulse: 100 (!) 106 99 72  Resp: (!) 22 20 18 18   Temp: 99.9 F (37.7 C) 99.9 F (37.7 C) 99 F (37.2 C)   TempSrc: Axillary Axillary Oral   SpO2: 93% 92% 91% 93%  Weight:      Height:        Intake/Output Summary (Last 24 hours) at 03/07/2019 1342 Last data filed at 03/07/2019 1130 Gross per 24 hour  Intake 0 ml  Output 650 ml  Net -650 ml   Filed Weights   03/04/19 1536 03/05/19 0249 03/05/19 2328  Weight: 75.8 kg 79.4 kg 79.1 kg    Exam:  . General: 85 y.o. year-old female well developed well nourished in no acute distress.  Lethargic. . Cardiovascular: Irregular rate and rhythm with no rubs or gallops.  No thyromegaly or JVD noted.   Marland Kitchen Respiratory: Clear to auscultation with no wheezes or rales. Good inspiratory effort. . Abdomen: Soft nontender nondistended with normal bowel sounds x4 quadrants. . Musculoskeletal: Trace lower extremity edema. 2/4 pulses in all 4 extremities. Marland Kitchen Psychiatry: Unable to assess mood due to lethargy.   Data Reviewed: CBC: Recent Labs  Lab 03/02/19 1245 03/04/19 1602 03/06/19 1052 03/07/19 0447  WBC 11.6* 5.7 5.6 4.9  NEUTROABS 9.4* 5.0  --   --   HGB 15.2* 14.9 14.7 13.8  HCT 45.9 42.3 44.4 41.1  MCV 93.1 89.1 92.7 91.7  PLT 152 130* 151 [id number]   Basic Metabolic Panel: Recent Labs  Lab 03/02/19 1245 03/04/19 1602 03/06/19 1052 03/07/19 0447  NA 135 128* 134* 135  K 3.8 3.8 4.0 3.6  CL 101 95* 105 110  CO2 27 22 19* 18*  GLUCOSE 123* 148* 103* 99  BUN 11 15 20 18   CREATININE 0.77 0.93 0.68 0.62  CALCIUM 9.2 8.8* 8.6* 8.1*   GFR: Estimated Creatinine Clearance: 48.4 mL/min (by C-G formula based  on SCr of 0.62 mg/dL). Liver Function Tests: Recent Labs  Lab 03/02/19 1245 03/04/19 1602  AST 23 29  ALT 27 34  ALKPHOS 68 75  BILITOT 1.6* 1.6*  PROT 6.5 5.7*  ALBUMIN 3.7 3.0*   No results for input(s): LIPASE, AMYLASE in the last 168 hours. No results for input(s): AMMONIA in the last 168 hours. Coagulation Profile: Recent Labs  Lab 03/04/19 1602  INR 1.1   Cardiac Enzymes: No results for input(s): CKTOTAL, CKMB, CKMBINDEX, TROPONINI in the last 168 hours. BNP (last 3 results) No results for input(s): PROBNP in the last 8760 hours. HbA1C: No results for input(s): HGBA1C in the last 72 hours. CBG: Recent Labs  Lab 03/06/19 0317 03/07/19 0303 03/07/19 0728 03/07/19 1130  GLUCAP 112* 112* 97 104*   Lipid Profile: No results for input(s): CHOL, HDL, LDLCALC, TRIG, CHOLHDL, LDLDIRECT in the last 72 hours. Thyroid Function Tests: No results for input(s): TSH, T4TOTAL, FREET4, T3FREE, THYROIDAB in the last 72 hours. Anemia Panel: No results for input(s): VITAMINB12, FOLATE, FERRITIN, TIBC, IRON, RETICCTPCT in the last 72 hours. Urine analysis:    Component Value Date/Time   COLORURINE AMBER (A) 03/04/2019 1740   APPEARANCEUR HAZY (A) 03/04/2019 1740   LABSPEC 1.012 03/04/2019 1740   PHURINE 5.0 03/04/2019 1740   GLUCOSEU NEGATIVE 03/04/2019 1740  GLUCOSEU NEGATIVE 12/26/2018 [place] 03/04/2019 1740   BILIRUBINUR NEGATIVE 03/04/2019 1740   BILIRUBINUR Negative 06/06/2018 1450   KETONESUR NEGATIVE 03/04/2019 1740   PROTEINUR 30 (A) 03/04/2019 1740   UROBILINOGEN 0.2 12/26/2018 1055   NITRITE NEGATIVE 03/04/2019 1740   LEUKOCYTESUR NEGATIVE 03/04/2019 1740   Sepsis Labs: @LABRCNTIP (procalcitonin:4,lacticidven:4)  ) Recent Results (from the past 240 hour(s))  Urine culture     Status: Abnormal   Collection Time: 03/02/19  3:01 PM   Specimen: Urine, Clean Catch  Result Value Ref Range Status   Specimen Description   Final    URINE, CLEAN  CATCH Performed at [facility], [place] [address] [place], [place] [address]    Special Requests   Final    NONE Performed at [facility], [place] [address], [place], [place] [address]    Culture >=100,000 COLONIES/mL ENTEROCOCCUS FAECALIS (A)  Final   Report Status 03/05/2019 FINAL  Final   Organism ID, Bacteria ENTEROCOCCUS FAECALIS (A)  Final      Susceptibility   Enterococcus faecalis - MIC*    AMPICILLIN <=2 SENSITIVE Sensitive     LEVOFLOXACIN 2 SENSITIVE Sensitive     NITROFURANTOIN <=16 SENSITIVE Sensitive     VANCOMYCIN 2 SENSITIVE Sensitive     * >=100,000 COLONIES/mL ENTEROCOCCUS FAECALIS  Blood Culture (routine x 2)     Status: Abnormal   Collection Time: 03/04/19  4:02 PM   Specimen: BLOOD  Result Value Ref Range Status   Specimen Description BLOOD LEFT ANTECUBITAL  Final   Special Requests   Final    BOTTLES DRAWN AEROBIC AND ANAEROBIC Blood Culture adequate volume   Culture  Setup Time   Final    GRAM NEGATIVE RODS ANAEROBIC BOTTLE ONLY CRITICAL RESULT CALLED TO, READ BACK BY AND VERIFIED WITH: E PEREZ,PHARMD AT 1611 03/05/2019 BY L BENFIELD Performed at [facility], [place] [address], [place], [place] [address]    Culture ESCHERICHIA COLI (A)  Final   Report Status 03/07/2019 FINAL  Final   Organism ID, Bacteria ESCHERICHIA COLI  Final      Susceptibility   Escherichia coli - MIC*    AMPICILLIN >=32 RESISTANT Resistant     CEFAZOLIN >=64 RESISTANT Resistant     CEFEPIME <=1 SENSITIVE Sensitive     CEFTAZIDIME <=1 SENSITIVE Sensitive     CEFTRIAXONE <=1 SENSITIVE Sensitive     CIPROFLOXACIN <=0.25 SENSITIVE Sensitive     GENTAMICIN <=1 SENSITIVE Sensitive     IMIPENEM <=0.25 SENSITIVE Sensitive     TRIMETH/SULFA <=20 SENSITIVE Sensitive     AMPICILLIN/SULBACTAM >=32 RESISTANT Resistant     PIP/TAZO 8 SENSITIVE Sensitive     Extended ESBL NEGATIVE Sensitive     * ESCHERICHIA COLI  Urine culture     Status: None    Collection Time: 03/04/19  4:02 PM   Specimen: In/Out Cath Urine  Result Value Ref Range Status   Specimen Description IN/OUT CATH URINE  Final   Special Requests NONE  Final   Culture   Final    NO GROWTH Performed at [facility], [place] [address], [place],  [address]    Report Status 03/05/2019 FINAL  Final  Blood Culture ID Panel (Reflexed)     Status: Abnormal   Collection Time: 03/04/19  4:02 PM  Result Value Ref Range Status   Enterococcus species NOT DETECTED NOT DETECTED Final   Listeria monocytogenes NOT DETECTED NOT DETECTED Final  Staphylococcus species NOT DETECTED NOT DETECTED Final   Staphylococcus aureus (BCID) NOT DETECTED NOT DETECTED Final   Streptococcus species NOT DETECTED NOT DETECTED Final   Streptococcus agalactiae NOT DETECTED NOT DETECTED Final   Streptococcus pneumoniae NOT DETECTED NOT DETECTED Final   Streptococcus pyogenes NOT DETECTED NOT DETECTED Final   Acinetobacter baumannii NOT DETECTED NOT DETECTED Final   Enterobacteriaceae species DETECTED (A) NOT DETECTED Final    Comment: Enterobacteriaceae represent a large family of gram-negative bacteria, not a single organism. CRITICAL RESULT CALLED TO, READ BACK BY AND VERIFIED WITH: E PEREZ,PHARMD AT 1611 03/05/2019 BY L BENFIELD    Enterobacter cloacae complex NOT DETECTED NOT DETECTED Final   Escherichia coli DETECTED (A) NOT DETECTED Final    Comment: CRITICAL RESULT CALLED TO, READ BACK BY AND VERIFIED WITH: E PEREZ,PHARMD AT 1611 03/05/2019 BY L BENFIELD    Klebsiella oxytoca NOT DETECTED NOT DETECTED Final   Klebsiella pneumoniae NOT DETECTED NOT DETECTED Final   Proteus species NOT DETECTED NOT DETECTED Final   Serratia marcescens NOT DETECTED NOT DETECTED Final   Carbapenem resistance NOT DETECTED NOT DETECTED Final   Haemophilus influenzae NOT DETECTED NOT DETECTED Final   Neisseria meningitidis NOT DETECTED NOT DETECTED Final   Pseudomonas aeruginosa NOT DETECTED NOT DETECTED  Final   Candida albicans NOT DETECTED NOT DETECTED Final   Candida glabrata NOT DETECTED NOT DETECTED Final   Candida krusei NOT DETECTED NOT DETECTED Final   Candida parapsilosis NOT DETECTED NOT DETECTED Final   Candida tropicalis NOT DETECTED NOT DETECTED Final    Comment: Performed at [facility], [place] [address] [place], [place] [address]  SARS Coronavirus 2 [facility] order, Performed in [facility] hospital lab) Nasopharyngeal Nasopharyngeal Swab     Status: None   Collection Time: 03/04/19  4:39 PM   Specimen: Nasopharyngeal Swab  Result Value Ref Range Status   SARS Coronavirus 2 NEGATIVE NEGATIVE Final    Comment: (NOTE) If result is NEGATIVE SARS-CoV-2 target nucleic acids are NOT DETECTED. The SARS-CoV-2 RNA is generally detectable in upper and lower  respiratory specimens during the acute phase of infection. The lowest  concentration of SARS-CoV-2 viral copies this assay can detect is 250  copies / mL. A negative result does not preclude SARS-CoV-2 infection  and should not be used as the sole basis for treatment or other  patient management decisions.  A negative result may occur with  improper specimen collection / handling, submission of specimen other  than nasopharyngeal swab, presence of viral mutation(s) within the  areas targeted by this assay, and inadequate number of viral copies  (<250 copies / mL). A negative result must be combined with clinical  observations, patient history, and epidemiological information. If result is POSITIVE SARS-CoV-2 target nucleic acids are DETECTED. The SARS-CoV-2 RNA is generally detectable in upper and lower  respiratory specimens dur ing the acute phase of infection.  Positive  results are indicative of active infection with SARS-CoV-2.  Clinical  correlation with patient history and other diagnostic information is  necessary to determine patient infection status.  Positive results do  not rule out bacterial infection or  co-infection with other viruses. If result is PRESUMPTIVE POSTIVE SARS-CoV-2 nucleic acids MAY BE PRESENT.   A presumptive positive result was obtained on the submitted specimen  and confirmed on repeat testing.  While 2019 novel coronavirus  (SARS-CoV-2) nucleic acids may be present in the submitted sample  additional confirmatory testing may be necessary for epidemiological  and / or clinical management purposes  to differentiate between  SARS-CoV-2 and other Sarbecovirus currently known to infect humans.  If clinically indicated additional testing with an alternate test  methodology [id number]) is advised. The SARS-CoV-2 RNA is generally  detectable in upper and lower respiratory sp ecimens during the acute  phase of infection. The expected result is Negative. Fact Sheet for Patients:  StrictlyIdeas.no Fact Sheet for Healthcare Providers: BankingDealers.co.za This test is not yet approved or cleared by the [place] FDA and has been authorized for detection and/or diagnosis of SARS-CoV-2 by FDA under an Emergency Use Authorization (EUA).  This EUA will remain in effect (meaning this test can be used) for the duration of the COVID-19 declaration under Section 564(b)(1) of the Act, 21 U.S.C. section 360bbb-3(b)(1), unless the authorization is terminated or revoked sooner. Performed at [facility], [place] [address], [place], [place] [address]   Blood Culture (routine x 2)     Status: None (Preliminary result)   Collection Time: 03/05/19 12:26 AM   Specimen: BLOOD  Result Value Ref Range Status   Specimen Description BLOOD RIGHT ARM  Final   Special Requests   Final    BOTTLES DRAWN AEROBIC ONLY Blood Culture adequate volume   Culture   Final    NO GROWTH 2 DAYS Performed at [facility], [place] [address], [place], [place] [address]    Report Status PENDING  Incomplete      Studies: Dg Abd 2 Views  Result Date:  03/07/2019 CLINICAL DATA:  Small bowel obstruction. EXAM: ABDOMEN - 2 VIEW COMPARISON:  Radiographs dated 09/17/2012 and CT scan of the abdomen dated 03/04/2019. FINDINGS: There is increased distention of multiple small bowel loops in the mid abdomen. There is air in the nondistended colon. Small bilateral pleural effusions. Pneumoperitoneum was noted on the prior CT scan. No discrete free air is noted on this exam. IMPRESSION: Increasing partial small bowel obstruction. Electronically Signed   By: Lorriane Shire M.D.   On: 03/07/2019 11:18    Scheduled Meds:  Continuous Infusions: . sodium chloride 75 mL/hr at 03/07/19 0641  . ceFEPime (MAXIPIME) IV 2 g (03/07/19 [id number])     LOS: 3 days     Kayleen Memos, MD [facility] Pager [phone]  If 7PM-7AM, please contact night-coverage [email or web address] Password [email or web address] 03/07/2019, 1:42 PM

## 2019-03-07 NOTE — Progress Notes (Signed)
Daily Progress Note   Patient Name: Valerie Chen       Date: 03/07/2019 DOB: [birth year]  Age: 85 y.o. MRN#: [id number] Attending Physician: Kayleen Memos, DO Primary Care Physician: Briscoe Deutscher, DO Admit Date: 03/04/2019  Reason for Consultation/Follow-up: Establishing goals of care  Subjective: Patient wakes to voice. Recently given prn fentanyl for abdominal pain and appears comfortable. Oriented to name but otherwise disoriented with baseline dementia.   GOC:   F/u palliative discussion with daughter Valerie Chen) and caregiver Valerie Chen) at bedside. Daughter Valerie Chen) on speaker phone. Valerie Chen and Valerie Chen are both HCPOA's for their mother and make decisions jointly.   Reviewed course of hospitalization including diagnoses, interventions, plan of care and palliative/hospice options.  Valerie Chen appreciates conversations with Dr. Marin Olp and speaks of pending xray (performed about 40 minutes prior to my arrival and not yet read). Valerie Chen speaks of plan to continue current medical plan of care with IVF and ABX to determine if these interventions will be beneficial. Also, to repeat CT if necessary to determine if there is a blockage. Discussed poor candidacy for surgery. Also discussed daughter's decisions for conservative management, confirming DNR code status. Valerie Chen relies heavily on Dr. Antonieta Pert input regarding diagnoses and prognosis in the days to come.   Valerie Chen shares a bad past experience with hospice services (they would not attempt to treat pneumonia?). Valerie Chen, and [place] do seem to understand concern with poor prognosis secondary likely obstruction, incurable tumor, infection, and poor nutritional status. We did discuss hospice options following hospitalization, especially if she does not respond to  antibiotics. Discussed hospice facility.   Valerie Chen, and [place] to share most important goal of managing Valerie Chen's pain and ensuring she is comfortable and not suffering. Valerie Chen jokes that her mother has "cheated death so many times" and that she has had "nine lives."   Emotional support provided. Therapeutic listening and answered all questions and concerns. Reassured of ongoing palliative support this admit. [facility] Cho[facility] and PMT contact information given.   **At the end of visit, xray had resulted with concerns for increasing partial small bowel obstruction. This was relayed to the family.     Length of Stay: 3  Current Medications: Scheduled Meds:    Continuous Infusions: . sodium chloride 75 mL/hr at 03/07/19 0641  . ceFEPime (MAXIPIME) IV 2 g (03/07/19  0643)    PRN Meds: acetaminophen **OR** acetaminophen, fentaNYL (SUBLIMAZE) injection, ondansetron **OR** ondansetron (ZOFRAN) IV  Physical Exam Vitals signs and nursing note reviewed.  Constitutional:      Appearance: She is cachectic. She is ill-appearing.  HENT:     Head: Normocephalic and atraumatic.  Pulmonary:     Effort: No tachypnea, accessory muscle usage or respiratory distress.  Abdominal:     Tenderness: There is abdominal tenderness.  Skin:    General: Skin is warm and dry.     Coloration: Skin is pale.  Neurological:     Mental Status: She is easily aroused.     Comments: Alert/disoriented with baseline dementia  Psychiatric:        Attention and Perception: She is inattentive.        Speech: Speech is delayed.        Cognition and Memory: Cognition is impaired.            Vital Signs: BP (!) 140/98 (BP Location: Right Arm)   Pulse 72   Temp 99 F (37.2 C) (Oral)   Resp 18   Ht 5\' 3"  (1.6 m)   Wt 79.1 kg   SpO2 93%   BMI 30.89 kg/m  SpO2: SpO2: 93 % O2 Device: O2 Device: Room Air O2 Flow Rate:    Intake/output summary:   Intake/Output Summary (Last 24 hours) at 03/07/2019 1133 Last data  filed at 03/07/2019 0045 Gross per 24 hour  Intake 0 ml  Output 550 ml  Net -550 ml   LBM: Last BM Date: 03/03/19 Baseline Weight: Weight: 75.8 kg Most recent weight: Weight: 79.1 kg       Palliative Assessment/Data: PPS 30%   Flowsheet Rows     Most Recent Value  Intake Tab  Referral Department  Hospitalist  Unit at Time of Referral  Cardiac/Telemetry Unit  Palliative Care Primary Diagnosis  Cancer  Date Notified  03/05/19  Palliative Care Type  New Palliative care  Reason for referral  Clarify Goals of Care  Date of Admission  03/04/19  Date first seen by Palliative Care  03/05/19  # of days Palliative referral response time  0 Day(s)  # of days IP prior to Palliative referral  1  Clinical Assessment  Palliative Performance Scale Score  30%  Pain Max last 24 hours  Not able to report  Pain Min Last 24 hours  Not able to report  Dyspnea Max Last 24 Hours  Not able to report  Dyspnea Min Last 24 hours  Not able to report  Psychosocial & Spiritual Assessment  Palliative Care Outcomes      Patient Active Problem List   Diagnosis Date Noted  . Encounter for hospice care discussion   . SBO (small bowel obstruction) ([place]) 03/05/2019  . Perforation bowel ([place]) 03/05/2019  . Abdominal infection ([place]) 03/05/2019  . Palliative care encounter 03/05/2019  . Goals of care, counseling/discussion   . Palliative care by specialist   . Acute encephalopathy 03/04/2019  . Syncope 06/09/2018  . New onset atrial fibrillation ([place]) 06/09/2018  . URI with cough and congestion 06/09/2018  . GIST (gastrointestinal stromal tumor) of small bowel, malignant ([place]) 01/12/2018  . Breast mass, right 11/29/2017  . Skin lesion of back 11/29/2017  . Difficulty walking 02/23/2017  . Dementia, previously on Seroquel, able to wean with family and caregiver support 01/21/2017  . Abdominal mass, monitored by [facility] 01/21/2017  . NSVT (nonsustained ventricular tachycardia) ([place]) 05/11/2016  .  Recurrent UTI, on daily Trimethroprim 02/01/2015  . Essential hypertension, on no medications 05/30/2013  . Coronary artery disease   . Diastolic dysfunction   . TIA (transient ischemic attack), questionable, 07/2010, with negative carotid dopplers   . History of GI bleed, with hemorrhoids, last colonoscopy 2012 at time of bleed   . Dyslipidemia, no statin   . Arthralgia of multiple joints 05/22/2009  . Vitamin D deficiency 01/27/2009    Palliative Care Assessment & Plan   Patient Profile: 85 y.o. female  with past medical history of dementia, GI ST cancer, CAD, diastolic dysfunction, hypertension, dyslipidemia, migraines, major depression, suicide attempts x2, admitted on 03/04/2019 with known abdominal mass containing gas inflammation and foci of free air, necrosis of the mass.   Assessment: Known abdominal necrotic mass with gas formation Partial small bowel obstruction Acute encephalopathy E. Coli bacteremia Enterococcus faecalis UTI Baseline dementia  Recommendations/Plan:  Continue current plan of care and watchful waiting for medical response to antibiotics.   Family confirms conservative measures including DNR and understanding that she is not a surgical candidate.  Discussed outpatient palliative vs. Hospice options pending clinical course.   PMT will follow inpatient. Appreciate oncology discussions with family regarding diagnoses/prognosis.  Code Status: DNR   Code Status Orders  (From admission, onward)         Start     Ordered   03/05/19 0056  Do not attempt resuscitation (DNR)  Continuous    Question Answer Comment  In the event of cardiac or respiratory ARREST Do not call a "code blue"   In the event of cardiac or respiratory ARREST Do not perform Intubation, CPR, defibrillation or ACLS   In the event of cardiac or respiratory ARREST Use medication by any route, position, wound care, and other measures to relive pain and suffering. May use oxygen, suction  and manual treatment of airway obstruction as needed for comfort.      03/05/19 0056        Code Status History    Date Active Date Inactive Code Status Order ID Comments User Context   03/04/2019 2247 03/05/2019 0056 DNR [id number]  [name], MD ED   06/09/2018 2107 06/10/2018 1827 DNR [id number]  Vianne Bulls, MD ED   06/09/2018 2107 06/09/2018 2107 DNR [id number]  Vianne Bulls, MD ED   02/17/2017 1701 02/19/2017 1501 DNR [id number]  Caren Griffins, MD Inpatient   01/21/2017 2304 01/22/2017 2102 DNR [id number]  [name], MD Inpatient   05/06/2016 1739 05/08/2016 1454 DNR [id number]  Tonye Royalty, MD Inpatient   12/29/2015 1625 12/30/2015 2016 DNR [id number]  Willia Craze, NP Inpatient   02/01/2015 1226 02/05/2015 1547 DNR [id number]  Bonnielee Haff, MD Inpatient   11/01/2014 2136 11/04/2014 1648 DNR [id number]  Ma Hillock, DO Inpatient   Advance Care Planning Activity    Advance Directive Documentation     Most Recent Value  Type of Advance Directive  Healthcare Power of Attorney, Out of facility DNR (pink MOST or yellow form)  Pre-existing out of facility DNR order (yellow form or pink MOST form)  Physician notified to receive inpatient order, Pink MOST/Yellow Form most recent copy in chart - Physician notified to receive inpatient order  "MOST" Form in Place?  -       Prognosis:   Poor prognosis  Discharge Planning:  To Be Determined  Care plan was discussed with daughters Valerie Chen and [place]) and caregiver Valerie Chen)  Thank you for allowing the  Palliative Medicine Team to assist in the care of this patient.   Time In: 1050 Time Out: 1130 Total Time 40 Prolonged Time Billed  no      Greater than 50%  of this time was spent counseling and coordinating care related to the above assessment and plan.  Ihor Dow, DNP, FNP-C Palliative Medicine Team  Phone: [phone] Fax: [phone]  Please contact Palliative Medicine Team phone at [phone]  for questions and concerns.

## 2019-03-07 NOTE — Progress Notes (Signed)
SLP Cancellation Note  Patient Details Name: Valerie Chen MRN: [id number] DOB: 1929-05-30   Cancelled treatment:       Reason Eval/Treat Not Completed: Fatigue/lethargy limiting ability to participate. Pt sleeping soundly, but caregiver says that she has been doing ice chips when alert without any overt swallowing issues and without any regurgitation. Chart reviewed including results of abdominal imaging. If pt/family are wanting comfort care, POs can be restarted without SLP f/u based upon their goals of comfort. If they are not ready to pursue full comfort care, would have to defer to MD regarding ability to attempt any PO diet in the setting of bowel obstruction. SLP will continue to follow for now.   Valerie Chen 03/07/2019, 2:48 PM  Valerie Chen, M.A. [place] Acute Environmental education officer [phone] Office [phone]

## 2019-03-08 ENCOUNTER — Inpatient Hospital Stay (HOSPITAL_COMMUNITY): Payer: Medicare Other

## 2019-03-08 LAB — CBC WITH DIFFERENTIAL/PLATELET
Abs Immature Granulocytes: 0.09 10*3/uL — ABNORMAL HIGH (ref 0.00–0.07)
Basophils Absolute: 0 10*3/uL (ref 0.0–0.1)
Basophils Relative: 0 %
Eosinophils Absolute: 0 10*3/uL (ref 0.0–0.5)
Eosinophils Relative: 0 %
HCT: 41.5 % (ref 36.0–46.0)
Hemoglobin: 14 g/dL (ref 12.0–15.0)
Immature Granulocytes: 2 %
Lymphocytes Relative: 12 %
Lymphs Abs: 0.7 10*3/uL (ref 0.7–4.0)
MCH: 30.7 pg (ref 26.0–34.0)
MCHC: 33.7 g/dL (ref 30.0–36.0)
MCV: 91 fL (ref 80.0–100.0)
Monocytes Absolute: 0.7 10*3/uL (ref 0.1–1.0)
Monocytes Relative: 11 %
Neutro Abs: 4.5 10*3/uL (ref 1.7–7.7)
Neutrophils Relative %: 75 %
Platelets: 194 10*3/uL (ref 150–400)
RBC: 4.56 MIL/uL (ref 3.87–5.11)
RDW: 13.3 % (ref 11.5–15.5)
WBC: 6 10*3/uL (ref 4.0–10.5)
nRBC: 0 % (ref 0.0–0.2)

## 2019-03-08 LAB — COMPREHENSIVE METABOLIC PANEL
ALT: 22 U/L (ref 0–44)
AST: 21 U/L (ref 15–41)
Albumin: 2 g/dL — ABNORMAL LOW (ref 3.5–5.0)
Alkaline Phosphatase: 75 U/L (ref 38–126)
Anion gap: 8 (ref 5–15)
BUN: 19 mg/dL (ref 8–23)
CO2: 23 mmol/L (ref 22–32)
Calcium: 8.2 mg/dL — ABNORMAL LOW (ref 8.9–10.3)
Chloride: 108 mmol/L (ref 98–111)
Creatinine, Ser: 0.65 mg/dL (ref 0.44–1.00)
GFR calc Af Amer: >60 mL/min (ref >60)
GFR calc non Af Amer: >60 mL/min (ref >60)
Glucose, Bld: 136 mg/dL — ABNORMAL HIGH (ref 70–99)
Potassium: 3.3 mmol/L — ABNORMAL LOW (ref 3.5–5.1)
Sodium: 139 mmol/L (ref 135–145)
Total Bilirubin: 1.1 mg/dL (ref 0.3–1.2)
Total Protein: 5.2 g/dL — ABNORMAL LOW (ref 6.5–8.1)

## 2019-03-08 LAB — LACTIC ACID, PLASMA: Lactic Acid, Venous: 1.2 mmol/L (ref 0.5–1.9)

## 2019-03-08 LAB — PHOSPHORUS: Phosphorus: 1.5 mg/dL — ABNORMAL LOW (ref 2.5–4.6)

## 2019-03-08 LAB — PREALBUMIN: Prealbumin: 5.1 mg/dL — ABNORMAL LOW (ref 18–38)

## 2019-03-08 LAB — GLUCOSE, CAPILLARY
Glucose-Capillary: 117 mg/dL — ABNORMAL HIGH (ref 70–99)
Glucose-Capillary: 128 mg/dL — ABNORMAL HIGH (ref 70–99)
Glucose-Capillary: 129 mg/dL — ABNORMAL HIGH (ref 70–99)

## 2019-03-08 LAB — MAGNESIUM: Magnesium: 2.1 mg/dL (ref 1.7–2.4)

## 2019-03-08 LAB — PROCALCITONIN: Procalcitonin: 0.79 ng/mL

## 2019-03-08 MED ORDER — IOHEXOL 300 MG/ML  SOLN
100.0000 mL | Freq: Once | INTRAMUSCULAR | Status: AC | PRN
  Administered 2019-03-08: 100 mL via INTRAVENOUS

## 2019-03-08 MED ORDER — POTASSIUM PHOSPHATES 15 MMOLE/5ML IV SOLN
40.0000 meq | Freq: Once | INTRAVENOUS | Status: AC
  Administered 2019-03-08: 40 meq via INTRAVENOUS
  Filled 2019-03-08: qty 9.09

## 2019-03-08 NOTE — Progress Notes (Signed)
Daily Progress Note   Patient Name: Valerie Chen       Date: 03/08/2019 DOB: [birth year]  Age: 85 y.o. MRN#: [id number] Attending Physician: Kayleen Memos, DO Primary Care Physician: Briscoe Deutscher, DO Admit Date: 03/04/2019  Reason for Consultation/Follow-up: Establishing goals of care  Subjective: Patient wakes to voice but drowsy. She does not appear to be in pain or discomfort. She is pleasantly confused with baseline dementia. Accepting sips of contrast from [place] with no complaints or concerns for aspiration.   Caregiver, Denise at bedside. Discussed plan of care including plan for repeat CT today to help determine steps moving forward. Reviewed labs. Answered questions and reassured of ongoing palliative support.   Length of Stay: 4  Current Medications: Scheduled Meds:  . enoxaparin (LOVENOX) injection  40 mg Subcutaneous Q24H    Continuous Infusions: . ceFEPime (MAXIPIME) IV 2 g (03/08/19 0622)  . dextrose 5 % lactated ringers with KCl/Additives Pediatric custom IV fluid 50 mL/hr at 03/07/19 2226  . potassium PHOSPHATE IVPB (mEq) 40 mEq (03/08/19 0945)    PRN Meds: acetaminophen **OR** acetaminophen, fentaNYL (SUBLIMAZE) injection, ondansetron **OR** ondansetron (ZOFRAN) IV  Physical Exam Vitals signs and nursing note reviewed.  Constitutional:      Appearance: She is cachectic. She is ill-appearing.  HENT:     Head: Normocephalic and atraumatic.  Pulmonary:     Effort: No tachypnea, accessory muscle usage or respiratory distress.  Abdominal:     Tenderness: There is abdominal tenderness.  Skin:    General: Skin is warm and dry.     Coloration: Skin is pale.  Neurological:     Mental Status: She is easily aroused.     Comments: Alert/disoriented with baseline  dementia  Psychiatric:        Attention and Perception: She is inattentive.        Speech: Speech is delayed.        Cognition and Memory: Cognition is impaired.            Vital Signs: BP 133/84 (BP Location: Right Arm)   Pulse 95   Temp 98.2 F (36.8 C) (Oral)   Resp 18   Ht 5\' 3"  (1.6 m)   Wt 79.8 kg   SpO2 96%   BMI 31.18 kg/m  SpO2: SpO2: 96 % O2 Device: O2  Device: Room Air O2 Flow Rate:    Intake/output summary:   Intake/Output Summary (Last 24 hours) at 03/08/2019 1355 Last data filed at 03/08/2019 1219 Gross per 24 hour  Intake 4420.54 ml  Output 750 ml  Net 3670.54 ml   LBM: Last BM Date: 03/04/19 Baseline Weight: Weight: 75.8 kg Most recent weight: Weight: 79.8 kg       Palliative Assessment/Data: PPS 30%   Flowsheet Rows     Most Recent Value  Intake Tab  Referral Department  Hospitalist  Unit at Time of Referral  Cardiac/Telemetry Unit  Palliative Care Primary Diagnosis  Cancer  Date Notified  03/05/19  Palliative Care Type  New Palliative care  Reason for referral  Clarify Goals of Care  Date of Admission  03/04/19  Date first seen by Palliative Care  03/05/19  # of days Palliative referral response time  0 Day(s)  # of days IP prior to Palliative referral  1  Clinical Assessment  Palliative Performance Scale Score  30%  Pain Max last 24 hours  Not able to report  Pain Min Last 24 hours  Not able to report  Dyspnea Max Last 24 Hours  Not able to report  Dyspnea Min Last 24 hours  Not able to report  Psychosocial & Spiritual Assessment  Palliative Care Outcomes      Patient Active Problem List   Diagnosis Date Noted  . Encounter for hospice care discussion   . SBO (small bowel obstruction) ([place]) 03/05/2019  . Perforation bowel ([place]) 03/05/2019  . Abdominal infection ([place]) 03/05/2019  . Palliative care encounter 03/05/2019  . Goals of care, counseling/discussion   . Palliative care by specialist   . Acute encephalopathy 03/04/2019  .  Syncope 06/09/2018  . New onset atrial fibrillation ([place]) 06/09/2018  . URI with cough and congestion 06/09/2018  . GIST (gastrointestinal stromal tumor) of small bowel, malignant ([place]) 01/12/2018  . Breast mass, right 11/29/2017  . Skin lesion of back 11/29/2017  . Difficulty walking 02/23/2017  . Sepsis ([place]) 02/17/2017  . Dementia, previously on Seroquel, able to wean with family and caregiver support 01/21/2017  . Abdominal mass, monitored by [facility] 01/21/2017  . NSVT (nonsustained ventricular tachycardia) ([place]) 05/11/2016  . Recurrent UTI, on daily Trimethroprim 02/01/2015  . Essential hypertension, on no medications 05/30/2013  . Coronary artery disease   . Diastolic dysfunction   . TIA (transient ischemic attack), questionable, 07/2010, with negative carotid dopplers   . History of GI bleed, with hemorrhoids, last colonoscopy 2012 at time of bleed   . Dyslipidemia, no statin   . Arthralgia of multiple joints 05/22/2009  . Vitamin D deficiency 01/27/2009    Palliative Care Assessment & Plan   Patient Profile: 85 y.o. female  with past medical history of dementia, GI ST cancer, CAD, diastolic dysfunction, hypertension, dyslipidemia, migraines, major depression, suicide attempts x2, admitted on 03/04/2019 with known abdominal mass containing gas inflammation and foci of free air, necrosis of the mass.   Assessment: Known abdominal necrotic mass with gas formation Partial small bowel obstruction Acute encephalopathy E. Coli bacteremia Enterococcus faecalis UTI Baseline dementia  Recommendations/Plan:  Continue current plan of care and watchful waiting for medical response to antibiotics.   Family confirms conservative measures including DNR and understanding that she is not a surgical candidate.  Discussed outpatient palliative vs. Hospice options pending clinical course.   PMT will follow inpatient. Appreciate oncology discussions with family regarding  diagnoses/prognosis.  Pending repeat CT this afternoon.  Code Status: DNR   Code Status Orders  (From admission, onward)         Start     Ordered   03/05/19 0056  Do not attempt resuscitation (DNR)  Continuous    Question Answer Comment  In the event of cardiac or respiratory ARREST Do not call a "code blue"   In the event of cardiac or respiratory ARREST Do not perform Intubation, CPR, defibrillation or ACLS   In the event of cardiac or respiratory ARREST Use medication by any route, position, wound care, and other measures to relive pain and suffering. May use oxygen, suction and manual treatment of airway obstruction as needed for comfort.      03/05/19 0056        Code Status History    Date Active Date Inactive Code Status Order ID Comments User Context   03/04/2019 2247 03/05/2019 0056 DNR [id number]  [name], MD ED   06/09/2018 2107 06/10/2018 1827 DNR [id number]  Vianne Bulls, MD ED   06/09/2018 2107 06/09/2018 2107 DNR [id number]  Vianne Bulls, MD ED   02/17/2017 1701 02/19/2017 1501 DNR [id number]  Caren Griffins, MD Inpatient   01/21/2017 2304 01/22/2017 2102 DNR [id number]  [name], MD Inpatient   05/06/2016 1739 05/08/2016 1454 DNR [id number]  Tonye Royalty, MD Inpatient   12/29/2015 1625 12/30/2015 2016 DNR [id number]  Willia Craze, NP Inpatient   02/01/2015 1226 02/05/2015 1547 DNR [id number]  Bonnielee Haff, MD Inpatient   11/01/2014 2136 11/04/2014 1648 DNR [id number]  Ma Hillock, DO Inpatient   Advance Care Planning Activity    Advance Directive Documentation     Most Recent Value  Type of Advance Directive  Healthcare Power of Attorney, Out of facility DNR (pink MOST or yellow form)  Pre-existing out of facility DNR order (yellow form or pink MOST form)  Physician notified to receive inpatient order, Pink MOST/Yellow Form most recent copy in chart - Physician notified to receive inpatient order  "MOST" Form in Place?  -        Prognosis:   Poor prognosis  Discharge Planning:  To Be Determined  Care plan was discussed with daughters Fraser Din and [place]) and caregiver Langley Gauss)  Thank you for allowing the Palliative Medicine Team to assist in the care of this patient.   Time In: 1335 Time Out: 1355 Total Time 20 Prolonged Time Billed  no      Greater than 50%  of this time was spent counseling and coordinating care related to the above assessment and plan.  Ihor Dow, DNP, FNP-C Palliative Medicine Team  Phone: [phone] Fax: [phone]  Please contact Palliative Medicine Team phone at [phone] for questions and concerns.

## 2019-03-08 NOTE — Progress Notes (Signed)
PROGRESS NOTE  Valerie Chen [id number] DOB: 21-Apr-1929 DOA: 03/04/2019 PCP: Briscoe Deutscher, DO  HPI/Recap of past 24 hours:  85 year old with past medical history of abdominal tomorrow thought to be GI ST, no biopsy done, family never pursue surgery or biopsy, dementia, coronary artery disease, diastolic dysfunction, A. fib was noted to have increase generalized weakness over the last 2 days.  She was treated for UTI with antibiotics.  Patient had an acute change in mental status and worsening abdominal pain and vomiting the day of admission.  Patient had a temperature of 101 at home.  Evaluation in the ED patient was found to be encephalopathic with minimal response, sodium 128 creatinine 0.9, lactate of 2.5, hemoglobin 14, white blood cell 5.7.  CT abdomen and pelvis showed possible necrosis of the tumor with possible perforation and small bowel obstruction. Initially family discussed with the ER physician who requested no active measure and no surgical intervention and keep patient in comfort.  Plan was to continue with IV antibiotics.  Patient family requested surgical evaluation.  Dr. Lucia Gaskins evaluated patient.  Patient is not a surgical candidate. Family would like to continue with IV antibiotics for now and and planning further discussion with Dr. Marin Olp and palliative care.  03/08/19: Patient was seen and examined at her bedside this morning.  Her private sitter at bedside with her.  More interactive.  Pain appears to be well-controlled on current pain management.  Will obtain CT abdomen and pelvis with contrast to further assess abdominal mass and partial small bowel obstruction.  Lactic acid and procalcitonin are reassuring.  Updated daughter via phone while in the room.   Assessment/Plan: Principal Problem:   Acute encephalopathy Active Problems:   Sepsis ([place])   SBO (small bowel obstruction) ([place])   Perforation bowel ([place])   Abdominal infection ([place])   Palliative care  encounter   Goals of care, counseling/discussion   Palliative care by specialist   Encounter for hospice care discussion  Known abdominal mass containing gas/inflammation/foci of free air  Necrosis of the mass with gas formation superimposed infection or involvement of bowel with perforation.  Dilated loop of small bowel within the left abdomen which may represent focal ileus or developing small bowel obstruction Abdominal x-ray done on 03/07/2019 shows increasing partial small bowel obstruction. Will obtain CT abdomen and pelvis with contrast to further assess abdominal mass and partial small bowel obstruction. Lactic acid and procalcitonin reassuring. Continue to optimize pain control Patient requests to eat per her personal sitter To start clear liquid diet as tolerated after CT abdomen and pelvis with contrast Seen by palliative care team and oncology Continue gentle IV fluid hydration Continue to monitor electrolytes and output Continue IV cefepime empirically for presumed intra-abdominal infection  Partial small bowel obstruction Management as stated above Currently n.p.o. Maintain potassium greater than 4.0 Mobilize if able and as tolerated  Acute metabolic acidosis in the setting of partial small bowel obstruction Chemistry bicarb improved after isotonic bicarb infusion Chemistry bicarb 23 on 03/08/2019 from 18 on 03/07/2019 Continue daily BMPs  Hypokalemia Potassium 3.3 Repleted with IV potassium supplement Goal potassium greater than 4.0 Continue D5 LR with KCl 10 mEq at 50 cc/h  Hypophosphatemia in the setting of poor oral intake Phosphorus 1.5 Repleted with K-Phos IV 40 mEq once Repeat phosphorus level in the morning  Acute metabolic encephalopathy likely multifactorial secondary to partial SBO versus E. coli bacteremia versus Enterococcus faecalis UTI versus known abdominal mass in the setting of dementia and advanced  age. Continue to treat underlying conditions  Reorient as necessary  Sepsis secondary to E. coli bacteremia and Enterococcus faecalis UTI Presented with tachycardia and tachypnea in the setting of bacteremia and UTI Management as stated below Continue to monitor fever curve and WBC Currently afebrile no leukocytosis Lactic acid 1.2 on 03/08/2019 Procalcitonin 0.79 on 03/08/2019 CBC reassuring SIRS criteria appears to be resolving on IV antibiotics  E. coli bacteremia likely from GI source Continue to treat underlying condition Blood cultures drawn on 03/04/2019 positive for E. 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 Marin Olp  Chronic A. fib not on oral anti-coagulation Rate is controlled  Goals of care Palliative care team following Discussed with daughter via phone on 03/08/2019.  She would like to continue treatment at this time and take it 1 day at a time.   DVT prophylaxis: SCDs, daily subcu Lovenox.  Code Status: DNR Family Communication: None at bedside Disposition Plan:  Continue IV antibiotics for now.  CT abdomen pelvis with contrast ordered on 03/08/19 and pending. Consultants:   Surgery  Palliative  Oncology  Procedures:   none  Antimicrobials:  Vancomycin 9-13 Cefepime 9/13>>    Objective: Vitals:   03/07/19 1512 03/07/19 1837 03/07/19 2026 03/08/19 0558  BP:  (!) 143/72 139/77 133/84  Pulse:  (!) 108 100 95  Resp:  18 20 18   Temp: 99.6 F (37.6 C) 97.9 F (36.6 C) 98 F (36.7 C) 98.2 F (36.8 C)  TempSrc: Axillary Oral Oral Oral  SpO2:  93% 94% 96%  Weight:    79.8 kg  Height:        Intake/Output Summary (Last 24 hours) at 03/08/2019 1135 Last data filed at  03/08/2019 0601 Gross per 24 hour  Intake 4420.54 ml  Output 500 ml  Net 3920.54 ml   Filed Weights   03/05/19 0249 03/05/19 2328 03/08/19 0558  Weight: 79.4 kg 79.1 kg 79.8 kg    Exam:  . General: 85 y.o. year-old female well-developed well-nourished no acute distress.  Somnolent but easily arousable to voices.   . Cardiovascular: Irregular rate and rhythm no rubs or gallops no JVD or thyromegaly noted.   Marland Kitchen Respiratory: Clear to auscultation no wheezes or rales.  Poor inspiratory effort.   . Abdomen: Soft nontender mildly distended hypoactive bowel sounds.  . Musculoskeletal: Trace lower extremity edema.  2 out of 4 pulses in all 4 extremities.   Marland Kitchen Psychiatry: Mood is appropriate for condition and setting.   Data Reviewed: CBC: Recent Labs  Lab 03/02/19 1245 03/04/19 1602 03/06/19 1052 03/07/19 0447 03/08/19 0447  WBC 11.6* 5.7 5.6 4.9 6.0  NEUTROABS 9.4* 5.0  --   --  4.5  HGB 15.2* 14.9 14.7 13.8 14.0  HCT 45.9 42.3 44.4 41.1 41.5  MCV 93.1 89.1 92.7 91.7 91.0  PLT 152 130* 151 161 [id number]   Basic Metabolic Panel: Recent Labs  Lab 03/02/19 1245 03/04/19 1602 03/06/19 1052 03/07/19 0447 03/08/19 0447  NA 135 128* 134* 135 139  K 3.8 3.8 4.0 3.6 3.3*  CL 101 95* 105 110 108  CO2 27 22 19* 18* 23  GLUCOSE 123* 148* 103* 99 136*  BUN 11 15 20 18 19   CREATININE 0.77 0.93 0.68 0.62 0.65  CALCIUM 9.2 8.8* 8.6* 8.1* 8.2*  MG  --   --   --   --  2.1  PHOS  --   --   --   --  1.5*   GFR: Estimated Creatinine Clearance: 48.7 mL/min (by C-G formula based on SCr of 0.65 mg/dL). Liver Function Tests: Recent Labs  Lab 03/02/19 1245 03/04/19 1602 03/08/19 0447  AST 23 29 21   ALT 27 34 22  ALKPHOS 68 75 75  BILITOT 1.6* 1.6* 1.1  PROT 6.5 5.7* 5.2*  ALBUMIN 3.7 3.0* 2.0*   No results for input(s): LIPASE, AMYLASE in the last 168 hours. No results for input(s): AMMONIA in the last 168 hours. Coagulation Profile: Recent Labs  Lab 03/04/19 1602  INR 1.1    Cardiac Enzymes: No results for input(s): CKTOTAL, CKMB, CKMBINDEX, TROPONINI in the last 168 hours. BNP (last 3 results) No results for input(s): PROBNP in the last 8760 hours. HbA1C: No results for input(s): HGBA1C in the last 72 hours. CBG: Recent Labs  Lab 03/07/19 1623 03/07/19 2028 03/07/19 2359 03/08/19 0410 03/08/19 0725  GLUCAP 96 106* 117* 129* 128*   Lipid Profile: No results for input(s): CHOL, HDL, LDLCALC, TRIG, CHOLHDL, LDLDIRECT in the last 72 hours. Thyroid Function Tests: No results for input(s): TSH, T4TOTAL, FREET4, T3FREE, THYROIDAB in the last 72 hours. Anemia Panel: No results for input(s): VITAMINB12, FOLATE, FERRITIN, TIBC, IRON, RETICCTPCT in the last 72 hours. Urine analysis:    Component Value Date/Time   COLORURINE AMBER (A) 03/04/2019 1740   APPEARANCEUR HAZY (A) 03/04/2019 1740   LABSPEC 1.012 03/04/2019 1740   PHURINE 5.0 03/04/2019 1740   GLUCOSEU NEGATIVE 03/04/2019 1740   GLUCOSEU NEGATIVE 12/26/2018 1055   HGBUR NEGATIVE 03/04/2019 1740   BILIRUBINUR NEGATIVE 03/04/2019 1740   BILIRUBINUR Negative 06/06/2018 1450   KETONESUR NEGATIVE 03/04/2019 1740   PROTEINUR 30 (A) 03/04/2019 1740   UROBILINOGEN 0.2 12/26/2018 1055   NITRITE NEGATIVE 03/04/2019 1740   LEUKOCYTESUR NEGATIVE 03/04/2019 1740   Sepsis Labs: @LABRCNTIP (procalcitonin:4,lacticidven:4)  ) Recent Results (from the past 240 hour(s))  Urine culture     Status: Abnormal   Collection Time: 03/02/19  3:01 PM   Specimen: Urine, Clean Catch  Result Value Ref Range Status   Specimen Description   Final    URINE, CLEAN CATCH Performed at [facility], [place] [address], [place], [place] [address]    Special Requests   Final    NONE Performed at [facility], [place] [address], [place], [place] [address]    Culture >=100,000 COLONIES/mL ENTEROCOCCUS FAECALIS (A)  Final   Report Status 03/05/2019 FINAL  Final   Organism ID, Bacteria  ENTEROCOCCUS FAECALIS (A)  Final      Susceptibility   Enterococcus faecalis - MIC*    AMPICILLIN <=2 SENSITIVE Sensitive     LEVOFLOXACIN 2 SENSITIVE Sensitive     NITROFURANTOIN <=16 SENSITIVE Sensitive     VANCOMYCIN 2 SENSITIVE Sensitive     * >=100,000 COLONIES/mL ENTEROCOCCUS FAECALIS  Blood Culture (routine x 2)     Status: Abnormal   Collection Time: 03/04/19  4:02 PM   Specimen: BLOOD  Result Value Ref Range Status   Specimen Description BLOOD LEFT ANTECUBITAL  Final   Special Requests   Final    BOTTLES DRAWN AEROBIC AND ANAEROBIC Blood Culture adequate volume   Culture  Setup Time   Final    GRAM NEGATIVE RODS ANAEROBIC BOTTLE ONLY CRITICAL RESULT CALLED TO, READ  BACK BY AND VERIFIED WITH: E PEREZ,PHARMD AT 1611 03/05/2019 BY L BENFIELD Performed at [facility], [place] [address], [place], [place] [address]    Culture ESCHERICHIA COLI (A)  Final   Report Status 03/07/2019 FINAL  Final   Organism ID, Bacteria ESCHERICHIA COLI  Final      Susceptibility   Escherichia coli - MIC*    AMPICILLIN >=32 RESISTANT Resistant     CEFAZOLIN >=64 RESISTANT Resistant     CEFEPIME <=1 SENSITIVE Sensitive     CEFTAZIDIME <=1 SENSITIVE Sensitive     CEFTRIAXONE <=1 SENSITIVE Sensitive     CIPROFLOXACIN <=0.25 SENSITIVE Sensitive     GENTAMICIN <=1 SENSITIVE Sensitive     IMIPENEM <=0.25 SENSITIVE Sensitive     TRIMETH/SULFA <=20 SENSITIVE Sensitive     AMPICILLIN/SULBACTAM >=32 RESISTANT Resistant     PIP/TAZO 8 SENSITIVE Sensitive     Extended ESBL NEGATIVE Sensitive     * ESCHERICHIA COLI  Urine culture     Status: None   Collection Time: 03/04/19  4:02 PM   Specimen: In/Out Cath Urine  Result Value Ref Range Status   Specimen Description IN/OUT CATH URINE  Final   Special Requests NONE  Final   Culture   Final    NO GROWTH Performed at [facility], [place] [address] [place], [place] [address]    Report Status 03/05/2019 FINAL  Final  Blood Culture ID Panel  (Reflexed)     Status: Abnormal   Collection Time: 03/04/19  4:02 PM  Result Value Ref Range Status   Enterococcus species NOT DETECTED NOT DETECTED Final   Listeria monocytogenes NOT DETECTED NOT DETECTED Final   Staphylococcus species NOT DETECTED NOT DETECTED Final   Staphylococcus aureus (BCID) NOT DETECTED NOT DETECTED Final   Streptococcus species NOT DETECTED NOT DETECTED Final   Streptococcus agalactiae NOT DETECTED NOT DETECTED Final   Streptococcus pneumoniae NOT DETECTED NOT DETECTED Final   Streptococcus pyogenes NOT DETECTED NOT DETECTED Final   Acinetobacter baumannii NOT DETECTED NOT DETECTED Final   Enterobacteriaceae species DETECTED (A) NOT DETECTED Final    Comment: Enterobacteriaceae represent a large family of gram-negative bacteria, not a single organism. CRITICAL RESULT CALLED TO, READ BACK BY AND VERIFIED WITH: E PEREZ,PHARMD AT 1611 03/05/2019 BY L BENFIELD    Enterobacter cloacae complex NOT DETECTED NOT DETECTED Final   Escherichia coli DETECTED (A) NOT DETECTED Final    Comment: CRITICAL RESULT CALLED TO, READ BACK BY AND VERIFIED WITH: E PEREZ,PHARMD AT 1611 03/05/2019 BY L BENFIELD    Klebsiella oxytoca NOT DETECTED NOT DETECTED Final   Klebsiella pneumoniae NOT DETECTED NOT DETECTED Final   Proteus species NOT DETECTED NOT DETECTED Final   Serratia marcescens NOT DETECTED NOT DETECTED Final   Carbapenem resistance NOT DETECTED NOT DETECTED Final   Haemophilus influenzae NOT DETECTED NOT DETECTED Final   Neisseria meningitidis NOT DETECTED NOT DETECTED Final   Pseudomonas aeruginosa NOT DETECTED NOT DETECTED Final   Candida albicans NOT DETECTED NOT DETECTED Final   Candida glabrata NOT DETECTED NOT DETECTED Final   Candida krusei NOT DETECTED NOT DETECTED Final   Candida parapsilosis NOT DETECTED NOT DETECTED Final   Candida tropicalis NOT DETECTED NOT DETECTED Final    Comment: Performed at [facility], [place] [address], [place], [place] [address]   SARS Coronavirus 2 [facility] order, Performed in [facility] hospital lab) Nasopharyngeal Nasopharyngeal Swab     Status: None   Collection Time: 03/04/19  4:39 PM   Specimen: Nasopharyngeal Swab  Result Value Ref Range Status   SARS Coronavirus 2 NEGATIVE NEGATIVE Final    Comment: (NOTE) If result is NEGATIVE SARS-CoV-2 target nucleic acids are NOT DETECTED. The SARS-CoV-2 RNA is generally detectable in upper and lower  respiratory specimens during the acute phase of infection. The lowest  concentration of SARS-CoV-2 viral copies this assay can detect is 250  copies / mL. A negative result does not preclude SARS-CoV-2 infection  and should not be used as the sole basis for treatment or other  patient management decisions.  A negative result may occur with  improper specimen collection / handling, submission of specimen other  than nasopharyngeal swab, presence of viral mutation(s) within the  areas targeted by this assay, and inadequate number of viral copies  (<250 copies / mL). A negative result must be combined with clinical  observations, patient history, and epidemiological information. If result is POSITIVE SARS-CoV-2 target nucleic acids are DETECTED. The SARS-CoV-2 RNA is generally detectable in upper and lower  respiratory specimens dur ing the acute phase of infection.  Positive  results are indicative of active infection with SARS-CoV-2.  Clinical  correlation with patient history and other diagnostic information is  necessary to determine patient infection status.  Positive results do  not rule out bacterial infection or co-infection with other viruses. If result is PRESUMPTIVE POSTIVE SARS-CoV-2 nucleic acids MAY BE PRESENT.   A presumptive positive result was obtained on the submitted specimen  and confirmed on repeat testing.  While 2019 novel coronavirus  (SARS-CoV-2) nucleic acids may be present in the submitted sample  additional confirmatory testing may be  necessary for epidemiological  and / or clinical management purposes  to differentiate between  SARS-CoV-2 and other Sarbecovirus currently known to infect humans.  If clinically indicated additional testing with an alternate test  methodology [phone]) is advised. The SARS-CoV-2 RNA is generally  detectable in upper and lower respiratory sp ecimens during the acute  phase of infection. The expected result is Negative. Fact Sheet for Patients:  StrictlyIdeas.no Fact Sheet for Healthcare Providers: BankingDealers.co.za This test is not yet approved or cleared by the [place] FDA and has been authorized for detection and/or diagnosis of SARS-CoV-2 by FDA under an Emergency Use Authorization (EUA).  This EUA will remain in effect (meaning this test can be used) for the duration of the COVID-19 declaration under Section 564(b)(1) of the Act, 21 U.S.C. section 360bbb-3(b)(1), unless the authorization is terminated or revoked sooner. Performed at [facility], [place] [address] [place], [place] [address]   Blood Culture (routine x 2)     Status: None (Preliminary result)   Collection Time: 03/05/19 12:26 AM   Specimen: BLOOD  Result Value Ref Range Status   Specimen Description BLOOD RIGHT ARM  Final   Special Requests   Final    BOTTLES DRAWN AEROBIC ONLY Blood Culture adequate volume   Culture   Final    NO GROWTH 3 DAYS Performed at [facility], [address], [place],  [address]    Report Status PENDING  Incomplete      Studies: No results found.  Scheduled Meds: . enoxaparin (LOVENOX) injection  40 mg Subcutaneous Q24H    Continuous Infusions: . ceFEPime (MAXIPIME) IV 2 g (03/08/19 0622)  . dextrose 5 % lactated ringers with KCl/Additives Pediatric custom IV fluid 50 mL/hr at 03/07/19 2226  . potassium PHOSPHATE IVPB (mEq) 40 mEq (03/08/19 0945)  LOS: 4 days     Kayleen Memos, MD [facility] Pager [phone]  If 7PM-7AM, please contact night-coverage [email or web address] Password [facility] 03/08/2019, 11:35 AM

## 2019-03-09 ENCOUNTER — Telehealth: Payer: Self-pay | Admitting: Physical Therapy

## 2019-03-09 DIAGNOSIS — R1900 Intra-abdominal and pelvic swelling, mass and lump, unspecified site: Secondary | ICD-10-CM

## 2019-03-09 LAB — BASIC METABOLIC PANEL
Anion gap: 9 (ref 5–15)
BUN: 17 mg/dL (ref 8–23)
CO2: 22 mmol/L (ref 22–32)
Calcium: 8.6 mg/dL — ABNORMAL LOW (ref 8.9–10.3)
Chloride: 107 mmol/L (ref 98–111)
Creatinine, Ser: 0.49 mg/dL (ref 0.44–1.00)
GFR calc Af Amer: >60 mL/min (ref >60)
GFR calc non Af Amer: >60 mL/min (ref >60)
Glucose, Bld: 138 mg/dL — ABNORMAL HIGH (ref 70–99)
Potassium: 3.8 mmol/L (ref 3.5–5.1)
Sodium: 138 mmol/L (ref 135–145)

## 2019-03-09 LAB — LACTIC ACID, PLASMA: Lactic Acid, Venous: 1 mmol/L (ref 0.5–1.9)

## 2019-03-09 LAB — PROCALCITONIN: Procalcitonin: 0.52 ng/mL

## 2019-03-09 MED ORDER — SODIUM CHLORIDE 0.9 % IV SOLN
8.0000 mg | Freq: Three times a day (TID) | INTRAVENOUS | Status: DC
  Administered 2019-03-09 – 2019-03-12 (×9): 8 mg via INTRAVENOUS
  Filled 2019-03-09 (×13): qty 4

## 2019-03-09 MED ORDER — OCTREOTIDE ACETATE 50 MCG/ML IJ SOLN
100.0000 ug | Freq: Two times a day (BID) | INTRAMUSCULAR | Status: AC
  Administered 2019-03-09 – 2019-03-10 (×3): 100 ug via INTRAVENOUS
  Filled 2019-03-09 (×4): qty 2

## 2019-03-09 MED ORDER — LORAZEPAM 2 MG/ML IJ SOLN: 0.5000 mg | INTRAMUSCULAR | Status: DC | PRN

## 2019-03-09 MED ORDER — FENTANYL CITRATE (PF) 100 MCG/2ML IJ SOLN
25.0000 ug | INTRAMUSCULAR | Status: DC | PRN
  Administered 2019-03-09 – 2019-03-10 (×5): 75 ug via INTRAVENOUS
  Administered 2019-03-11: 25 ug via INTRAVENOUS
  Administered 2019-03-11 – 2019-03-12 (×3): 75 ug via INTRAVENOUS
  Administered 2019-03-12 (×3): 50 ug via INTRAVENOUS
  Filled 2019-03-09 (×12): qty 2

## 2019-03-09 MED ORDER — GLYCOPYRROLATE 0.2 MG/ML IJ SOLN: 0.2000 mg | INTRAMUSCULAR | Status: DC | PRN

## 2019-03-09 NOTE — Progress Notes (Signed)
SLP Cancellation Note  Patient Details Name: Valerie Chen MRN: [id number] DOB: [birth year]   Cancelled treatment:        SLP has been following along - note that there is confirmation of bowel obstruction and pt is now having some vomiting. The plan appears to be to transition to hospice facility. SLP to sign off for now - comfort feeds of ice chips and sips of water are in place. Would defer advancement to MD, particularly since she is having vomiting. Please reorder if we can assist further.    Venita Sheffield [name] 03/09/2019, 8:44 AM  Pollyann Glen, M.A. [place] Acute Environmental education officer [phone] Office [phone]

## 2019-03-09 NOTE — Progress Notes (Addendum)
PROGRESS NOTE  Valerie Chen [id number] DOB: 1929/04/25 DOA: 03/04/2019 PCP: Briscoe Deutscher, DO  HPI/Recap of past 78 hours:  85 year old with past medical history of abdominal tomorrow thought to be GI ST, no biopsy done, family never pursue surgery or biopsy, dementia, coronary artery disease, diastolic dysfunction, A. fib was noted to have increase generalized weakness over the last 2 days.  She was treated for UTI with antibiotics.  Patient had an acute change in mental status and worsening abdominal pain and vomiting the day of admission.  Patient had a temperature of 101 at home.  Evaluation in the ED patient was found to be encephalopathic with minimal response, sodium 128 creatinine 0.9, lactate of 2.5, hemoglobin 14, white blood cell 5.7.  CT abdomen and pelvis showed possible necrosis of the tumor with possible perforation and small bowel obstruction. Initially family discussed with the ER physician who requested no active measure and no surgical intervention and keep patient in comfort.  Plan was to continue with IV antibiotics.  Patient family requested surgical evaluation.  Dr. Lucia Gaskins evaluated patient.  Patient is not a surgical candidate. Family would like to continue with IV antibiotics for now and and planning further discussion with Dr. Marin Olp and palliative care.  03/08/19: Patient was seen and examined at her bedside this morning.  Her private sitter at bedside with her.  More interactive.  Pain appears to be well-controlled on current pain management.  Will obtain CT abdomen and pelvis with contrast to further assess abdominal mass and partial small bowel obstruction.  Lactic acid and procalcitonin are reassuring.  Updated daughter via phone while in the room.  03/09/19: Patient was seen and examined with her personal sitter at bedside.  CT abdomen and pelvis with contrast revealed worsening findings, abdominal mass, and suspicion for small bowel obstruction.  Very poor  prognosis.  Patient's daughters made decision for comfort care only.   Assessment/Plan: Principal Problem:   Acute encephalopathy Active Problems:   Sepsis ([place])   SBO (small bowel obstruction) ([place])   Perforation bowel ([place])   Abdominal infection ([place])   Palliative care encounter   Goals of care, counseling/discussion   Palliative care by specialist   Terminal care  Known abdominal mass containing gas/inflammation/foci of free air  Necrosis of the mass with gas formation superimposed infection or involvement of bowel with perforation.  Dilated loop of small bowel within the left abdomen which may represent focal ileus or developing small bowel obstruction Abdominal x-ray done on 03/07/2019 shows increasing partial small bowel obstruction. Personally reviewed CT abdomen and pelvis with contrast done on 03/08/2019 which showed worsening abdominal mass with small bowel obstruction and bilateral pleural effusion right greater than left.   Very poor prognosis Family made decision to withhold care that would prolong life.  Patient is comfort care only.  Partial small bowel obstruction Management as stated above Currently n.p.o. Maintain potassium greater than 4.0 Mobilize if able and as tolerated  Acute metabolic acidosis in the setting of partial small bowel obstruction Chemistry bicarb improved after isotonic bicarb infusion Chemistry bicarb 23 on 03/08/2019 from 18 on 03/07/2019 Continue daily BMPs  Hypokalemia Potassium 3.3 Repleted with IV potassium supplement Goal potassium greater than 4.0 Continue D5 LR with KCl 10 mEq at 50 cc/h  Hypophosphatemia in the setting of poor oral intake Phosphorus 1.5 Repleted with K-Phos IV 40 mEq once Repeat phosphorus level in the morning  Acute metabolic encephalopathy likely multifactorial secondary to partial SBO versus E. coli bacteremia versus  Enterococcus faecalis UTI versus known abdominal mass in the setting of dementia and advanced  age. Continue to treat underlying conditions Reorient as necessary  Sepsis secondary to E. coli bacteremia and Enterococcus faecalis UTI Presented with tachycardia and tachypnea in the setting of bacteremia and UTI Management as stated below Continue to monitor fever curve and WBC Currently afebrile no leukocytosis Lactic acid 1.2 on 03/08/2019 Procalcitonin 0.79 on 03/08/2019 CBC reassuring SIRS criteria appears to be resolving on IV antibiotics  E. coli bacteremia likely from GI source Continue to treat underlying condition Blood cultures drawn on 03/04/2019 positive for E. 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 Marin Olp  Chronic A. fib not on oral anti-coagulation Rate is controlled  Goals of care Palliative care team following Discussed with daughter via phone on 03/08/2019.  She would like to continue treatment at this time and take it 1 day at a time. Family made decision for comfort care only on 03/09/2019.  All focus is on comfort only.   Risks: Patient is at high risk for decompensation due to withholding all care that would prolong life and focus on comfort care only.   DVT prophylaxis: SCDs, daily subcu Lovenox.  Code Status: DNR Family Communication: None at bedside Disposition Plan:  Will discharge to [facility] once bed is available. Consultants:   Surgery  Palliative  Oncology  Procedures:   none  Antimicrobials:  Vancomycin 9-13 Cefepime 9/13>>    Objective: Vitals:   03/08/19 1424 03/08/19 2135 03/09/19 0526 03/09/19 0900  BP: 122/66 (!) 148/91 (!) 169/99 130/84  Pulse: 95 (!) 109 66 96  Resp: 18 18 18 18   Temp:  98.4 F (36.9 C) 98.2 F (36.8 C) 98.3 F (36.8 C) 98.6 F (37 C)  TempSrc: Axillary Oral Axillary Axillary  SpO2: 94% 94% 94% 93%  Weight:      Height:        Intake/Output Summary (Last 24 hours) at 03/09/2019 1422 Last data filed at 03/09/2019 0601 Gross per 24 hour  Intake 407.72 ml  Output --  Net 407.72 ml   Filed Weights   03/05/19 0249 03/05/19 2328 03/08/19 0558  Weight: 79.4 kg 79.1 kg 79.8 kg    Exam:   General: 85 y.o. year-old female pleasantly demented in no acute distress.  Somnolent but easily arousable to voices.    Cardiovascular: Irregular rate and rhythm no rubs or gallops no JVD or thyromegaly noted.    Respiratory: Clear to Auscultation No Wheezes or Rales.  Poor inspiratory effort.    Abdomen: Mildly distended with hypoactive bowel sounds.  Musculoskeletal: Trace lower extremity edema.    Psychiatry: Mood is appropriate for condition and setting.   Data Reviewed: CBC: Recent Labs  Lab 03/04/19 1602 03/06/19 1052 03/07/19 0447 03/08/19 0447  WBC 5.7 5.6 4.9 6.0  NEUTROABS 5.0  --   --  4.5  HGB 14.9 14.7 13.8 14.0  HCT 42.3 44.4 41.1 41.5  MCV 89.1 92.7 91.7 91.0  PLT 130* 151 161 [id number]   Basic Metabolic Panel: Recent Labs  Lab 03/04/19 1602 03/06/19 1052 03/07/19 0447 03/08/19 0447 03/09/19 0804  NA 128* 134* 135 139 138  K 3.8 4.0 3.6 3.3* 3.8  CL 95* 105 110 108 107  CO2 22 19* 18* 23 22  GLUCOSE 148* 103* 99 136* 138*  BUN 15 20 18 19 17   CREATININE 0.93 0.68 0.62 0.65 0.49  CALCIUM 8.8* 8.6* 8.1* 8.2* 8.6*  MG  --   --   --  2.1  --   PHOS  --   --   --  1.5*  --    GFR: Estimated Creatinine Clearance: 48.7 mL/min (by C-G formula based on SCr of 0.49 mg/dL). Liver Function Tests: Recent Labs  Lab 03/04/19 1602 03/08/19 0447  AST 29 21  ALT 34 22  ALKPHOS 75 75  BILITOT 1.6* 1.1  PROT 5.7* 5.2*  ALBUMIN 3.0* 2.0*   No results for input(s): LIPASE, AMYLASE in the last 168 hours. No results for input(s):  AMMONIA in the last 168 hours. Coagulation Profile: Recent Labs  Lab 03/04/19 1602  INR 1.1   Cardiac Enzymes: No results for input(s): CKTOTAL, CKMB, CKMBINDEX, TROPONINI in the last 168 hours. BNP (last 3 results) No results for input(s): PROBNP in the last 8760 hours. HbA1C: No results for input(s): HGBA1C in the last 72 hours. CBG: Recent Labs  Lab 03/07/19 1623 03/07/19 2028 03/07/19 2359 03/08/19 0410 03/08/19 0725  GLUCAP 96 106* 117* 129* 128*   Lipid Profile: No results for input(s): CHOL, HDL, LDLCALC, TRIG, CHOLHDL, LDLDIRECT in the last 72 hours. Thyroid Function Tests: No results for input(s): TSH, T4TOTAL, FREET4, T3FREE, THYROIDAB in the last 72 hours. Anemia Panel: No results for input(s): VITAMINB12, FOLATE, FERRITIN, TIBC, IRON, RETICCTPCT in the last 72 hours. Urine analysis:    Component Value Date/Time   COLORURINE AMBER (A) 03/04/2019 1740   APPEARANCEUR HAZY (A) 03/04/2019 1740   LABSPEC 1.012 03/04/2019 1740   PHURINE 5.0 03/04/2019 1740   GLUCOSEU NEGATIVE 03/04/2019 1740   GLUCOSEU NEGATIVE 12/26/2018 1055   HGBUR NEGATIVE 03/04/2019 1740   BILIRUBINUR NEGATIVE 03/04/2019 1740   BILIRUBINUR Negative 06/06/2018 1450   KETONESUR NEGATIVE 03/04/2019 1740   PROTEINUR 30 (A) 03/04/2019 1740   UROBILINOGEN 0.2 12/26/2018 1055   NITRITE NEGATIVE 03/04/2019 1740   LEUKOCYTESUR NEGATIVE 03/04/2019 1740   Sepsis Labs: @LABRCNTIP (procalcitonin:4,lacticidven:4)  ) Recent Results (from the past 240 hour(s))  Urine culture     Status: Abnormal   Collection Time: 03/02/19  3:01 PM   Specimen: Urine, Clean Catch  Result Value Ref Range Status   Specimen Description   Final    URINE, CLEAN CATCH Performed at [facility], [place] [address], [place], [place] [address]    Special Requests   Final    NONE Performed at [facility], [place] [address] [place], [place] [address]    Culture >=100,000 COLONIES/mL  ENTEROCOCCUS FAECALIS (A)  Final   Report Status 03/05/2019 FINAL  Final   Organism ID, Bacteria ENTEROCOCCUS FAECALIS (A)  Final      Susceptibility   Enterococcus faecalis - MIC*    AMPICILLIN <=2 SENSITIVE Sensitive     LEVOFLOXACIN 2 SENSITIVE Sensitive     NITROFURANTOIN <=16 SENSITIVE Sensitive     VANCOMYCIN 2 SENSITIVE Sensitive     * >=100,000 COLONIES/mL ENTEROCOCCUS FAECALIS  Blood Culture (routine x 2)     Status: Abnormal   Collection Time: 03/04/19  4:02 PM   Specimen: BLOOD  Result Value Ref Range Status   Specimen Description BLOOD LEFT ANTECUBITAL  Final   Special Requests   Final    BOTTLES DRAWN AEROBIC AND ANAEROBIC Blood Culture adequate volume   Culture  Setup Time   Final    GRAM NEGATIVE RODS ANAEROBIC BOTTLE ONLY CRITICAL RESULT CALLED TO, READ BACK BY AND VERIFIED WITH: E PEREZ,PHARMD AT 1611 03/05/2019 BY L BENFIELD Performed at [facility], [address] [place], [place] [address]    Culture ESCHERICHIA COLI (A)  Final   Report Status 03/07/2019 FINAL  Final   Organism ID, Bacteria ESCHERICHIA COLI  Final      Susceptibility   Escherichia coli - MIC*    AMPICILLIN >=32 RESISTANT Resistant     CEFAZOLIN >=64 RESISTANT Resistant     CEFEPIME <=1 SENSITIVE Sensitive     CEFTAZIDIME <=1 SENSITIVE Sensitive     CEFTRIAXONE <=1 SENSITIVE Sensitive     CIPROFLOXACIN <=0.25 SENSITIVE Sensitive     GENTAMICIN <=1 SENSITIVE Sensitive     IMIPENEM <=0.25 SENSITIVE Sensitive     TRIMETH/SULFA <=20 SENSITIVE Sensitive     AMPICILLIN/SULBACTAM >=32 RESISTANT Resistant     PIP/TAZO 8 SENSITIVE Sensitive     Extended ESBL NEGATIVE Sensitive     * ESCHERICHIA COLI  Urine culture     Status: None   Collection Time: 03/04/19  4:02 PM   Specimen: In/Out Cath Urine  Result Value Ref Range Status   Specimen Description IN/OUT CATH URINE  Final   Special Requests NONE  Final   Culture   Final    NO GROWTH Performed at [facility], [place] [address], [place], [place] [address]    Report Status 03/05/2019 FINAL  Final  Blood Culture ID Panel (Reflexed)     Status: Abnormal   Collection Time: 03/04/19  4:02 PM  Result Value Ref Range Status   Enterococcus species NOT DETECTED NOT DETECTED Final   Listeria monocytogenes NOT DETECTED NOT DETECTED Final   Staphylococcus species NOT DETECTED NOT DETECTED Final   Staphylococcus aureus (BCID) NOT DETECTED NOT DETECTED Final   Streptococcus species NOT DETECTED NOT DETECTED Final   Streptococcus agalactiae NOT DETECTED NOT DETECTED Final   Streptococcus pneumoniae NOT DETECTED NOT DETECTED Final   Streptococcus pyogenes NOT DETECTED NOT DETECTED Final   Acinetobacter baumannii NOT DETECTED NOT DETECTED Final   Enterobacteriaceae species DETECTED (A) NOT DETECTED Final    Comment: Enterobacteriaceae represent a large family of gram-negative bacteria, not a single organism. CRITICAL RESULT CALLED TO, READ BACK BY AND VERIFIED WITH: E PEREZ,PHARMD AT 1611 03/05/2019 BY L BENFIELD    Enterobacter cloacae complex NOT DETECTED NOT DETECTED Final   Escherichia coli DETECTED (A) NOT DETECTED Final    Comment: CRITICAL RESULT CALLED TO, READ BACK BY AND VERIFIED WITH: E PEREZ,PHARMD AT 1611 03/05/2019 BY L BENFIELD    Klebsiella oxytoca NOT DETECTED NOT DETECTED Final   Klebsiella pneumoniae NOT DETECTED NOT DETECTED Final   Proteus species NOT DETECTED NOT DETECTED Final   Serratia marcescens NOT DETECTED NOT DETECTED Final   Carbapenem resistance NOT DETECTED NOT DETECTED Final   Haemophilus influenzae NOT DETECTED NOT DETECTED Final   Neisseria meningitidis NOT DETECTED NOT DETECTED Final   Pseudomonas aeruginosa NOT DETECTED NOT DETECTED Final   Candida albicans NOT DETECTED NOT DETECTED Final   Candida glabrata NOT DETECTED NOT DETECTED Final   Candida krusei NOT DETECTED NOT DETECTED Final   Candida parapsilosis NOT DETECTED NOT DETECTED Final   Candida tropicalis NOT DETECTED NOT DETECTED  Final    Comment: Performed at [facility], [place] [address] [place], [place] [address]  SARS Coronavirus 2 [facility] order, Performed  in [place] lab) Nasopharyngeal Nasopharyngeal Swab     Status: None   Collection Time: 03/04/19  4:39 PM   Specimen: Nasopharyngeal Swab  Result Value Ref Range Status   SARS Coronavirus 2 NEGATIVE NEGATIVE Final    Comment: (NOTE) If result is NEGATIVE SARS-CoV-2 target nucleic acids are NOT DETECTED. The SARS-CoV-2 RNA is generally detectable in upper and lower  respiratory specimens during the acute phase of infection. The lowest  concentration of SARS-CoV-2 viral copies this assay can detect is 250  copies / mL. A negative result does not preclude SARS-CoV-2 infection  and should not be used as the sole basis for treatment or other  patient management decisions.  A negative result may occur with  improper specimen collection / handling, submission of specimen other  than nasopharyngeal swab, presence of viral mutation(s) within the  areas targeted by this assay, and inadequate number of viral copies  (<250 copies / mL). A negative result must be combined with clinical  observations, patient history, and epidemiological information. If result is POSITIVE SARS-CoV-2 target nucleic acids are DETECTED. The SARS-CoV-2 RNA is generally detectable in upper and lower  respiratory specimens dur ing the acute phase of infection.  Positive  results are indicative of active infection with SARS-CoV-2.  Clinical  correlation with patient history and other diagnostic information is  necessary to determine patient infection status.  Positive results do  not rule out bacterial infection or co-infection with other viruses. If result is PRESUMPTIVE POSTIVE SARS-CoV-2 nucleic acids MAY BE PRESENT.   A presumptive positive result was obtained on the submitted specimen  and confirmed on repeat testing.  While 2019 novel coronavirus  (SARS-CoV-2)  nucleic acids may be present in the submitted sample  additional confirmatory testing may be necessary for epidemiological  and / or clinical management purposes  to differentiate between  SARS-CoV-2 and other Sarbecovirus currently known to infect humans.  If clinically indicated additional testing with an alternate test  methodology [phone]) is advised. The SARS-CoV-2 RNA is generally  detectable in upper and lower respiratory sp ecimens during the acute  phase of infection. The expected result is Negative. Fact Sheet for Patients:  StrictlyIdeas.no Fact Sheet for Healthcare Providers: BankingDealers.co.za This test is not yet approved or cleared by the [place] FDA and has been authorized for detection and/or diagnosis of SARS-CoV-2 by FDA under an Emergency Use Authorization (EUA).  This EUA will remain in effect (meaning this test can be used) for the duration of the COVID-19 declaration under Section 564(b)(1) of the Act, 21 U.S.C. section 360bbb-3(b)(1), unless the authorization is terminated or revoked sooner. Performed at [facility], [place] [address], [place], [place] [address]   Blood Culture (routine x 2)     Status: None (Preliminary result)   Collection Time: 03/05/19 12:26 AM   Specimen: BLOOD  Result Value Ref Range Status   Specimen Description BLOOD RIGHT ARM  Final   Special Requests   Final    BOTTLES DRAWN AEROBIC ONLY Blood Culture adequate volume   Culture   Final    NO GROWTH 4 DAYS Performed at [facility], [place] [address], [place], [place] [address]    Report Status PENDING  Incomplete      Studies: Ct Abdomen Pelvis W Contrast  Result Date: 03/08/2019 CLINICAL DATA:  Palpable abdominal mass, non pulsatile EXAM: CT ABDOMEN AND PELVIS WITH CONTRAST TECHNIQUE: Multidetector CT imaging of the abdomen and pelvis was performed using  the standard protocol following bolus administration of  intravenous contrast. CONTRAST:  18mL OMNIPAQUE IOHEXOL 300 MG/ML  SOLN COMPARISON:  Most recent CT abdomen pelvis 03/04/2019 FINDINGS: Lower chest: Increasing right pleural effusion with now complete collapse of the right lower lobe and some adjacent passive atelectasis in the right middle lobe and right upper lobe. Slight interval increase in the size of the left pleural effusion as well. With adjacent passive atelectasis. Some hypoattenuation is present within the atelectatic lung suspicious for underlying infection. Mild cardiomegaly with right atrial enlargement. Atherosclerotic calcification of the coronary arteries. Hepatobiliary: Redemonstrated punctate calcifications are present within the posterior right lobe liver small amount of hypoattenuation is associated with the larger calcifications seen medially which is increasing in conspicuity since exams from 2018 and 2019. No other focal liver abnormality. Normal gallbladder. Mild prominence of the biliary tree may be related to senescent change. Pancreas: Unremarkable. No pancreatic ductal dilatation or surrounding inflammatory changes. Spleen: Normal in size without focal abnormality. Adrenals/Urinary Tract: Adrenal glands are unremarkable. Kidneys are normal, without renal calculi, focal lesion, or hydronephrosis. Bladder is unremarkable. Stomach/Bowel: Small hiatal hernia. Stomach is distended with air and ingested contrast medium. There is only partial contrast passage through the small bowel with a multitude of air and fluid distended small bowel loops in the upper abdomen. Dilation is seen to the level of the large, vascular air and fluid containing mid abdominal mass measuring 7.6 x 11.2 cm, slightly increased in size from prior when measuring at a similar level. Extensive surrounding stranding is present with free fluid and punctate foci of gas within the adjacent mesenteric leaflets. More distal small bowel is largely decompressed. There is portion  of the transverse colon which directly abuts this lesion and loses a clear discernible fat plane (3/61) concerning for direct involvement of the bowel wall. Vascular/Lymphatic: Mild aortic atherosclerosis. Scattered reactive lymph nodes are present in the mesentery. No clearly discernible pathologically enlarged nodes. Reproductive: Uterus is surgically absent. No concerning adnexal lesions. Retained ovaries without concerning lesion. Other: Collection of fluid in the deep pelvis appears to be developing some peripheral rim enhancement concerning for potential loculation or abscess formation. Additional reactive free fluid is seen in the mesentery with several scattered foci of extraluminal gas. There is increasing body wall edema. Subcutaneous air in the right lower quadrant may be related to injectable use. Musculoskeletal: Multilevel degenerative changes are present in the imaged portions of the spine. No acute osseous abnormality or suspicious osseous lesion. IMPRESSION: 1. Slight interval increase in size of the large, vascular air and fluid containing mid abdominal mass, now measuring up to 11.2 cm, with extensive surrounding stranding and free fluid. Suspect at least some of this changes related to the increasing fluid within the mass suggesting fistulization to the bowel 2. Mass results in upstream small bowel obstruction. 3. Portion of the transverse colon which directly abuts this lesion and loses a clear discernible fat plane concerning for direct involvement of the bowel wall. 4. Irregular calcified foci in the liver have some increasing conspicuity since 2018. Unclear if this could reflect malignant or metastatic involvement given the presence of the mid abdominal mass. 5. Collection of fluid in the deep pelvis appears to be developing some peripheral rim enhancement concerning for potential loculation or abscess formation. 6. Increasing pleural effusions now moderate on the right with complete collapse  of the right lower lobe. Some hypoattenuation is present within the atelectatic lung in the left lung base suspicious for superimposed infection or  sequela of aspiration. 7. Aortic Atherosclerosis (ICD10-I70.0). These results will be called to the ordering clinician or representative by the Radiologist Assistant, and communication documented in the PACS or zVision Dashboard. Electronically Signed   By: Lovena Le M.D.   On: 03/08/2019 22:47    Scheduled Meds:  octreotide  100 mcg Intravenous Q12H    Continuous Infusions:  dextrose 5 % lactated ringers with KCl/Additives Pediatric custom IV fluid 50 mL/hr at 03/09/19 1225   ondansetron (ZOFRAN) IV 8 mg (03/09/19 1101)     LOS: 5 days     Kayleen Memos, MD [facility] Pager [phone]  If 7PM-7AM, please contact night-coverage [email or web address] Password [email or web address] 03/09/2019, 2:22 PM

## 2019-03-09 NOTE — Telephone Encounter (Signed)
Copied from [place] [id number]. Topic: General - Other >> Mar 09, 2019  1:33 PM Celene Kras A wrote: Reason for CRM: Pts daughter called stating her mother is not doing well and that she will most likely going into hospice. She states that pts cancer doctor is giving her less than a week to live. Pts daughter called and is requesting to know if PCP is in the office so that she can bring a gift. Please advise.

## 2019-03-09 NOTE — Progress Notes (Signed)
Unfortunately, I have a sense that we now know where we are headed with Valerie Chen.  She is having some vomiting this morning.  I suspect this probably is related to her bowel obstruction.  She had a CT scan done yesterday.  She does have a bowel obstruction.  Looks like she has obvious invasion of her bowel by this malignancy.  I think that we now are looking at getting her over to [facility] for comfort care and quality of life.  I talked to her daughter, Valerie Chen, last night.  We talked about the CAT scan and seeing what the results show.  We talked about [employer] [facility].  Valerie Chen would be very appropriate for [facility].  The family would not want to have an NG tube placed into her.  I will see about having her on some form of anti-emetic coverage to try to help minimize her nausea.  Sometimes, octreotide can help with bowel obstruction.  Her prealbumin is only 5.1.  I think this goes quite well with her status and the decline in her status.  I doubt that Valerie Chen will survive more than 3 or 4 weeks at this point.  I really do not think that the infection as she has is that much of an issue.  I probably would stop the antibiotics.  Again, our goal here is comfort.  Hospice is clearly the way to go.  I will call over to [facility] today and see what the bed situation is like.  I am sure we can get her over to [facility] this weekend.  I will talk to her daughter again today.  I appreciate everybody's help with Valerie Chen.  I know that the staff on [facility] are doing a great job with her.  Lattie Haw, MD  Oswaldo Milian 41:10

## 2019-03-09 NOTE — TOC Initial Note (Addendum)
Transition of Care [facility]) - Initial/Assessment Note    Patient Details  Name: Valerie Chen MRN: [id number] Date of Birth: 1928-08-12  Transition of Care [facility]) CM/SW Contact:    Bartholomew Crews, RN Phone Number: [phone] 03/09/2019, 11:44 AM  Clinical Narrative:                 CM has been following at a distance. Chart reviewed. Oncology and palliative notes reviewed. Oncology to refer to [facility]. CM notified hospice liaison for [facility] of referral being made.   Update: Received call from hospice social worker that bed is not available today, but will follow for possible weekend transition.   Expected Discharge Plan: [place]     Patient Goals and CMS Choice   CMS Medicare.gov Compare Post Acute Care list provided to:: Patient Represenative (must comment) Choice offered to / list presented to : Adult Children  Expected Discharge Plan and Services Expected Discharge Plan: [place] In-house Referral: Hospice / Palliative Care Discharge Planning Services: CM Consult Post Acute Care Choice: Hospice   Expected Discharge Date: 03/07/19               DME Arranged: N/A DME Agency: NA       HH Arranged: NA HH Agency: NA        Prior Living Arrangements/Services                       Activities of Daily Living Home Assistive Devices/Equipment: Bedside commode/3-in-1, Blood pressure cuff, Eyeglasses, Grab bars around toilet, Hand-held shower hose, Grab bars in shower, Scales, Raised toilet seat with rails, Other (Comment), Shower chair without back(TRANSPORT CHAIR) ADL Screening (condition at time of admission) Patient's cognitive ability adequate to safely complete daily activities?: No Is the patient deaf or have difficulty hearing?: No Does the patient have difficulty seeing, even when wearing glasses/contacts?: No Does the patient have difficulty concentrating, remembering, or making decisions?: Yes Patient able to express need for  assistance with ADLs?: No Does the patient have difficulty dressing or bathing?: Yes Independently performs ADLs?: No Communication: Needs assistance Is this a change from baseline?: Change from baseline, expected to last >3 days Dressing (OT): Dependent Is this a change from baseline?: Change from baseline, expected to last >3 days Grooming: Dependent Is this a change from baseline?: Change from baseline, expected to last >3 days Feeding: Dependent Is this a change from baseline?: Change from baseline, expected to last >3 days Bathing: Dependent Is this a change from baseline?: Change from baseline, expected to last >3 days Toileting: Dependent Is this a change from baseline?: Change from baseline, expected to last >3days In/Out Bed: Dependent Is this a change from baseline?: Change from baseline, expected to last >3 days Walks in Home: Dependent Is this a change from baseline?: Change from baseline, expected to last >3 days Does the patient have difficulty walking or climbing stairs?: Yes Weakness of Legs: Right Weakness of Arms/Hands: Right  Permission Sought/Granted                  Emotional Assessment         Alcohol / Substance Use: Not Applicable Psych Involvement: No (comment)  Admission diagnosis:  Abdominal infection ([place]) [K65.9] Altered mental status, unspecified altered mental status type [R41.82] Sepsis, due to unspecified organism, unspecified whether acute organ dysfunction present [facility]) [A41.9] Patient Active Problem List   Diagnosis Date Noted  . Terminal care   . SBO (small  bowel obstruction) ([place]) 03/05/2019  . Perforation bowel ([place]) 03/05/2019  . Abdominal infection ([place]) 03/05/2019  . Palliative care encounter 03/05/2019  . Goals of care, counseling/discussion   . Palliative care by specialist   . Acute encephalopathy 03/04/2019  . Syncope 06/09/2018  . New onset atrial fibrillation ([place]) 06/09/2018  . URI with cough and congestion  06/09/2018  . GIST (gastrointestinal stromal tumor) of small bowel, malignant ([place]) 01/12/2018  . Breast mass, right 11/29/2017  . Skin lesion of back 11/29/2017  . Difficulty walking 02/23/2017  . Sepsis ([place]) 02/17/2017  . Dementia, previously on Seroquel, able to wean with family and caregiver support 01/21/2017  . Abdominal mass, monitored by [facility] 01/21/2017  . NSVT (nonsustained ventricular tachycardia) ([place]) 05/11/2016  . Recurrent UTI, on daily Trimethroprim 02/01/2015  . Essential hypertension, on no medications 05/30/2013  . Coronary artery disease   . Diastolic dysfunction   . TIA (transient ischemic attack), questionable, 07/2010, with negative carotid dopplers   . History of GI bleed, with hemorrhoids, last colonoscopy 2012 at time of bleed   . Dyslipidemia, no statin   . Arthralgia of multiple joints 05/22/2009  . Vitamin D deficiency 01/27/2009   PCP:  Briscoe Deutscher, DO Pharmacy:   [id number] - [place], [place] [address] Phone: [phone] Fax: [phone]     Social Determinants of Health (SDOH) Interventions    Readmission Risk Interventions No flowsheet data found.

## 2019-03-09 NOTE — Care Management Important Message (Signed)
Important Message  Patient Details  Name: Valerie Chen MRN: [id number] Date of Birth: 06/01/1929   Medicare Important Message Given:  Yes     Memory Argue 03/09/2019, 3:33 PM

## 2019-03-09 NOTE — Progress Notes (Signed)
Palliative Medicine RN Note: Rec'd call from daughter Kieth Brightly and had 2 long conversations regarding d/c planning/hospice and medications.   Family is clear that they cannot, at this time, consider hospice homes outside of [place], and home hospice is not an option for their family. We will continue to wait for [facility] bed. Family feels very comfortable on [facility], and they agree that the care Mrs Grigg is receiving there is very good. They would prefer to not move to another unit within [facility].  Updated PMT NP Jinny Blossom, who will see Mrs Maly again tomorrow.  Marjie Skiff Lua Feng, RN, BSN, [facility] Palliative Medicine Team 03/09/2019 1:39 PM Office [phone]

## 2019-03-09 NOTE — Progress Notes (Signed)
Daily Progress Note   Patient Name: Valerie Chen       Date: 03/09/2019 DOB: 1928/10/20  Age: 85 y.o. MRN#: [id number] Attending Physician: Kayleen Memos, DO Primary Care Physician: Briscoe Deutscher, DO Admit Date: 03/04/2019  Reason for Consultation/Follow-up: Establishing goals of care  Subjective: Patient resting. She appears comfortable this morning. Recently given prn fentanyl. Projectile vomiting this AM when Dr. Marin Olp at bedside. Octreotide and Zofran scheduled.  Caregiver, Denise at bedside. Daughters, Kieth Brightly and Fraser Din have spoken with Dr. Marin Olp and understand worsening CT results and symptoms. Daughters wish for comfort and symptom management. They wish to pursue hospice facility placement, preferably [facility]. Daughters and caregiver understand poor prognosis.   Explained to [facility] comfort focused pathway and role of symptom management to ensure comfort, peace, and dignity at EOL. Educated on EOL expectations. (comfort measures were explained to daughters the other day also). Langley Gauss denies questions or concerns about plan of care and understands she can call PMT phone if Kieth Brightly or Fraser Din have questions for me.   Emotional support provided.   Length of Stay: 5  Current Medications: Scheduled Meds:  . octreotide  100 mcg Intravenous Q12H    Continuous Infusions: . dextrose 5 % lactated ringers with KCl/Additives Pediatric custom IV fluid 50 mL/hr at 03/07/19 2226  . ondansetron (ZOFRAN) IV      PRN Meds: acetaminophen **OR** acetaminophen, fentaNYL (SUBLIMAZE) injection, glycopyrrolate, LORazepam, ondansetron **OR** [DISCONTINUED] ondansetron (ZOFRAN) IV  Physical Exam Vitals signs and nursing note reviewed.  Constitutional:      Appearance: She is cachectic. She is  ill-appearing.  HENT:     Head: Normocephalic and atraumatic.  Pulmonary:     Effort: No tachypnea, accessory muscle usage or respiratory distress.  Abdominal:     Tenderness: There is abdominal tenderness.  Skin:    General: Skin is warm and dry.     Coloration: Skin is pale.  Neurological:     Mental Status: She is easily aroused.     Comments: Drowsy, disoriented  Psychiatric:        Attention and Perception: She is inattentive.        Speech: Speech is delayed.        Cognition and Memory: Cognition is impaired.            Vital Signs:  BP 130/84 (BP Location: Left Arm)   Pulse 96   Temp 98.6 F (37 C) (Axillary)   Resp 18   Ht 5\' 3"  (1.6 m)   Wt 79.8 kg   SpO2 93%   BMI 31.18 kg/m  SpO2: SpO2: 93 % O2 Device: O2 Device: Room Air O2 Flow Rate:    Intake/output summary:   Intake/Output Summary (Last 24 hours) at 03/09/2019 1033 Last data filed at 03/09/2019 0601 Gross per 24 hour  Intake 407.72 ml  Output 250 ml  Net 157.72 ml   LBM: Last BM Date: 03/04/19 Baseline Weight: Weight: 75.8 kg Most recent weight: Weight: 79.8 kg       Palliative Assessment/Data: PPS 20%   Flowsheet Rows     Most Recent Value  Intake Tab  Referral Department  Hospitalist  Unit at Time of Referral  Cardiac/Telemetry Unit  Palliative Care Primary Diagnosis  Cancer  Date Notified  03/05/19  Palliative Care Type  New Palliative care  Reason for referral  Clarify Goals of Care  Date of Admission  03/04/19  Date first seen by Palliative Care  03/05/19  # of days Palliative referral response time  0 Day(s)  # of days IP prior to Palliative referral  1  Clinical Assessment  Palliative Performance Scale Score  20%  Pain Max last 24 hours  Not able to report  Pain Min Last 24 hours  Not able to report  Dyspnea Max Last 24 Hours  Not able to report  Dyspnea Min Last 24 hours  Not able to report  Psychosocial & Spiritual Assessment  Palliative Care Outcomes  Palliative Care  Outcomes  Clarified goals of care, Counseled regarding hospice, Provided end of life care assistance, Provided psychosocial or spiritual support, Improved pain interventions, Improved non-pain symptom therapy, Transitioned to hospice, Changed to focus on comfort      Patient Active Problem List   Diagnosis Date Noted  . Encounter for hospice care discussion   . SBO (small bowel obstruction) ([place]) 03/05/2019  . Perforation bowel ([place]) 03/05/2019  . Abdominal infection ([place]) 03/05/2019  . Palliative care encounter 03/05/2019  . Goals of care, counseling/discussion   . Palliative care by specialist   . Acute encephalopathy 03/04/2019  . Syncope 06/09/2018  . New onset atrial fibrillation ([place]) 06/09/2018  . URI with cough and congestion 06/09/2018  . GIST (gastrointestinal stromal tumor) of small bowel, malignant ([place]) 01/12/2018  . Breast mass, right 11/29/2017  . Skin lesion of back 11/29/2017  . Difficulty walking 02/23/2017  . Sepsis ([place]) 02/17/2017  . Dementia, previously on Seroquel, able to wean with family and caregiver support 01/21/2017  . Abdominal mass, monitored by [facility] 01/21/2017  . NSVT (nonsustained ventricular tachycardia) ([place]) 05/11/2016  . Recurrent UTI, on daily Trimethroprim 02/01/2015  . Essential hypertension, on no medications 05/30/2013  . Coronary artery disease   . Diastolic dysfunction   . TIA (transient ischemic attack), questionable, 07/2010, with negative carotid dopplers   . History of GI bleed, with hemorrhoids, last colonoscopy 2012 at time of bleed   . Dyslipidemia, no statin   . Arthralgia of multiple joints 05/22/2009  . Vitamin D deficiency 01/27/2009    Palliative Care Assessment & Plan   Patient Profile: 85 y.o. female  with past medical history of dementia, GI ST cancer, CAD, diastolic dysfunction, hypertension, dyslipidemia, migraines, major depression, suicide attempts x2, admitted on 03/04/2019 with known abdominal mass containing gas  inflammation and foci of free air, necrosis of  the mass.   Assessment: Known abdominal necrotic mass with gas formation Partial small bowel obstruction Acute encephalopathy E. Coli bacteremia Enterococcus faecalis UTI Baseline dementia  Recommendations/Plan:  Worsening CT scan and clinical status. Dr. Marin Olp spoke with daughters this AM and plan is to shift to comfort measures and pursue [facility] for EOL care.   Symptom management medications on MAR. For now, daughters would like to continue prn fentanyl. If symptoms worsen, recommend switching to Morphine or Dilaudid.   SW consult for hospice facility placement.  Comfort feeds per patient/family request. Recommend ice chips or liquids.  Code Status: DNR   Code Status Orders  (From admission, onward)         Start     Ordered   03/05/19 0056  Do not attempt resuscitation (DNR)  Continuous    Question Answer Comment  In the event of cardiac or respiratory ARREST Do not call a "code blue"   In the event of cardiac or respiratory ARREST Do not perform Intubation, CPR, defibrillation or ACLS   In the event of cardiac or respiratory ARREST Use medication by any route, position, wound care, and other measures to relive pain and suffering. May use oxygen, suction and manual treatment of airway obstruction as needed for comfort.      03/05/19 0056        Code Status History    Date Active Date Inactive Code Status Order ID Comments User Context   03/04/2019 2247 03/05/2019 0056 DNR [id number]  [name], MD ED   06/09/2018 2107 06/10/2018 1827 DNR [id number]  Vianne Bulls, MD ED   06/09/2018 2107 06/09/2018 2107 DNR [id number]  Vianne Bulls, MD ED   02/17/2017 1701 02/19/2017 1501 DNR [id number]  Caren Griffins, MD Inpatient   01/21/2017 2304 01/22/2017 2102 DNR [id number]  [name], MD Inpatient   05/06/2016 1739 05/08/2016 1454 DNR [id number]  Tonye Royalty, MD Inpatient   12/29/2015 1625 12/30/2015  2016 DNR [id number]  Willia Craze, NP Inpatient   02/01/2015 1226 02/05/2015 1547 DNR [id number]  Bonnielee Haff, MD Inpatient   11/01/2014 2136 11/04/2014 1648 DNR [id number]  Ma Hillock, DO Inpatient   Advance Care Planning Activity    Advance Directive Documentation     Most Recent Value  Type of Advance Directive  Healthcare Power of Attorney, Out of facility DNR (pink MOST or yellow form)  Pre-existing out of facility DNR order (yellow form or pink MOST form)  Physician notified to receive inpatient order, Pink MOST/Yellow Form most recent copy in chart - Physician notified to receive inpatient order  "MOST" Form in Place?  -       Prognosis:   Poor prognosis likely days  Discharge Planning:  Hospice facility vs hospital death if further decline inpatient.  Care plan was discussed with daughters Fraser Din and Kieth Brightly) and caregiver Langley Gauss)  Thank you for allowing the Palliative Medicine Team to assist in the care of this patient.   Time In: 1005 Time Out: 1030 Total Time 25 Prolonged Time Billed  no      Greater than 50%  of this time was spent counseling and coordinating care related to the above assessment and plan.  Ihor Dow, DNP, FNP-C Palliative Medicine Team  Phone: [phone] Fax: [phone]  Please contact Palliative Medicine Team phone at [phone] for questions and concerns.

## 2019-03-09 NOTE — Progress Notes (Addendum)
Scientist, water quality Place referral confirmed by Dr. Marin Olp, patient's daughter and [facility] Manager Ellard Artis. Appreciate report from Palliative Medicine Team.  Eligibility confirmed. Unfortunately [facility] is not able to offer a room today. Family is aware. Will continue to follow, update staff and family with changes in availability.  Thank you,  Erling Conte, LCSW [phone]

## 2019-03-09 NOTE — Telephone Encounter (Signed)
Called and spoke to [place].  Informed her that Dr. Juleen China is not in the office today but that she is scheduled to be in the office next Tuesday, Wednesday and Friday.

## 2019-03-10 LAB — CULTURE, BLOOD (ROUTINE X 2)
Culture: NO GROWTH
Special Requests: ADEQUATE

## 2019-03-10 NOTE — Progress Notes (Signed)
PROGRESS NOTE  Valerie Chen [id number] DOB: Nov 11, 1928 DOA: 03/04/2019 PCP: Briscoe Deutscher, DO  HPI/Recap of past 40 hours:  85 year old with past medical history of abdominal tomorrow thought to be GI ST, no biopsy done, family never pursue surgery or biopsy, dementia, coronary artery disease, diastolic dysfunction, A. fib was noted to have increase generalized weakness over the last 2 days.  She was treated for UTI with antibiotics.  Patient had an acute change in mental status and worsening abdominal pain and vomiting the day of admission.  Patient had a temperature of 101 at home.  Evaluation in the ED patient was found to be encephalopathic with minimal response, sodium 128 creatinine 0.9, lactate of 2.5, hemoglobin 14, white blood cell 5.7.  CT abdomen and pelvis showed possible necrosis of the tumor with possible perforation and small bowel obstruction. Initially family discussed with the ER physician who requested no active measure and no surgical intervention and keep patient in comfort.  Plan was to continue with IV antibiotics.  Patient family requested surgical evaluation.  Dr. Lucia Gaskins evaluated patient.  Patient is not a surgical candidate. Family would like to continue with IV antibiotics for now and and planning further discussion with Dr. Marin Olp and palliative care.  03/08/19: Patient was seen and examined at her bedside this morning.  Her private sitter at bedside with her.  More interactive.  Pain appears to be well-controlled on current pain management.  Will obtain CT abdomen and pelvis with contrast to further assess abdominal mass and partial small bowel obstruction.  Lactic acid and procalcitonin are reassuring.  Updated daughter via phone while in the room.  03/09/19: Patient was seen and examined with her personal sitter at bedside.  CT abdomen and pelvis with contrast revealed worsening findings, abdominal mass, and suspicion for small bowel obstruction.  Very poor  prognosis.  Patient's daughters made decision for comfort care only.  03/10/19: Patient was seen and examined at her bedside with her private sitter present.  She appears comfortable.  Awaiting placement at [facility].   Assessment/Plan: Principal Problem:   Acute encephalopathy Active Problems:   Sepsis ([place])   SBO (small bowel obstruction) ([place])   Perforation bowel ([place])   Abdominal infection ([place])   Palliative care encounter   Goals of care, counseling/discussion   Palliative care by specialist   Terminal care  Known abdominal mass containing gas/inflammation/foci of free air  Necrosis of the mass with gas formation superimposed infection or involvement of bowel with perforation.  Dilated loop of small bowel within the left abdomen which may represent focal ileus or developing small bowel obstruction Abdominal x-ray done on 03/07/2019 shows increasing partial small bowel obstruction. Personally reviewed CT abdomen and pelvis with contrast done on 03/08/2019 which showed worsening abdominal mass with small bowel obstruction and bilateral pleural effusion right greater than left.   Very poor prognosis Family made decision to withhold care that would prolong life.  Patient is comfort care only.  Partial small bowel obstruction Management as stated above Currently n.p.o. Maintain potassium greater than 4.0 Mobilize if able and as tolerated  Acute metabolic acidosis in the setting of partial small bowel obstruction Chemistry bicarb improved after isotonic bicarb infusion Chemistry bicarb 23 on 03/08/2019 from 18 on 03/07/2019 Continue daily BMPs  Hypokalemia Potassium 3.3 Repleted with IV potassium supplement Goal potassium greater than 4.0 Continue D5 LR with KCl 10 mEq at 50 cc/h  Hypophosphatemia in the setting of poor oral intake Phosphorus 1.5 Repleted with K-Phos  IV 40 mEq once Repeat phosphorus level in the morning  Acute metabolic encephalopathy likely multifactorial  secondary to partial SBO versus E. coli bacteremia versus Enterococcus faecalis UTI versus known abdominal mass in the setting of dementia and advanced age. Continue to treat underlying conditions Reorient as necessary  Sepsis secondary to E. coli bacteremia and Enterococcus faecalis UTI Presented with tachycardia and tachypnea in the setting of bacteremia and UTI Management as stated below Continue to monitor fever curve and WBC Currently afebrile no leukocytosis Lactic acid 1.2 on 03/08/2019 Procalcitonin 0.79 on 03/08/2019 CBC reassuring SIRS criteria appears to be resolving on IV antibiotics  E. coli bacteremia likely from GI source Continue to treat underlying condition Blood cultures drawn on 03/04/2019 positive for E. 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 Marin Olp  Chronic A. fib not on oral anti-coagulation Rate is controlled  Goals of care Palliative care team following Discussed with daughter via phone on 03/08/2019.  She would like to continue treatment at this time and take it 1 day at a time. Family made decision for comfort care only on 03/09/2019.  All focus is on comfort only.   Risks: Patient is at high risk for decompensation due to withholding all care that would prolong life and focus on comfort care only.   DVT prophylaxis: SCDs, daily subcu Lovenox.  Code Status: DNR Family Communication: None at bedside Disposition Plan:  Will discharge to [facility] once bed is available. Consultants:   Surgery  Palliative  Oncology  Procedures:   none  Antimicrobials:  Vancomycin 9-13 Cefepime 9/13>>    Objective: Vitals:    03/09/19 0900 03/09/19 2120 03/10/19 0449 03/10/19 0900  BP: 130/84 126/78 124/75 130/74  Pulse: 96 (!) 111 82 80  Resp: 18 18 18 18   Temp: 98.6 F (37 C) 99.6 F (37.6 C) 98.5 F (36.9 C) 97.9 F (36.6 C)  TempSrc: Axillary Oral Oral Axillary  SpO2: 93% 91% 93% 93%  Weight:      Height:        Intake/Output Summary (Last 24 hours) at 03/10/2019 1637 Last data filed at 03/10/2019 0800 Gross per 24 hour  Intake 986.71 ml  Output 800 ml  Net 186.71 ml   Filed Weights   03/05/19 0249 03/05/19 2328 03/08/19 0558  Weight: 79.4 kg 79.1 kg 79.8 kg    Exam:  . General: 85 y.o. year-old female frail-appearing no acute distress.  Lethargic.   . Cardiovascular: Irregular rate and rhythm no rubs or gallops no JVD or thyromegaly noted.   Marland Kitchen Respiratory: Clear to auscultation no wheezes no rales.  Poor inspiratory effort.   . Abdomen: Distended with no noted bowel sounds. . Musculoskeletal: Trace lower extremity edema.  Dependent edema in upper extremities. Marland Kitchen Psychiatry: Unable to assess mood due to lethargy.   Data Reviewed: CBC: Recent Labs  Lab 03/04/19 1602 03/06/19 1052 03/07/19 0447 03/08/19 0447  WBC 5.7 5.6 4.9 6.0  NEUTROABS 5.0  --   --  4.5  HGB 14.9 14.7 13.8 14.0  HCT 42.3 44.4 41.1 41.5  MCV 89.1 92.7 91.7 91.0  PLT 130* 151 161 [id number]   Basic Metabolic Panel: Recent Labs  Lab 03/04/19 1602 03/06/19 1052 03/07/19 0447 03/08/19 0447 03/09/19 [id number]  NA 128* 134* 135 139 138  K 3.8 4.0 3.6 3.3* 3.8  CL 95* 105 110 108 107  CO2 22 19* 18* 23 22  GLUCOSE 148* 103* 99 136* 138*  BUN 15 20 18 19 17   CREATININE 0.93 0.68 0.62 0.65 0.49  CALCIUM 8.8* 8.6* 8.1* 8.2* 8.6*  MG  --   --   --  2.1  --   PHOS  --   --   --  1.5*  --    GFR: Estimated Creatinine Clearance: 48.7 mL/min (by C-G formula based on SCr of 0.49 mg/dL). Liver Function Tests: Recent Labs  Lab 03/04/19 1602 03/08/19 0447  AST 29 21  ALT 34 22  ALKPHOS 75 75  BILITOT 1.6* 1.1  PROT  5.7* 5.2*  ALBUMIN 3.0* 2.0*   No results for input(s): LIPASE, AMYLASE in the last 168 hours. No results for input(s): AMMONIA in the last 168 hours. Coagulation Profile: Recent Labs  Lab 03/04/19 1602  INR 1.1   Cardiac Enzymes: No results for input(s): CKTOTAL, CKMB, CKMBINDEX, TROPONINI in the last 168 hours. BNP (last 3 results) No results for input(s): PROBNP in the last 8760 hours. HbA1C: No results for input(s): HGBA1C in the last 72 hours. CBG: Recent Labs  Lab 03/07/19 1623 03/07/19 2028 03/07/19 2359 03/08/19 0410 03/08/19 0725  GLUCAP 96 106* 117* 129* 128*   Lipid Profile: No results for input(s): CHOL, HDL, LDLCALC, TRIG, CHOLHDL, LDLDIRECT in the last 72 hours. Thyroid Function Tests: No results for input(s): TSH, T4TOTAL, FREET4, T3FREE, THYROIDAB in the last 72 hours. Anemia Panel: No results for input(s): VITAMINB12, FOLATE, FERRITIN, TIBC, IRON, RETICCTPCT in the last 72 hours. Urine analysis:    Component Value Date/Time   COLORURINE AMBER (A) 03/04/2019 1740   APPEARANCEUR HAZY (A) 03/04/2019 1740   LABSPEC 1.012 03/04/2019 1740   PHURINE 5.0 03/04/2019 1740   GLUCOSEU NEGATIVE 03/04/2019 1740   GLUCOSEU NEGATIVE 12/26/2018 1055   HGBUR NEGATIVE 03/04/2019 1740   BILIRUBINUR NEGATIVE 03/04/2019 1740   BILIRUBINUR Negative 06/06/2018 1450   KETONESUR NEGATIVE 03/04/2019 1740   PROTEINUR 30 (A) 03/04/2019 1740   UROBILINOGEN 0.2 12/26/2018 1055   NITRITE NEGATIVE 03/04/2019 1740   LEUKOCYTESUR NEGATIVE 03/04/2019 1740   Sepsis Labs: @LABRCNTIP (procalcitonin:4,lacticidven:4)  ) Recent Results (from the past 240 hour(s))  Urine culture     Status: Abnormal   Collection Time: 03/02/19  3:01 PM   Specimen: Urine, Clean Catch  Result Value Ref Range Status   Specimen Description   Final    URINE, CLEAN CATCH Performed at [facility], [place] [address], [place], [place] [address]    Special Requests   Final    NONE  Performed at [facility], [place] [address], [place], [place] [address]    Culture >=100,000 COLONIES/mL ENTEROCOCCUS FAECALIS (A)  Final   Report Status 03/05/2019 FINAL  Final   Organism ID, Bacteria ENTEROCOCCUS FAECALIS (A)  Final      Susceptibility   Enterococcus faecalis - MIC*    AMPICILLIN <=2 SENSITIVE Sensitive     LEVOFLOXACIN 2 SENSITIVE Sensitive     NITROFURANTOIN <=16 SENSITIVE Sensitive     VANCOMYCIN 2 SENSITIVE Sensitive     * >=100,000 COLONIES/mL ENTEROCOCCUS FAECALIS  Blood Culture (routine x 2)     Status: Abnormal   Collection Time: 03/04/19  4:02 PM   Specimen: BLOOD  Result Value Ref Range Status   Specimen Description BLOOD LEFT ANTECUBITAL  Final  Special Requests   Final    BOTTLES DRAWN AEROBIC AND ANAEROBIC Blood Culture adequate volume   Culture  Setup Time   Final    GRAM NEGATIVE RODS ANAEROBIC BOTTLE ONLY CRITICAL RESULT CALLED TO, READ BACK BY AND VERIFIED WITH: E PEREZ,PHARMD AT 1611 03/05/2019 BY L BENFIELD Performed at [facility], [place] [address], [place], [place] [address]    Culture ESCHERICHIA COLI (A)  Final   Report Status 03/07/2019 FINAL  Final   Organism ID, Bacteria ESCHERICHIA COLI  Final      Susceptibility   Escherichia coli - MIC*    AMPICILLIN >=32 RESISTANT Resistant     CEFAZOLIN >=64 RESISTANT Resistant     CEFEPIME <=1 SENSITIVE Sensitive     CEFTAZIDIME <=1 SENSITIVE Sensitive     CEFTRIAXONE <=1 SENSITIVE Sensitive     CIPROFLOXACIN <=0.25 SENSITIVE Sensitive     GENTAMICIN <=1 SENSITIVE Sensitive     IMIPENEM <=0.25 SENSITIVE Sensitive     TRIMETH/SULFA <=20 SENSITIVE Sensitive     AMPICILLIN/SULBACTAM >=32 RESISTANT Resistant     PIP/TAZO 8 SENSITIVE Sensitive     Extended ESBL NEGATIVE Sensitive     * ESCHERICHIA COLI  Urine culture     Status: None   Collection Time: 03/04/19  4:02 PM   Specimen: In/Out Cath Urine  Result Value Ref Range Status   Specimen Description IN/OUT CATH  URINE  Final   Special Requests NONE  Final   Culture   Final    NO GROWTH Performed at [facility], [place] [address] [place], [place] [address]    Report Status 03/05/2019 FINAL  Final  Blood Culture ID Panel (Reflexed)     Status: Abnormal   Collection Time: 03/04/19  4:02 PM  Result Value Ref Range Status   Enterococcus species NOT DETECTED NOT DETECTED Final   Listeria monocytogenes NOT DETECTED NOT DETECTED Final   Staphylococcus species NOT DETECTED NOT DETECTED Final   Staphylococcus aureus (BCID) NOT DETECTED NOT DETECTED Final   Streptococcus species NOT DETECTED NOT DETECTED Final   Streptococcus agalactiae NOT DETECTED NOT DETECTED Final   Streptococcus pneumoniae NOT DETECTED NOT DETECTED Final   Streptococcus pyogenes NOT DETECTED NOT DETECTED Final   Acinetobacter baumannii NOT DETECTED NOT DETECTED Final   Enterobacteriaceae species DETECTED (A) NOT DETECTED Final    Comment: Enterobacteriaceae represent a large family of gram-negative bacteria, not a single organism. CRITICAL RESULT CALLED TO, READ BACK BY AND VERIFIED WITH: E PEREZ,PHARMD AT 1611 03/05/2019 BY L BENFIELD    Enterobacter cloacae complex NOT DETECTED NOT DETECTED Final   Escherichia coli DETECTED (A) NOT DETECTED Final    Comment: CRITICAL RESULT CALLED TO, READ BACK BY AND VERIFIED WITH: E PEREZ,PHARMD AT 1611 03/05/2019 BY L BENFIELD    Klebsiella oxytoca NOT DETECTED NOT DETECTED Final   Klebsiella pneumoniae NOT DETECTED NOT DETECTED Final   Proteus species NOT DETECTED NOT DETECTED Final   Serratia marcescens NOT DETECTED NOT DETECTED Final   Carbapenem resistance NOT DETECTED NOT DETECTED Final   Haemophilus influenzae NOT DETECTED NOT DETECTED Final   Neisseria meningitidis NOT DETECTED NOT DETECTED Final   Pseudomonas aeruginosa NOT DETECTED NOT DETECTED Final   Candida albicans NOT DETECTED NOT DETECTED Final   Candida glabrata NOT DETECTED NOT DETECTED Final   Candida krusei NOT  DETECTED NOT DETECTED Final   Candida parapsilosis NOT DETECTED NOT DETECTED Final   Candida tropicalis NOT DETECTED NOT DETECTED Final  Comment: Performed at [facility], [place] [address] [place], [place] [address]  SARS Coronavirus 2 [facility] order, Performed in [facility] hospital lab) Nasopharyngeal Nasopharyngeal Swab     Status: None   Collection Time: 03/04/19  4:39 PM   Specimen: Nasopharyngeal Swab  Result Value Ref Range Status   SARS Coronavirus 2 NEGATIVE NEGATIVE Final    Comment: (NOTE) If result is NEGATIVE SARS-CoV-2 target nucleic acids are NOT DETECTED. The SARS-CoV-2 RNA is generally detectable in upper and lower  respiratory specimens during the acute phase of infection. The lowest  concentration of SARS-CoV-2 viral copies this assay can detect is 250  copies / mL. A negative result does not preclude SARS-CoV-2 infection  and should not be used as the sole basis for treatment or other  patient management decisions.  A negative result may occur with  improper specimen collection / handling, submission of specimen other  than nasopharyngeal swab, presence of viral mutation(s) within the  areas targeted by this assay, and inadequate number of viral copies  (<250 copies / mL). A negative result must be combined with clinical  observations, patient history, and epidemiological information. If result is POSITIVE SARS-CoV-2 target nucleic acids are DETECTED. The SARS-CoV-2 RNA is generally detectable in upper and lower  respiratory specimens dur ing the acute phase of infection.  Positive  results are indicative of active infection with SARS-CoV-2.  Clinical  correlation with patient history and other diagnostic information is  necessary to determine patient infection status.  Positive results do  not rule out bacterial infection or co-infection with other viruses. If result is PRESUMPTIVE POSTIVE SARS-CoV-2 nucleic acids MAY BE PRESENT.   A presumptive  positive result was obtained on the submitted specimen  and confirmed on repeat testing.  While 2019 novel coronavirus  (SARS-CoV-2) nucleic acids may be present in the submitted sample  additional confirmatory testing may be necessary for epidemiological  and / or clinical management purposes  to differentiate between  SARS-CoV-2 and other Sarbecovirus currently known to infect humans.  If clinically indicated additional testing with an alternate test  methodology [phone]) is advised. The SARS-CoV-2 RNA is generally  detectable in upper and lower respiratory sp ecimens during the acute  phase of infection. The expected result is Negative. Fact Sheet for Patients:  StrictlyIdeas.no Fact Sheet for Healthcare Providers: BankingDealers.co.za This test is not yet approved or cleared by the [place] FDA and has been authorized for detection and/or diagnosis of SARS-CoV-2 by FDA under an Emergency Use Authorization (EUA).  This EUA will remain in effect (meaning this test can be used) for the duration of the COVID-19 declaration under Section 564(b)(1) of the Act, 21 U.S.C. section 360bbb-3(b)(1), unless the authorization is terminated or revoked sooner. Performed at [facility], [place] [address], [place], [place] [address]   Blood Culture (routine x 2)     Status: None   Collection Time: 03/05/19 12:26 AM   Specimen: BLOOD  Result Value Ref Range Status   Specimen Description BLOOD RIGHT ARM  Final   Special Requests   Final    BOTTLES DRAWN AEROBIC ONLY Blood Culture adequate volume   Culture   Final    NO GROWTH 5 DAYS Performed at [facility], [address] [place],  [address]    Report Status 03/10/2019 FINAL  Final      Studies: No results found.  Scheduled Meds:   Continuous Infusions: . ondansetron (ZOFRAN) IV 8 mg (  03/10/19 1523)     LOS: 6 days     Kayleen Memos, MD [facility]  Pager [phone]  If 7PM-7AM, please contact night-coverage [email or web address] Password [facility] 03/10/2019, 4:37 PM

## 2019-03-10 NOTE — Progress Notes (Signed)
Daily Progress Note   Patient Name: Valerie Chen       Date: 03/10/2019 DOB: August 28, 1928  Age: 85 y.o. MRN#: [id number] Attending Physician: Kayleen Memos, DO Primary Care Physician: Briscoe Deutscher, DO Admit Date: 03/04/2019  Reason for Consultation/Follow-up: Establishing goals of care  Subjective/GOC: Patient resting. She appears comfortable this morning. Recently given prn fentanyl prior to bath this morning.   Caregiver, Denise at bedside. Also spoke with daughter, Fraser Din via telephone.   Patient has generalized edema, likely from ongoing IVF. Dr. Nevada Crane has d/c'd IVF's. Discussed with Langley Gauss and Fraser Din reasoning for discontinuing IVF's at this point, with fear this will cause more discomfort as she gets closer to EOL. Pat tearful but understands and agrees with this plan.   Discussed ongoing symptom management medications.   Discussed plan for pending hospice facility transfer. Waiting on a bed. Stable for transfer today if bed available.   Family has PMT contact information and understands to call with questions or concerns.    Length of Stay: 6  Current Medications: Scheduled Meds:  . octreotide  100 mcg Intravenous Q12H    Continuous Infusions: . ondansetron (ZOFRAN) IV 8 mg (03/10/19 0751)    PRN Meds: acetaminophen **OR** acetaminophen, fentaNYL (SUBLIMAZE) injection, glycopyrrolate, LORazepam, ondansetron **OR** [DISCONTINUED] ondansetron (ZOFRAN) IV  Physical Exam Vitals signs and nursing note reviewed.  Constitutional:      Appearance: She is cachectic. She is ill-appearing.  HENT:     Head: Normocephalic and atraumatic.  Pulmonary:     Effort: No tachypnea, accessory muscle usage or respiratory distress.  Abdominal:     Tenderness: There is abdominal tenderness.   Skin:    General: Skin is warm and dry.     Coloration: Skin is pale.  Neurological:     Mental Status: She is easily aroused. She is lethargic.  Psychiatric:        Attention and Perception: She is inattentive.        Speech: Speech is delayed.        Cognition and Memory: Cognition is impaired.            Vital Signs: BP 124/75 (BP Location: Left Arm)   Pulse 82   Temp 98.5 F (36.9 C) (Oral)   Resp 18   Ht 5\' 3"  (1.6 m)  Wt 79.8 kg   SpO2 93%   BMI 31.18 kg/m  SpO2: SpO2: 93 % O2 Device: O2 Device: Room Air O2 Flow Rate:    Intake/output summary:   Intake/Output Summary (Last 24 hours) at 03/10/2019 0949 Last data filed at 03/10/2019 0600 Gross per 24 hour  Intake 1307.17 ml  Output 800 ml  Net 507.17 ml   LBM: Last BM Date: 03/04/19 Baseline Weight: Weight: 75.8 kg Most recent weight: Weight: 79.8 kg       Palliative Assessment/Data: PPS 20%   Flowsheet Rows     Most Recent Value  Intake Tab  Referral Department  Hospitalist  Unit at Time of Referral  Cardiac/Telemetry Unit  Palliative Care Primary Diagnosis  Cancer  Date Notified  03/05/19  Palliative Care Type  New Palliative care  Reason for referral  Clarify Goals of Care  Date of Admission  03/04/19  Date first seen by Palliative Care  03/05/19  # of days Palliative referral response time  0 Day(s)  # of days IP prior to Palliative referral  1  Clinical Assessment  Palliative Performance Scale Score  20%  Pain Max last 24 hours  Not able to report  Pain Min Last 24 hours  Not able to report  Dyspnea Max Last 24 Hours  Not able to report  Dyspnea Min Last 24 hours  Not able to report  Psychosocial & Spiritual Assessment  Palliative Care Outcomes  Palliative Care Outcomes  Clarified goals of care, Counseled regarding hospice, Provided end of life care assistance, Provided psychosocial or spiritual support, Improved pain interventions, Improved non-pain symptom therapy, Transitioned to hospice,  Changed to focus on comfort      Patient Active Problem List   Diagnosis Date Noted  . Terminal care   . SBO (small bowel obstruction) ([place]) 03/05/2019  . Perforation bowel ([place]) 03/05/2019  . Abdominal infection ([place]) 03/05/2019  . Palliative care encounter 03/05/2019  . Goals of care, counseling/discussion   . Palliative care by specialist   . Acute encephalopathy 03/04/2019  . Syncope 06/09/2018  . New onset atrial fibrillation ([place]) 06/09/2018  . URI with cough and congestion 06/09/2018  . GIST (gastrointestinal stromal tumor) of small bowel, malignant ([place]) 01/12/2018  . Breast mass, right 11/29/2017  . Skin lesion of back 11/29/2017  . Difficulty walking 02/23/2017  . Sepsis ([place]) 02/17/2017  . Dementia, previously on Seroquel, able to wean with family and caregiver support 01/21/2017  . Abdominal mass, monitored by [facility] 01/21/2017  . NSVT (nonsustained ventricular tachycardia) ([place]) 05/11/2016  . Recurrent UTI, on daily Trimethroprim 02/01/2015  . Essential hypertension, on no medications 05/30/2013  . Coronary artery disease   . Diastolic dysfunction   . TIA (transient ischemic attack), questionable, 07/2010, with negative carotid dopplers   . History of GI bleed, with hemorrhoids, last colonoscopy 2012 at time of bleed   . Dyslipidemia, no statin   . Arthralgia of multiple joints 05/22/2009  . Vitamin D deficiency 01/27/2009    Palliative Care Assessment & Plan   Patient Profile: 85 y.o. female  with past medical history of dementia, GI ST cancer, CAD, diastolic dysfunction, hypertension, dyslipidemia, migraines, major depression, suicide attempts x2, admitted on 03/04/2019 with known abdominal mass containing gas inflammation and foci of free air, necrosis of the mass.   Assessment: Known abdominal necrotic mass with gas formation Partial small bowel obstruction Acute encephalopathy E. Coli bacteremia Enterococcus faecalis UTI Baseline dementia   Recommendations/Plan:  Comfort measures only. IVF d/c'd  by attending, as patient has generalized edema. Educated caregiver and daughter.   Symptom management medications on MAR. For now, daughters would like to continue prn fentanyl. If symptoms worsen, recommend switching to Morphine or Dilaudid.   SW consult for hospice facility placement. Stable for transfer to hospice today if bed available.   Comfort feeds per patient/family request. Recommend ice chips or liquids.  Code Status: DNR   Code Status Orders  (From admission, onward)         Start     Ordered   03/05/19 0056  Do not attempt resuscitation (DNR)  Continuous    Question Answer Comment  In the event of cardiac or respiratory ARREST Do not call a "code blue"   In the event of cardiac or respiratory ARREST Do not perform Intubation, CPR, defibrillation or ACLS   In the event of cardiac or respiratory ARREST Use medication by any route, position, wound care, and other measures to relive pain and suffering. May use oxygen, suction and manual treatment of airway obstruction as needed for comfort.      03/05/19 0056        Code Status History    Date Active Date Inactive Code Status Order ID Comments User Context   03/04/2019 2247 03/05/2019 0056 DNR [id number]  [name], MD ED   06/09/2018 2107 06/10/2018 1827 DNR [id number]  Vianne Bulls, MD ED   06/09/2018 2107 06/09/2018 2107 DNR [id number]  Vianne Bulls, MD ED   02/17/2017 1701 02/19/2017 1501 DNR [id number]  Caren Griffins, MD Inpatient   01/21/2017 2304 01/22/2017 2102 DNR [id number]  [name], MD Inpatient   05/06/2016 1739 05/08/2016 1454 DNR [id number]  Tonye Royalty, MD Inpatient   12/29/2015 1625 12/30/2015 2016 DNR [id number]  Willia Craze, NP Inpatient   02/01/2015 1226 02/05/2015 1547 DNR [id number]  Bonnielee Haff, MD Inpatient   11/01/2014 2136 11/04/2014 1648 DNR [id number]  Ma Hillock, DO Inpatient   Advance Care Planning  Activity    Advance Directive Documentation     Most Recent Value  Type of Advance Directive  Healthcare Power of Attorney, Out of facility DNR (pink MOST or yellow form)  Pre-existing out of facility DNR order (yellow form or pink MOST form)  Physician notified to receive inpatient order, Pink MOST/Yellow Form most recent copy in chart - Physician notified to receive inpatient order  "MOST" Form in Place?  -       Prognosis:   Poor prognosis likely days  Discharge Planning:  Hospice facility vs hospital death if further decline inpatient.  Care plan was discussed with daughter Fraser Din) and caregiver Langley Gauss)  Thank you for allowing the Palliative Medicine Team to assist in the care of this patient.   Time In: 0930 Time Out: 0950 Total Time 20 Prolonged Time Billed  no      Greater than 50%  of this time was spent counseling and coordinating care related to the above assessment and plan.  Ihor Dow, DNP, FNP-C Palliative Medicine Team  Phone: [phone] Fax: [phone]  Please contact Palliative Medicine Team phone at [phone] for questions and concerns.

## 2019-03-10 NOTE — Progress Notes (Signed)
[facility] Tourist information centre manager  Continue to follow for patient/family interest in [place]. Unfortunately [facility] is not able to offer room today. Will continue to follow and update family/staff if availability changes.   Thank you,  Erling Conte, LCSW [phone]

## 2019-03-11 DIAGNOSIS — R4182 Altered mental status, unspecified: Secondary | ICD-10-CM

## 2019-03-11 NOTE — TOC Progression Note (Signed)
Transition of Care [facility]) - Progression Note    Patient Details  Name: Valerie Chen MRN: [id number] Date of Birth: 11-30-1928  Transition [facility]) CM/SW [place], [place] Phone Number: 03/11/2019, 9:42 AM  Clinical Narrative:   CSW spoke with Harmon Pier with [facility]; per Harmon Pier, will be able to offer a bed for the patient tomorrow. Harmon Pier to reach out to family to try to complete admission paperwork today. CSW to follow.    Expected Discharge Plan: [place]    Expected Discharge Plan and Services Expected Discharge Plan: [place] In-house Referral: Hospice / Palliative Care Discharge Planning Services: CM Consult Post Acute Care Choice: Hospice   Expected Discharge Date: 03/07/19               DME Arranged: N/A DME Agency: NA       HH Arranged: NA HH Agency: NA         Social Determinants of Health (SDOH) Interventions    Readmission Risk Interventions No flowsheet data found.

## 2019-03-11 NOTE — Progress Notes (Signed)
[facility] room available for patient tomorrow. Daughter Fraser Din aware and agreeable. Plan to complete paperwork today in anticipation for transfer tomorrow. CSW Kathlee Nations aware.   Thank you,  Erling Conte, LCSW [phone]

## 2019-03-11 NOTE — Plan of Care (Signed)
  Problem: Pain Managment: Goal: General experience of comfort will improve Outcome: Progressing   

## 2019-03-11 NOTE — Progress Notes (Signed)
PALLIATIVE NOTE:  Patient resting in bed. Appears comfortable. No signs of acute distress. Caregiver, Langley Gauss is at the bedside along with RN.  Updates given by RN with no concerns.   Discussed with caregiver symptom management and continued assessment by nursing staff. Caregiver verbalized appreciation and support of all care being received. She is aware that if she notices any changes or signs of discomfort to notify RN for further assessment and management.   Patient has available bed at [facility] per notation with an anticipated transfer tomorrow. Daughter, Fraser Din is aware of transfer plan as well. Discussed with daughter and caregiver who states she will be at the bedside tomorrow, regarding patient receiving medication prior to transfer to assist with decreasing discomfort with transfer to [facility]. All verbalized appreciation and support of plan.   All questions answered and updates provided.   Assessment:  -resting, ill-appearing, cachectic -pale in color -reported impaired cognition although not assessed due to lethargy  Plan: -DNR/DNI -Full Comfort -Transfer to [facility] tomorrow for continued EOL care -PMT will continue to support and follow  Total Time: 20 min.   Greater than 50%  of this time was spent counseling and coordinating care related to the above assessment and plan.  Alda Lea, AGPCNP-BC Palliative Medicine Team  Phone: [phone] Pager: [phone] Amion: Bjorn Pippin

## 2019-03-11 NOTE — Progress Notes (Signed)
PROGRESS NOTE  Valerie Chen [id number] DOB: 1928-12-08 DOA: 03/04/2019 PCP: Briscoe Deutscher, DO  HPI/Recap of past 43 hours:  85 year old with past medical history of abdominal tomorrow thought to be GI ST, no biopsy done, family never pursue surgery or biopsy, dementia, coronary artery disease, diastolic dysfunction, A. fib was noted to have increase generalized weakness over the last 2 days.  She was treated for UTI with antibiotics.  Patient had an acute change in mental status and worsening abdominal pain and vomiting the day of admission.  Patient had a temperature of 101 at home.  Evaluation in the ED patient was found to be encephalopathic with minimal response, sodium 128 creatinine 0.9, lactate of 2.5, hemoglobin 14, white blood cell 5.7.  CT abdomen and pelvis showed possible necrosis of the tumor with possible perforation and small bowel obstruction. Initially family discussed with the ER physician who requested no active measure and no surgical intervention and keep patient in comfort.  Plan was to continue with IV antibiotics.  Patient family requested surgical evaluation.  Dr. Lucia Gaskins evaluated patient.  Patient is not a surgical candidate. Family would like to continue with IV antibiotics for now and and planning further discussion with Dr. Marin Olp and palliative care.  Hospital course complicated by worsening findings on CT abdomen and pelvis with contrast involving abdominal mass and small bowel obstruction.  Very poor prognosis.  Seen by palliative and oncology, following.  Daughters made decision for comfort care only.  Awaiting placement at [facility].  CSW assisting with placement.  03/11/19: Patient was seen and examined with her private sitter at bedside.  No acute events overnight.  She appears comfortable in no acute distress.  [facility]waiting [facility] at [facility].  Possible bed offer tomorrow 03/12/2019 per CSW.   Assessment/Plan: Principal Problem:   Acute  encephalopathy Active Problems:   Sepsis ([place])   SBO (small bowel obstruction) ([place])   Perforation bowel ([place])   Abdominal infection ([place])   Palliative care encounter   Goals of care, counseling/discussion   Palliative care by specialist   Terminal care  Known abdominal mass containing gas/inflammation/foci of free air  Necrosis of the mass with gas formation superimposed infection or involvement of bowel with perforation.  Dilated loop of small bowel within the left abdomen which may represent focal ileus or developing small bowel obstruction Abdominal x-ray done on 03/07/2019 shows increasing partial small bowel obstruction. Personally reviewed CT abdomen and pelvis with contrast done on 03/08/2019 which showed worsening abdominal mass with small bowel obstruction and bilateral pleural effusion right greater than left.   Very poor prognosis Family made decision to withhold care that would prolong life.  Patient is comfort care only.  Partial small bowel obstruction Management as stated above Currently n.p.o. Maintain potassium greater than 4.0 Mobilize if able and as tolerated  Acute metabolic acidosis in the setting of partial small bowel obstruction Chemistry bicarb improved after isotonic bicarb infusion Chemistry bicarb 23 on 03/08/2019 from 18 on 03/07/2019 Continue daily BMPs  Hypokalemia Potassium 3.3 Repleted with IV potassium supplement Goal potassium greater than 4.0 Continue D5 LR with KCl 10 mEq at 50 cc/h  Hypophosphatemia in the setting of poor oral intake Phosphorus 1.5 Repleted with K-Phos IV 40 mEq once Repeat phosphorus level in the morning  Acute metabolic encephalopathy likely multifactorial secondary to partial SBO versus E. coli bacteremia versus Enterococcus faecalis UTI versus known abdominal mass in the setting of dementia and advanced age. Continue to treat underlying conditions Reorient  as necessary  Sepsis secondary to E. coli bacteremia and  Enterococcus faecalis UTI Presented with tachycardia and tachypnea in the setting of bacteremia and UTI Management as stated below Continue to monitor fever curve and WBC Currently afebrile no leukocytosis Lactic acid 1.2 on 03/08/2019 Procalcitonin 0.79 on 03/08/2019 CBC reassuring SIRS criteria appears to be resolving on IV antibiotics  E. coli bacteremia likely from GI source Continue to treat underlying condition Blood cultures drawn on 03/04/2019 positive for E. 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 Marin Olp  Chronic A. fib not on oral anti-coagulation Rate is controlled  Goals of care Palliative care team following Discussed with daughter via phone on 03/08/2019.  She would like to continue treatment at this time and take it 1 day at a time. Family made decision for comfort care only on 03/09/2019.  All focus is on comfort only.   Risks: Patient is at high risk for decompensation due to withholding all care that would prolong life and focus on comfort care only.   DVT prophylaxis: SCDs, daily subcu Lovenox.  Code Status: DNR Family Communication: None at bedside Disposition Plan:  Will discharge to [facility] once bed is available. Consultants:   Surgery  Palliative  Oncology  Procedures:   none  Antimicrobials:  Vancomycin 9-13 Cefepime 9/13>>    Objective: Vitals:   03/10/19 1721 03/10/19 2021 03/11/19 0350 03/11/19 0923  BP: 115/76 (!) 129/97 134/72 130/70  Pulse: (!) 106 (!) 109 96 82  Resp: 18 18 20 19   Temp: 98.7 F (37.1 C) 97.8 F (36.6 C) (!) 97.5 F (36.4 C) 99.2 F (37.3 C)  TempSrc: Oral Oral Oral Axillary    SpO2: 92% 95% 90% 93%  Weight:      Height:        Intake/Output Summary (Last 24 hours) at 03/11/2019 1002 Last data filed at 03/11/2019 [id number] Gross per 24 hour  Intake 134 ml  Output --  Net 134 ml   Filed Weights   03/05/19 0249 03/05/19 2328 03/08/19 0558  Weight: 79.4 kg 79.1 kg 79.8 kg    Exam:   General: 85 y.o. year-old female frail-appearing no acute distress.  Lethargic.    Cardiovascular: Irregular rate and rhythm no rubs or gallops no JVD thyromegaly noted.    Respiratory: Mild rales noted anteriorly.  No wheezes noted.  Poor inspiratory effort.    Abdomen: Distended, minimal bowel sounds noted on exam.  Musculoskeletal: Trace lower extremity edema.  Improved dependent edema affecting upper extremities bilaterally.  Psychiatry: Unable to assess mood due to lethargy.   Data Reviewed: CBC: Recent Labs  Lab 03/04/19 1602 03/06/19 1052 03/07/19 0447 03/08/19 0447  WBC 5.7 5.6 4.9 6.0  NEUTROABS 5.0  --   --  4.5  HGB 14.9 14.7 13.8 14.0  HCT 42.3 44.4 41.1 41.5  MCV 89.1 92.7 91.7 91.0  PLT 130* 151 161 [id number]   Basic Metabolic Panel: Recent Labs  Lab 03/04/19 1602 03/06/19 1052 03/07/19 0447 03/08/19 0447 03/09/19 0804  NA 128* 134* 135 139 138  K 3.8 4.0 3.6 3.3* 3.8  CL 95* 105 110 108 107  CO2 22 19* 18* 23 22  GLUCOSE 148* 103* 99 136* 138*  BUN 15 20 18  19 17  CREATININE 0.93 0.68 0.62 0.65 0.49  CALCIUM 8.8* 8.6* 8.1* 8.2* 8.6*  MG  --   --   --  2.1  --   PHOS  --   --   --  1.5*  --    GFR: Estimated Creatinine Clearance: 48.7 mL/min (by C-G formula based on SCr of 0.49 mg/dL). Liver Function Tests: Recent Labs  Lab 03/04/19 1602 03/08/19 0447  AST 29 21  ALT 34 22  ALKPHOS 75 75  BILITOT 1.6* 1.1  PROT 5.7* 5.2*  ALBUMIN 3.0* 2.0*   No results for input(s): LIPASE, AMYLASE in the last 168 hours. No results for input(s): AMMONIA in the last 168 hours. Coagulation Profile: Recent Labs  Lab 03/04/19 1602  INR 1.1    Cardiac Enzymes: No results for input(s): CKTOTAL, CKMB, CKMBINDEX, TROPONINI in the last 168 hours. BNP (last 3 results) No results for input(s): PROBNP in the last 8760 hours. HbA1C: No results for input(s): HGBA1C in the last 72 hours. CBG: Recent Labs  Lab 03/07/19 1623 03/07/19 2028 03/07/19 2359 03/08/19 0410 03/08/19 0725  GLUCAP 96 106* 117* 129* 128*   Lipid Profile: No results for input(s): CHOL, HDL, LDLCALC, TRIG, CHOLHDL, LDLDIRECT in the last 72 hours. Thyroid Function Tests: No results for input(s): TSH, T4TOTAL, FREET4, T3FREE, THYROIDAB in the last 72 hours. Anemia Panel: No results for input(s): VITAMINB12, FOLATE, FERRITIN, TIBC, IRON, RETICCTPCT in the last 72 hours. Urine analysis:    Component Value Date/Time   COLORURINE AMBER (A) 03/04/2019 1740   APPEARANCEUR HAZY (A) 03/04/2019 1740   LABSPEC 1.012 03/04/2019 1740   PHURINE 5.0 03/04/2019 1740   GLUCOSEU NEGATIVE 03/04/2019 1740   GLUCOSEU NEGATIVE 12/26/2018 1055   HGBUR NEGATIVE 03/04/2019 1740   BILIRUBINUR NEGATIVE 03/04/2019 1740   BILIRUBINUR Negative 06/06/2018 1450   KETONESUR NEGATIVE 03/04/2019 1740   PROTEINUR 30 (A) 03/04/2019 1740   UROBILINOGEN 0.2 12/26/2018 1055   NITRITE NEGATIVE 03/04/2019 1740   LEUKOCYTESUR NEGATIVE 03/04/2019 1740   Sepsis Labs: @LABRCNTIP (procalcitonin:4,lacticidven:4)  ) Recent Results (from the past 240 hour(s))  Urine culture     Status: Abnormal   Collection Time: 03/02/19  3:01 PM   Specimen: Urine, Clean Catch  Result Value Ref Range Status   Specimen Description   Final    URINE, CLEAN CATCH Performed at [facility], [place] [address], [place], [place] [address]    Special Requests   Final    NONE Performed at [facility], [place] [address], [place],  [address]    Culture >=100,000 COLONIES/mL ENTEROCOCCUS FAECALIS (A)  Final   Report Status 03/05/2019 FINAL  Final   Organism ID, Bacteria  ENTEROCOCCUS FAECALIS (A)  Final      Susceptibility   Enterococcus faecalis - MIC*    AMPICILLIN <=2 SENSITIVE Sensitive     LEVOFLOXACIN 2 SENSITIVE Sensitive     NITROFURANTOIN <=16 SENSITIVE Sensitive     VANCOMYCIN 2 SENSITIVE Sensitive     * >=100,000 COLONIES/mL ENTEROCOCCUS FAECALIS  Blood Culture (routine x 2)     Status: Abnormal   Collection Time: 03/04/19  4:02 PM   Specimen: BLOOD  Result Value Ref Range Status   Specimen Description BLOOD LEFT ANTECUBITAL  Final   Special Requests   Final    BOTTLES DRAWN AEROBIC AND ANAEROBIC Blood Culture adequate volume   Culture  Setup Time   Final    GRAM NEGATIVE RODS ANAEROBIC BOTTLE ONLY CRITICAL RESULT CALLED  TO, READ BACK BY AND VERIFIED WITH: E PEREZ,PHARMD AT 1611 03/05/2019 BY L BENFIELD Performed at [facility], [place] [address], [place], [place] [address]    Culture ESCHERICHIA COLI (A)  Final   Report Status 03/07/2019 FINAL  Final   Organism ID, Bacteria ESCHERICHIA COLI  Final      Susceptibility   Escherichia coli - MIC*    AMPICILLIN >=32 RESISTANT Resistant     CEFAZOLIN >=64 RESISTANT Resistant     CEFEPIME <=1 SENSITIVE Sensitive     CEFTAZIDIME <=1 SENSITIVE Sensitive     CEFTRIAXONE <=1 SENSITIVE Sensitive     CIPROFLOXACIN <=0.25 SENSITIVE Sensitive     GENTAMICIN <=1 SENSITIVE Sensitive     IMIPENEM <=0.25 SENSITIVE Sensitive     TRIMETH/SULFA <=20 SENSITIVE Sensitive     AMPICILLIN/SULBACTAM >=32 RESISTANT Resistant     PIP/TAZO 8 SENSITIVE Sensitive     Extended ESBL NEGATIVE Sensitive     * ESCHERICHIA COLI  Urine culture     Status: None   Collection Time: 03/04/19  4:02 PM   Specimen: In/Out Cath Urine  Result Value Ref Range Status   Specimen Description IN/OUT CATH URINE  Final   Special Requests NONE  Final   Culture   Final    NO GROWTH Performed at [facility], [place] [address], [place], [place] [address]    Report Status 03/05/2019 FINAL  Final  Blood Culture ID Panel  (Reflexed)     Status: Abnormal   Collection Time: 03/04/19  4:02 PM  Result Value Ref Range Status   Enterococcus species NOT DETECTED NOT DETECTED Final   Listeria monocytogenes NOT DETECTED NOT DETECTED Final   Staphylococcus species NOT DETECTED NOT DETECTED Final   Staphylococcus aureus (BCID) NOT DETECTED NOT DETECTED Final   Streptococcus species NOT DETECTED NOT DETECTED Final   Streptococcus agalactiae NOT DETECTED NOT DETECTED Final   Streptococcus pneumoniae NOT DETECTED NOT DETECTED Final   Streptococcus pyogenes NOT DETECTED NOT DETECTED Final   Acinetobacter baumannii NOT DETECTED NOT DETECTED Final   Enterobacteriaceae species DETECTED (A) NOT DETECTED Final    Comment: Enterobacteriaceae represent a large family of gram-negative bacteria, not a single organism. CRITICAL RESULT CALLED TO, READ BACK BY AND VERIFIED WITH: E PEREZ,PHARMD AT 1611 03/05/2019 BY L BENFIELD    Enterobacter cloacae complex NOT DETECTED NOT DETECTED Final   Escherichia coli DETECTED (A) NOT DETECTED Final    Comment: CRITICAL RESULT CALLED TO, READ BACK BY AND VERIFIED WITH: E PEREZ,PHARMD AT 1611 03/05/2019 BY L BENFIELD    Klebsiella oxytoca NOT DETECTED NOT DETECTED Final   Klebsiella pneumoniae NOT DETECTED NOT DETECTED Final   Proteus species NOT DETECTED NOT DETECTED Final   Serratia marcescens NOT DETECTED NOT DETECTED Final   Carbapenem resistance NOT DETECTED NOT DETECTED Final   Haemophilus influenzae NOT DETECTED NOT DETECTED Final   Neisseria meningitidis NOT DETECTED NOT DETECTED Final   Pseudomonas aeruginosa NOT DETECTED NOT DETECTED Final   Candida albicans NOT DETECTED NOT DETECTED Final   Candida glabrata NOT DETECTED NOT DETECTED Final   Candida krusei NOT DETECTED NOT DETECTED Final   Candida parapsilosis NOT DETECTED NOT DETECTED Final   Candida tropicalis NOT DETECTED NOT DETECTED Final    Comment: Performed at [facility], [place] [address], [place], [place] [address]   SARS Coronavirus 2 [facility] order, Performed in [facility] hospital lab) Nasopharyngeal Nasopharyngeal Swab     Status: None   Collection  Time: 03/04/19  4:39 PM   Specimen: Nasopharyngeal Swab  Result Value Ref Range Status   SARS Coronavirus 2 NEGATIVE NEGATIVE Final    Comment: (NOTE) If result is NEGATIVE SARS-CoV-2 target nucleic acids are NOT DETECTED. The SARS-CoV-2 RNA is generally detectable in upper and lower  respiratory specimens during the acute phase of infection. The lowest  concentration of SARS-CoV-2 viral copies this assay can detect is 250  copies / mL. A negative result does not preclude SARS-CoV-2 infection  and should not be used as the sole basis for treatment or other  patient management decisions.  A negative result may occur with  improper specimen collection / handling, submission of specimen other  than nasopharyngeal swab, presence of viral mutation(s) within the  areas targeted by this assay, and inadequate number of viral copies  (<250 copies / mL). A negative result must be combined with clinical  observations, patient history, and epidemiological information. If result is POSITIVE SARS-CoV-2 target nucleic acids are DETECTED. The SARS-CoV-2 RNA is generally detectable in upper and lower  respiratory specimens dur ing the acute phase of infection.  Positive  results are indicative of active infection with SARS-CoV-2.  Clinical  correlation with patient history and other diagnostic information is  necessary to determine patient infection status.  Positive results do  not rule out bacterial infection or co-infection with other viruses. If result is PRESUMPTIVE POSTIVE SARS-CoV-2 nucleic acids MAY BE PRESENT.   A presumptive positive result was obtained on the submitted specimen  and confirmed on repeat testing.  While 2019 novel coronavirus  (SARS-CoV-2) nucleic acids may be present in the submitted sample  additional confirmatory testing may be  necessary for epidemiological  and / or clinical management purposes  to differentiate between  SARS-CoV-2 and other Sarbecovirus currently known to infect humans.  If clinically indicated additional testing with an alternate test  methodology [phone]) is advised. The SARS-CoV-2 RNA is generally  detectable in upper and lower respiratory sp ecimens during the acute  phase of infection. The expected result is Negative. Fact Sheet for Patients:  StrictlyIdeas.no Fact Sheet for Healthcare Providers: BankingDealers.co.za This test is not yet approved or cleared by the [place] FDA and has been authorized for detection and/or diagnosis of SARS-CoV-2 by FDA under an Emergency Use Authorization (EUA).  This EUA will remain in effect (meaning this test can be used) for the duration of the COVID-19 declaration under Section 564(b)(1) of the Act, 21 U.S.C. section 360bbb-3(b)(1), unless the authorization is terminated or revoked sooner. Performed at [facility], [place] [address] [place], [place] [address]   Blood Culture (routine x 2)     Status: None   Collection Time: 03/05/19 12:26 AM   Specimen: BLOOD  Result Value Ref Range Status   Specimen Description BLOOD RIGHT ARM  Final   Special Requests   Final    BOTTLES DRAWN AEROBIC ONLY Blood Culture adequate volume   Culture   Final    NO GROWTH 5 DAYS Performed at [facility], [address] [place], [place] [address]    Report Status 03/10/2019 FINAL  Final      Studies: No results found.  Scheduled Meds:   Continuous Infusions:  ondansetron (ZOFRAN) IV 8 mg (03/11/19 [id number])     LOS: 7 days     Kayleen Memos, MD [facility] Pager [phone]  If 7PM-7AM, please contact night-coverage [email or web address] Password [email or web address] 03/11/2019, 10:02 AM

## 2019-03-11 NOTE — Plan of Care (Signed)
  Problem: Coping: Goal: Level of anxiety will decrease Outcome: Not Progressing   

## 2019-03-11 NOTE — Progress Notes (Signed)
[facility] room available for patient Monday. Paper work completed with daughter Fraser Din earlier today. Fraser Din wants to be present at time of transfer.   Please send discharge summary to [phone].  RN please call report to [phone] prior to patient leaving the unit.   Thank you,  Erling Conte, LCSW [phone]  [facility] liaisons are listed daily on AMION under Hospice and [place].

## 2019-03-12 NOTE — TOC Transition Note (Addendum)
Transition of Care (TOC) - CM/SW Discharge Note *Discharged to [facility] [place] [place]   Patient Details  Name: Valerie Chen MRN: [id number] Date of Birth: May 20, 1929  Transition of Care [facility]) CM/SW Contact:  Sable Feil, LCSW Phone Number: 03/12/2019, 12:02 PM   Clinical Narrative: Hospice bed available and patient discharging to [facility] facility in [place]. Daughter Rolm Bookbinder [phone]) contacted and informed that ambulance transport will be arranged. CSW informed that she visited with her mom last night and will not be coming to the hospital today, however the caregiver is in the room.      Final next level of care: [place]) [place] Barriers to Discharge: No Barriers Identified   Patient Goals and CMS Choice Patient states their goals for this hospitalization and ongoing recovery are:: Daughter agreeable to Hospice facility for her mother for end of life care CMS Medicare.gov Compare Post Acute Care list provided to:: Other (Comment Required)(n/a as patient discharging to Hospice facility) Choice offered to / list presented to : NA  Discharge Placement - [place] at [facility]              Patient chooses bed at: ([facility] facility) Patient to be transferred to facility by: Ambulance Name of family member notified: Daughter Rolm Bookbinder [phone] Patient and family notified of of transfer: 03/12/19  Discharge Plan and Services In-house Referral: Hospice / Palliative Care Discharge Planning Services: CM Consult Post Acute Care Choice: Hospice          DME Arranged: N/A DME Agency: NA       HH Arranged: NA HH Agency: NA        Social Determinants of Health (SDOH) Interventions  No SDOH services needed at transport   Readmission Risk  Interventions No flowsheet data found.

## 2019-03-12 NOTE — Discharge Summary (Signed)
Discharge Summary  Valerie Chen [id number] DOB: 1929-02-27  PCP: Briscoe Deutscher, DO  Admit date: 03/04/2019 Discharge date: 03/12/2019  Time spent: 35 minutes  Recommendations for Outpatient Follow-up:  1. Continue with hospice care  Discharge Diagnoses:  Active Hospital Problems   Diagnosis Date Noted   Acute encephalopathy 03/04/2019   Terminal care    SBO (small bowel obstruction) ([place]) 03/05/2019   Perforation bowel ([place]) 03/05/2019   Abdominal infection ([place]) 03/05/2019   Palliative care encounter 03/05/2019   Goals of care, counseling/discussion    Palliative care by specialist    Sepsis [facility]) 02/17/2017    Resolved Hospital Problems  No resolved problems to display.    Discharge Condition: Stable for discharge to hospice care  Diet recommendation: Pleasure feedings.  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 fib, essential hypertension, history of migraines/anxiety/depression who has been followed for a mesenteric mass since August 2018.  Was brought into the hospital with nausea, vomiting and altered mental status.  She was very functional up until the week prior to presentation.  She was treated for UTI with some improvement.  Had a significant decline in her mental status and functional status.   Patient had a temperature of 101 at home.  Per evaluation in the ED patient was found to be encephalopathic with minimal response, sodium 128 creatinine 0.9, lactate of 2.5, hemoglobin 14, white blood cell 5.7. CT abdomen and pelvis showed possible necrosis of the tumor with possible perforation and small bowel obstruction. Initially family discussed with the ER physician who requested  no active measure and no surgical intervention and keep patient in comfort. Plan was to continue with IV antibiotics.  Patient family requested surgical evaluation. Dr. Lucia Gaskins evaluated patient. Patient is not a surgical candidate. Family would like to continue with IV antibiotics for now and and planning further discussion with Dr. Marin Olp and palliative care.  Hospital course complicated by worsening findings on CT abdomen and pelvis with contrast involving abdominal mass and small bowel obstruction.  Very poor prognosis.  Seen by palliative and oncology.  Daughters made decision for comfort care only.  Accepted at [facility] on 03/12/19.   03/12/19: Patient was seen and examined with her personal sitter at bedside.  She is in no acute distress.  Vital signs reviewed and are stable.  Patient is medically stable for transport to [facility] to continue with hospice care.   Hospital Course:  Principal Problem:   Acute encephalopathy Active Problems:   Sepsis ([place])   SBO (small bowel obstruction) ([place])   Perforation bowel ([place])   Abdominal infection ([place])   Palliative care encounter   Goals of care, counseling/discussion   Palliative care by specialist   Terminal care  Known abdominal mass containing gas/inflammation/foci of free air  Necrosis of the mass with gas formation superimposed infection or involvement of bowel with perforation. Dilated loop of small bowel within the left abdomen which may represent focal ileus or developing small bowel obstruction Abdominal x-ray done on 03/07/2019 shows increasing partial small bowel obstruction. Personally reviewed CT abdomen and pelvis with contrast done on 03/08/2019 which showed worsening abdominal mass with small bowel obstruction and bilateral pleural effusion  right greater than left.   Very poor prognosis Family made decision to withhold treatment and focus on comfort.  Other working diagnoses:  -Partial small bowel  obstruction -Acute metabolic acidosis in the setting of partial small bowel obstruction -Hypokalemia -Hypophosphatemia in the setting of poor oral intake -Acute metabolic encephalopathy likely multifactorial secondary to partial SBO versus E. coli bacteremia versus Enterococcus faecalis UTI versus known abdominal mass in the setting of dementia and advanced age. -Sepsis secondary to E. coli bacteremia and Enterococcus faecalis UTI -E. 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 fib not on oral anti-coagulation  Goals of care Seen by Palliative care team.  Family made decision for comfort care only on 03/09/2019.  All focus is on comfort only.   Code Status:DNR  Consultants:  Surgery  Palliative  Oncology  Procedures:  None  Antimicrobials:  Completed IV Vancomycin 9-13 Completed IV Cefepime 9/13>> 03/09/19    Discharge Exam: BP 130/72 (BP Location: Left Arm)    Pulse 85    Temp 99 F (37.2 C) (Oral)    Resp 18    Ht 5\' 3"  (1.6 m)    Wt 79.8 kg    SpO2 94%    BMI 31.18 kg/m   General: 85 y.o. year-old female Frail appearing in no acute distress.  Lethargic.  Cardiovascular: Regular rate and rhythm with no rubs or gallops.  No thyromegaly or JVD noted.    Respiratory: Clear to auscultation with no wheezes or rales. Good inspiratory effort.  Abdomen: Mildly distended, hypoactive bowel sounds.  Musculoskeletal: Trace lower extremity edema.   Psychiatry: Unable to assess mood due to lethargy.  Discharge Instructions You were cared for by a hospitalist during your hospital stay. If you have any questions about your discharge medications or the care you received while you were in the hospital after you are discharged, you can call the unit and asked to speak with the hospitalist on call if the hospitalist that took care of you is not  available. Once you are discharged, your primary care physician will handle any further medical issues. Please note that NO REFILLS for any discharge medications will be authorized once you are discharged, as it is imperative that you return to your primary care physician (or establish a relationship with a primary care physician if you do not have one) for your aftercare needs so that they can reassess your need for medications and monitor your lab values.   Allergies as of 03/12/2019      Reactions   Levaquin [levofloxacin] Other (See Comments)   Altered mental status    Rocephin [ceftriaxone] Other (See Comments)   Uncontrollable chills/shaking, dizziness, and disorientation after receiving this (??)   Statins Other (See Comments)   Severe myalgias      Medication List    STOP taking these medications   acetaminophen 500 MG tablet Commonly known as: TYLENOL   aspirin EC 81 MG tablet   multivitamin with minerals Tabs tablet   nitrofurantoin (macrocrystal-monohydrate) 100 MG capsule Commonly known as: MACROBID   Omega-3 Fish Oil 300 MG Caps   OVER THE COUNTER MEDICATION   OVER THE COUNTER MEDICATION   vitamin B-12 500 MCG tablet Commonly known as: CYANOCOBALAMIN   vitamin C 1000 MG tablet   Vitamin D-3 25 MCG (1000 UT) Caps      Allergies  Allergen Reactions   Levaquin [Levofloxacin] Other (See  Comments)    Altered mental status    Rocephin [Ceftriaxone] Other (See Comments)    Uncontrollable chills/shaking, dizziness, and disorientation after receiving this (??)   Statins Other (See Comments)    Severe myalgias      The results of significant diagnostics from this hospitalization (including imaging, microbiology, ancillary and laboratory) are listed below for reference.    Significant Diagnostic Studies: Ct Abdomen Pelvis Wo Contrast  Result Date: 03/04/2019 CLINICAL DATA:  85 year old female with abdominal and pelvic pain with fever. Known mesenteric  mass. EXAM: CT ABDOMEN AND PELVIS WITHOUT CONTRAST TECHNIQUE: Multidetector CT imaging of the abdomen and pelvis was performed following the standard protocol without IV contrast. COMPARISON:  02/27/2019 CT and prior studies FINDINGS: Please note that parenchymal abnormalities may be missed without intravenous contrast. Lower chest: Trace bilateral pleural effusions and bibasilar opacities/atelectasis noted. Cardiomegaly is present. Hepatobiliary: The liver and gallbladder are unremarkable. No biliary dilatation. Pancreas: Unremarkable Spleen: Unremarkable Adrenals/Urinary Tract: The kidneys, adrenal glands and bladder are unremarkable. Stomach/Bowel: The previously identified abdominal mass measures 6.5 x 10 cm and now contains gas. Moderate inflammation and foci of pneumoperitoneum are noted adjacent to the mass. There are mildly distended loops of small bowel within the LEFT abdomen. No other bowel abnormalities are identified. Vascular/Lymphatic: Aortic atherosclerosis. No enlarged abdominal or pelvic lymph nodes. Reproductive: Status post hysterectomy. No adnexal masses. Other: No ascites or focal collection. Musculoskeletal: No acute or suspicious bony abnormalities identified. IMPRESSION: 1. Large abdominal mass again noted, but now containing gas with moderate adjacent inflammation and foci of free air. This may represent necrosis of this mass with gas formation, superimposed infection or involvement of bowel with perforation. No evidence of abscess. 2. Dilated loops of small bowel within the LEFT abdomen which may represent a focal ileus or developing small bowel obstruction. 3. Trace bilateral pleural effusions with bibasilar opacities/atelectasis. 4.  Aortic Atherosclerosis (ICD10-I70.0). Electronically Signed   By: Margarette Canada M.D.   On: 03/04/2019 17:35   Ct Head Wo Contrast  Result Date: 03/04/2019 CLINICAL DATA:  85 year old female with altered level of consciousness. EXAM: CT HEAD WITHOUT  CONTRAST TECHNIQUE: Contiguous axial images were obtained from the base of the skull through the vertex without intravenous contrast. COMPARISON:  03/02/2019 CT and prior studies FINDINGS: Brain: No evidence of acute infarction, hemorrhage, hydrocephalus, extra-axial collection or mass lesion/mass effect. Atrophy and chronic small-vessel white matter ischemic changes again noted. Vascular: Atherosclerotic calcifications noted. Skull: Normal. Negative for fracture or focal lesion. Sinuses/Orbits: No acute finding. Other: None. IMPRESSION: 1. No evidence of acute intracranial abnormality. 2. Atrophy and chronic small-vessel white matter ischemic changes. Electronically Signed   By: Margarette Canada M.D.   On: 03/04/2019 17:20   Ct Head Wo Contrast  Result Date: 03/02/2019 CLINICAL DATA:  Altered level of consciousness EXAM: CT HEAD WITHOUT CONTRAST TECHNIQUE: Contiguous axial images were obtained from the base of the skull through the vertex without intravenous contrast. COMPARISON:  02/07/2019 FINDINGS: Brain: Chronic atrophic changes and chronic white matter ischemic changes are again noted and stable. No findings to suggest acute hemorrhage, acute infarction or space-occupying mass lesion are seen. Vascular: No hyperdense vessel or unexpected calcification. Skull: Normal. Negative for fracture or focal lesion. Sinuses/Orbits: No acute finding. Other: None. IMPRESSION: Chronic atrophic and ischemic changes stable from the prior exam. No acute abnormality noted. Electronically Signed   By: Inez Catalina M.D.   On: 03/02/2019 12:24   Ct Abdomen Pelvis W Contrast  Result Date: 03/08/2019 CLINICAL  DATA:  Palpable abdominal mass, non pulsatile EXAM: CT ABDOMEN AND PELVIS WITH CONTRAST TECHNIQUE: Multidetector CT imaging of the abdomen and pelvis was performed using the standard protocol following bolus administration of intravenous contrast. CONTRAST:  114mL OMNIPAQUE IOHEXOL 300 MG/ML  SOLN COMPARISON:  Most recent CT  abdomen pelvis 03/04/2019 FINDINGS: Lower chest: Increasing right pleural effusion with now complete collapse of the right lower lobe and some adjacent passive atelectasis in the right middle lobe and right upper lobe. Slight interval increase in the size of the left pleural effusion as well. With adjacent passive atelectasis. Some hypoattenuation is present within the atelectatic lung suspicious for underlying infection. Mild cardiomegaly with right atrial enlargement. Atherosclerotic calcification of the coronary arteries. Hepatobiliary: Redemonstrated punctate calcifications are present within the posterior right lobe liver small amount of hypoattenuation is associated with the larger calcifications seen medially which is increasing in conspicuity since exams from 2018 and 2019. No other focal liver abnormality. Normal gallbladder. Mild prominence of the biliary tree may be related to senescent change. Pancreas: Unremarkable. No pancreatic ductal dilatation or surrounding inflammatory changes. Spleen: Normal in size without focal abnormality. Adrenals/Urinary Tract: Adrenal glands are unremarkable. Kidneys are normal, without renal calculi, focal lesion, or hydronephrosis. Bladder is unremarkable. Stomach/Bowel: Small hiatal hernia. Stomach is distended with air and ingested contrast medium. There is only partial contrast passage through the small bowel with a multitude of air and fluid distended small bowel loops in the upper abdomen. Dilation is seen to the level of the large, vascular air and fluid containing mid abdominal mass measuring 7.6 x 11.2 cm, slightly increased in size from prior when measuring at a similar level. Extensive surrounding stranding is present with free fluid and punctate foci of gas within the adjacent mesenteric leaflets. More distal small bowel is largely decompressed. There is portion of the transverse colon which directly abuts this lesion and loses a clear discernible fat plane  (3/61) concerning for direct involvement of the bowel wall. Vascular/Lymphatic: Mild aortic atherosclerosis. Scattered reactive lymph nodes are present in the mesentery. No clearly discernible pathologically enlarged nodes. Reproductive: Uterus is surgically absent. No concerning adnexal lesions. Retained ovaries without concerning lesion. Other: Collection of fluid in the deep pelvis appears to be developing some peripheral rim enhancement concerning for potential loculation or abscess formation. Additional reactive free fluid is seen in the mesentery with several scattered foci of extraluminal gas. There is increasing body wall edema. Subcutaneous air in the right lower quadrant may be related to injectable use. Musculoskeletal: Multilevel degenerative changes are present in the imaged portions of the spine. No acute osseous abnormality or suspicious osseous lesion. IMPRESSION: 1. Slight interval increase in size of the large, vascular air and fluid containing mid abdominal mass, now measuring up to 11.2 cm, with extensive surrounding stranding and free fluid. Suspect at least some of this changes related to the increasing fluid within the mass suggesting fistulization to the bowel 2. Mass results in upstream small bowel obstruction. 3. Portion of the transverse colon which directly abuts this lesion and loses a clear discernible fat plane concerning for direct involvement of the bowel wall. 4. Irregular calcified foci in the liver have some increasing conspicuity since 2018. Unclear if this could reflect malignant or metastatic involvement given the presence of the mid abdominal mass. 5. Collection of fluid in the deep pelvis appears to be developing some peripheral rim enhancement concerning for potential loculation or abscess formation. 6. Increasing pleural effusions now moderate on the right with  complete collapse of the right lower lobe. Some hypoattenuation is present within the atelectatic lung in the left  lung base suspicious for superimposed infection or sequela of aspiration. 7. Aortic Atherosclerosis (ICD10-I70.0). These results will be called to the ordering clinician or representative by the Radiologist Assistant, and communication documented in the PACS or zVision Dashboard. Electronically Signed   By: Lovena Le M.D.   On: 03/08/2019 22:47   Ct Abdomen Pelvis W Contrast  Result Date: 02/27/2019 CLINICAL DATA:  GI stromal tumor.  Abdominal mass. EXAM: CT ABDOMEN AND PELVIS WITH CONTRAST TECHNIQUE: Multidetector CT imaging of the abdomen and pelvis was performed using the standard protocol following bolus administration of intravenous contrast. CONTRAST:  125mL ISOVUE-300 IOPAMIDOL (ISOVUE-300) INJECTION 61% COMPARISON:  01/10/2018 FINDINGS: Lower chest: Lung parenchyma in the lower hemithorax obscured by respiratory motion. No substantial pleural effusion. Hepatobiliary: No suspicious focal abnormality within the liver parenchyma. There is no evidence for gallstones, gallbladder wall thickening, or pericholecystic fluid. No intrahepatic or extrahepatic biliary dilation. Pancreas: No focal mass lesion. No dilatation of the main duct. No intraparenchymal cyst. No peripancreatic edema. Spleen: No splenomegaly. No focal mass lesion. Adrenals/Urinary Tract: No adrenal nodule or mass. Kidneys unremarkable. No evidence for hydroureter. The urinary bladder appears normal for the degree of distention. Stomach/Bowel: Stomach is unremarkable. No gastric wall thickening. No evidence of outlet obstruction. Duodenum is normally positioned as is the ligament of Treitz. No small bowel wall thickening. No small bowel dilatation. The terminal ileum is normal. The appendix is not visualized, but there is no edema or inflammation in the region of the cecum. No gross colonic mass. No colonic wall thickening. Vascular/Lymphatic: There is abdominal aortic atherosclerosis without aneurysm. There is no gastrohepatic or  hepatoduodenal ligament lymphadenopathy. No intraperitoneal or retroperitoneal lymphadenopathy. No pelvic sidewall lymphadenopathy. Reproductive: The uterus is surgically absent. There is no adnexal mass. Other: No intraperitoneal free fluid. Mobile mesenteric mass in the pelvis again identified, position to the left of midline on today's exam (59/2). Lesion measures 8.7 x 4.7 cm today compared to 7.9 x 4.7 cm previously, suggesting no substantial interval change. Musculoskeletal: No worrisome lytic or sclerotic osseous abnormality. IMPRESSION: 1. Minimal interval enlargement of the mobile mesenteric soft tissue mass measuring 8.7 x 4.7 cm today compared to 7.9 x 4.7 cm previously. 2. Otherwise stable exam. No evidence for metastatic disease or intraperitoneal free fluid. Electronically Signed   By: Misty Stanley M.D.   On: 02/27/2019 10:36   Dg Chest Port 1 View  Result Date: 03/04/2019 CLINICAL DATA:  Patient with weakness. EXAM: PORTABLE CHEST 1 VIEW COMPARISON:  Chest radiograph 02/07/2019 FINDINGS: Monitoring leads overlie the patient. Stable cardiomegaly. Low lung volumes. Minimal bibasilar heterogeneous opacities. No pleural effusion or pneumothorax. Thoracic spine degenerative changes. IMPRESSION: Low lung volumes with bibasilar opacities favored to represent atelectasis. Infection not excluded Electronically Signed   By: Lovey Newcomer M.D.   On: 03/04/2019 16:27   Dg Abd 2 Views  Result Date: 03/07/2019 CLINICAL DATA:  Small bowel obstruction. EXAM: ABDOMEN - 2 VIEW COMPARISON:  Radiographs dated 09/17/2012 and CT scan of the abdomen dated 03/04/2019. FINDINGS: There is increased distention of multiple small bowel loops in the mid abdomen. There is air in the nondistended colon. Small bilateral pleural effusions. Pneumoperitoneum was noted on the prior CT scan. No discrete free air is noted on this exam. IMPRESSION: Increasing partial small bowel obstruction. Electronically Signed   By: Lorriane Shire  M.D.   On: 03/07/2019 11:18  Dg Hip Unilat With Pelvis 2-3 Views Right  Result Date: 03/02/2019 CLINICAL DATA:  Fall. EXAM: DG HIP (WITH OR WITHOUT PELVIS) 2-3V RIGHT COMPARISON:  CT abdomen pelvis dated February 27, 2019. FINDINGS: No acute fracture or dislocation. Unchanged mild right hip joint space narrowing and chondrocalcinosis. Similar appearing degenerative changes of the pubic symphysis. Osteopenia. Soft tissues are unremarkable. IMPRESSION: 1.  No acute osseous abnormality. Electronically Signed   By: Titus Dubin M.D.   On: 03/02/2019 12:21    Microbiology: Recent Results (from the past 240 hour(s))  Urine culture     Status: Abnormal   Collection Time: 03/02/19  3:01 PM   Specimen: Urine, Clean Catch  Result Value Ref Range Status   Specimen Description   Final    URINE, CLEAN CATCH Performed at [facility], [place] [address], [place], [place] [address]    Special Requests   Final    NONE Performed at [facility], [place] [address] [place], [place] [address]    Culture >=100,000 COLONIES/mL ENTEROCOCCUS FAECALIS (A)  Final   Report Status 03/05/2019 FINAL  Final   Organism ID, Bacteria ENTEROCOCCUS FAECALIS (A)  Final      Susceptibility   Enterococcus faecalis - MIC*    AMPICILLIN <=2 SENSITIVE Sensitive     LEVOFLOXACIN 2 SENSITIVE Sensitive     NITROFURANTOIN <=16 SENSITIVE Sensitive     VANCOMYCIN 2 SENSITIVE Sensitive     * >=100,000 COLONIES/mL ENTEROCOCCUS FAECALIS  Blood Culture (routine x 2)     Status: Abnormal   Collection Time: 03/04/19  4:02 PM   Specimen: BLOOD  Result Value Ref Range Status   Specimen Description BLOOD LEFT ANTECUBITAL  Final   Special Requests   Final    BOTTLES DRAWN AEROBIC AND ANAEROBIC Blood Culture adequate volume   Culture  Setup Time   Final    GRAM NEGATIVE RODS ANAEROBIC BOTTLE ONLY CRITICAL RESULT CALLED TO, READ BACK BY AND VERIFIED WITH: E PEREZ,PHARMD AT 1611 03/05/2019 BY L  BENFIELD Performed at [facility], [place] [address] [place], [place] [address]    Culture ESCHERICHIA COLI (A)  Final   Report Status 03/07/2019 FINAL  Final   Organism ID, Bacteria ESCHERICHIA COLI  Final      Susceptibility   Escherichia coli - MIC*    AMPICILLIN >=32 RESISTANT Resistant     CEFAZOLIN >=64 RESISTANT Resistant     CEFEPIME <=1 SENSITIVE Sensitive     CEFTAZIDIME <=1 SENSITIVE Sensitive     CEFTRIAXONE <=1 SENSITIVE Sensitive     CIPROFLOXACIN <=0.25 SENSITIVE Sensitive     GENTAMICIN <=1 SENSITIVE Sensitive     IMIPENEM <=0.25 SENSITIVE Sensitive     TRIMETH/SULFA <=20 SENSITIVE Sensitive     AMPICILLIN/SULBACTAM >=32 RESISTANT Resistant     PIP/TAZO 8 SENSITIVE Sensitive     Extended ESBL NEGATIVE Sensitive     * ESCHERICHIA COLI  Urine culture     Status: None   Collection Time: 03/04/19  4:02 PM   Specimen: In/Out Cath Urine  Result Value Ref Range Status   Specimen Description IN/OUT CATH URINE  Final   Special Requests NONE  Final   Culture   Final    NO GROWTH Performed at [facility], [place] [address], [place], [place] [address]    Report Status 03/05/2019 FINAL  Final  Blood Culture ID Panel (Reflexed)     Status: Abnormal   Collection Time: 03/04/19  4:02  PM  Result Value Ref Range Status   Enterococcus species NOT DETECTED NOT DETECTED Final   Listeria monocytogenes NOT DETECTED NOT DETECTED Final   Staphylococcus species NOT DETECTED NOT DETECTED Final   Staphylococcus aureus (BCID) NOT DETECTED NOT DETECTED Final   Streptococcus species NOT DETECTED NOT DETECTED Final   Streptococcus agalactiae NOT DETECTED NOT DETECTED Final   Streptococcus pneumoniae NOT DETECTED NOT DETECTED Final   Streptococcus pyogenes NOT DETECTED NOT DETECTED Final   Acinetobacter baumannii NOT DETECTED NOT DETECTED Final   Enterobacteriaceae species DETECTED (A) NOT DETECTED Final    Comment: Enterobacteriaceae represent a large family of gram-negative  bacteria, not a single organism. CRITICAL RESULT CALLED TO, READ BACK BY AND VERIFIED WITH: E PEREZ,PHARMD AT 1611 03/05/2019 BY L BENFIELD    Enterobacter cloacae complex NOT DETECTED NOT DETECTED Final   Escherichia coli DETECTED (A) NOT DETECTED Final    Comment: CRITICAL RESULT CALLED TO, READ BACK BY AND VERIFIED WITH: E PEREZ,PHARMD AT 1611 03/05/2019 BY L BENFIELD    Klebsiella oxytoca NOT DETECTED NOT DETECTED Final   Klebsiella pneumoniae NOT DETECTED NOT DETECTED Final   Proteus species NOT DETECTED NOT DETECTED Final   Serratia marcescens NOT DETECTED NOT DETECTED Final   Carbapenem resistance NOT DETECTED NOT DETECTED Final   Haemophilus influenzae NOT DETECTED NOT DETECTED Final   Neisseria meningitidis NOT DETECTED NOT DETECTED Final   Pseudomonas aeruginosa NOT DETECTED NOT DETECTED Final   Candida albicans NOT DETECTED NOT DETECTED Final   Candida glabrata NOT DETECTED NOT DETECTED Final   Candida krusei NOT DETECTED NOT DETECTED Final   Candida parapsilosis NOT DETECTED NOT DETECTED Final   Candida tropicalis NOT DETECTED NOT DETECTED Final    Comment: Performed at [facility], [place] [address], [place], [place] [address]  SARS Coronavirus 2 [facility] order, Performed in [facility] hospital lab) Nasopharyngeal Nasopharyngeal Swab     Status: None   Collection Time: 03/04/19  4:39 PM   Specimen: Nasopharyngeal Swab  Result Value Ref Range Status   SARS Coronavirus 2 NEGATIVE NEGATIVE Final    Comment: (NOTE) If result is NEGATIVE SARS-CoV-2 target nucleic acids are NOT DETECTED. The SARS-CoV-2 RNA is generally detectable in upper and lower  respiratory specimens during the acute phase of infection. The lowest  concentration of SARS-CoV-2 viral copies this assay can detect is 250  copies / mL. A negative result does not preclude SARS-CoV-2 infection  and should not be used as the sole basis for treatment or other  patient management decisions.  A negative result  may occur with  improper specimen collection / handling, submission of specimen other  than nasopharyngeal swab, presence of viral mutation(s) within the  areas targeted by this assay, and inadequate number of viral copies  (<250 copies / mL). A negative result must be combined with clinical  observations, patient history, and epidemiological information. If result is POSITIVE SARS-CoV-2 target nucleic acids are DETECTED. The SARS-CoV-2 RNA is generally detectable in upper and lower  respiratory specimens dur ing the acute phase of infection.  Positive  results are indicative of active infection with SARS-CoV-2.  Clinical  correlation with patient history and other diagnostic information is  necessary to determine patient infection status.  Positive results do  not rule out bacterial infection or co-infection with other viruses. If result is PRESUMPTIVE POSTIVE SARS-CoV-2 nucleic acids MAY BE PRESENT.   A presumptive positive result was obtained on the submitted specimen  and confirmed on repeat  testing.  While 2019 novel coronavirus  (SARS-CoV-2) nucleic acids may be present in the submitted sample  additional confirmatory testing may be necessary for epidemiological  and / or clinical management purposes  to differentiate between  SARS-CoV-2 and other Sarbecovirus currently known to infect humans.  If clinically indicated additional testing with an alternate test  methodology [id number]) is advised. The SARS-CoV-2 RNA is generally  detectable in upper and lower respiratory sp ecimens during the acute  phase of infection. The expected result is Negative. Fact Sheet for Patients:  StrictlyIdeas.no Fact Sheet for Healthcare Providers: BankingDealers.co.za This test is not yet approved or cleared by the [place] FDA and has been authorized for detection and/or diagnosis of SARS-CoV-2 by FDA under an Emergency Use Authorization (EUA).   This EUA will remain in effect (meaning this test can be used) for the duration of the COVID-19 declaration under Section 564(b)(1) of the Act, 21 U.S.C. section 360bbb-3(b)(1), unless the authorization is terminated or revoked sooner. Performed at [facility], [place] [address] [place], [place] [address]   Blood Culture (routine x 2)     Status: None   Collection Time: 03/05/19 12:26 AM   Specimen: BLOOD  Result Value Ref Range Status   Specimen Description BLOOD RIGHT ARM  Final   Special Requests   Final    BOTTLES DRAWN AEROBIC ONLY Blood Culture adequate volume   Culture   Final    NO GROWTH 5 DAYS Performed at [facility], [address], [place], [place] [address]    Report Status 03/10/2019 FINAL  Final     Labs: Basic Metabolic Panel: Recent Labs  Lab 03/06/19 1052 03/07/19 0447 03/08/19 0447 03/09/19 0804  NA 134* 135 139 138  K 4.0 3.6 3.3* 3.8  CL 105 110 108 107  CO2 19* 18* 23 22  GLUCOSE 103* 99 136* 138*  BUN 20 18 19 17   CREATININE 0.68 0.62 0.65 0.49  CALCIUM 8.6* 8.1* 8.2* 8.6*  MG  --   --  2.1  --   PHOS  --   --  1.5*  --    Liver Function Tests: Recent Labs  Lab 03/08/19 0447  AST 21  ALT 22  ALKPHOS 75  BILITOT 1.1  PROT 5.2*  ALBUMIN 2.0*   No results for input(s): LIPASE, AMYLASE in the last 168 hours. No results for input(s): AMMONIA in the last 168 hours. CBC: Recent Labs  Lab 03/06/19 1052 03/07/19 0447 03/08/19 0447  WBC 5.6 4.9 6.0  NEUTROABS  --   --  4.5  HGB 14.7 13.8 14.0  HCT 44.4 41.1 41.5  MCV 92.7 91.7 91.0  PLT 151 161 194   Cardiac Enzymes: No results for input(s): CKTOTAL, CKMB, CKMBINDEX, TROPONINI in the last 168 hours. BNP: BNP (last 3 results) No results for input(s): BNP in the last 8760 hours.  ProBNP (last 3 results) No results for input(s): PROBNP in the last 8760 hours.  CBG: Recent Labs  Lab 03/07/19 1623 03/07/19 2028 03/07/19 2359 03/08/19 0410 03/08/19 0725  GLUCAP 96  106* 117* 129* 128*       Signed:  Kayleen Memos, MD [facility] 03/12/2019, 10:06 AM

## 2019-03-20 ENCOUNTER — Telehealth: Payer: Self-pay | Admitting: *Deleted

## 2019-03-20 NOTE — Telephone Encounter (Signed)
Call received from patient's daughter, Kieth Brightly requesting Dr. Marin Olp call [facility] to adjust pt.'s pain medicine and requesting a call back from Dr. Marin Olp.  Call placed to [facility] at [facility] and she states that physician at [facility] is managing pt.'s pain medication and that Dr. Antonieta Pert assistance is not needed at this time. Dr. Marin Olp notified of pt.'s daughters concerns regarding pt.'s pain and states that he will see pt tomorrow morning.  Call placed back to patient's daughter, Kieth Brightly and notified her that Dr. Marin Olp would see patient in the morning.  Pt.'s daughter remains concerned that pt is not being medicated appropriately for her pain.  Instructed Kieth Brightly to call [employer] [facility] and speak with Arbie Cookey regarding pt.'s pain.  Kieth Brightly states that she will call [employer] now and speak with Arbie Cookey.

## 2019-03-26 ENCOUNTER — Telehealth: Payer: Self-pay | Admitting: *Deleted

## 2019-03-28 ENCOUNTER — Ambulatory Visit: Payer: 59 | Admitting: Family Medicine

## 2019-03-29 ENCOUNTER — Telehealth: Payer: Self-pay

## 2019-03-29 NOTE — Telephone Encounter (Signed)
Copied from [place] [id number]. Topic: General - Deceased Patient >> Apr 01, 2019  1:08 PM Yvette Rack wrote: Reason for CRM: Pt daughter Rolm Bookbinder stated pt passed away on 09-23-2022 03-29-2019 and she wanted to make Dr. Juleen China aware

## 2019-03-30 NOTE — Telephone Encounter (Signed)
FYI

## 2019-04-22 NOTE — Telephone Encounter (Signed)
Call received from Izora Gala at [facility] to inform Dr. Marin Olp that pt passed away today at 2:46PM.  Dr. Marin Olp notified.

## 2019-04-22 DEATH — deceased

## 2019-05-15 ENCOUNTER — Encounter: Payer: 59 | Admitting: Family Medicine
